# Patient Record
Sex: Male | Born: 1950
Health system: Southern US, Community
[De-identification: ages and names within clinical notes are randomized; demographics above are authoritative.]

## PROBLEM LIST (undated history)

## (undated) DIAGNOSIS — R011 Cardiac murmur, unspecified: Secondary | ICD-10-CM

## (undated) DIAGNOSIS — R0602 Shortness of breath: Secondary | ICD-10-CM

## (undated) DIAGNOSIS — K635 Polyp of colon: Secondary | ICD-10-CM

## (undated) DIAGNOSIS — I340 Nonrheumatic mitral (valve) insufficiency: Secondary | ICD-10-CM

## (undated) DIAGNOSIS — E079 Disorder of thyroid, unspecified: Secondary | ICD-10-CM

## (undated) DIAGNOSIS — D3613 Benign neoplasm of peripheral nerves and autonomic nervous system of lower limb, including hip: Secondary | ICD-10-CM

## (undated) DIAGNOSIS — R42 Dizziness and giddiness: Secondary | ICD-10-CM

## (undated) DIAGNOSIS — R7303 Prediabetes: Secondary | ICD-10-CM

## (undated) DIAGNOSIS — E785 Hyperlipidemia, unspecified: Secondary | ICD-10-CM

## (undated) DIAGNOSIS — T7840XA Allergy, unspecified, initial encounter: Secondary | ICD-10-CM

## (undated) DIAGNOSIS — I499 Cardiac arrhythmia, unspecified: Secondary | ICD-10-CM

## (undated) DIAGNOSIS — C44519 Basal cell carcinoma of skin of other part of trunk: Secondary | ICD-10-CM

## (undated) DIAGNOSIS — R55 Syncope and collapse: Secondary | ICD-10-CM

## (undated) DIAGNOSIS — I4510 Unspecified right bundle-branch block: Secondary | ICD-10-CM

## (undated) DIAGNOSIS — IMO0002 Reserved for concepts with insufficient information to code with codable children: Secondary | ICD-10-CM

## (undated) DIAGNOSIS — G43909 Migraine, unspecified, not intractable, without status migrainosus: Secondary | ICD-10-CM

## (undated) DIAGNOSIS — Z9889 Other specified postprocedural states: Secondary | ICD-10-CM

## (undated) DIAGNOSIS — R0789 Other chest pain: Secondary | ICD-10-CM

## (undated) DIAGNOSIS — I351 Nonrheumatic aortic (valve) insufficiency: Secondary | ICD-10-CM

## (undated) DIAGNOSIS — N400 Enlarged prostate without lower urinary tract symptoms: Secondary | ICD-10-CM

## (undated) DIAGNOSIS — R002 Palpitations: Secondary | ICD-10-CM

## (undated) DIAGNOSIS — I471 Supraventricular tachycardia: Secondary | ICD-10-CM

## (undated) DIAGNOSIS — I519 Heart disease, unspecified: Secondary | ICD-10-CM

## (undated) DIAGNOSIS — R9430 Abnormal result of cardiovascular function study, unspecified: Secondary | ICD-10-CM

## (undated) DIAGNOSIS — I639 Cerebral infarction, unspecified: Secondary | ICD-10-CM

## (undated) DIAGNOSIS — I2510 Atherosclerotic heart disease of native coronary artery without angina pectoris: Secondary | ICD-10-CM

## (undated) HISTORY — DX: Nonrheumatic mitral (valve) insufficiency: I34.0

## (undated) HISTORY — DX: Nonrheumatic aortic (valve) insufficiency: I35.1

## (undated) HISTORY — DX: Hyperlipidemia, unspecified: E78.5

## (undated) HISTORY — DX: Benign neoplasm of peripheral nerves and autonomic nervous system of lower limb, including hip: D36.13

## (undated) HISTORY — DX: Shortness of breath: R06.02

## (undated) HISTORY — DX: Unspecified right bundle-branch block: I45.10

## (undated) HISTORY — DX: Other specified postprocedural states: Z98.890

## (undated) HISTORY — DX: Dizziness and giddiness: R42

## (undated) HISTORY — DX: Abnormal result of cardiovascular function study, unspecified: R94.30

## (undated) HISTORY — DX: Cardiac murmur, unspecified: R01.1

## (undated) HISTORY — DX: Allergy, unspecified, initial encounter: T78.40XA

## (undated) HISTORY — DX: Basal cell carcinoma of skin of other part of trunk: C44.519

## (undated) HISTORY — PX: EYE SURGERY: SHX253

## (undated) HISTORY — DX: Reserved for concepts with insufficient information to code with codable children: IMO0002

## (undated) HISTORY — PX: JOINT REPLACEMENT: SHX530

## (undated) HISTORY — DX: Supraventricular tachycardia: I47.1

## (undated) HISTORY — DX: Migraine, unspecified, not intractable, without status migrainosus: G43.909

## (undated) HISTORY — PX: POLYPECTOMY: SHX149

## (undated) HISTORY — DX: Cerebral infarction, unspecified: I63.9

## (undated) HISTORY — DX: Polyp of colon: K63.5

## (undated) HISTORY — DX: Palpitations: R00.2

## (undated) HISTORY — PX: ORIF ELBOW FRACTURE: SUR928

## (undated) HISTORY — DX: Disorder of thyroid, unspecified: E07.9

## (undated) HISTORY — DX: Benign prostatic hyperplasia without lower urinary tract symptoms: N40.0

## (undated) HISTORY — DX: Other chest pain: R07.89

## (undated) HISTORY — PX: THYROIDECTOMY: SHX17

## (undated) HISTORY — DX: Heart disease, unspecified: I51.9

## (undated) HISTORY — DX: Syncope and collapse: R55

## (undated) HISTORY — PX: OTHER SURGICAL HISTORY: SHX169

## (undated) HISTORY — PX: THYROID SURGERY: SHX805

---
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
## 1996-06-19 ENCOUNTER — Encounter: Payer: Self-pay | Admitting: Family Medicine

## 1996-06-19 LAB — CONVERTED CEMR LAB: RBC count: 5.06 10*6/uL

## 1997-08-27 ENCOUNTER — Encounter: Payer: Self-pay | Admitting: Family Medicine

## 1999-09-04 HISTORY — PX: CORONARY ANGIOPLASTY: SHX604

## 1999-09-23 ENCOUNTER — Ambulatory Visit (HOSPITAL_COMMUNITY): Admission: RE | Admit: 1999-09-23 | Discharge: 1999-09-23 | Payer: Self-pay | Admitting: Cardiology

## 1999-10-12 ENCOUNTER — Ambulatory Visit (HOSPITAL_COMMUNITY): Admission: RE | Admit: 1999-10-12 | Discharge: 1999-10-12 | Payer: Self-pay | Admitting: Internal Medicine

## 2000-01-12 ENCOUNTER — Encounter: Payer: Self-pay | Admitting: Cardiothoracic Surgery

## 2000-01-16 ENCOUNTER — Inpatient Hospital Stay (HOSPITAL_COMMUNITY): Admission: RE | Admit: 2000-01-16 | Discharge: 2000-01-22 | Payer: Self-pay | Admitting: Cardiothoracic Surgery

## 2000-01-16 ENCOUNTER — Encounter: Payer: Self-pay | Admitting: Cardiothoracic Surgery

## 2000-01-16 ENCOUNTER — Encounter (INDEPENDENT_AMBULATORY_CARE_PROVIDER_SITE_OTHER): Payer: Self-pay | Admitting: *Deleted

## 2000-01-17 ENCOUNTER — Encounter: Payer: Self-pay | Admitting: Cardiothoracic Surgery

## 2000-01-18 ENCOUNTER — Encounter: Payer: Self-pay | Admitting: Cardiothoracic Surgery

## 2000-01-21 ENCOUNTER — Encounter: Payer: Self-pay | Admitting: Cardiothoracic Surgery

## 2000-01-22 HISTORY — PX: MITRAL VALVE REPAIR: SHX2039

## 2002-12-04 ENCOUNTER — Encounter: Payer: Self-pay | Admitting: Family Medicine

## 2002-12-04 LAB — CONVERTED CEMR LAB
Blood Glucose, Fasting: 95 mg/dL
RBC count: 5.46 10*6/uL
TSH: 1.78 microintl units/mL
WBC, blood: 6.6 10*3/uL

## 2002-12-19 ENCOUNTER — Ambulatory Visit (HOSPITAL_COMMUNITY): Admission: RE | Admit: 2002-12-19 | Discharge: 2002-12-19 | Payer: Self-pay | Admitting: *Deleted

## 2002-12-19 HISTORY — PX: CORONARY ANGIOPLASTY: SHX604

## 2003-02-19 ENCOUNTER — Encounter: Payer: Self-pay | Admitting: Family Medicine

## 2003-02-19 LAB — CONVERTED CEMR LAB
Blood Glucose, Fasting: 94 mg/dL
PSA: 2 ng/mL

## 2003-06-12 ENCOUNTER — Encounter: Payer: Self-pay | Admitting: Family Medicine

## 2003-06-12 LAB — CONVERTED CEMR LAB
RBC count: 5.31 10*6/uL
WBC, blood: 6.4 10*3/uL

## 2004-06-07 ENCOUNTER — Ambulatory Visit: Payer: Self-pay | Admitting: Cardiology

## 2004-06-14 ENCOUNTER — Ambulatory Visit: Payer: Self-pay | Admitting: Family Medicine

## 2004-06-14 LAB — CONVERTED CEMR LAB: PSA: 1.41 ng/mL

## 2004-07-11 ENCOUNTER — Ambulatory Visit: Payer: Self-pay | Admitting: Family Medicine

## 2004-07-20 ENCOUNTER — Ambulatory Visit: Payer: Self-pay | Admitting: Family Medicine

## 2004-10-01 ENCOUNTER — Emergency Department (HOSPITAL_COMMUNITY): Admission: EM | Admit: 2004-10-01 | Discharge: 2004-10-01 | Payer: Self-pay | Admitting: Emergency Medicine

## 2004-11-08 ENCOUNTER — Ambulatory Visit: Payer: Self-pay | Admitting: Family Medicine

## 2004-11-08 LAB — CONVERTED CEMR LAB
RBC count: 5.01 10*6/uL
WBC, blood: 5.6 10*3/uL

## 2004-11-10 ENCOUNTER — Ambulatory Visit: Payer: Self-pay | Admitting: Family Medicine

## 2004-11-14 ENCOUNTER — Ambulatory Visit: Payer: Self-pay | Admitting: Cardiology

## 2004-11-30 ENCOUNTER — Ambulatory Visit: Payer: Self-pay

## 2004-12-02 ENCOUNTER — Ambulatory Visit: Payer: Self-pay | Admitting: Family Medicine

## 2005-01-11 ENCOUNTER — Ambulatory Visit (HOSPITAL_COMMUNITY): Admission: RE | Admit: 2005-01-11 | Discharge: 2005-01-11 | Payer: Self-pay | Admitting: Surgery

## 2005-01-11 ENCOUNTER — Ambulatory Visit (HOSPITAL_BASED_OUTPATIENT_CLINIC_OR_DEPARTMENT_OTHER): Admission: RE | Admit: 2005-01-11 | Discharge: 2005-01-11 | Payer: Self-pay | Admitting: Surgery

## 2005-01-11 HISTORY — PX: HERNIA REPAIR: SHX51

## 2006-03-06 HISTORY — PX: COLONOSCOPY: SHX174

## 2006-04-26 ENCOUNTER — Ambulatory Visit: Payer: Self-pay | Admitting: Cardiology

## 2006-05-03 ENCOUNTER — Ambulatory Visit: Payer: Self-pay | Admitting: Family Medicine

## 2006-05-10 ENCOUNTER — Ambulatory Visit: Payer: Self-pay | Admitting: Family Medicine

## 2006-05-28 ENCOUNTER — Ambulatory Visit: Payer: Self-pay | Admitting: Family Medicine

## 2006-05-28 LAB — CONVERTED CEMR LAB
ALT: 35 units/L (ref 0–40)
AST: 25 units/L (ref 0–37)
Albumin: 4 g/dL (ref 3.5–5.2)
Alkaline Phosphatase: 49 units/L (ref 39–117)
BUN: 17 mg/dL (ref 6–23)
Basophils Absolute: 0 10*3/uL (ref 0.0–0.1)
Basophils Relative: 0.4 % (ref 0.0–1.0)
Bilirubin, Direct: 0.1 mg/dL (ref 0.0–0.3)
CO2: 29 meq/L (ref 19–32)
Calcium: 9.1 mg/dL (ref 8.4–10.5)
Chloride: 109 meq/L (ref 96–112)
Cholesterol: 134 mg/dL (ref 0–200)
Creatinine, Ser: 1.2 mg/dL (ref 0.4–1.5)
Eosinophils Absolute: 0.2 10*3/uL (ref 0.0–0.6)
Eosinophils Relative: 2.2 % (ref 0.0–5.0)
GFR calc Af Amer: 81 mL/min
GFR calc non Af Amer: 67 mL/min
Glucose, Bld: 91 mg/dL (ref 70–99)
HCT: 40.8 % (ref 39.0–52.0)
HDL: 34.7 mg/dL — ABNORMAL LOW (ref >39.0)
Hemoglobin: 13.9 g/dL (ref 13.0–17.0)
LDL Cholesterol: 77 mg/dL (ref 0–99)
Lymphocytes Relative: 25.3 % (ref 12.0–46.0)
MCHC: 34 g/dL (ref 30.0–36.0)
MCV: 80.1 fL (ref 78.0–100.0)
Monocytes Absolute: 0.7 10*3/uL (ref 0.2–0.7)
Monocytes Relative: 9.9 % (ref 3.0–11.0)
Neutro Abs: 4.3 10*3/uL (ref 1.4–7.7)
Neutrophils Relative %: 62.2 % (ref 43.0–77.0)
PSA: 1.91 ng/mL
PSA: 1.91 ng/mL (ref 0.10–4.00)
Platelets: 313 10*3/uL (ref 150–400)
Potassium: 4.8 meq/L (ref 3.5–5.1)
RBC count: 5.09 10*6/uL
RBC: 5.09 M/uL (ref 4.22–5.81)
RDW: 13.6 % (ref 11.5–14.6)
Sodium: 143 meq/L (ref 135–145)
TSH: 2.08 microintl units/mL
TSH: 2.08 microintl units/mL (ref 0.35–5.50)
Total Bilirubin: 0.7 mg/dL (ref 0.3–1.2)
Total CHOL/HDL Ratio: 3.9
Total Protein: 6.8 g/dL (ref 6.0–8.3)
Triglycerides: 112 mg/dL (ref 0–149)
VLDL: 22 mg/dL (ref 0–40)
WBC, blood: 6.9 10*3/uL
WBC: 6.9 10*3/uL (ref 4.5–10.5)

## 2006-05-30 ENCOUNTER — Ambulatory Visit: Payer: Self-pay | Admitting: Family Medicine

## 2006-06-13 ENCOUNTER — Ambulatory Visit: Payer: Self-pay | Admitting: Gastroenterology

## 2006-06-27 ENCOUNTER — Ambulatory Visit: Payer: Self-pay | Admitting: Gastroenterology

## 2006-07-02 LAB — HM COLONOSCOPY

## 2006-09-24 ENCOUNTER — Telehealth (INDEPENDENT_AMBULATORY_CARE_PROVIDER_SITE_OTHER): Payer: Self-pay | Admitting: *Deleted

## 2006-12-10 ENCOUNTER — Telehealth (INDEPENDENT_AMBULATORY_CARE_PROVIDER_SITE_OTHER): Payer: Self-pay | Admitting: *Deleted

## 2007-05-10 ENCOUNTER — Ambulatory Visit: Payer: Self-pay | Admitting: Family Medicine

## 2007-05-10 ENCOUNTER — Encounter: Payer: Self-pay | Admitting: Family Medicine

## 2007-05-10 DIAGNOSIS — N401 Enlarged prostate with lower urinary tract symptoms: Secondary | ICD-10-CM | POA: Insufficient documentation

## 2007-05-10 DIAGNOSIS — N400 Enlarged prostate without lower urinary tract symptoms: Secondary | ICD-10-CM

## 2007-06-13 ENCOUNTER — Ambulatory Visit: Payer: Self-pay | Admitting: Family Medicine

## 2007-07-23 ENCOUNTER — Ambulatory Visit: Payer: Self-pay | Admitting: Family Medicine

## 2007-08-20 ENCOUNTER — Telehealth: Payer: Self-pay | Admitting: Family Medicine

## 2008-03-06 HISTORY — PX: CARDIAC CATHETERIZATION: SHX172

## 2008-05-13 ENCOUNTER — Telehealth: Payer: Self-pay | Admitting: Family Medicine

## 2008-05-18 ENCOUNTER — Ambulatory Visit: Payer: Self-pay | Admitting: Family Medicine

## 2008-05-18 LAB — CONVERTED CEMR LAB
ALT: 23 units/L (ref 0–53)
Alkaline Phosphatase: 58 units/L (ref 39–117)
BUN: 17 mg/dL (ref 6–23)
Bilirubin, Direct: 0.1 mg/dL (ref 0.0–0.3)
CO2: 29 meq/L (ref 19–32)
Calcium: 8.9 mg/dL (ref 8.4–10.5)
Eosinophils Relative: 2.5 % (ref 0.0–5.0)
Glucose, Bld: 96 mg/dL (ref 70–99)
Hemoglobin: 13.4 g/dL (ref 13.0–17.0)
LDL Cholesterol: 77 mg/dL (ref 0–99)
Lymphocytes Relative: 30.4 % (ref 12.0–46.0)
Monocytes Relative: 9 % (ref 3.0–12.0)
Neutro Abs: 3.4 10*3/uL (ref 1.4–7.7)
Potassium: 4.2 meq/L (ref 3.5–5.1)
RDW: 12.9 % (ref 11.5–14.6)
Sodium: 143 meq/L (ref 135–145)
Total CHOL/HDL Ratio: 4.3
Total Protein: 6.5 g/dL (ref 6.0–8.3)
Triglycerides: 84 mg/dL (ref 0–149)
WBC: 5.7 10*3/uL (ref 4.5–10.5)

## 2008-05-20 ENCOUNTER — Ambulatory Visit: Payer: Self-pay | Admitting: Family Medicine

## 2008-07-28 ENCOUNTER — Telehealth: Payer: Self-pay | Admitting: Family Medicine

## 2008-09-02 ENCOUNTER — Telehealth: Payer: Self-pay | Admitting: Family Medicine

## 2008-09-10 ENCOUNTER — Telehealth: Payer: Self-pay | Admitting: Family Medicine

## 2008-12-13 ENCOUNTER — Encounter: Payer: Self-pay | Admitting: Cardiology

## 2008-12-13 DIAGNOSIS — E785 Hyperlipidemia, unspecified: Secondary | ICD-10-CM | POA: Insufficient documentation

## 2008-12-14 ENCOUNTER — Ambulatory Visit: Payer: Self-pay | Admitting: Cardiology

## 2008-12-14 ENCOUNTER — Ambulatory Visit: Payer: Self-pay | Admitting: Internal Medicine

## 2008-12-14 DIAGNOSIS — R42 Dizziness and giddiness: Secondary | ICD-10-CM | POA: Insufficient documentation

## 2008-12-14 DIAGNOSIS — R0600 Dyspnea, unspecified: Secondary | ICD-10-CM | POA: Insufficient documentation

## 2008-12-14 DIAGNOSIS — R0609 Other forms of dyspnea: Secondary | ICD-10-CM

## 2008-12-18 ENCOUNTER — Telehealth: Payer: Self-pay | Admitting: Cardiology

## 2008-12-24 ENCOUNTER — Encounter: Payer: Self-pay | Admitting: Cardiology

## 2008-12-24 ENCOUNTER — Ambulatory Visit (HOSPITAL_COMMUNITY): Admission: RE | Admit: 2008-12-24 | Discharge: 2008-12-24 | Payer: Self-pay | Admitting: Cardiology

## 2008-12-24 ENCOUNTER — Ambulatory Visit: Payer: Self-pay | Admitting: Cardiology

## 2008-12-24 ENCOUNTER — Ambulatory Visit: Payer: Self-pay

## 2009-01-11 ENCOUNTER — Ambulatory Visit: Payer: Self-pay | Admitting: Family Medicine

## 2009-01-15 ENCOUNTER — Ambulatory Visit: Payer: Self-pay | Admitting: Cardiology

## 2009-05-20 ENCOUNTER — Encounter (INDEPENDENT_AMBULATORY_CARE_PROVIDER_SITE_OTHER): Payer: Self-pay | Admitting: *Deleted

## 2009-05-25 ENCOUNTER — Telehealth: Payer: Self-pay | Admitting: Cardiology

## 2009-06-03 ENCOUNTER — Ambulatory Visit: Payer: Self-pay | Admitting: Family Medicine

## 2009-06-09 ENCOUNTER — Ambulatory Visit: Payer: Self-pay | Admitting: Family Medicine

## 2009-06-21 ENCOUNTER — Ambulatory Visit: Payer: Self-pay | Admitting: Cardiology

## 2009-06-23 ENCOUNTER — Telehealth (INDEPENDENT_AMBULATORY_CARE_PROVIDER_SITE_OTHER): Payer: Self-pay

## 2009-06-24 ENCOUNTER — Ambulatory Visit: Payer: Self-pay | Admitting: Internal Medicine

## 2009-06-24 ENCOUNTER — Encounter: Payer: Self-pay | Admitting: Cardiology

## 2009-06-24 ENCOUNTER — Ambulatory Visit: Payer: Self-pay

## 2009-06-24 ENCOUNTER — Encounter (HOSPITAL_COMMUNITY): Admission: RE | Admit: 2009-06-24 | Discharge: 2009-09-07 | Payer: Self-pay | Admitting: Cardiology

## 2009-07-01 ENCOUNTER — Telehealth: Payer: Self-pay | Admitting: Family Medicine

## 2009-07-04 DIAGNOSIS — R002 Palpitations: Secondary | ICD-10-CM

## 2009-07-04 HISTORY — DX: Palpitations: R00.2

## 2009-07-09 ENCOUNTER — Encounter: Payer: Self-pay | Admitting: Cardiology

## 2009-07-12 ENCOUNTER — Ambulatory Visit: Payer: Self-pay | Admitting: Cardiology

## 2009-07-12 ENCOUNTER — Telehealth: Payer: Self-pay | Admitting: Family Medicine

## 2009-07-12 LAB — CONVERTED CEMR LAB
Basophils Absolute: 0 10*3/uL (ref 0.0–0.1)
CO2: 29 meq/L (ref 19–32)
Calcium: 9.3 mg/dL (ref 8.4–10.5)
Creatinine, Ser: 1 mg/dL (ref 0.4–1.5)
Eosinophils Absolute: 0.1 10*3/uL (ref 0.0–0.7)
Glucose, Bld: 78 mg/dL (ref 70–99)
INR: 1 (ref 0.8–1.0)
Lymphocytes Relative: 25.8 % (ref 12.0–46.0)
MCHC: 33.9 g/dL (ref 30.0–36.0)
Neutro Abs: 3.7 10*3/uL (ref 1.4–7.7)
Neutrophils Relative %: 59.9 % (ref 43.0–77.0)
Platelets: 271 10*3/uL (ref 150.0–400.0)
RDW: 15.3 % — ABNORMAL HIGH (ref 11.5–14.6)

## 2009-07-15 ENCOUNTER — Ambulatory Visit: Payer: Self-pay | Admitting: Cardiovascular Disease

## 2009-07-15 ENCOUNTER — Inpatient Hospital Stay (HOSPITAL_BASED_OUTPATIENT_CLINIC_OR_DEPARTMENT_OTHER): Admission: RE | Admit: 2009-07-15 | Discharge: 2009-07-15 | Payer: Self-pay | Admitting: Cardiovascular Disease

## 2009-07-15 HISTORY — PX: OTHER SURGICAL HISTORY: SHX169

## 2009-07-30 ENCOUNTER — Encounter: Payer: Self-pay | Admitting: Cardiology

## 2009-08-03 ENCOUNTER — Ambulatory Visit: Payer: Self-pay | Admitting: Cardiology

## 2009-08-03 ENCOUNTER — Telehealth: Payer: Self-pay | Admitting: Family Medicine

## 2009-08-31 ENCOUNTER — Telehealth: Payer: Self-pay | Admitting: Family Medicine

## 2009-09-23 ENCOUNTER — Ambulatory Visit: Payer: Self-pay | Admitting: Cardiology

## 2009-09-27 ENCOUNTER — Telehealth: Payer: Self-pay | Admitting: Cardiology

## 2009-10-11 ENCOUNTER — Telehealth: Payer: Self-pay | Admitting: Family Medicine

## 2009-10-12 ENCOUNTER — Encounter (INDEPENDENT_AMBULATORY_CARE_PROVIDER_SITE_OTHER): Payer: Self-pay | Admitting: *Deleted

## 2009-11-24 ENCOUNTER — Ambulatory Visit: Payer: Self-pay | Admitting: Family Medicine

## 2009-11-24 DIAGNOSIS — M25559 Pain in unspecified hip: Secondary | ICD-10-CM | POA: Insufficient documentation

## 2010-04-05 NOTE — Miscellaneous (Signed)
  Clinical Lists Changes  Observations: Added new observation of PAST MED HX:  DYSLIPIDEMIA (ICD-272.4) * THYROID SURGERY * MITRAL VALVE REPAIR.  2001   ..good mitral valve function... echo.. October, 2010 EF 55%... echo... October, 2010  /  nuclear   April, 2011... EF 36%, ??? reliable ??? /  EF 45-50%... catheterization... rated 12, 2011 Positional dizziness... mild... no orthostatic changes by blood pressure check Shortness of breath * ANEURYSMAL DILATATION PROXIMAL LAD  catheterization... 2001  /  repeat catheter 2004  no significant change aneurysmal dilatation of the LAD /  catheterization.. Jul 15 2009... aneurysmal proximal LAD with no change... normal right heart filling pressures and LVEDP.... ejection fraction 45-50% MITRAL REGURGITATION (ICD-396.3) BENIGN PROSTATIC HYPERTROPHY, HX OF (ICD-V13.8) HYPERCHOLESTEROLEMIA (ICD-272.0) Chest discomfort   nuclear June 24, 2009.... suggestion prior small inferior MI with moderate peri-infarct ischemia versus variable diaphragmatic attenuation  (07/30/2009 13:32) Added new observation of PRIMARY MD: Joycelyn Man (07/30/2009 13:32)       Past History:  Past Medical History:  DYSLIPIDEMIA (ICD-272.4) * THYROID SURGERY * MITRAL VALVE REPAIR.  2001   ..good mitral valve function... echo.. October, 2010 EF 55%... echo... October, 2010  /  nuclear   April, 2011... EF 36%, ??? reliable ??? /  EF 45-50%... catheterization... rated 12, 2011 Positional dizziness... mild... no orthostatic changes by blood pressure check Shortness of breath * ANEURYSMAL DILATATION PROXIMAL LAD  catheterization... 2001  /  repeat catheter 2004  no significant change aneurysmal dilatation of the LAD /  catheterization.. Jul 15 2009... aneurysmal proximal LAD with no change... normal right heart filling pressures and LVEDP.... ejection fraction 45-50% MITRAL REGURGITATION (ICD-396.3) BENIGN PROSTATIC HYPERTROPHY, HX OF (ICD-V13.8) HYPERCHOLESTEROLEMIA  (ICD-272.0) Chest discomfort   nuclear June 24, 2009.... suggestion prior small inferior MI with moderate peri-infarct ischemia versus variable diaphragmatic attenuation

## 2010-04-05 NOTE — Letter (Signed)
Summary: Nadara Eaton letter  [place] at [facility]  [address] [place], [place] [address]   Phone: [phone]  Fax: [phone]       10/12/2009 MRN: [id number]  [facility] [address] [place], [place]  [address]  Dear Mr. Damien [facility]usi [facility] [place], and [facility] announce the retirement of Arta Silence, M.D., from full-time practice at the [facility] office effective September 02, 2009 and his plans of returning part-time.  It is important to Dr. Hetty Ely and to our practice that you understand that [facility] has seven physicians in our office for your health care needs.  We will continue to offer the same exceptional care that you have today.    Dr. Hetty Ely has spoken to many of you about his plans for retirement and returning part-time in the fall.   We will continue to work with you through the transition to schedule appointments for you in the office and meet the high standards that [place] is committed to.   Again, it is with great pleasure that we share the news that Dr. Hetty Ely will return to [facility] at [facility] in October of 2011 with a reduced schedule.    If you have any questions, or would like to request an appointment with one of our physicians, please call us at [phone] and press the option for Scheduling an appointment.  We take pleasure in providing you with excellent patient care and look forward to seeing you at your next office visit.  Our [facility] Physicians are:  Tillman Abide, M.D. Laurita Quint, M.D. Roxy Manns, M.D. Kerby Nora, M.D. Hannah Beat, M.D. Ruthe Mannan, M.D. We proudly welcomed Raechel Ache, M.D. and Eustaquio Boyden, M.D. to the practice in July/August 2011.  Sincerely,  [facility]

## 2010-04-05 NOTE — Assessment & Plan Note (Signed)
Summary: f23m      Allergies Added: NKDA  Visit Type:  Follow-up Primary Provider:  Joycelyn Man  CC:  chest pain.  History of Present Illness: The patient is seen for followup shortness of breath and some chest discomfort.  I saw him last in November, 2010.  He has history of mitral valve repair that was very successful and followup 2-D echo in October, 2010, revealed an ejection fraction of 55%.  His valve repair was working well.  Since that time he continues to have some mild shortness of breath and some mild chest discomfort with exertion.  He does have aneurysmal dilatation of the proximal LAD in the past.  Current Medications (verified): 1)  Vytorin 10-80 Mg Tabs (Ezetimibe-Simvastatin) .Marland Kitchen.. 1 At Bedtime Daily By Mouth 2)  Butalbital-Asa-Caffeine 50-325-40 Mg  Caps (Butalbital-Aspirin-Caffeine) .Marland Kitchen.. 1 By Mouth Every 6 Hours As Needed Headache 3)  Flomax 0.4 Mg  Cp24 (Tamsulosin Hcl) .Marland Kitchen.. 1 At Bedtime By Mouth Once Daily 4)  Proscar 5 Mg  Tabs (Finasteride) .... One Tab By Mouth Once Daily 5)  Diclofenac Sodium 75 Mg Tbec (Diclofenac Sodium) .Marland Kitchen.. 1 By Mouth Two Times A Day  Allergies (verified): No Known Drug Allergies  Past History:  Past Medical History:  DYSLIPIDEMIA (ICD-272.4) * THYROID SURGERY * MITRAL VALVE REPAIR...good mitral valve function... echo.. October, 2010 EF 55%... echo... October, 2010 Positional dizziness... mild... no orthostatic changes by blood pressure check Shortness of breath * ANEURYSMAL DILATATION PROXIMAL LAD MITRAL REGURGITATION (ICD-396.3) BENIGN PROSTATIC HYPERTROPHY, HX OF (ICD-V13.8) HYPERCHOLESTEROLEMIA (ICD-272.0) Chest discomfort  Review of Systems       Patient denies fever, chills, headache, sweats, change in vision, change in hearing, cough, nausea vomiting, urinary symptoms, musculoskeletal problems.  All other systems are reviewed and are negative.  Vital Signs:  Patient profile:   60 year old male Height:      71  inches Weight:      211 pounds BMI:     29.53 Pulse rate:   75 / minute BP sitting:   136 / 94  (left arm) Cuff size:   regular  Vitals Entered By: Hardin Negus, RMA (June 21, 2009 4:12 PM)  Physical Exam  General:  patient is stable today Head:  head is atraumatic. Eyes:  no xanthelasma. Neck:  no jugular venous distention. Chest Wall:  no chest wall tenderness. Lungs:  lungs are clear.  Respiratory effort is nonlabored. Heart:  cardiac exam reveals S1-S2.  There no clicks or significant murmurs. Abdomen:  abdomen is soft. Msk:  no musculoskeletal deformities. Extremities:  no peripheral edema. Skin:  no skin rashes. Psych:  patient is oriented to person time and place.  Affect is normal.   Impression & Recommendations:  Problem # 1:  CHEST DISCOMFORT (ICD-786.59)  The patient has had some mild chest discomfort.  He also has had some shortness of breath.  We know that his LV function is good and his mitral valve function is good from his recent echo.  It is now time to proceed with a stress study to be sure that there is no sign of significant ischemia.  This will be arranged.  Orders: Nuclear Stress Test (Nuc Stress Test)  Problem # 2:  PALPITATIONS (ICD-785.1) The patient has had some mild palpitations.  At the time of his stress test we will see if he has any stress-induced ectopy.  He does not need to wear a Holter monitor.  Problem # 3:  POSTURAL LIGHTHEADEDNESS (ICD-780.4) He has  not been having much of this problem.  No further workup.  Problem # 4:  * MITRAL VALVE REPAIR We know that his valve is working well.  No further workup now.  Patient Instructions: 1)  Your physician has requested that you have an exercise stress myoview.  For further information please visit [email or web address]  Please follow instruction sheet, as given. 2)  Follow up 3-4 weeks

## 2010-04-05 NOTE — Progress Notes (Signed)
Summary: set up for stress test   Phone Note Call from Patient Call back at Home Phone [phone] Call back at Work Phone [phone]   Caller: Patient Reason for Call: Talk to Nurse Summary of Call: pt is aware that he suppose to be set up for stress test in may. following his last office visit, Initial call taken by: Lorne Skeens,  May 25, 2009 9:29 AM  Follow-up for Phone Call        spoke w/pt, sch f/u appt w/Dr Myrtis Ser for 4/18  Follow-up by: Meredith Staggers, RN,  May 25, 2009 10:37 AM

## 2010-04-05 NOTE — Progress Notes (Signed)
Summary: Rx Flomax  Phone Note Refill Request Call back at [phone] Message from:  [facility] on October 11, 2009 5:26 PM  Refills Requested: Medication #1:  FLOMAX 0.4 MG  CP24 1 at bedtime by mouth once daily No last refill date sent   Method Requested: Electronic Initial call taken by: Sydell Axon LPN,  October 11, 2009 5:27 PM    Prescriptions: FLOMAX 0.4 MG  CP24 (TAMSULOSIN HCL) 1 at bedtime by mouth once daily  #90 x 3   Entered and Authorized by:   Crawford Givens MD   Signed by:   Crawford Givens MD on 10/11/2009   Method used:   Electronically to        [facility] (retail)       [address]       [place], [place]  [address]       Ph: [phone] or [phone]       Fax: [phone]   RxID:   [id number]

## 2010-04-05 NOTE — Progress Notes (Signed)
Summary: Rx Vytorin  Phone Note Refill Request Call back at [phone] Message from:  [facility] on Aug 03, 2009 12:41 PM  Refills Requested: Medication #1:  VYTORIN 10-80 MG TABS 1 at bedtime daily by mouth Last refill request on 06/30/09 message was sent to patient that he needed lab work and appt to be seen.  No appointments scheduled. Please advise.  Initial call taken by: Sydell Axon LPN,  Aug 03, 2009 12:42 PM    Prescriptions: VYTORIN 10-80 MG TABS (EZETIMIBE-SIMVASTATIN) 1 at bedtime daily by mouth  #30 Tablet x 0   Entered and Authorized by:   Shaune Leeks MD   Signed by:   Shaune Leeks MD on 08/03/2009   Method used:   [facility]        [facility] (retail)       [address]       [place], [place]  [address]       Ph: [phone] or [phone]       Fax: [phone]   RxID:   [id number]  Try same message one more time please. Shaune Leeks MD  Aug 03, 2009 1:05 PM   Spoke to pharmacist and requested that he advised the patient that he needs to call the office for a lab appt and appt to see the doctor. Pharmacist stated that he will put a note on the patient's rx.

## 2010-04-05 NOTE — Letter (Signed)
Summary: Cardiac Catheterization Instructions- [facility]  [facility], Main Office  [address]   [place], [place] [address]   Phone: [phone]  Fax: [phone]     07/12/2009 MRN: [id number]  [facility] [address] [place], [place]  [address]  Dear Mr. ABDALLAH,   You are scheduled for a Cardiac Catheterization on Thursday 07/15/09 with Dr. Excell Seltzer  Please arrive to the 1st floor of the Heart and Vascular Center at [facility] at 9:00 am / pm on the day of your procedure. Please do not arrive before 6:30 a.m. Call the Heart and Vascular Center at [phone] if you are unable to make your appointmnet. The Code to get into the parking garage under the building is 0010. Take the elevators to the 1st floor. You must have someone to drive you home. Someone must be with you for the first 24 hours after you arrive home. Please wear clothes that are easy to get on and off and wear slip-on shoes. Do not eat or drink after midnight except water with your medications that morning. Bring all your medications and current insurance cards with you.  ___ DO NOT take these medications before your procedure: ________________________________________________________________  ___ Make sure you take your aspirin.  _X__ You may take ALL of your medications with water that morning. ________________________________________________________________________________________________________________________________  ___ DO NOT take ANY medications before your procedure.  ___ Pre-med instructions:  ________________________________________________________________________________________________________________________________  The usual length of stay after your procedure is 2 to 3 hours. This can vary.  If you have any questions, please call the office at the number listed above.   Meredith Staggers, RN

## 2010-04-05 NOTE — Assessment & Plan Note (Signed)
Summary: [facility] F/U/SL  Medications Added DICLOFENAC SODIUM 75 MG TBEC (DICLOFENAC SODIUM) 1 by mouth two times a day as needed CARVEDILOL 6.25 MG TABS (CARVEDILOL) Take one tablet by mouth twice a day      Allergies Added: NKDA  Visit Type:  Follow-up Primary Provider:  Joycelyn Man  CC:  LV dysfunction.  History of Present Illness: The patient is seen for followup of palpitations and left ventricular dysfunction.  I saw him last Aug 03, 2009.  At that time he was feeling relatively well.  We had finished a very careful assessment of his coronaries in his LV function.  Catheterization had shown nonobstructive disease.  There was mild aneurysmal dilatation of the proximal LAD it was old.  For his palpitations I started him on a small dose of carvedilol.  He feels much better.  Current Medications (verified): 1)  Vytorin 10-80 Mg Tabs (Ezetimibe-Simvastatin) .Marland Kitchen.. 1 At Bedtime Daily By Mouth 2)  Butalbital-Asa-Caffeine 50-325-40 Mg  Caps (Butalbital-Aspirin-Caffeine) .Marland Kitchen.. 1 By Mouth Every 6 Hours As Needed Headache 3)  Flomax 0.4 Mg  Cp24 (Tamsulosin Hcl) .Marland Kitchen.. 1 At Bedtime By Mouth Once Daily 4)  Proscar 5 Mg  Tabs (Finasteride) .... One Tab By Mouth Once Daily 5)  Diclofenac Sodium 75 Mg Tbec (Diclofenac Sodium) .Marland Kitchen.. 1 By Mouth Two Times A Day As Needed 6)  Fish Oil   Oil (Fish Oil) .... Daily  (1200mg ) 7)  Red Yeast Inabinet   Powd (Red Yeast Uram Extract) .... Once Daily 8)  Carvedilol 3.125 Mg Tabs (Carvedilol) .... Take One Tablet By Mouth Twice A Day  Allergies (verified): No Known Drug Allergies  Past History:  Past Medical History:  DYSLIPIDEMIA (ICD-272.4) * THYROID SURGERY * MITRAL VALVE REPAIR.  2001   ..good mitral valve function... echo.. October, 2010 EF 55%... echo... October, 2010  /  nuclear   April, 2011... EF 36%, ??? reliable ??? /  EF 45-50%... catheterization... rated 12, 2011 Positional dizziness... mild... no orthostatic changes by blood pressure  check Shortness of breath * ANEURYSMAL DILATATION PROXIMAL LAD  catheterization... 2001  /  repeat catheter 2004  no significant change aneurysmal dilatation of the LAD /  catheterization.. Jul 15 2009... aneurysmal proximal LAD with no change... normal right heart filling pressures and LVEDP.... ejection fraction 45-50% MITRAL REGURGITATION (ICD-396.3) BENIGN PROSTATIC HYPERTROPHY, HX OF (ICD-V13.8) HYPERCHOLESTEROLEMIA (ICD-272.0) Chest discomfort   nuclear June 24, 2009.... suggestion prior small inferior MI with moderate peri-infarct ischemia versus variable diaphragmatic attenuation Palpitations    helped with carvedilol.... May, 2011  Review of Systems       Patient denies fever, chills, headache, sweats, rash, change in vision, change in hearing, chest pain, cough, nausea vomiting, urinary symptoms.  All other systems are reviewed and are negative  Vital Signs:  Patient profile:   60 year old male Height:      71 inches Weight:      210 pounds BMI:     29.39 Pulse rate:   74 / minute BP sitting:   108 / 64  (left arm) Cuff size:   regular  Vitals Entered By: Hardin Negus, RMA (September 23, 2009 9:13 AM)  Physical Exam  General:  patient is quite stable today. Eyes:  no xanthelasma. Neck:  no jugular venous distention. Lungs:  lungs are clear respiratory effort is nonlabored. Heart:  cardiac exam reveals S1-S2 present no clicks or significant murmurs Abdomen:  abdomen is soft. Extremities:  no peripheral edema. Psych:  patient is oriented  to person time and place.  Affect is normal.   Impression & Recommendations:  Problem # 1:  * EF 45-50% Carvedilol has been started.  Will make a small increased to 6.25 b.i.d.  His rate is relatively slow already.  I plan not to try to push him up to 25 b.i.d.  Over time I will consider whether it is appropriate to add an ACE inhibitor or not.  Problem # 2:  CHEST DISCOMFORT (ICD-786.59) He's had no recurrent chest pain.  No further  workup.  Problem # 3:  PALPITATIONS (ICD-785.1) His palpitations are definitely helped with a small dose of carvedilol.  This is to be continued.  Problem # 4:  SHORTNESS OF BREATH (ICD-786.05) Shortness of breath is improved.  No further workup.   Patient Instructions: 1)  Increase Carvedilol to 6.25mg  two times a day  2)  Your physician wants you to follow-up in:  6 months.  You will receive a reminder letter in the mail two months in advance. If you don't receive a letter, please call our office to schedule the follow-up appointment. Prescriptions: CARVEDILOL 6.25 MG TABS (CARVEDILOL) Take one tablet by mouth twice a day  #30 x 6   Entered by:   Meredith Staggers, RN   Authorized by:   Talitha Givens, MD, [facility]   Signed by:   Meredith Staggers, RN on 09/23/2009   Method used:   [facility]        [facility] (retail)       [address]       [place], [place]  [address]       Ph: [phone] or [phone]       Fax: [phone]   RxID:   [id number]

## 2010-04-05 NOTE — Miscellaneous (Signed)
  Clinical Lists Changes  Observations: Added new observation of PAST MED HX:  DYSLIPIDEMIA (ICD-272.4) * THYROID SURGERY * MITRAL VALVE REPAIR...good mitral valve function... echo.. October, 2010 EF 55%... echo... October, 2010  /  nuclear   April, 2011... EF 36%, ??? reliable ??? Positional dizziness... mild... no orthostatic changes by blood pressure check Shortness of breath * ANEURYSMAL DILATATION PROXIMAL LAD MITRAL REGURGITATION (ICD-396.3) BENIGN PROSTATIC HYPERTROPHY, HX OF (ICD-V13.8) HYPERCHOLESTEROLEMIA (ICD-272.0) Chest discomfort   nuclear June 24, 2009.... suggestion prior small inferior MI with moderate peri-infarct ischemia versus variable diaphragmatic attenuation  (07/09/2009 9:52) Added new observation of PRIMARY MD: Joycelyn Man (07/09/2009 9:52)       Past History:  Past Medical History:  DYSLIPIDEMIA (ICD-272.4) * THYROID SURGERY * MITRAL VALVE REPAIR...good mitral valve function... echo.. October, 2010 EF 55%... echo... October, 2010  /  nuclear   April, 2011... EF 36%, ??? reliable ??? Positional dizziness... mild... no orthostatic changes by blood pressure check Shortness of breath * ANEURYSMAL DILATATION PROXIMAL LAD MITRAL REGURGITATION (ICD-396.3) BENIGN PROSTATIC HYPERTROPHY, HX OF (ICD-V13.8) HYPERCHOLESTEROLEMIA (ICD-272.0) Chest discomfort   nuclear June 24, 2009.... suggestion prior small inferior MI with moderate peri-infarct ischemia versus variable diaphragmatic attenuation

## 2010-04-05 NOTE — Assessment & Plan Note (Signed)
Summary: 3wk f/u sl  Medications Added DICLOFENAC SODIUM 75 MG TBEC (DICLOFENAC SODIUM) 1 by mouth two times a day as needed CARVEDILOL 3.125 MG TABS (CARVEDILOL) Take one tablet by mouth twice a day      Allergies Added: NKDA  Visit Type:  Follow-up Primary Provider:  Joycelyn Man  CC:  LV dysfunction.  History of Present Illness: The patient is seen for followup.  He is status post mitral valve repair in the past.  He has some aneurysmal dilatation of his LAD.  He said some chest discomfort and fatigue.  Ejection fraction had been estimated in the 50% range.  With his recent nuclear scan there was question of a lower ejection fraction.  With all issues cardiac catheterization was arranged.  Study was done Jul 15, 2009.  There was aneurysmal dilatation it was mild of the proximal LAD.  It was unchanged in the past.  Right heart catheter showed normal right heart filling pressures and LVEDP.  His ejection fraction appeared to be in the 45-50% range.  Patient is feeling relatively well at this time.  I have reviewed completely the catheter report.  I had spoken previously with Dr. Excell Seltzer about this.  I explained the issue of mild left ventricular dysfunction the patient.  He says that with exercise he does feel some rare palpitations.  He had mentioned this in the past. At the time of his nuclear scan he had some PVCs.  Current Medications (verified): 1)  Vytorin 10-80 Mg Tabs (Ezetimibe-Simvastatin) .Marland Kitchen.. 1 At Bedtime Daily By Mouth 2)  Butalbital-Asa-Caffeine 50-325-40 Mg  Caps (Butalbital-Aspirin-Caffeine) .Marland Kitchen.. 1 By Mouth Every 6 Hours As Needed Headache 3)  Flomax 0.4 Mg  Cp24 (Tamsulosin Hcl) .Marland Kitchen.. 1 At Bedtime By Mouth Once Daily 4)  Proscar 5 Mg  Tabs (Finasteride) .... One Tab By Mouth Once Daily 5)  Diclofenac Sodium 75 Mg Tbec (Diclofenac Sodium) .Marland Kitchen.. 1 By Mouth Two Times A Day As Needed 6)  Fish Oil   Oil (Fish Oil) .... Daily  (1200mg ) 7)  Red Yeast Face   Powd (Red Yeast  Senger Extract) .... Once Daily  Allergies (verified): No Known Drug Allergies  Past History:  Past Medical History:  DYSLIPIDEMIA (ICD-272.4) * THYROID SURGERY * MITRAL VALVE REPAIR.  2001   ..good mitral valve function... echo.. October, 2010 EF 55%... echo... October, 2010  /  nuclear   April, 2011... EF 36%, ??? reliable ??? /  EF 45-50%... catheterization... rated 12, 2011 Positional dizziness... mild... no orthostatic changes by blood pressure check Shortness of breath * ANEURYSMAL DILATATION PROXIMAL LAD  catheterization... 2001  /  repeat catheter 2004  no significant change aneurysmal dilatation of the LAD /  catheterization.. Jul 15 2009... aneurysmal proximal LAD with no change... normal right heart filling pressures and LVEDP.... ejection fraction 45-50% MITRAL REGURGITATION (ICD-396.3) BENIGN PROSTATIC HYPERTROPHY, HX OF (ICD-V13.8) HYPERCHOLESTEROLEMIA (ICD-272.0) Chest discomfort   nuclear June 24, 2009.... suggestion prior small inferior MI with moderate peri-infarct ischemia versus variable diaphragmatic attenuation Palpitations   Review of Systems       Patient denies fever, chills, headache, sweats, rash, change in vision, change in hearing, chest pain, cough, nausea vomiting, urinary symptoms.  All other systems are reviewed and are negative.  Vital Signs:  Patient profile:   60 year old male Weight:      212 pounds Pulse rate:   64 / minute BP sitting:   120 / 66  (left arm)  Vitals Entered By: Meredith Staggers,  RN (Aug 03, 2009 8:33 AM)  Physical Exam  General:  patient is stable. Head:  head is atraumatic. Eyes:  no xanthelasma. Neck:  no jugular venous distention. Chest Wall:  no chest wall tenderness. Lungs:  lungs are clear.  Respiratory effort is nonlabored. Heart:  cardiac exam reveals S1 and S2.  No clicks or significant murmurs. Abdomen:  abdomen soft. Msk:  no musculoskeletal deformities. Extremities:  no peripheral edema. Skin:  no skin  rashes. Psych:  patient is oriented to person time and place.  Affect is normal.   Impression & Recommendations:  Problem # 1:  CHEST DISCOMFORT (ICD-786.59)  The patient has not had any further chest discomfort.  Cardiac catheterization has shown no obstructive disease.  He does have some mild aneurysmal dilatation of his proximal LAD that is old.  He does not need further ischemic workup.  His updated medication list for this problem includes:    Carvedilol 3.125 Mg Tabs (Carvedilol) .Marland Kitchen... Take one tablet by mouth twice a day  Problem # 2:  PALPITATIONS (ICD-785.1)  We know from exercise test the patient had some rare PVCs.  He notes some mild palpitations with exercise.  I will be starting a beta blocker for his LV dysfunction.  This may help with this issue.  His updated medication list for this problem includes:    Carvedilol 3.125 Mg Tabs (Carvedilol) .Marland Kitchen... Take one tablet by mouth twice a day  Problem # 3:  SHORTNESS OF BREATH (ICD-786.05)  Is not having any significant shortness of breath at this time.  No further workup.  His updated medication list for this problem includes:    Carvedilol 3.125 Mg Tabs (Carvedilol) .Marland Kitchen... Take one tablet by mouth twice a day  Problem # 4:  * MITRAL VALVE REPAIR Mitral valve repair continues to work well.  No further workup now.  Problem # 5:  * ANEURYSMAL DILATATION PROXIMAL LAD This issue is unchanged as noted from his recent catheter data.  No further workup.  Problem # 6:  * EF 45-50% As outlined in the work up recently there has been very a shunt in the assessment of his LV function.  Considering all that I will believe that his EF is in the range of 45-50%.  I have reviewed this with him and discuss the issues of medications for left ventricular dysfunction.  He really has only slight decrease in LV function.  I would like to have him on a low-dose of beta blocker if possible.  We will start him on carvedilol 3.125 b.i.d.  It is  possible that this may help him with some of his exercise PVCs.  Because he's had some fatigue recently we'll be sure not to make his fatigue worse with beta blockade.  I will see him for followup.  Patient Instructions: 1)  Carvedilol 3.125mg  two times a day  2)  Follow up in 8 weeks Prescriptions: CARVEDILOL 3.125 MG TABS (CARVEDILOL) Take one tablet by mouth twice a day  #60 x 6   Entered by:   Meredith Staggers, RN   Authorized by:   Talitha Givens, MD, [facility]   Signed by:   Meredith Staggers, RN on 08/03/2009   Method used:   [facility]        [facility] [facility]ail)       [address]       [place], [place]  [address]       Ph: [phone] or [phone]  Fax: [phone]   RxID:   [id number]

## 2010-04-05 NOTE — Progress Notes (Signed)
Summary: Nuc. Pre-Procedure  Phone Note Outgoing Call Call back at [facility] Phone [phone]   Call placed by: Irean Hong, RN,  June 23, 2009 3:49 PM Summary of Call: Left message with information on My[employer] Information Sheet (see scanned document for details).      Nuclear Med Background Indications for Stress Test: Evaluation for Ischemia   History: Echo, Heart Catheterization, Myocardial Perfusion Study, MVP  History Comments: 11/01 MVR with anuloplasty ring. 10/04 MPS>? some inferior ischemia, EF=45%> cath: no significant CAD, EF=40-45%, Mild pulmonary hypertension. 10/10 Echo: EF=55%, mild LVH.  Symptoms: Chest Pain with Exertion, Dizziness, DOE, Light-Headedness, Palpitations, SOB    Nuclear Pre-Procedure Cardiac Risk Factors: Family History - CAD, Lipids Height (in): 71

## 2010-04-05 NOTE — Progress Notes (Signed)
Summary: Rx Proscar  Phone Note Refill Request Call back at [phone] Message [facility]:  [facility] on August 31, 2009 7:54 AM  Refills Requested: Medication #1:  PROSCAR 5 MG  TABS one tab by mouth once daily  Method Requested: Electronic Initial call taken by: Sydell Axon LPN,  August 31, 2009 7:55 AM    Prescriptions: PROSCAR 5 MG  TABS (FINASTERIDE) one tab by mouth once daily  #30 Tablet x 12   Entered and Authorized by:   Shaune Leeks MD   Signed by:   Shaune Leeks MD on 08/31/2009   Method used:   [facility]        [facility] (retail)       [address]       [place], [place]  [address]       Ph: [phone] or [phone]       Fax: [phone]   RxID:   [id number]

## 2010-04-05 NOTE — Assessment & Plan Note (Signed)
Summary: 3wk f/u sl  Medications Added FISH OIL   OIL (FISH OIL) daily  (1200mg ) RED YEAST [place]   POWD (RED YEAST [place] EXTRACT) once daily      Allergies Added: NKDA  Visit Type:  Follow-up Primary Provider:  Joycelyn Man  CC:  CAD / shortness of breath.  History of Present Illness: Patient is seen for followup CAD and shortness of breath. He underwent mitral valve repair including a mitral ring in 2001.  He's done very well.  His recent echo revealed very good function of his valve in October, 2010.  He's had some chest discomfort and some shortness of breath.  The echo in October, 2010 revealed an ejection fraction of 55% and the mitral valve moved well.  He had a stress nuclear scan June 24, 2009. the study suggested the possibility of a small inferior MI with peri-infarct ischemia.  It is possible that this could also have been variable diaphragmatic attenuation.  The patient continues to have some vague chest discomfort and shortness of breath.  He is concerned about this.  The nuclear study also raise the question of ejection fraction 36%.  This does not fit with the echo of October, 2010.  Current Medications (verified): 1)  Vytorin 10-80 Mg Tabs (Ezetimibe-Simvastatin) .Marland Kitchen.. 1 At Bedtime Daily By Mouth 2)  Butalbital-Asa-Caffeine 50-325-40 Mg  Caps (Butalbital-Aspirin-Caffeine) .Marland Kitchen.. 1 By Mouth Every 6 Hours As Needed Headache 3)  Flomax 0.4 Mg  Cp24 (Tamsulosin Hcl) .Marland Kitchen.. 1 At Bedtime By Mouth Once Daily 4)  Proscar 5 Mg  Tabs (Finasteride) .... One Tab By Mouth Once Daily 5)  Diclofenac Sodium 75 Mg Tbec (Diclofenac Sodium) .Marland Kitchen.. 1 By Mouth Two Times A Day 6)  Fish Oil   Oil (Fish Oil) .... Daily  (1200mg ) 7)  Red Yeast [place]   Powd (Red Yeast Strain Extract) .... Once Daily  Allergies (verified): No Known Drug Allergies  Past History:  Past Medical History:  DYSLIPIDEMIA (ICD-272.4) * THYROID SURGERY * MITRAL VALVE REPAIR.  2001   ..good mitral valve function... echo..  October, 2010 EF 55%... echo... October, 2010  /  nuclear   April, 2011... EF 36%, ??? reliable ??? Positional dizziness... mild... no orthostatic changes by blood pressure check Shortness of breath * ANEURYSMAL DILATATION PROXIMAL LAD  catheterization... 2001  /  repeat catheter 2004  no significant change aneurysmal dilatation of the LAD MITRAL REGURGITATION (ICD-396.3) BENIGN PROSTATIC HYPERTROPHY, HX OF (ICD-V13.8) HYPERCHOLESTEROLEMIA (ICD-272.0) Chest discomfort   nuclear June 24, 2009.... suggestion prior small inferior MI with moderate peri-infarct ischemia versus variable diaphragmatic attenuation  Review of Systems       Patient denies fever, chills, headache, sweats, rash, change in vision, change in hearing, chest pain, cough, nausea vomiting, urinary symptoms.  All other systems are reviewed and are negative.  Vital Signs:  Patient profile:   60 year old male Height:      71 inches Weight:      211 pounds BMI:     29.53 Pulse rate:   65 / minute BP sitting:   138 / 66  (left arm) Cuff size:   regular  Vitals Entered By: Hardin Negus, RMA (Jul 12, 2009 8:35 AM)  Physical Exam  General:  patient is stable today. Head:  head is atraumatic. Eyes:  no xanthelasma. Neck:  no jugular venous distention. Chest Wall:  no chest wall tenderness. Lungs:  lungs are clear.  Respiratory effort is nonlabored. Heart:  cardiac exam reveals S1 and  S2.  There no clicks or significant murmurs. Abdomen:  abdomen is soft. Msk:  no musculoskeletal deformities. Extremities:  no peripheral edema. Skin:  no skin rashes. Psych:  patient is oriented to person time and place.  Affect is normal.   Impression & Recommendations:  Problem # 1:  PALPITATIONS (ICD-785.1) The patient is not having significant palpitations at this time.  Problem # 2:  POSTURAL LIGHTHEADEDNESS (ICD-780.4) This problem is under control.  No significant symptoms.  Problem # 3:  * MITRAL VALVE REPAIR We know  from the echo of October, 2010, and the patient's mitral valve is working well.  No further workup of the valve as needed.  Problem # 4:  * ANEURYSMAL DILATATION PROXIMAL LAD There is history of aneurysmal dilatation of the LAD.  As outlined this had been restudied in 2004 and had not changed.  We will obtain more information from his upcoming catheter.  Problem # 5:  SHORTNESS OF BREATH (ICD-786.05) With ongoing mild shortness of breath and some mild chest discomfort nuclear study was done.  There is question of ischemia in the inferior wall.  In addition we know he had old aneurysmal dilatation of the LAD.  There is also question of decreased ejection fraction from the nuclear scan.  Considering all this information, outpatient cardiac catheterization will be arranged. At the time of his echo in October, 2010, there was no significant tricuspid regurgitation.  Therefore right heart pressures could not be estimated.  Because shortness of breath as part of the issue the patient will also have a right heart catheter at the time of his last heart catheter and coronaries.  I had a careful discussion with the patient about this approach.  I also reviewed many of his old records.  Other Orders: TLB-BMP (Basic Metabolic Panel-BMET) (80048-METABOL) TLB-CBC Platelet - w/Differential (85025-CBCD) TLB-PT (Protime) (85610-PTP) Cardiac Catheterization (Cardiac Cath)  Patient Instructions: 1)  Labs today 2)  Your physician has requested that you have a cardiac catheterization.  Cardiac catheterization is used to diagnose and/or treat various heart conditions. Doctors may recommend this procedure for a number of different reasons. The most common reason is to evaluate chest pain. Chest pain can be a symptom of coronary artery disease (CAD), and cardiac catheterization can show whether plaque is narrowing or blocking your heart's arteries. This procedure is also used to evaluate the valves, as well as measure the  blood flow and oxygen levels in different parts of your heart.  For further information please visit [email or web address]  Please follow instruction sheet, as given. 3)  Follow up in 3 weeks

## 2010-04-05 NOTE — Progress Notes (Signed)
Summary: regarding flomax  Phone Note From Pharmacy   [facility] [id number] Summary of Call: Pharmacy states they are unable to get brand name flomax and are asking if they can dispense generic.  Please advise. Initial call taken by: Lowella Petties CMA,  July 01, 2009 3:28 PM  Follow-up for Phone Call        OK with me. Pls let pt know. Follow-up by: Shaune Leeks MD,  July 01, 2009 3:55 PM  Additional Follow-up for Phone Call Additional follow up Details #1::        Advised pharmacist, he will call the patient. Additional Follow-up by: Lowella Petties CMA,  July 01, 2009 5:01 PM

## 2010-04-05 NOTE — Assessment & Plan Note (Signed)
Summary: PER DR SCHALLER BUTTOCK PAIN/OK PER DR Guneet Delpino/[facility]   Vital Signs:  Patient profile:   60 year old male Height:      71 inches Weight:      212.2 pounds BMI:     29.70 Temp:     97.9 degrees F oral Pulse rate:   80 / minute Pulse rhythm:   regular BP sitting:   140 / 80  (left arm) Cuff size:   regular  Vitals Entered By: Benny Lennert CMA Duncan Dull) (June 09, 2009 3:44 PM)  History of Present Illness: Chief complaint pain in buttocls per dr schaller  60 year old with left hip pain ongoing for the last few weeks:  Some pain in the lateral buttocks and some pain in the anterior hip rarely. no radiculopathy no back pain no groin pain  Ice pack not helping.  Has done nothing that he can think of.  Has been working for the last twenty or so years, has been driving his own car.  Driving a pontiac vibe with a bucket seat  Taking ibuprofen 800 mg by mouth three times a day      Allergies (verified): No Known Drug Allergies  Past History:  Past medical, surgical, family and social histories (including risk factors) reviewed, and no changes noted (except as noted below).  Past Medical History: Reviewed history from 01/15/2009 and no changes required.  DYSLIPIDEMIA (ICD-272.4) * THYROID SURGERY * MITRAL VALVE REPAIR...good mitral valve function... echo.. October, 2010 EF 55%... echo... October, 2010 Positional dizziness... mild... no orthostatic changes by blood pressure check Shortness of breath * ANEURYSMAL DILATATION PROXIMAL LAD MITRAL REGURGITATION (ICD-396.3) BENIGN PROSTATIC HYPERTROPHY, HX OF (ICD-V13.8) HYPERCHOLESTEROLEMIA (ICD-272.0)  Past Surgical History: Reviewed history from 05/20/2008 and no changes required. L ELBOW ORIF 1965 APPENDECTOMY 1970 L THYROIDECTOMY DUE TO GOITER 1994 STRESS CARDIOLITE-- MILD INFEROLATERAL ISCHEMIA:(09/15/1999) ECHO-- MVP ; MILD AI :(09/1999) CATH. SEVERE MR O/W WITHIN NORMAL LIMITS:(09/1999) TEE MITRAL VALVUE  ANGIOPLASTY:(01/22/2000) CATH . + CAD , MILD LV DYSFUN. -- MED. TREATMENT:(12/19/2002) STTRESS CARDIOLITE -- INF ISCHEMIA--EVALUATION:(12/12/2002) ECHO EF 55% :(12/11/2002) ECHO NO CHANGE EF NORMAL  ; TR; A.R.; T.R.:(11/30/2004) LEFT HERNIA REPAIR DR. MARTIN:(01/11/2005) COLONOSCOPY POLYPS--(07/02/2006) REPEAT IN 10 YEARS  Family History: Reviewed history from 05/20/2008 and no changes required. Father:ALIVE  87  LEAKING MITRAL VALVE  ARTHRITIS BPH Mother: DECEASED AT 43 YOA - ALZHEIMERS; CHF BROTHER  A 54 Heaqrt Murmur CV: +AUNT DECEASED FROM MI HBP: NEGATIVE DM: NEGATIVE GOUT/ARTHRITIS: + FATHER ARTHRITIS PROSTATE CANCER: POSITIVE GF AT 55 YOA BREAT/OVARIAN/UTERINE CANCER:  COLON CANCER NEGATIVE // UNCLE LUNG CANCER:- SMOKER DEPRESSION: NEGATIVE ETOH/DRUG ABUSE: NEGATIVE OTHER: NEGATIVE STROKE  Social History: Reviewed history from 05/20/2008 and no changes required. Marital Status: Married  WIDOWED WIFE DECEASED COLON CANCER:  08-04-96//REMARRIED 1999-08-05 lives w/ remarried wife Children: 2 CHILDREN Occupation: Printmaker  Review of Systems       REVIEW OF SYSTEMS  GEN: No systemic complaints, no fevers, chills, sweats, or other acute illnesses MSK: Detailed in the HPI GI: tolerating PO intake without difficulty Neuro: No numbness, parasthesias, or tingling associated. Otherwise the pertinent positives of the ROS are noted above.    Physical Exam  General:  GEN: Well-developed,well-nourished,in no acute distress; alert,appropriate and cooperative throughout examination HEENT: Normocephalic and atraumatic without obvious abnormalities. No apparent alopecia or balding. Ears, externally no deformities PULM: Breathing comfortably in no respiratory distress EXT: No clubbing, cyanosis, or edema PSYCH: Normally interactive. Cooperative during the interview. Pleasant. Friendly and conversant. Not anxious  or depressed appearing. Normal, full affect.  Msk:  HIP EXAM: SIDE:  L ROM: Abduction, Flexion, Internal and External range of motion: Full Pain with terminal IROM and EROM: no GTB: NT SLR: NEG Knees: No effusion FABER: NT REVERSE FABER: POSITIVE ON THE LEFT Piriformis: TENDER, AND TENDER JUST CAUDAL AROUND GLUTE MINIMUS Str: flexion: 5/5 abduction: 5/5 adduction: 5/5 Strength testing non-tender    Impression & Recommendations:  Problem # 1:  OTHER DISORDER OF COCCYX/ LEFT BUTTOCK (ICD-724.79) Excellent ROM in both hips, no terminal ROM pain, negative "c sign", no groin pain - do not think intraarticular.  Focuses to the piriformis L, and glute minimus   I reviewed the relevent anatomy with the patient using [employer]'s Orthopaedic Atlas of Advice worker. The patient was given a handout from Dr. Ailene Ards book "The Sports Medicine Patient Advisor" describing the anatomy and rehabilitation of the following condition: Piriformis Syndrome  Also given a handout with more extensive stretching, hip flexor and abductor strengthening, ham stretching, hip turn outs  Rec deep massage, explained self-massage with ball   cc: Dr. Hetty Ely  Complete Medication List: 1)  Vytorin 10-80 Mg Tabs (Ezetimibe-simvastatin) .Marland Kitchen.. 1 at bedtime daily by mouth 2)  Butalbital-asa-caffeine 50-325-40 Mg Caps (Butalbital-aspirin-caffeine) .Marland Kitchen.. 1 by mouth every 6 hours as needed headache 3)  Flomax 0.4 Mg Cp24 (Tamsulosin hcl) .Marland Kitchen.. 1 at bedtime by mouth once daily 4)  Proscar 5 Mg Tabs (Finasteride) .... One tab by mouth once daily 5)  Diclofenac Sodium 75 Mg Tbec (Diclofenac sodium) .Marland Kitchen.. 1 by mouth two times a day  Patient Instructions: 1)  f/u if no improvement in a month Prescriptions: DICLOFENAC SODIUM 75 MG TBEC (DICLOFENAC SODIUM) 1 by mouth two times a day  #60 x 1   Entered and Authorized by:   Hannah Beat MD   Signed by:   Hannah Beat MD on 06/09/2009   Method used:   [facility]        [facility] (retail)       [address]       [place], [place]  [address]       Ph: [phone] or [phone]       Fax: [phone]   RxID:   [id number]   Current Allergies (reviewed today): No known allergies

## 2010-04-05 NOTE — Progress Notes (Signed)
Summary: med was doubled last week-feels dizzy   Phone Note Call from Patient   Reason for Call: Talk to Nurse Summary of Call: pt's carvedilol was doubled and was very dizzy on sunday-can he go back to half the dosage? pls call [phone] Initial call taken by: Glynda Jaeger,  September 27, 2009 8:18 AM  Follow-up for Phone Call        PT C/O DIZZINESS ON SUN ONLY TOOK ONE COREG IN THE PM OF 3.125 MG  INSTRUCTED  TO CONT  WITH 3.125 MG two times a day AND TO KEEP B/P AND HR LOG AND TO CALL OFFICE  IN 7-10 DAYS WITH READINGS. ALSO NOTED PT IS ON VYTORIN 10/80 MG  SHOULD HE CONT? Follow-up by: Scherrie Bateman, LPN,  September 27, 2009 9:47 AM  Additional Follow-up for Phone Call Additional follow up Details #1::        Agree with plan to take 3.125 two times a day and record data. Keep on Vytorin. I would prefer not on Red Yeast [place] when on Vytorin.  Myrtis Ser  pt is aware, he states he has started up titrating his coreg and was doing ok he will cont to do that and monitor Meredith Staggers, RN  October 04, 2009 3:24 PM

## 2010-04-05 NOTE — Assessment & Plan Note (Signed)
Summary: Cardiology Nuclear Study  Nuclear Med Background Indications for Stress Test: Evaluation for Ischemia   History: Echo, Heart Catheterization, Myocardial Perfusion Study, MVP  History Comments: 11/01 MVR with anuloplasty ring; 10/04 MPS>?some inferior ischemia, EF=45%> cath:no significant CAD, EF=40-45%, Mild pulmonary hypertension; 10/10 Echo:EF=55%, mild LVH; h/o NSVT, rheumatic fever 10/10 Echo: EF=55%, mild LVH.  Symptoms: Chest Pain, Chest Pain with Exertion, Dizziness, DOE, Light-Headedness, Palpitations  Symptoms Comments: Last episode of CP:2 weeks ago.   Nuclear Pre-Procedure Cardiac Risk Factors: Family History - CAD, Lipids Caffeine/Decaff Intake: None NPO After: 6:00 PM Lungs: Clear IV 0.9% NS with Angio Cath: 22g     IV Site: (R) Hand IV Started by: Irean Hong RN Chest Size (in) 46     Height (in): 71 Weight (lb): 207 BMI: 28.97  Nuclear Med Study 1 or 2 day study:  1 day     Stress Test Type:  Stress Reading MD:  Arvilla Meres, MD     Referring MD:  Willa Rough, MD Resting Radionuclide:  Technetium 67m Tetrofosmin     Resting Radionuclide Dose:  11.0 mCi  Stress Radionuclide:  Technetium 79m Tetrofosmin     Stress Radionuclide Dose:  33.0 mCi   Stress Protocol Exercise Time (min):  8:01 min     Max HR:  146 bpm     Predicted Max HR:  161 bpm  Max Systolic BP: 164 mm Hg     Percent Max HR:  90.68 %     METS: 10.2 Rate Pressure Product:  [address]    Stress Test Technologist:  Rea [employer] CMA-N     Nuclear Technologist:  Domenic Polite CNMT  Rest Procedure  Myocardial perfusion imaging was performed at rest 45 minutes following the intravenous administration of Myoview Technetium 103m Tetrofosmin.  Stress Procedure  Baseline EKG shows a new RBBB (this was discussed with Dr. Myrtis Ser).  The patient exercised for 8:01.  The patient stopped due to fatigue and denied any chest pain.  There were no significant ST-T wave changes, only occasional PVC's.   Myoview was injected at peak exercise and myocardial perfusion imaging was performed after a brief delay.  QPS Raw Data Images:  Normal; no motion artifact; normal heart/lung ratio. Stress Images:  Moderately reduced uptake in the inferior wall Rest Images:  Mildly reduced uptake in the inferior wall Subtraction (SDS):  Previous small inferior infarct with mild to moderate peri-infarct ischemia Transient Ischemic Dilatation:  1.03  (Normal <1.22)  Lung/Heart Ratio:  .39  (Normal <0.45)  Quantitative Gated Spect Images QGS EDV:  142 ml QGS ESV:  91 ml QGS EF:  36 % QGS cine images:  LV is dilated with global hypokinesis  Findings Abnormal nuclear study Evidence for inferior ischemia   Evidence for LV Dysfunction LV Dysfunction    Overall Impression  Exercise Capacity: Fair exercise capacity. BP Response: Normal blood pressure response. Clinical Symptoms: There is dyspnea. ECG Impression: No significant ST segment change suggestive of ischemia. Overall Impression: Abnormal stress nuclear study. Overall Impression Comments: The LV is dilated with moderate global hypokinesis. Perfusion imaging is suggestive of a previous small inferior infarct with mild to moderate peri-infarct ischemia however this may be related to variable diaphragmatic attenuation.  Appended Document: Cardiology Nuclear Study I'll review with patient when I see him in the office.  Appended Document: Cardiology Nuclear Study pt aware at ov 5/9, sch for cath 5/12

## 2010-04-05 NOTE — Cardiovascular Report (Signed)
Summary: Pre Cath Orders   Pre Cath Orders   Imported By: Roderic Ovens 08/04/2009 11:16:10  _____________________________________________________________________  External Attachment:    Type:   Image     Comment:   External Document

## 2010-04-05 NOTE — Progress Notes (Signed)
Summary: Rx Diclofenac  Phone Note Refill Request Call back at [phone]- Message from:  [facility] on Jul 12, 2009 8:44 AM  Refills Requested: Medication #1:  DICLOFENAC SODIUM 75 MG TBEC 1 by mouth two times a day Received e-scribe refill request from pharmacy.   Method Requested: Electronic Initial call taken by: Sydell Axon LPN,  Jul 13, 2991 8:45 AM  Follow-up for Phone Call        Please use sparingly as it can bother your stomach to the point of ulcer, can make BP go up and/or can bother the kidneys. Follow-up by: Shaune Leeks MD,  Jul 12, 2009 1:34 PM  Additional Follow-up for Phone Call Additional follow up Details #1::        Patient notified as instructed by telephone. Additional Follow-up by: Sydell Axon LPN,  Jul 13, 7167 2:42 PM    Prescriptions: DICLOFENAC SODIUM 75 MG TBEC (DICLOFENAC SODIUM) 1 by mouth two times a day  #60 x 1   Entered and Authorized by:   Shaune Leeks MD   Signed by:   Shaune Leeks MD on 07/12/2009   Method used:   Electronically to        [facility] (retail)       [address]       [place], [place]  [address]       Ph: [phone] or [phone]       Fax: [phone]   RxID:   [id number]

## 2010-04-05 NOTE — Assessment & Plan Note (Signed)
Summary: HIP PROBLEM/DLO   Vital Signs:  Patient profile:   60 year old male Height:      71 inches Weight:      208.0 pounds BMI:     29.11 Temp:     97.5 degrees F oral Pulse rate:   76 / minute Pulse rhythm:   regular BP sitting:   110 / 70  (left arm) Cuff size:   regular  Vitals Entered By: Benny Lennert CMA Duncan Dull) (November 24, 2009 8:22 AM)  Contraindications/Deferment of Procedures/Staging:    Test/Procedure: FLU VAX    Reason for deferment: patient declined   History of Present Illness: 59 year old, f/u L hip:  Now able to walk without a liimp Does not have so much problems during the day  When getting up and walking around Sometimes a constant pain, sometimes a throbbing pain.  One time had some groin   L lateral and posterior lateral hip pain  No trauma or fall. NSAIDS made it better a little bit, but he had some return of symptoms and never has resolved.  Allergies (verified): No Known Drug Allergies  Past History:  Past medical, surgical, family and social histories (including risk factors) reviewed, and no changes noted (except as noted below).  Past Medical History: Reviewed history from 09/23/2009 and no changes required.  DYSLIPIDEMIA (ICD-272.4) * THYROID SURGERY * MITRAL VALVE REPAIR.  1999-07-28   ..good mitral valve function... echo.. October, 2010 EF 55%... echo... October, 2010  /  nuclear   April, 2011... EF 36%, ??? reliable ??? /  EF 45-50%... catheterization... rated 12, 07-27-09 Positional dizziness... mild... no orthostatic changes by blood pressure check Shortness of breath * ANEURYSMAL DILATATION PROXIMAL LAD  catheterization... 1999-07-28  /  repeat catheter 07/28/2002  no significant change aneurysmal dilatation of the LAD /  catheterization.. Jul 15 2009... aneurysmal proximal LAD with no change... normal right heart filling pressures and LVEDP.... ejection fraction 45-50% MITRAL REGURGITATION (ICD-396.3) BENIGN PROSTATIC HYPERTROPHY, HX OF  (ICD-V13.8) HYPERCHOLESTEROLEMIA (ICD-272.0) Chest discomfort   nuclear June 24, 2009.... suggestion prior small inferior MI with moderate peri-infarct ischemia versus variable diaphragmatic attenuation Palpitations    helped with carvedilol.... Jul 27, 2009  Past Surgical History: Reviewed history from 05/20/2008 and no changes required. L ELBOW ORIF 1965 APPENDECTOMY 1970 L THYROIDECTOMY DUE TO GOITER 1994 STRESS CARDIOLITE-- MILD INFEROLATERAL ISCHEMIA:(09/15/1999) ECHO-- MVP ; MILD AI :(09/1999) CATH. SEVERE MR O/W WITHIN NORMAL LIMITS:(09/1999) TEE MITRAL VALVUE ANGIOPLASTY:(01/22/2000) CATH . + CAD , MILD LV DYSFUN. -- MED. TREATMENT:(12/19/2002) STTRESS CARDIOLITE -- INF ISCHEMIA--EVALUATION:(12/12/2002) ECHO EF 55% :(12/11/2002) ECHO NO CHANGE EF NORMAL  ; TR; A.R.; T.R.:(11/30/2004) LEFT HERNIA REPAIR DR. MARTIN:(01/11/2005) COLONOSCOPY POLYPS--(07/02/2006) REPEAT IN 10 YEARS  Family History: Reviewed history from 05/20/2008 and no changes required. Father:ALIVE  87  LEAKING MITRAL VALVE  ARTHRITIS BPH Mother: DECEASED AT 42 YOA - ALZHEIMERS; CHF BROTHER  A 54 Heaqrt Murmur CV: +AUNT DECEASED FROM MI HBP: NEGATIVE DM: NEGATIVE GOUT/ARTHRITIS: + FATHER ARTHRITIS PROSTATE CANCER: POSITIVE GF AT 69 YOA BREAT/OVARIAN/UTERINE CANCER:  COLON CANCER NEGATIVE // UNCLE LUNG CANCER:- SMOKER DEPRESSION: NEGATIVE ETOH/DRUG ABUSE: NEGATIVE OTHER: NEGATIVE STROKE  Social History: Reviewed history from 05/20/2008 and no changes required. Marital Status: Married  WIDOWED WIFE DECEASED COLON CANCER:  1996-07-27//REMARRIED 28-Jul-1999 lives w/ remarried wife Children: 2 CHILDREN Occupation: Printmaker  Review of Systems       REVIEW OF SYSTEMS  GEN: No systemic complaints, no fevers, chills, sweats, or other acute illnesses MSK: Detailed in the  HPI GI: tolerating PO intake without difficulty Neuro: as above Otherwise the pertinent positives of the ROS are noted above.     Physical Exam  General:  GEN: Well-developed,well-nourished,in no acute distress; alert,appropriate and cooperative throughout examination HEENT: Normocephalic and atraumatic without obvious abnormalities. No apparent alopecia or balding. Ears, externally no deformities PULM: Breathing comfortably in no respiratory distress EXT: No clubbing, cyanosis, or edema PSYCH: Normally interactive. Cooperative during the interview. Pleasant. Friendly and conversant. Not anxious or depressed appearing. Normal, full affect.  Msk:  HIP EXAM: SIDE: Left ROM: Abduction, Flexion, Internal and External range of motion: WNL Pain with terminal IROM and EROM: minimal with terminal IROM GTB: NT Caudal to the GTB, near glute medius insertion, focal area of TTP SLR: NEG Knees: No effusion FABER: NT REVERSE FABER: NT, neg Piriformis: NT at direct palpation Str: flexion: 5/5 abduction: 5/5 adduction: 5/5 Strength testing non-tender and excellent   Impression & Recommendations:  Problem # 1:  HIP PAIN (ICD-719.45) Suspect primary L5 impingement ? given sacral tilt and mild anterolisthesis at L5-S1, leading to bursitis from one of the many hip bursas focally in this area. Both are problems in this case. Str excellent.  Lumbar series, 5 view indication: pain Findings: minimal evidence of DDD, mild anterolisthesis at L5-S1  Xray, hip series, L Indication: pain Findings: No occult fracture or dislocation. Mild osteoarthritic changes.   Hip Bursitis Injection Verbal consent obtained. Risks, benefits, and alternatives reviewed. L posterior hip at glute medius insertion sterilely prepped with Betadine. Ethyl Chloride used for anesthesia. 9 cc of Lidocaine injected with 1 cc of 40 mg Kenalog into bursa and tendon insertion at area of maximal tenderness. Bursa massaged. No bleeding and no complications. Decreased pain after injection. Needle: 22 gauge 1 1/2 in  The following medications were removed from  the medication list:    Diclofenac Sodium 75 Mg Tbec (Diclofenac sodium) .Marland Kitchen... 1 by mouth two times a day as needed His updated medication list for this problem includes:    Butalbital-asa-caffeine 50-325-40 Mg Caps (Butalbital-aspirin-caffeine) .Marland Kitchen... 1 by mouth every 6 hours as needed headache  Orders: T-Lumbar Spine Complete, 5 Views [phone]) T-Hip Comp Left Min 2-views (73510TC) Joint Aspirate / Injection, Large (20610) Kenalog 10mg  (4units) (J3301)  Problem # 2:  UNSPECIFIED NEURALGIA NEURITIS AND RADICULITIS (ICD-729.2) trial neurontin  Complete Medication List: 1)  Vytorin 10-80 Mg Tabs (Ezetimibe-simvastatin) .Marland Kitchen.. 1 at bedtime daily by mouth 2)  Butalbital-asa-caffeine 50-325-40 Mg Caps (Butalbital-aspirin-caffeine) .Marland Kitchen.. 1 by mouth every 6 hours as needed headache 3)  Flomax 0.4 Mg Cp24 (Tamsulosin hcl) .Marland Kitchen.. 1 at bedtime by mouth once daily 4)  Proscar 5 Mg Tabs (Finasteride) .... One tab by mouth once daily 5)  Fish Oil Oil (Fish oil) .... Daily  (1200mg ) 6)  Red Yeast Routon Powd (Red yeast Routon extract) .... Once daily 7)  Carvedilol 6.25 Mg Tabs (Carvedilol) .... Take one tablet by mouth twice a day 8)  Nevanac 0.1 % Susp (Nepafenac) .... Apply drops in eye 4 times daily 9)  Vigamox 0.5 % Soln (Moxifloxacin hcl) .... Apply drops  to eye 4 times daily 10)  Durezol 0.05 % Emul (Difluprednate) .... Apply drops to eye 4 times daily 11)  Gabapentin 300 Mg Caps (Gabapentin) .Marland Kitchen.. 1 by mouth three times a day  Patient Instructions: 1)  Gabapentin titration: 2)  1 tablet at night for 3 days, then 1 tablet twice a day for 3 days, then start 1 tablet three times a day  3)  f/u 6 weeks Prescriptions: GABAPENTIN 300 MG  CAPS (GABAPENTIN) 1 by mouth three times a day  #90 x 3   Entered and Authorized by:   Hannah Beat MD   Signed by:   Hannah Beat MD on 11/24/2009   Method used:   [facility]        [facility] (retail)       [address]        [place], [place]  [address]       Ph: [phone] or [phone]       Fax: [phone]   RxID:   [id number]   Current Allergies (reviewed today): No known allergies

## 2010-04-05 NOTE — Assessment & Plan Note (Signed)
Summary: LEFT HIP PAIN/CLE   Vital Signs:  Patient profile:   60 year old male Weight:      210 pounds Temp:     98.1 degrees F oral Pulse rate:   80 / minute Pulse rhythm:   irregular BP sitting:   110 / 74  (left arm) Cuff size:   regular  Vitals Entered By: Sydell Axon LPN (June 03, 2009 3:45 PM) CC: Left hip pain   History of Present Illness: Pt here for worsening of discomfort in the left lumbar/buttock area initially which has now settled into the more lateral area but still seems more muscular than joint. He is bothered most at night when in bed trying to get comfortable for sleep. He has been taking IBP 200mg x4 three times a day fairly routinely but it is now becoming ineffective. He has been using a topical rub that has helped at night as well but that is also losing it's effectiveness.  Problems Prior to Update: 1)  Palpitations  (ICD-785.1) 2)  Chest Pain  (ICD-786.50) 3)  Other Disorder of Coccyx/ Left Buttock  (ICD-724.79) 4)  Postural Lightheadedness  (ICD-780.4) 5)  Shortness of Breath  (ICD-786.05) 6)  Dyslipidemia  (ICD-272.4) 7)  Thyroid Surgery  () 8)  Mitral Valve Repair  () 9)  Aneurysmal Dilatation Proximal Lad  () 10)  Mitral Regurgitation  (ICD-396.3) 11)  Benign Prostatic Hypertrophy, Hx of  (ICD-V13.8) 12)  Hypercholesterolemia  (ICD-272.0)  Medications Prior to Update: 1)  Vytorin 10-80 Mg Tabs (Ezetimibe-Simvastatin) .Marland Kitchen.. 1 At Bedtime Daily By Mouth 2)  Butalbital-Asa-Caffeine 50-325-40 Mg  Caps (Butalbital-Aspirin-Caffeine) .Marland Kitchen.. 1 By Mouth Every 6 Hours As Needed Headache 3)  Flomax 0.4 Mg  Cp24 (Tamsulosin Hcl) .Marland Kitchen.. 1 At Bedtime By Mouth Once Daily 4)  Proscar 5 Mg  Tabs (Finasteride) .... One Tab By Mouth Once Daily  Allergies: No Known Drug Allergies  Physical Exam  General:  Well-developed,well-nourished,in no acute distress; alert,appropriate and cooperative throughout examination Head:  Normocephalic and atraumatic without obvious  abnormalities. No apparent alopecia or balding. Sinuses nontender. Eyes:  Conjunctiva clear bilaterally.  Ears:  External ear exam shows no significant lesions or deformities.  Otoscopic examination reveals clear canals, tympanic membranes are intact bilaterally without bulging, retraction, inflammation or discharge. Hearing is grossly normal bilaterally. Nose:  External nasal examination shows no deformity or inflammation. Nasal mucosa are pink and moist without lesions or exudates. Mouth:  Oral mucosa and oropharynx without lesions or exudates.  Teeth in good repair. Neck:  No deformities, masses, or tenderness noted. Lungs:  Normal respiratory effort, chest expands symmetrically. Lungs are clear to auscultation, no crackles or wheezes. Heart:  Normal rate and regular rhythm. S1 and S2 normal without gallop, murmur, click, rub or other extra sounds. Msk:  L hip/buttock area NT to deep palpation but can make it hurt worst by standing on left foot and mobilizing right leg. Movement of left hip does not produce pain. No swelling, redness or warmth is noted.   Impression & Recommendations:  Problem # 1:  OTHER DISORDER OF COCCYX/ LEFT BUTTOCK (ICD-724.79) Assessment Deteriorated Think this tobe pelvic stabilizer strain of the left buttock/hip area. Cont IBP and augment at night with Tyl. Use heat in AM, use ice at night altho can also then apply heat. May need work excuse for a while due to lots of walking on the job but will have pt see Dr Patsy Lager to insure not an intraarticular problem that needs to be addressed. He  does not need overt pain medication at this point.  Orders: Orthopedic Referral (Ortho)  Complete Medication List: 1)  Vytorin 10-80 Mg Tabs (Ezetimibe-simvastatin) .Marland Kitchen.. 1 at bedtime daily by mouth 2)  Butalbital-asa-caffeine 50-325-40 Mg Caps (Butalbital-aspirin-caffeine) .Marland Kitchen.. 1 by mouth every 6 hours as needed headache 3)  Flomax 0.4 Mg Cp24 (Tamsulosin hcl) .Marland Kitchen.. 1 at bedtime by  mouth once daily 4)  Proscar 5 Mg Tabs (Finasteride) .... One tab by mouth once daily  Patient Instructions: 1)  Refer to Dr Patsy Lager.  Current Allergies (reviewed today): No known allergies

## 2010-04-05 NOTE — Letter (Signed)
Summary: Appointment - Reminder 2  [facility], Main Office  [address]   [place], [place] [address]   Phone: [phone]  Fax: [phone]     May 20, 2009 MRN: [id number]   [facility] [address] [place], [place]  [address]   Dear Mr. WHITERS,  Our records indicate that it is time to schedule a follow-up appointment with Dr. Myrtis Ser. It is very important that we reach you to schedule this appointment. We look forward to participating in your health care needs. Please contact us at the number listed above at your earliest convenience to schedule your appointment.  If you are unable to make an appointment at this time, give [place] a call so we can update our records.     Sincerely,   [facility] Scheduling Team

## 2010-05-24 LAB — POCT I-STAT 3, VENOUS BLOOD GAS (G3P V)
Acid-base deficit: 1 mmol/L (ref 0.0–2.0)
Bicarbonate: 25.1 mEq/L — ABNORMAL HIGH (ref 20.0–24.0)
Bicarbonate: 25.3 mEq/L — ABNORMAL HIGH (ref 20.0–24.0)
O2 Saturation: 59 %
O2 Saturation: 63 %
TCO2: 26 mmol/L (ref 0–100)
pH, Ven: 7.361 — ABNORMAL HIGH (ref 7.250–7.300)
pO2, Ven: 34 mmHg (ref 30.0–45.0)

## 2010-05-24 LAB — POCT I-STAT 3, ART BLOOD GAS (G3+)
Bicarbonate: 25 mEq/L — ABNORMAL HIGH (ref 20.0–24.0)
O2 Saturation: 93 %
TCO2: 26 mmol/L (ref 0–100)
pO2, Arterial: 68 mmHg — ABNORMAL LOW (ref 80.0–100.0)

## 2010-06-25 ENCOUNTER — Other Ambulatory Visit: Payer: Self-pay | Admitting: Family Medicine

## 2010-06-28 NOTE — Telephone Encounter (Signed)
Alec Maldonado, is this your patient? I saw a couple of times for back problems in the last year, but I don't think I am managing his primary care issues.  (There is usually a phone note or "establish from Dr. Hetty Ely" note if change)

## 2010-06-29 NOTE — Telephone Encounter (Signed)
rx called to pharmacy 

## 2010-06-29 NOTE — Telephone Encounter (Signed)
Alec Maldonado, can you refill. As written in refill request.

## 2010-07-22 NOTE — Procedures (Signed)
. [facility]  Patient:    Alec Maldonado, Alec Maldonado                         MRN: [id number] Proc. Date: 01/16/00 Adm. Date:  [id number] Attending:  Waldo Laine                           Procedure Report  PROCEDURE:  Intraoperative transesophageal echocardiography.  ANESTHESIOLOGIST:  Guadalupe Maple, M.D.  INDICATION:  Alec Maldonado is a 60 year old white male with a history of mitral regurgitation, who was scheduled to undergo mitral valve repair or replacement.  Dr. Gwenith Daily. Gerhardt requested that an intraoperative transesophageal echocardiography be performed to evaluate the extent of mitral regurgitation and if possible, to determine its mechanism and to assist in judging the adequacy of the repair or replacement of the valve.  DESCRIPTION OF PROCEDURE:  The patient was brought to operating room at [facility].  General anesthesia was induced without difficulty.  The transesophageal echocardiography probe was inserted into the esophagus without difficulty.  IMPRESSION Pre-bypass findings: 1. Mitral regurgitation, 3+.  There appeared to be myxomatous disease    involving both the anterior and the posterior leaflets.  Both leaflets    appeared to be prolapsing; however, the posterior leaflet appeared more    severely involved, with override of the middle portion of the posterior    leaflet over the anterior leaflet.  This resulted in a jet of mitral    regurgitation which was directed anteromedially.  There was a proximal    iso-velocity surface area noted at the point of origination of the mitral    regurgitation jet.  Pulse Doppler interrogation of the right upper and left    upper pulmonary veins revealed a significant blunting of systolic flow but    forward systolic flow.  There was moderate calcification of the mitral    annulus, especially posteriorly.  There were no vegetations noted on the    valve. 2. The aortic valve was  structurally normal appearing.  It was trileaflet in    structure with normal opening and there was trace aortic insufficiency    noted. 3. Normal left ventricular function.  The left ventricle was not dilated.    There was good contractility in all segments interrogated. 4. Moderate left atrial enlargement with no evidence of thrombus in the left    atrium or left atrial appendage. 5. Intact intra-atrial septum. 6. Normal-size right ventricle. 7. Normal-appearing tricuspid valve with trace tricuspid insufficiency. 8. The proximal ascending aorta appeared free of significant atheromatous    disease.  Post-bypass findings: 1. Successful mitral valve repair.  There was an annuloplasty ring present and    evidence that the posterior leaflet of the mitral valve had been resected.    The anterior leaflet was freely mobile but was no longer prolapsing and    coapting well with the posterior leaflet, which was restricted in motion.    There was trace mitral insufficiency noted.  The annuloplasty ring appeared    well-seated with no perivalvular insufficiency noted. 2. The aortic valve was unchanged from the pre-bypass study and it appeared    normal in structure with trace aortic insufficiency. 3. The left ventricular function appeared slightly decreased from the    pre-bypass study.  There was some hypokinesis of the interventricular    septum, which improved following bypass. DD:  01/16/00 TD:  01/16/00 Job: [id number]

## 2010-07-22 NOTE — Cardiovascular Report (Signed)
[place]  Patient:    Alec Maldonado, Alec Maldonado                         MRN: [id number] Proc. Date: 09/23/99 Adm. Date:  [id number] Attending:  Ronaldo Miyamoto CC:         Arturo Morton. Riley Kill, M.D. [facility]             Luis Abed, M.D. [facility]             Robert ________, M.D.             Cardiovascular Laboratory                        Cardiac Catheterization  INDICATIONS:  The patient is a pleasant 60 year old white male who had had recurrent intermittent chest pain.  The pain has been not clearly associated with exertion.  The current study was done to assess coronary anatomy.  It is important to understand that he had a history of rheumatic fever, has known mitral regurgitation by examination, and the suggestion of aortic regurgitation by 2d echocardiography.  A Cardiolite suggests inferior ischemia.  He was subsequently referred for further evaluation.  PROCEDURES: 1. Left and right heart cardiac catheterization. 2. Selective coronary angiography. 3. Selective left ventriculography. 4. Thermodilution cardiac outputs.  DESCRIPTION OF PROCEDURE:  The procedure was performed from the right femoral artery using 6-French catheters.  Following coronary arteriography and left ventriculography, we elected to go ahead and do right heart catheterization because of more than expected mitral regurgitation.  Xylocaine 1% was used in the femoral area, and an 8-French sheath was placed in the right femoral vein. A Swan-Ganz thermodilution cardiac catheter was then passed up into the pulmonary capillary wedge position, with serial pressures measured through the right heart.  Saturations were obtained in the aorta and right heart and the pulmonary artery.  Simultaneous wedge LV pressures were obtained.  The pigtail catheter was subsequently removed.  Thermodilution cardiac outputs were performed.  The right heart catheter was then removed.  The patient was taken to  holding area in satisfactory clinical condition.  HEMODYNAMIC DATA:  1. Initial central aortic pressure:  95/68.  2. Left ventricular pressure:   106/21.  3. No gradient on pullback across the aortic valve.  4. Right atrial pressure:  9.  5. Right ventricular pressure:  38/11.  6. Pulmonary artery pressure:  35/17.  7. Pulmonary capillary wedge pressure:  17.  8. Simultaneous wedge and LV pressure:  Revealed a minimal mitral valve     gradient of 5 mmHg, within a predicted mitral valve area of 3.94 csq.  9. Cardiac Output/Index (thermodilution):  4.2 L/min // 2.24 L/min/msq. 10. Cardiac Output/Index (FICK):  4.0 L/min // 1.9 L/min/msq.  VENTRICULOGRAPHY:  Done in the RAO projection, reveals vigorous global systolic function; without segmental wall motion abnormality.  There was mitral annular calcification and at least moderate to moderately severe mitral regurgitation.  The aortic root does not reveal a dilated aortic root.  The aortic leaflets appear to be tri-leaflet, and with good excursion without calcification.  No significant  aortic regurgitation is noted.  ANGIOGRAPHIC RESULTS:  Under plain fluoroscopy there was evidence of extensive mitral annular calcification. 1. LEFT MAIN CORONARY ARTERY:  Free of critical disease. 2. LEFT ANTERIOR DESCENDING ARTERY:  The proximal LAD demonstrates evidence of    aneurysmal dilatation, just prior to the origin of  the diagonal.  There    does not appear to be a high-grade lesion.  The LAD and diagonal also    appear to be free of critical disease.  The portion of the mid LAD appears    to be intramyocardial, and there is mild systolic compression of this    vessel.  The distal vessel wraps the apex.  The diagonal branch is a    large-caliber vessel which is free of critical disease. 3. CIRCUMFLEX ARTERY:  A fairly large vessel that provides a proximal marginal    or ramus intermedius.  It then provides several smaller marginals, then    a  large posterolateral branch. 4. RIGHT CORONARY ARTERY:  Also demonstrates some ectasia in the proximal to    mid vessel.  However, there was no high-grade stenoses.  The distal vessel    consists predominately of the PDA, with a very small posterolateral    addition.  The PDA itself bifurcates proximally.  CONCLUSIONS: 1. Moderately severe mitral regurgitation. 2. No significant aortic regurgitation. 3. No critical coronary artery stenoses. 4. 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 5. Heavy mitral annular calcification.  DISPOSITION:  I will have the patient see Dr. Myrtis Ser back in follow-up.  He may need a transesophageal echocardiogram.  None of the findings today clearly explain the patients chest pain. DD:  09/23/99 TD:  09/23/99 Job: [id number]

## 2010-07-22 NOTE — Discharge Summary (Signed)
[place]. [facility]  Patient:    CHICO, CAWOOD                         MRN: [id number] Adm. Date:  [id number] Disc. Date: 01/22/00 Attending:  Waldo Laine Dictator:   Maxwell Marion, RNFA CC:         Caleen Essex. Peter Congo, M.D.  Luis Abed, M.D. [facility]   Discharge Summary  DATE OF BIRTH:  11-13-2050  ADMISSION DIAGNOSIS:  Mitral valve replacement (moderate to severe).  PAST MEDICAL HISTORY: 1. Rheumatic fever in 1961. 2. Mild pulmonary hypertension. 3. Hypercholesterolemia. 4. History of episodic bronchitis. 5. Remote history of goiter. 6. History of pneumonia in August of 2001.  ALLERGIES:  The patient has no known drug allergies.  DISCHARGE DIAGNOSES: 1. Severe mitral valve regurgitation, status post mitral valve repair. 2. Postoperative anemia, resolved.  HOSPITAL COURSE AND PROCEDURES:  Mr. Varady was electively admitted on January 16, 2000, to [place]. [facility] to [place] B. Tyrone Sage, M.D., for treatment of his mitral valve regurgitation.  On January 16, 2000, he underwent a mitral valve repair with quadrangle resection and repair of posterior leaflet and placement of a St. Jude angioplasty ring, size 32. Intraoperative TEE performed by Guadalupe Maple, M.D., revealed prolapsed anterior and posterior leaflet with severe mitral valve regurgitation.  At the conclusion of the procedure, he was transferred in stable condition to the SICU.  Mr. Trinka course has been uneventful.  He has made very good progress. It is anticipated that he can be discharged home tomorrow, January 22, 2000.  MEDICATIONS ON DISCHARGE: 1. Darvocet-N 100 one to two p.o. q.4-6h. p.r.n. 2. Lopressor 25 mg q.12h. 3. Folic acid 1 mg q.d. 4. Coumadin to take as directed by his INR.  FOLLOW-UP:  He will have his PT and INR drawn at the [facility] on Tuesday, January 24, 2000, and his dose will be regulated through this  clinic.  He has an appointment to see Luis Abed, M.D., on February 06, 2000, at 12 noon.  He will get a chest x-ray taken at that appointment.  The CVTS office will be in contact with an appointment to see Ramon Dredge B. Tyrone Sage, M.D., in three weeks. DD:  01/21/00 TD:  01/22/00 Job: [id number]

## 2010-07-22 NOTE — Cardiovascular Report (Signed)
NAME:  Alec Maldonado, Alec Maldonado                            ACCOUNT NO.:  [id number]   MEDICAL RECORD NO.:  [id number]                   PATIENT TYPE:  OIB   LOCATION:  2899                                 FACILITY:  [facility]   PHYSICIAN:  Veneda Melter, M.D.                   DATE OF BIRTH:  Jan 01, 1951   DATE OF PROCEDURE:  12/19/2002  DATE OF DISCHARGE:                              CARDIAC CATHETERIZATION   PROCEDURES PERFORMED:  1. Left heart catheterization.  2. Left ventriculogram.  3. Selective coronary angiography.  4. Right heart catheterization.   DIAGNOSES:  1. Mild coronary ____________.  2. Mild left ventricular systolic dysfunction.   CARDIOLOGIST:  Veneda Melter, M.D.   HISTORY:  Alec Maldonado is a 60 year old gentleman who underwent mitral valve  repair in 2001 and presented with dyspnea on exertion and substernal chest  discomfort.  Stress imaging study suggests inferior ischemia.  He was  referred for further assessment.   TECHNIQUE:  Informed consent was obtained.  The patient was brought to the  catheterization lab.  Six French sheath was placed in the right femoral  artery using the modified Seldinger technique.  Six [place] JL-4 and JR-4  catheters were then used to engage the left and right coronary arteries.  Selective angiography was performed in various projections using manual  injection of contrast.  At this point we elected to proceed with right heart  catheterization and an 8 French sheath was placed in the right femoral vein.  A 7.5 Jamaica PA catheter was advanced into the pulmonary artery and  appropriate right-sided hemodynamics were obtained.  A 6 French pigtail  catheter was then advanced via the right femoral artery into the left  ventricle and appropriate left-sided hemodynamics were obtained.  Left  ventriculogram was then performed using power injection of contrast.   At the termination of the case catheters and sheaths were removed.  Manual  pressure was  applied until adequate hemostasis was achieved.   The patient tolerated the procedure well and was transferred to the floor in  stable condition.   FINDINGS:  Findings are as follows:   RIGHT HEART CATHETERIZATION:  1. RA equals 9/8, mean equals 6.  2. RV equals 33/4.  3. PA equals 34/12.  4. Pulmonary capillary wedge pressure equals 15/14, mean equals 11.  5. Cardiac output is 5.0 liters per minute with a cardiac index of 2.4     liters/minute/squared meter by thermodilution.  Cardiac output and index     are 3.6 and 1.7 respectively by Fick.    LEFT HEART CATHETERIZATION:  1. Left Main Trunk:  Short, large caliber vessel with mild irregularities.   1. LAD:  The LAD is a large caliber vessel that provides a large first     diagonal branch in the proximal seg,ENT.  The LAD is aneurysmally dilated     in the proximal  segment at the takeoff of the first diagonal branch.     There is then luminal disease in the distal vessel.   1. Left Circumflex Artery:  The left circumflex artery is a large caliber     vessel that provides a large first marginal branch in its proximal     segment, several small marginal branches in the midsection and then a     large distal marginal branch.  The left circumflex system has luminal     irregularities.   1. Right Coronary Artery:  The right coronary artery is dominant.  It is a     medium caliber vessel that provides a small posterior descending artery     in its terminal segment.  The right coronary artery has mild aneurysmal     dilatation in the proximal segment near the takeoff of two RV marginal     branches.  The distal vessel has luminal irregularities.   1. LV:  Mildly dilated end-systolic and end-diastolic dimensions.  Overall     left ventricular function is mildly impaired with an ejection fraction of     approximately 40-45%.  There appears to be mild hypokinesis of the     anterior wall.  Trace mitral regurgitation is noted.  LV  pressure 101/0.     Aortic is 102/65.  LVEDP equals 8.   ASSESSMENT/PLAN:  Alec Maldonado is a 60 year old gentleman with noncritical  coronary artery disease and mild left ventricular dysfunction.  The patient  has very mild pulmonary hypertension.   At this point continued medical therapy will be recommended.                                                 Veneda Melter, M.D.    NG/MEDQ  D:  12/19/2002  T:  12/20/2002  Job:  [id number]   cc:   Alec Maldonado, M.D.  [address] [place]  [place] [address]  Fax: [phone]   Alec Maldonado, M.D.

## 2010-07-22 NOTE — Assessment & Plan Note (Signed)
 HEALTHCARE                            CARDIOLOGY OFFICE NOTE   NAME:Bodner, TYVON EGGENBERGER                         MRN:          [id number]  DATE:04/26/2006                            DOB:          1950/04/22    Mr. Inabinet is doing well.  He had a mitral valve repair in 2001, with a  St. Jude mitral annuloplasty ring.  He has done well.  He also has mild  aneurysmal dilatation of the proximal LAD.  He had a re-look study in  2004, and there was no significant occlusive disease.  He is doing well.  He is not having any chest pain or shortness of breath.   PAST MEDICAL HISTORY:   ALLERGIES:  NO KNOWN DRUG ALLERGIES.   MEDICATIONS:  1. Aspirin 81.  2. Vytorin 10/80.   OTHER MEDICAL PROBLEMS:  See the complete list below.   REVIEW OF SYSTEMS:  He feels great and his review of systems is  negative.   PHYSICAL EXAMINATION:  VITAL SIGNS:  Weight is 207 pounds, blood  pressure 122/82, with a pulse of 78.  NEUROLOGIC:  The patient is oriented to person, time, and place, and his  affect is normal.  HEENT:  There is no xanthelasma. He has normal extraocular motion.  NECK:  There are no carotid bruits.  There is no jugular venous  distention.  LUNGS:  Clear.  Respiratory effort is not labored.  CARDIAC:  Reveals an S1 with an S2.  There is n mitral regurgitation  heard.  ABDOMEN:  Soft.  He had no masses or bruits.  EXTREMITIES:  He has good distal pulses.   EKG reveals a sinus rhythm.  The patient had an echo in September 2006,  that showed his mitral valve repair was working quite well.  There is a  question of trivial mitral stenosis.  LV function was normal.   PROBLEM LIST:  1. History of severe mitral regurgitation with mitral valve repair and      a mitral annuloplasty ring in 2001.  Followup echocardiogram as      outlined in 2006.  2. 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  3.  History of thyroid surgery.  4. Elevated LDL being treated aggressively by Dr. Hetty Ely.  5. Normal ejection fraction.   The patient is stable.  I will see him back in 18 months.     Luis Abed, MD, [facility]  Electronically Signed    JDK/MedQ  DD: 04/26/2006  DT: 04/27/2006  Job #: [id number]   cc:   Arta Silence, MD

## 2010-07-22 NOTE — Op Note (Signed)
NAME:  Maldonado, Alec                  ACCOUNT NO.:  [id number]   MEDICAL RECORD NO.:  [id number]          PATIENT TYPE:  AMB   LOCATION:  DSC                          FACILITY:  [facility]   PHYSICIAN:  Thornton Park. Daphine Deutscher, MD  DATE OF BIRTH:  07/08/1950   DATE OF PROCEDURE:  01/11/2005  DATE OF DISCHARGE:                                 OPERATIVE REPORT   PREOPERATIVE DIAGNOSIS:  Left inguinal hernia.   POSTOP DIAGNOSIS:  Left direct inguinal hernia.   SURGEON:  Thornton Park. Daphine Deutscher, MD   ANESTHESIA:  General by LMA.   DESCRIPTION OF PROCEDURE:  Mr.  Cuny was taken to room 5 at [facility] day  surgery. First as per his instructions I examined his perianal region for  hemorrhoids. He has had some slight external hemorrhoids but nothing that I  felt like warranted surgical intervention at this time. The patient was then  placed inside the supine position and the inguinal region plus the scrotum  was prepped widely with Betadine and draped sterilely. Oblique incision was  carried down in the left inguinal region down to the external oblique. I  opened that down to the external ring. I found a very prominent direct  hernia.  First however, I went up and separated the ilioinguinal nerve  branch with the medial flap of external oblique.  I incised the cremasteric  proximally and dissected through some fat but did not see had an indirect  hernia. I then went down and opened the direct hernia and did a complete  dissection in the retroperitoneum behind the transversalis fascia. I used  the Prolene mesh system and inserted it without difficulty. It was anchored  to the pubis, inguinal ligament, to transversalis fascia medially and then  was cut taken around the cord and sutured to itself with another zero  Prolene. This was all tucked and fit nicely behind the external oblique. I  then closed the external oblique with running 2-0 Vicryl. 4-0 Vicryl was  used in the subcutaneous and then the skin was closed  with running  subcuticular 5-0 Vicryl with Benzoin and Steri-Strips. The patient seemed to  tolerate the procedure well and was taken to recovery room in satisfactory  condition.      Thornton Park Daphine Deutscher, MD  Electronically Signed     MBM/MEDQ  D:  01/11/2005  T:  01/11/2005  Job:  [id number]   cc:   Laurita Quint, M.D.  Fax: [phone]

## 2010-07-22 NOTE — Op Note (Signed)
[place]. [facility]  Patient:    Alec Maldonado, Alec Maldonado                         MRN: [id number] Proc. Date: 01/17/00 Adm. Date:  [id number] Attending:  Waldo Laine CC:         Luis Abed, M.D. [facility]   Operative Report  PREOPERATIVE DIAGNOSIS:  Severe mitral regurgitation.  POSTOPERATIVE DIAGNOSIS:  Severe mitral regurgitation.  OPERATION:  Mitral valve repair with quadrangular resection of the middle scallop of the posterior leaflet.  Placement of Gore-Tex corde to the anterior leaflet and ring annuloplasty with a St. Jude Sequin annuloplasty ring, #32.  SURGEON:  Gwenith Daily. Tyrone Sage, M.D.  FIRST ASSISTANT:  Loura Pardon, P.A.  INDICATIONS:  The patient is a 60 year old male who presented in August of 2001 with shortness of breath and chest discomfort.  He was seen and evaluated and was found to have a murmur of significant mitral regurgitation.  This was severe mitral regurgitation with mild pulmonary hypertension, was confirmed on both transesophageal echocardiogram and transthoracic echocardiogram.  The patient ultimately underwent cardiac catheterization by Dr. Bonnee Quin, which showed some slight aneurysmal dilatation of the proximal LAD but without any stenosis of his coronary arteries.  Ventricular function was overall preserved with severe mitral regurgitation evident.  The echocardiogram revealed some dilatation of the left ventricle with a end-diastolic dimension of 5.9 cm. Because of the patients severe mitral regurgitation, beginning of ventricular dilatation and symptoms of progressive shortness of breath primarily with exertion, mitral valve repair or replacement was recommended to the patient who agreed and signed informed consent.  DESCRIPTION OF PROCEDURE:  With Swan-Ganz and arterial liners in place, the patient underwent general endotracheal anesthesia without incident.  The skin on chest and legs were prepped with Betadine and  draped in the usual sterile manner.  A transesophageal echocardiogram probe was placed by Dr. Noreene Larsson.  This gave good visualization of mitral valve which showed prolapse of both the anterior and posterior leaflet but primarily prolapse of a portion of a middle scallop of the posterior leaflet.  Cardiac catheterization films had showed significant calcification in the mitral valve annulus.  Mitral regurgitation was severe with a anteriorly directed jet over the anterior leaflet.  Sternotomy was performed.  Pericardium was opened.  The patient did have evidence of biventricular enlargement.  He was systemically heparinized. Ascending aorta was cannulated.  Superior and inferior vena cava cannulae were placed.  A retrograde cardioplegia catheter was placed.  Aortic root antegrade cardioplegia needle was placed in the ascending aorta.  The patient was placed on cardiopulmonary bypass 2.4 L/min. per sq m.  Body temperature was cooled to 28 degrees.  Aortic cross-clamp was applied and 500 cc of cold antegrade cardioplegia was administered.  The atrium was opened along the intra-atrial groove and with mitral valve retractor in place good visualization of the valve was obtained on inspection.  There was evidence of prolapsing of the middle scallop of the posterior leaflet and without frank rupture of cords. There was also some prolapse of the middle scallop of the anterior leaflet with elongated corde, however.  The primary defect was the degree of prolapse of the posterior leaflet middle scallop.  It was decided to proceed with mitral valve repair.  A portion of the middle scallop of the posterior leaflet was excised.  A #2 Tycron pledget suture was placed at the base of the valve along the annulus.  The free portion of the leaflet then was reattached with 5-0 Ethibond sutures.  On passive testing of the valve following this the mitral regurgitation was improved.  Two 5-0 Gore-Tex sutures were then  placed through the papillary muscle and reapproximated the end and readjusted the free edge of the middle scallop of the anterior leaflet.  A Sequin semi-rigid complete annuloplasty ring was selected and adjusted and sized to a #32. Using 2-0 Tycron sutures the ring was secured in place.  On passive testing, with the ring in place, prolapse and mitral regurgitation was eliminated.  The heart was allowed to passively fill.  His left atriotomy was closed with horizontal mattress 3-0 Prolene.  Prior to complete closure, further heart de-airing maneuvers were performed.  Atriotomy closure was completed.  Aortic cross-clamp was removed with total cross-clamp time of 131 minutes.   During this time intermittent retrograde cardioplegia had been administered.  Aortic cross-clamp was removed.  A 16 gauge needle was introduced into the left ventricular apex to further de-air the heart.  The patient spontaneously converted first to a nodal rhythm and then to a sinus rhythm.  Body temperature was rewarmed to 37 degrees.  He was ventilated and weaned from cardiopulmonary bypass without difficulty.  He remained stable, cannulated in the usual fashion.  Protamine and sulfate was administered.  Prior to separation from bypass and with the heart full, again examination with the TE revealed no evidence of mitral regurgitation nor was there any evidence of SAM.  The patients total bypass time was 171 minutes.  With the operative field hemostatic, pericardium was reapproximated.  Two atrial ventricular pacing wires had been applied.  The sternum was closed with #6 stainless steel wire.  Two mediastinal tubes were left in place.  The fascia closed with interrupted 0 Vicryl, running 3-0 Vicryl in the subcutaneous tissue, 4-0 subcuticular stitch and skin edges.  Dry dressings were applied. Sponge and needle counts were reported as correct at the completion of procedure.  The patient tolerated the procedure  without obvious complication and was transferred to the surgical intensive care unit for further postoperative care. DD:  01/17/00 TD:  01/17/00  Job: [id number]

## 2010-09-04 ENCOUNTER — Other Ambulatory Visit: Payer: Self-pay | Admitting: Cardiology

## 2010-09-05 ENCOUNTER — Other Ambulatory Visit: Payer: Self-pay | Admitting: Family Medicine

## 2010-10-07 ENCOUNTER — Other Ambulatory Visit: Payer: Self-pay | Admitting: Family Medicine

## 2010-12-06 ENCOUNTER — Other Ambulatory Visit: Payer: Self-pay | Admitting: Family Medicine

## 2011-01-03 ENCOUNTER — Encounter: Payer: Self-pay | Admitting: Family Medicine

## 2011-01-04 ENCOUNTER — Encounter: Payer: Self-pay | Admitting: Family Medicine

## 2011-01-04 ENCOUNTER — Ambulatory Visit (INDEPENDENT_AMBULATORY_CARE_PROVIDER_SITE_OTHER): Payer: BC Managed Care – PPO | Admitting: Family Medicine

## 2011-01-04 DIAGNOSIS — D485 Neoplasm of uncertain behavior of skin: Secondary | ICD-10-CM | POA: Insufficient documentation

## 2011-01-04 NOTE — Patient Instructions (Signed)
RTC tomm for excision of left arm lesion. 30 min appt in the afternoon.

## 2011-01-04 NOTE — Progress Notes (Signed)
  Subjective:    Patient ID: Alec Maldonado, male    DOB: 1950-08-22, 60 y.o.   MRN: [id number]  HPI Pt of Dr Cyndie Chime, former pt of mine, here for itchy bump on his left arm. A portion of the lesion has been there a while but it has started to change and grow in the last few weeks. It has also become itchy recently. He denies trauma locally.    Review of Systems  Skin: Positive for color change. Negative for pallor, rash and wound.  .     Objective:   Physical Exam  Skin: Skin is warm and dry. No rash noted. No erythema. No pallor.       Assymetric 1cm lesion with 5mm benign mole encircled by irregular dimpled tan lesion that is maculopapular but easily delineated.          Assessment & Plan:

## 2011-01-04 NOTE — Assessment & Plan Note (Signed)
Will schedule for excision tommm afternoon to send to Pathology.

## 2011-01-05 ENCOUNTER — Encounter: Payer: Self-pay | Admitting: Family Medicine

## 2011-01-05 ENCOUNTER — Ambulatory Visit (INDEPENDENT_AMBULATORY_CARE_PROVIDER_SITE_OTHER): Payer: BC Managed Care – PPO | Admitting: Family Medicine

## 2011-01-05 VITALS — BP 118/78 | HR 88 | Temp 97.5°F | Wt 214.5 lb

## 2011-01-05 DIAGNOSIS — D485 Neoplasm of uncertain behavior of skin: Secondary | ICD-10-CM

## 2011-01-05 NOTE — Patient Instructions (Signed)
Keep area clean and dry for 24 hours. RTC one week for suture removal.

## 2011-01-05 NOTE — Assessment & Plan Note (Signed)
Excised and sent to Path.

## 2011-01-05 NOTE — Progress Notes (Signed)
  Subjective:    Patient ID: Alec Maldonado, male    DOB: April 30, 1950, 60 y.o.   MRN: [id number]  HPI Pt here for lesion excision from left forearm. Has mole that has been changing over the last few months.    Review of SystemsNoncontributory except as above.       Objective:   Physical Exam WDWNWM NAD. Left forearm prepped and draped in the usual sterile manner. 2% lido with epi used for local anesthesia. Elliptical incision made around the suspect lesion. Specimen sent to Pathology for eval. Borders looked free. 3 4-0 Ethilon simple interrupted sutures were placed. Minimal bleeding was incurred. Sterile dressing with Neosporin was placed. Pt tolerated procedure well. Will see back in one week.        Assessment & Plan:

## 2011-01-09 ENCOUNTER — Other Ambulatory Visit: Payer: Self-pay | Admitting: Cardiology

## 2011-01-12 ENCOUNTER — Ambulatory Visit (INDEPENDENT_AMBULATORY_CARE_PROVIDER_SITE_OTHER): Payer: BC Managed Care – PPO | Admitting: Family Medicine

## 2011-01-12 ENCOUNTER — Encounter: Payer: Self-pay | Admitting: Family Medicine

## 2011-01-12 VITALS — BP 120/84 | HR 88 | Temp 97.9°F | Wt 216.5 lb

## 2011-01-12 DIAGNOSIS — D485 Neoplasm of uncertain behavior of skin: Secondary | ICD-10-CM

## 2011-01-12 NOTE — Progress Notes (Signed)
  Subjective:    Patient ID: Alec Maldonado, male    DOB: 1950-03-16, 60 y.o.   MRN: [id number]  HPI Pt here for one week follow up and suture removal. He has done well and has no complaints today. His incision is healing well.    Review of SystemsNoncontributory except as above.       Objective:   Physical Exam WDWN WM NAD Incision on left forearm scabbed over with three sutures in place altho the incision looks to have pulled apart slightly. No erythema or swelling noted. No tenderness or discharge.        Assessment & Plan:  Excision of AK.    Discussed Path report, hypertrophic AK.  Sutures removed and steri strip placed.  Keep clean and dry for another 24 hrs.

## 2011-01-12 NOTE — Assessment & Plan Note (Signed)
AK removed. Discussed.

## 2011-01-14 ENCOUNTER — Other Ambulatory Visit: Payer: Self-pay | Admitting: Family Medicine

## 2011-02-22 ENCOUNTER — Other Ambulatory Visit: Payer: Self-pay | Admitting: Family Medicine

## 2011-02-22 NOTE — Telephone Encounter (Signed)
Received refill request electronically from pharmacy. Is it okay to refill medication? 

## 2011-05-09 ENCOUNTER — Other Ambulatory Visit: Payer: Self-pay | Admitting: Family Medicine

## 2011-05-10 NOTE — Telephone Encounter (Signed)
Ok #30, 2 refills

## 2011-05-10 NOTE — Telephone Encounter (Signed)
rx sent to pharmacy

## 2011-05-10 NOTE — Telephone Encounter (Signed)
Okay to refill? 

## 2011-05-11 ENCOUNTER — Other Ambulatory Visit: Payer: Self-pay | Admitting: Cardiology

## 2011-05-11 MED ORDER — CARVEDILOL 6.25 MG PO TABS: 6.2500 mg | ORAL_TABLET | Freq: Two times a day (BID) | ORAL | Status: DC

## 2011-05-18 ENCOUNTER — Other Ambulatory Visit: Payer: Self-pay | Admitting: Family Medicine

## 2011-06-09 ENCOUNTER — Other Ambulatory Visit: Payer: Self-pay | Admitting: Cardiology

## 2011-06-15 ENCOUNTER — Telehealth: Payer: Self-pay | Admitting: Family Medicine

## 2011-06-15 NOTE — Telephone Encounter (Signed)
Patient called earlier in the week asking to schedule an appointment with the doctor that he had been assigned to after Dr. Lorenza Chick retirement. He last saw Dr. Hetty Ely for a follow up on 01/12/2011 but has gotten is prescription refill through Dr. Patsy Lager since then. Was it ok to have schedule an appointment with you? Appointment will be to discuss prescription and meet you

## 2011-06-15 NOTE — Telephone Encounter (Signed)
Ok to do. Thanks.  

## 2011-06-16 ENCOUNTER — Encounter: Payer: Self-pay | Admitting: Family Medicine

## 2011-06-16 ENCOUNTER — Ambulatory Visit (INDEPENDENT_AMBULATORY_CARE_PROVIDER_SITE_OTHER): Payer: BC Managed Care – PPO | Admitting: Family Medicine

## 2011-06-16 VITALS — BP 120/72 | HR 95 | Temp 98.3°F | Ht 72.0 in | Wt 214.1 lb

## 2011-06-16 DIAGNOSIS — G43909 Migraine, unspecified, not intractable, without status migrainosus: Secondary | ICD-10-CM | POA: Insufficient documentation

## 2011-06-16 DIAGNOSIS — R002 Palpitations: Secondary | ICD-10-CM

## 2011-06-16 DIAGNOSIS — E785 Hyperlipidemia, unspecified: Secondary | ICD-10-CM

## 2011-06-16 DIAGNOSIS — R091 Pleurisy: Secondary | ICD-10-CM | POA: Insufficient documentation

## 2011-06-16 DIAGNOSIS — Z87898 Personal history of other specified conditions: Secondary | ICD-10-CM

## 2011-06-16 MED ORDER — FINASTERIDE 5 MG PO TABS: 5.0000 mg | ORAL_TABLET | Freq: Every day | ORAL | Status: DC

## 2011-06-16 NOTE — Assessment & Plan Note (Signed)
Stable on coreg - continue.

## 2011-06-16 NOTE — Patient Instructions (Signed)
Great to meet you today, call us with questions Return at your convenience fasting for blood work to check prostate and cholesterol levels.

## 2011-06-16 NOTE — Assessment & Plan Note (Signed)
sxs stable. Refilled proscar. DRE normal today. PSA to be drawn when returns fasting.

## 2011-06-16 NOTE — Assessment & Plan Note (Signed)
Last check great control.  Recheck when returns fasting. Alternates red yeast Needles with vytorin.

## 2011-06-16 NOTE — Assessment & Plan Note (Signed)
fioricet abortive

## 2011-06-16 NOTE — Progress Notes (Signed)
  Subjective:    Patient ID: Alec Maldonado, male    DOB: 02-16-1951, 61 y.o.   MRN: [id number]  HPI CC: transfer of care from dr. Hetty Ely  No h/o HTN.  H/o palpitations - controlled on coreg.    BPH - takes flomax and proscar.  Has been on these for last 2-3 years.  Good stream when taking medicines.   No dysuria or urgency.  nocturia x1.  No abd pain or back pain.  Dyslipidemia - no myalgias.  Alternates red yeast Sachdeva with vytorin.  Last check great control 2010.  Occasional intermittent pleurisy - improves with NSAIDs, but not currently bothering him.  H/o migraines, takes fioricet.  Last CPE - 1 1/2 yrs ago.  Declines CPE.  No questions or concerns today.  No smokers at home.   Caffeine - rarely.  Past Medical History  Diagnosis Date  . Dyslipidemia   . Mitral regurgitation   . History of BPH   . Pure hypercholesterolemia   . Chest discomfort     nuclear 06/24/09, suggestion prior small inferior MI with moderate peri-infarvt ischemia versus variable diaphragmatic attenuation  . Palpitations 07/2009    helped with carvedilol   . Postural dizziness     mild, no orthostatic changes by blood pressure check  . Shortness of breath   . Colon polyps     colonoscopy 07/02/06, repeat in 10 years   Review of Systems Per HPI    Objective:   Physical Exam  Nursing note and vitals reviewed. Constitutional: He appears well-developed and well-nourished. No distress.  HENT:  Head: Normocephalic and atraumatic.  Mouth/Throat: Oropharynx is clear and moist. No oropharyngeal exudate.  Eyes: Conjunctivae and EOM are normal. Pupils are equal, round, and reactive to light.  Neck: Normal range of motion. Neck supple.  Cardiovascular: Normal rate, regular rhythm, normal heart sounds and intact distal pulses.   No murmur heard. Pulmonary/Chest: Effort normal and breath sounds normal. No respiratory distress. He has no wheezes. He has no rales.  Abdominal: Soft. Bowel sounds are normal.  He exhibits no distension and no mass. There is no tenderness. There is no rebound and no guarding.  Genitourinary: Rectum normal. Rectal exam shows no external hemorrhoid, no internal hemorrhoid, no fissure, no mass, no tenderness and anal tone normal. Prostate is enlarged (~25gm). Prostate is not tender.  Lymphadenopathy:    He has no cervical adenopathy.  Skin: Skin is warm and dry. No rash noted.  Psychiatric: He has a normal mood and affect.       Assessment & Plan:

## 2011-07-11 ENCOUNTER — Other Ambulatory Visit: Payer: Self-pay | Admitting: Cardiology

## 2011-07-11 ENCOUNTER — Other Ambulatory Visit: Payer: Self-pay | Admitting: Family Medicine

## 2011-07-12 NOTE — Telephone Encounter (Signed)
plz double check with pt he's taking gabapentin as I received refill request but not on his list.

## 2011-07-13 NOTE — Telephone Encounter (Signed)
Spoke with patient. He confirmed that he did take med but only PRN. He said Dr. Patsy Lager had prescribed it.

## 2011-07-28 ENCOUNTER — Other Ambulatory Visit: Payer: Self-pay | Admitting: Family Medicine

## 2011-08-12 ENCOUNTER — Other Ambulatory Visit: Payer: Self-pay | Admitting: Cardiology

## 2011-08-16 ENCOUNTER — Other Ambulatory Visit: Payer: Self-pay | Admitting: Cardiology

## 2011-08-17 NOTE — Telephone Encounter (Signed)
New Problem:    Patient's wife called in needing a refill of her husband's carvedilol (COREG) 6.25 MG tablet.

## 2011-09-09 ENCOUNTER — Other Ambulatory Visit: Payer: Self-pay | Admitting: Family Medicine

## 2011-09-10 NOTE — Telephone Encounter (Signed)
Sent in.  plz remind pt to come in for labwork.  Order in chart.

## 2011-09-11 NOTE — Telephone Encounter (Signed)
Message left advising patient that Rx was refilled and to call the office to schedule fasting labs.

## 2011-09-28 ENCOUNTER — Other Ambulatory Visit (INDEPENDENT_AMBULATORY_CARE_PROVIDER_SITE_OTHER): Payer: BC Managed Care – PPO

## 2011-09-28 DIAGNOSIS — E785 Hyperlipidemia, unspecified: Secondary | ICD-10-CM

## 2011-09-28 DIAGNOSIS — Z87898 Personal history of other specified conditions: Secondary | ICD-10-CM

## 2011-09-28 LAB — BASIC METABOLIC PANEL
CO2: 26 mEq/L (ref 19–32)
Chloride: 106 mEq/L (ref 96–112)
Potassium: 4.4 mEq/L (ref 3.5–5.1)
Sodium: 139 mEq/L (ref 135–145)

## 2011-09-28 LAB — LIPID PANEL
LDL Cholesterol: 89 mg/dL (ref 0–99)
Total CHOL/HDL Ratio: 5

## 2011-09-28 LAB — PSA: PSA: 1.03 ng/mL (ref 0.10–4.00)

## 2012-03-18 ENCOUNTER — Other Ambulatory Visit: Payer: Self-pay | Admitting: *Deleted

## 2012-03-18 MED ORDER — CARVEDILOL 6.25 MG PO TABS: 6.2500 mg | ORAL_TABLET | Freq: Two times a day (BID) | ORAL | Status: DC

## 2012-03-21 ENCOUNTER — Telehealth: Payer: Self-pay | Admitting: Cardiology

## 2012-03-21 ENCOUNTER — Telehealth: Payer: Self-pay

## 2012-03-21 NOTE — Telephone Encounter (Signed)
New Problem:    Patient's wife called in needing a refill of his carvedilol (COREG) 6.25 MG tablet.  Patient was last seen in 09/2009.  Patient has an appointment on 03/28/12.

## 2012-03-21 NOTE — Telephone Encounter (Signed)
Called pt about refill of carvedilol (COREG) 6.25 MG sent to [facility] 03/18/12. Pt was not aware of refill. [facility] called [facility] and Rx is ready for pickup. Called and left message on pt machine that prescription is ready for pickup at [facility] and to keep follow up office visit.

## 2012-03-27 ENCOUNTER — Encounter: Payer: Self-pay | Admitting: Cardiology

## 2012-03-27 DIAGNOSIS — I340 Nonrheumatic mitral (valve) insufficiency: Secondary | ICD-10-CM | POA: Insufficient documentation

## 2012-03-27 DIAGNOSIS — R9430 Abnormal result of cardiovascular function study, unspecified: Secondary | ICD-10-CM | POA: Insufficient documentation

## 2012-03-27 DIAGNOSIS — R42 Dizziness and giddiness: Secondary | ICD-10-CM | POA: Insufficient documentation

## 2012-03-27 DIAGNOSIS — Z9889 Other specified postprocedural states: Secondary | ICD-10-CM | POA: Insufficient documentation

## 2012-03-27 DIAGNOSIS — R0789 Other chest pain: Secondary | ICD-10-CM | POA: Insufficient documentation

## 2012-03-27 DIAGNOSIS — E785 Hyperlipidemia, unspecified: Secondary | ICD-10-CM | POA: Insufficient documentation

## 2012-03-28 ENCOUNTER — Encounter: Payer: Self-pay | Admitting: Cardiology

## 2012-03-28 ENCOUNTER — Ambulatory Visit (INDEPENDENT_AMBULATORY_CARE_PROVIDER_SITE_OTHER): Payer: BC Managed Care – PPO | Admitting: Cardiology

## 2012-03-28 VITALS — BP 118/62 | HR 87 | Resp 12 | Ht 72.0 in | Wt 220.8 lb

## 2012-03-28 DIAGNOSIS — R002 Palpitations: Secondary | ICD-10-CM

## 2012-03-28 DIAGNOSIS — R55 Syncope and collapse: Secondary | ICD-10-CM

## 2012-03-28 DIAGNOSIS — Z9889 Other specified postprocedural states: Secondary | ICD-10-CM

## 2012-03-28 DIAGNOSIS — R0602 Shortness of breath: Secondary | ICD-10-CM

## 2012-03-28 DIAGNOSIS — E785 Hyperlipidemia, unspecified: Secondary | ICD-10-CM

## 2012-03-28 DIAGNOSIS — R0789 Other chest pain: Secondary | ICD-10-CM

## 2012-03-28 DIAGNOSIS — I4510 Unspecified right bundle-branch block: Secondary | ICD-10-CM

## 2012-03-28 NOTE — Assessment & Plan Note (Signed)
The patient has always chosen to use red yeast  Flax.

## 2012-03-28 NOTE — Progress Notes (Signed)
HPI  Patient is seen today to followup his mitral valve repair. In addition he has a new problem. He's having exertional shortness of breath. Also he had a presyncopal spell. He was working and was sitting in his truck doing some ketchup computer work. He began to feel as if he would pass out. He had some beads of sweat on his forhead. He did not have marked dizziness. There was no nausea or vomiting. There is no chest pain. He felt better and he has not had a return symptoms since then.  In the past he had done some walking for exercise. He gained some weight over the holidays and then as he began to walk he noticed shortness of breath at a much shorter distance.  I saw the patient last in the office July, 2011. As part of today's evaluation I have reviewed the old records and I have completely updated the new electronic medical record.  No Known Allergies  Current Outpatient Prescriptions  Medication Sig Dispense Refill  . aspirin 81 MG tablet Take 81 mg by mouth daily.        . butalbital-acetaminophen-caffeine (FIORICET, ESGIC) 50-325-40 MG per tablet TAKE 1 TABLET BY MOUTH EVERY 6 HOURS AS NEEDED FOR HEADACHE  30 tablet  2  . carvedilol (COREG) 6.25 MG tablet Take 1 tablet (6.25 mg total) by mouth 2 (two) times daily with a meal.  60 tablet  0  . ezetimibe-simvastatin (VYTORIN) 10-80 MG per tablet       . finasteride (PROSCAR) 5 MG tablet Take 1 tablet (5 mg total) by mouth daily.  90 tablet  3  . gabapentin (NEURONTIN) 300 MG capsule TAKE 1 CAPSULE BY MOUTH THREE TIMES A DAY  90 capsule  11  . Red Yeast Schriner POWD Alternates with vytorin      . Tamsulosin HCl (FLOMAX) 0.4 MG CAPS TAKE 1 CAPSULE BY MOUTH AT BEDTIME  90 capsule  3  . VYTORIN 10-80 MG per tablet TAKE 1 TABLET BY MOUTH AT BEDTIME (NEEDS LABS PER DR)  30 tablet  11    History   Social History  . Marital Status: Married    Spouse Name: N/A    Number of Children: 2  . Years of Education: N/A   Occupational History  .  repairs copiers    Social History Main Topics  . Smoking status: Never Smoker   . Smokeless tobacco: Never Used  . Alcohol Use: No  . Drug Use: No  . Sexually Active: Not on file   Other Topics Concern  . Not on file   Social History Narrative   Widowed, wife deceased colon cancer Jul 18, 1996, remarried 19-Jul-1999, lives with remarried wife    Family History  Problem Relation Age of Onset  . Alzheimer's disease Mother   . Heart disease Mother     CHF  . Arthritis Father   . Heart disease Father     leaking mitral valve  . Benign prostatic hyperplasia Father   . Heart disease Brother     murmur  . Hypertension Neg Hx   . Diabetes Neg Hx   . Depression Neg Hx   . Alcohol abuse Neg Hx   . Drug abuse Neg Hx   . Stroke Neg Hx     Past Medical History  Diagnosis Date  . Dyslipidemia   . Mitral regurgitation     Mitral valve repair 07-19-99  . BPH (benign prostatic hypertrophy)   . Dyslipidemia   .  Chest discomfort     nuclear 06/24/09, suggestion prior small inferior MI with moderate peri-infarvt ischemia versus variable diaphragmatic attenuation  . Palpitations 07/2009     helped with carvedilol   . Postural dizziness     mild, no orthostatic changes by blood pressure check  . Shortness of breath   . Colon polyps     colonoscopy 07/02/06, repeat in 10 years  . Migraines   . Pleurisy   . Status post mitral valve repair     2001,  //  echo, October, 2010, good function of the repaired mitral valve  . Ejection fraction     EF 55%, echo, October, 2010  //   EF 45-50%,  catheterization, 2011  . Pre-syncope     January, 2014    Past Surgical History  Procedure Date  . Thyroid surgery   . Mitral valve repair 01/22/00    good mitral valve function- echo 12/2008- EF 55% / nuclear 06/2009- EF 36%,? reliable? / EF 45-50%, cath rated 02/2010  . Aneurysmal dilatation     proximal LAD cath 2001, reoeat catheter 2004, no significant change  . Aneurysmal dilatiation 07/15/09    no change,  normal right heart filling pressures and LVEDP- EF 45-50%  . Appendectomy 1970  . Thyroidectomy     left, due to goiter 1994  . Orif elbow fracture     left  . Coronary angioplasty 09/1999    severe MR o/w normal limits  . Coronary angioplasty 12/19/02    pos. CAD, mild LV dysfunction- med treatment  . Hernia repair 01/11/05    left, Dr. Daphine Deutscher  . Cardiac catheterization 2010    mild global LV dysfunction EF 45-50%, no significant CAD obstruction, aneurysmal dilatation LAD    Patient Active Problem List  Diagnosis  . DYSLIPIDEMIA  . UNSPECIFIED NEURALGIA NEURITIS AND RADICULITIS  . POSTURAL LIGHTHEADEDNESS  . SHORTNESS OF BREATH  . BPH (benign prostatic hypertrophy)  . Migraines  . Pleurisy  . Status post mitral valve repair  . Chest discomfort  . Dyslipidemia  . Mitral regurgitation  . Palpitations  . Postural dizziness  . Ejection fraction  . Pre-syncope    ROS   Patient denies fever, chills, headache, sweats, rash, change in vision, change in hearing, chest pain, cough, nausea vomiting, urinary symptoms. All other systems are reviewed and are negative.  PHYSICAL EXAM  Patient is oriented to person time and place. Affect is normal. There is no jugulovenous distention. Lungs are clear. Respiratory effort is nonlabored. Cardiac exam reveals S1 and S2. There no clicks or significant murmurs. The abdomen is soft. There is no peripheral edema. There no musculoskeletal deformities. There are no skin rashes.  Filed Vitals:   03/28/12 1607  BP: 118/62  Pulse: 87  Resp: 12  Height: 6' (1.829 m)  Weight: 220 lb 12 oz (100.132 kg)   EKG is done today and reviewed by me. The patient has a right bundle branch block with sinus rhythm. This is a new finding. He had an incomplete right bundle at one time in the past. On his most recent EKG his QRS was normal.  ASSESSMENT & PLAN

## 2012-03-28 NOTE — Assessment & Plan Note (Signed)
The patient had mitral valve repair in 2001. There was good function of the valve on echo in 2010. He will be reassessed with his next echo.

## 2012-03-28 NOTE — Assessment & Plan Note (Signed)
The patient is not having any significant chest discomfort. We know from his cath in May, 2011 that he has aneurysmal dilatation of his proximal LAD.

## 2012-03-28 NOTE — Assessment & Plan Note (Signed)
Patient is now having some exertional shortness of breath that he was not having before. He is not having PND or orthopnea. I will have to see if he is having rhythm problems or whether he's had change in LV function or change in the function of his mitral valve repair. Two-dimensional echo will be done. In 2011 his ejection fraction was a little lower than it had been at 45-50%. Catheterization was done at that time. He had aneurysmal dilatation of the proximal LAD with no change. No further workup was done at that time. Echo will be done and I will reassess his status further.

## 2012-03-28 NOTE — Assessment & Plan Note (Signed)
The patient's right bundle-branch block is new. I am still not convinced that he is having any higher grade block causing symptoms. However it event recorder will be placed. This is a very reliable patient and we need to be quite careful and complete.

## 2012-03-28 NOTE — Assessment & Plan Note (Signed)
The patient had a presyncopal episode recently. He was sitting still looking at a computer in his car doing some work on the job. He's had some postural dizziness in the past. He does not describe any major positional change with this episode. We will be ruling out a tachycardia or bradycardia arrhythmia with an event recorder. In addition the echo will help [place] with more information.

## 2012-03-28 NOTE — Patient Instructions (Addendum)
Your physician has requested that you have an echocardiogram. Echocardiography is a painless test that uses sound waves to create images of your heart. It provides your doctor with information about the size and shape of your heart and how well your heart's chambers and valves are working. This procedure takes approximately one hour. There are no restrictions for this procedure.  Your physician has recommended that you wear an event monitor. Event monitors are medical devices that record the heart's electrical activity. Doctors most often [place] these monitors to diagnose arrhythmias. Arrhythmias are problems with the speed or rhythm of the heartbeat. The monitor is a small, portable device. You can wear one while you do your normal daily activities. This is usually used to diagnose what is causing palpitations/syncope (passing out).  Your physician recommends that you schedule a follow-up appointment in: on 04/03/12 if your echo has been done by then.   Otherwise, first available after the echo.

## 2012-03-28 NOTE — Assessment & Plan Note (Signed)
Historically he's had some palpitations but no marked arrhythmias in the past. Over time carvedilol has helped. He has not had any symptomatic palpitations recently.

## 2012-04-01 ENCOUNTER — Ambulatory Visit (HOSPITAL_COMMUNITY): Payer: BC Managed Care – PPO | Attending: Family Medicine | Admitting: Radiology

## 2012-04-01 DIAGNOSIS — R0989 Other specified symptoms and signs involving the circulatory and respiratory systems: Secondary | ICD-10-CM | POA: Insufficient documentation

## 2012-04-01 DIAGNOSIS — R42 Dizziness and giddiness: Secondary | ICD-10-CM | POA: Insufficient documentation

## 2012-04-01 DIAGNOSIS — R002 Palpitations: Secondary | ICD-10-CM | POA: Insufficient documentation

## 2012-04-01 DIAGNOSIS — E785 Hyperlipidemia, unspecified: Secondary | ICD-10-CM | POA: Insufficient documentation

## 2012-04-01 DIAGNOSIS — R55 Syncope and collapse: Secondary | ICD-10-CM | POA: Insufficient documentation

## 2012-04-01 DIAGNOSIS — I4510 Unspecified right bundle-branch block: Secondary | ICD-10-CM | POA: Insufficient documentation

## 2012-04-01 DIAGNOSIS — I359 Nonrheumatic aortic valve disorder, unspecified: Secondary | ICD-10-CM | POA: Insufficient documentation

## 2012-04-01 DIAGNOSIS — R0609 Other forms of dyspnea: Secondary | ICD-10-CM | POA: Insufficient documentation

## 2012-04-01 NOTE — Progress Notes (Signed)
Echocardiogram performed.  

## 2012-04-02 ENCOUNTER — Encounter: Payer: Self-pay | Admitting: Cardiology

## 2012-04-03 ENCOUNTER — Encounter: Payer: Self-pay | Admitting: Cardiology

## 2012-04-03 ENCOUNTER — Ambulatory Visit (INDEPENDENT_AMBULATORY_CARE_PROVIDER_SITE_OTHER): Payer: BC Managed Care – PPO | Admitting: Cardiology

## 2012-04-03 VITALS — BP 118/82 | HR 78 | Ht 72.0 in | Wt 220.0 lb

## 2012-04-03 DIAGNOSIS — Z9889 Other specified postprocedural states: Secondary | ICD-10-CM

## 2012-04-03 DIAGNOSIS — R0602 Shortness of breath: Secondary | ICD-10-CM

## 2012-04-03 DIAGNOSIS — R55 Syncope and collapse: Secondary | ICD-10-CM

## 2012-04-03 DIAGNOSIS — I351 Nonrheumatic aortic (valve) insufficiency: Secondary | ICD-10-CM | POA: Insufficient documentation

## 2012-04-03 DIAGNOSIS — I519 Heart disease, unspecified: Secondary | ICD-10-CM

## 2012-04-03 NOTE — Assessment & Plan Note (Signed)
He is not having any significant shortness of breath. No change in therapy. 

## 2012-04-03 NOTE — Assessment & Plan Note (Signed)
There is some right ventricular dysfunction. This may well be old. There is no documented pulmonary hypertension. This will be followed

## 2012-04-03 NOTE — Assessment & Plan Note (Signed)
He had one presyncopal episode. He is fine now. I've decided to cancel the event recorder. I'll see him back in 4 months for followup. He'll be in touch if he has any recurring symptoms.

## 2012-04-03 NOTE — Assessment & Plan Note (Signed)
His mitral valve repair is intact by the echo done earlier this month.

## 2012-04-03 NOTE — Patient Instructions (Addendum)
Your physician wants you to follow-up in: 4 months.   You will receive a reminder letter in the mail two months in advance. If you don't receive a letter, please call our office to schedule the follow-up appointment.  We have canceled the appt for your event monitor.

## 2012-04-03 NOTE — Progress Notes (Signed)
HPI  Patient is seen today to followup mitral valve disease and a presyncopal episode. This all the patient on March 28, 2012. He has history of a mitral valve repair in the past. He also described to me one episode that may of been a presyncopal spell. I arranged for a followup 2-D echo to be sure that his LV function was good and that his mitral valve repair was still intact. We have scheduled an event recorder for a later date. He returns today knees had no recurring symptoms. His echo shows very good LV function. His mitral valve repair is intact. There is no mitral stenosis and no mitral regurgitation. There is some dilatation of his right hard with some decreased function of the right heart. There was no significant tricuspid regurgitation. Therefore the right heart pressure could not be absolutely documented. This argues against pulmonary hypertension however. I also reviewed the echo from 08/02/2008. There was some right ventricular dilatation and dysfunction at that time. I suspect that this is an old finding and may go back to the earlier days when he had significant mitral regurgitation.  No Known Allergies  Current Outpatient Prescriptions  Medication Sig Dispense Refill  . aspirin 81 MG tablet Take 81 mg by mouth daily.        . butalbital-acetaminophen-caffeine (FIORICET, ESGIC) 50-325-40 MG per tablet TAKE 1 TABLET BY MOUTH EVERY 6 HOURS AS NEEDED FOR HEADACHE  30 tablet  2  . carvedilol (COREG) 6.25 MG tablet Take 1 tablet (6.25 mg total) by mouth 2 (two) times daily with a meal.  60 tablet  0  . finasteride (PROSCAR) 5 MG tablet Take 1 tablet (5 mg total) by mouth daily.  90 tablet  3  . gabapentin (NEURONTIN) 300 MG capsule TAKE 1 CAPSULE BY MOUTH THREE TIMES A DAY  90 capsule  11  . Red Yeast Hacking POWD Alternates with vytorin      . Tamsulosin HCl (FLOMAX) 0.4 MG CAPS TAKE 1 CAPSULE BY MOUTH AT BEDTIME  90 capsule  3  . VYTORIN 10-80 MG per tablet TAKE 1 TABLET BY MOUTH AT BEDTIME  (NEEDS LABS PER DR)  30 tablet  11    History   Social History  . Marital Status: Married    Spouse Name: N/A    Number of Children: 2  . Years of Education: N/A   Occupational History  . repairs copiers    Social History Main Topics  . Smoking status: Never Smoker   . Smokeless tobacco: Never Used  . Alcohol Use: No  . Drug Use: No  . Sexually Active: Not on file   Other Topics Concern  . Not on file   Social History Narrative   Widowed, wife deceased colon cancer Aug 02, 1996, remarried 08/03/99, lives with remarried wife    Family History  Problem Relation Age of Onset  . Alzheimer's disease Mother   . Heart disease Mother     CHF  . Arthritis Father   . Heart disease Father     leaking mitral valve  . Benign prostatic hyperplasia Father   . Heart disease Brother     murmur  . Hypertension Neg Hx   . Diabetes Neg Hx   . Depression Neg Hx   . Alcohol abuse Neg Hx   . Drug abuse Neg Hx   . Stroke Neg Hx     Past Medical History  Diagnosis Date  . Dyslipidemia   . Mitral regurgitation  Mitral valve repair 2001  . BPH (benign prostatic hypertrophy)   . Dyslipidemia   . Chest discomfort     nuclear 06/24/09, suggestion prior small inferior MI with moderate peri-infarvt ischemia versus variable diaphragmatic attenuation  . Palpitations 07/2009     helped with carvedilol   . Postural dizziness     mild, no orthostatic changes by blood pressure check  . Shortness of breath   . Colon polyps     colonoscopy 07/02/06, repeat in 10 years  . Migraines   . Pleurisy   . Status post mitral valve repair     2001,  //  echo, October, 2010, good function of the repaired mitral valve  . Ejection fraction     EF 55%, echo, October, 2010  //   EF 45-50%,  catheterization, 2011  . Pre-syncope     January, 2014  . RBBB     First seen January, 2014  . Aortic insufficiency     Mild, echo, January, 2014  . Right ventricular dysfunction     Mild/moderate right ventricular  dysfunction by echo, January, 2014, no tricuspid regurgitation, therefore right heart pressure could not be estimated.    Past Surgical History  Procedure Date  . Thyroid surgery   . Mitral valve repair 01/22/00    good mitral valve function- echo 12/2008- EF 55% / nuclear 06/2009- EF 36%,? reliable? / EF 45-50%, cath rated 02/2010  . Aneurysmal dilatation     proximal LAD cath 2001, reoeat catheter 2004, no significant change  . Aneurysmal dilatiation 07/15/09    no change, normal right heart filling pressures and LVEDP- EF 45-50%  . Appendectomy 1970  . Thyroidectomy     left, due to goiter 1994  . Orif elbow fracture     left  . Coronary angioplasty 09/1999    severe MR o/w normal limits  . Coronary angioplasty 12/19/02    pos. CAD, mild LV dysfunction- med treatment  . Hernia repair 01/11/05    left, Dr. Daphine Deutscher  . Cardiac catheterization 2010    mild global LV dysfunction EF 45-50%, no significant CAD obstruction, aneurysmal dilatation LAD    Patient Active Problem List  Diagnosis  . DYSLIPIDEMIA  . UNSPECIFIED NEURALGIA NEURITIS AND RADICULITIS  . POSTURAL LIGHTHEADEDNESS  . SHORTNESS OF BREATH  . BPH (benign prostatic hypertrophy)  . Migraines  . Pleurisy  . Status post mitral valve repair  . Chest discomfort  . Dyslipidemia  . Mitral regurgitation  . Palpitations  . Postural dizziness  . Ejection fraction  . Pre-syncope  . RBBB  . Aortic insufficiency  . Right ventricular dysfunction    ROS   Patient denies fever, chills, headache, sweats, rash, change in vision, change in hearing, chest pain, cough, nausea vomiting, urinary symptoms. All other systems are reviewed and are negative.  PHYSICAL EXAM  Patient stable today. There is no jugulovenous distention. Lungs are clear. Respiratory effort is nonlabored. Cardiac exam reveals S1 and S2. There are no clicks or significant murmurs. Abdomen is soft. There is no peripheral edema.  Filed Vitals:   04/03/12 1513    BP: 118/82  Pulse: 78  Height: 6' (1.829 m)  Weight: 220 lb (99.791 kg)  SpO2: 95%     ASSESSMENT & PLAN

## 2012-04-15 ENCOUNTER — Other Ambulatory Visit: Payer: Self-pay | Admitting: Family Medicine

## 2012-04-23 ENCOUNTER — Other Ambulatory Visit: Payer: Self-pay

## 2012-04-23 MED ORDER — CARVEDILOL 6.25 MG PO TABS: 6.2500 mg | ORAL_TABLET | Freq: Two times a day (BID) | ORAL | Status: DC

## 2012-07-15 ENCOUNTER — Other Ambulatory Visit: Payer: Self-pay | Admitting: Family Medicine

## 2012-08-05 ENCOUNTER — Ambulatory Visit: Payer: BC Managed Care – PPO | Admitting: Cardiology

## 2012-08-15 ENCOUNTER — Other Ambulatory Visit: Payer: Self-pay | Admitting: Family Medicine

## 2012-08-20 ENCOUNTER — Encounter: Payer: Self-pay | Admitting: Family Medicine

## 2012-08-20 ENCOUNTER — Ambulatory Visit (INDEPENDENT_AMBULATORY_CARE_PROVIDER_SITE_OTHER): Payer: BC Managed Care – PPO | Admitting: Family Medicine

## 2012-08-20 VITALS — BP 116/74 | HR 88 | Temp 98.2°F | Wt 219.5 lb

## 2012-08-20 DIAGNOSIS — D229 Melanocytic nevi, unspecified: Secondary | ICD-10-CM

## 2012-08-20 DIAGNOSIS — E785 Hyperlipidemia, unspecified: Secondary | ICD-10-CM

## 2012-08-20 DIAGNOSIS — D235 Other benign neoplasm of skin of trunk: Secondary | ICD-10-CM

## 2012-08-20 DIAGNOSIS — N400 Enlarged prostate without lower urinary tract symptoms: Secondary | ICD-10-CM

## 2012-08-20 DIAGNOSIS — D239 Other benign neoplasm of skin, unspecified: Secondary | ICD-10-CM

## 2012-08-20 HISTORY — DX: Other benign neoplasm of skin of trunk: D23.5

## 2012-08-20 MED ORDER — CARVEDILOL 6.25 MG PO TABS: 6.2500 mg | ORAL_TABLET | Freq: Two times a day (BID) | ORAL | Status: DC

## 2012-08-20 MED ORDER — TAMSULOSIN HCL 0.4 MG PO CAPS: ORAL_CAPSULE | ORAL | Status: DC

## 2012-08-20 MED ORDER — FINASTERIDE 5 MG PO TABS: ORAL_TABLET | ORAL | Status: DC

## 2012-08-20 NOTE — Assessment & Plan Note (Signed)
Will return on Friday for excisional biopsy.

## 2012-08-20 NOTE — Assessment & Plan Note (Signed)
Alternates RYR with vytorin.  Tolerating meds well.

## 2012-08-20 NOTE — Progress Notes (Signed)
Subjective:    Patient ID: Alec Maldonado, male    DOB: 1950-08-14, 62 y.o.   MRN: [id number]  HPI CC: med refill  BPH - nocturia x1.  Good stream. No changes. No dysuria, or hematuria.  HLD - denies myalgias.  No joint pains.  Spots on skin - has had several removed years ago (cut off).  noticed one on left forearm 2 months ago.  One on back noted as well.  Some itchy.  No bleeding.  UTD colonoscopy 2008 - states told good for 10 yrs  Past Medical History  Diagnosis Date  . Dyslipidemia   . Mitral regurgitation     Mitral valve repair 2001  . BPH (benign prostatic hypertrophy)   . Dyslipidemia   . Chest discomfort     nuclear 06/24/09, suggestion prior small inferior MI with moderate peri-infarvt ischemia versus variable diaphragmatic attenuation  . Palpitations 07/2009     helped with carvedilol   . Postural dizziness     mild, no orthostatic changes by blood pressure check  . Shortness of breath   . Colon polyps     colonoscopy 07/02/06, repeat in 10 years  . Migraines   . Pleurisy   . Status post mitral valve repair     2001,  //  echo, October, 2010, good function of the repaired mitral valve  . Ejection fraction     EF 55%, echo, October, 2010  //   EF 45-50%,  catheterization, 2011  . Pre-syncope     January, 2014  . RBBB     First seen January, 2014  . Aortic insufficiency     Mild, echo, January, 2014  . Right ventricular dysfunction     Mild/moderate right ventricular dysfunction by echo, January, 2014, no tricuspid regurgitation, therefore right heart pressure could not be estimated.    Past Surgical History  Procedure Laterality Date  . Thyroid surgery    . Mitral valve repair  01/22/00    good mitral valve function- echo 12/2008- EF 55% / nuclear 06/2009- EF 36%,? reliable? / EF 45-50%, cath rated 02/2010  . Aneurysmal dilatation      proximal LAD cath 2001, reoeat catheter 2004, no significant change  . Aneurysmal dilatiation  07/15/09    no change, normal  right heart filling pressures and LVEDP- EF 45-50%  . Appendectomy  1970  . Thyroidectomy      left, due to goiter 1994  . Orif elbow fracture      left  . Coronary angioplasty  09/1999    severe MR o/w normal limits  . Coronary angioplasty  12/19/02    pos. CAD, mild LV dysfunction- med treatment  . Hernia repair  01/11/05    left, Dr. Daphine Deutscher  . Cardiac catheterization  2010    mild global LV dysfunction EF 45-50%, no significant CAD obstruction, aneurysmal dilatation LAD    Review of Systems Per HPI    Objective:   Physical Exam  Nursing note and vitals reviewed. Constitutional: He appears well-developed and well-nourished. No distress.  HENT:  Mouth/Throat: Oropharynx is clear and moist. No oropharyngeal exudate.  Eyes: Conjunctivae and EOM are normal. Pupils are equal, round, and reactive to light.  Neck: Normal range of motion. Neck supple.  Cardiovascular: Normal rate, regular rhythm, normal heart sounds and intact distal pulses.   No murmur heard. Pulmonary/Chest: Effort normal and breath sounds normal. No respiratory distress. He has no wheezes. He has no rales.  Genitourinary: Rectum normal and  prostate normal. Rectal exam shows no external hemorrhoid, no internal hemorrhoid, no fissure, no mass, no tenderness and anal tone normal. Prostate is not enlarged (20gm) and not tender.  Musculoskeletal: He exhibits no edema.  Skin: Skin is warm and dry. No rash noted.  Irregular mole on left lower back SK on left arm as well as smaller AK       Assessment & Plan:

## 2012-08-20 NOTE — Patient Instructions (Addendum)
Return Friday afternoon for excision biopsy at 3:30pm of irregular mole on left lower back. Meds refilled today. Return after July 25th for blood work.

## 2012-08-20 NOTE — Assessment & Plan Note (Addendum)
meds refilled today.  DRE stable.  Check PSA next month when due.

## 2012-08-23 ENCOUNTER — Ambulatory Visit (INDEPENDENT_AMBULATORY_CARE_PROVIDER_SITE_OTHER): Payer: BC Managed Care – PPO | Admitting: Family Medicine

## 2012-08-23 VITALS — BP 126/84 | HR 80 | Temp 98.1°F | Wt 217.8 lb

## 2012-08-23 DIAGNOSIS — D239 Other benign neoplasm of skin, unspecified: Secondary | ICD-10-CM

## 2012-08-23 DIAGNOSIS — D229 Melanocytic nevi, unspecified: Secondary | ICD-10-CM

## 2012-08-23 MED ORDER — EZETIMIBE-SIMVASTATIN 10-80 MG PO TABS: 1.0000 | ORAL_TABLET | Freq: Every day | ORAL | Status: DC

## 2012-08-23 MED ORDER — HYDROCODONE-ACETAMINOPHEN 5-325 MG PO TABS: 1.0000 | ORAL_TABLET | Freq: Four times a day (QID) | ORAL | Status: DC | PRN

## 2012-08-23 NOTE — Patient Instructions (Signed)
Keep area clean and dry.  May shower but no soaking. Return in 7 days for suture removal, sooner if spreading redness or draining pus or other concerns. Good to see you today, call us with questions.

## 2012-08-23 NOTE — Progress Notes (Signed)
  Subjective:    Patient ID: Alec Maldonado, male    DOB: 14-Oct-1950, 62 y.o.   MRN: [id number]  HPI CC: excision procedure  See prior note for details. Pt presents today for excision of abnormal mole on left lower back.   Review of Systems Per HPI    Objective:   Physical Exam L lower back with irregular pigmented nevus measuring 9mm in length and 3mm in width - with margins, length of lesion 1.2cm     Assessment & Plan:

## 2012-08-24 NOTE — Assessment & Plan Note (Signed)
IC obtained and in chart.  Area delinated in diamond shape with 3mm margin along skin tension lines.  Area prepped and draped in sterile fashion with alcohol and betadine.  Using lidocaine with epi, anesthesia achieved.  Using 11 scalpel, lesion excised completely.  Hemostasis achieved with silver nitrate.  Skin approximated with 3 simple interupted sutures.  Area cleaned and dressed.  RTC in 7-10 days for suture removal.  Lesion sent to pathology.

## 2012-08-29 ENCOUNTER — Other Ambulatory Visit: Payer: Self-pay | Admitting: Family Medicine

## 2012-08-29 NOTE — Telephone Encounter (Signed)
Ok, #30, 0 ref 

## 2012-08-29 NOTE — Telephone Encounter (Signed)
rx sent to pharmacy

## 2012-09-02 ENCOUNTER — Encounter: Payer: Self-pay | Admitting: *Deleted

## 2012-09-03 ENCOUNTER — Ambulatory Visit (INDEPENDENT_AMBULATORY_CARE_PROVIDER_SITE_OTHER): Payer: BC Managed Care – PPO | Admitting: Family Medicine

## 2012-09-03 ENCOUNTER — Encounter: Payer: Self-pay | Admitting: Family Medicine

## 2012-09-03 VITALS — BP 112/78 | HR 68 | Temp 97.5°F | Wt 218.2 lb

## 2012-09-03 DIAGNOSIS — D235 Other benign neoplasm of skin of trunk: Secondary | ICD-10-CM

## 2012-09-03 MED ORDER — CEPHALEXIN 500 MG PO CAPS: 500.0000 mg | ORAL_CAPSULE | Freq: Three times a day (TID) | ORAL | Status: DC

## 2012-09-03 NOTE — Patient Instructions (Addendum)
Good to see you today. Call us with questions. Take antibiotic by mouth for the next week Return in 7-10 days for recheck of wound. Dress daily with antibiotic ointment. May bathe in ocean but don't soak in water. Keep area as clean and dry as possible.

## 2012-09-03 NOTE — Assessment & Plan Note (Signed)
Unfortunately skin edges did not approximate - will allow to heal by second intention. Mild erythema around skin edges - treat with course of oral abx. rtc 7-10 days for recheck. Wound care discussed.

## 2012-09-03 NOTE — Progress Notes (Signed)
CC: suture removal.  See prior note for details. Last week excision performed on pigmented lesion on left lower back. Denies erythema, drainage, or pain.  No fevers.  Reviewed path report with patient: junctional and lentiginous dysplastic nevus with moderate atypia.  Margins free.  PE: NAD, WDWN CM Area of biopsy - stitches removed.  Area not approximated, remains open.  Granulation tissue present as well.

## 2012-09-13 ENCOUNTER — Ambulatory Visit (INDEPENDENT_AMBULATORY_CARE_PROVIDER_SITE_OTHER): Payer: BC Managed Care – PPO | Admitting: Family Medicine

## 2012-09-13 ENCOUNTER — Encounter: Payer: Self-pay | Admitting: Family Medicine

## 2012-09-13 VITALS — BP 108/70 | HR 61 | Temp 97.5°F | Wt 217.5 lb

## 2012-09-13 DIAGNOSIS — Z5189 Encounter for other specified aftercare: Secondary | ICD-10-CM

## 2012-09-13 DIAGNOSIS — T8149XA Infection following a procedure, other surgical site, initial encounter: Secondary | ICD-10-CM | POA: Insufficient documentation

## 2012-09-13 NOTE — Assessment & Plan Note (Signed)
Tolerated antibiotic - finished. Wound now healing well - scab present.  Healing by second intention Discussed continued wound care.  rec use vaseline ointment on wound.

## 2012-09-13 NOTE — Progress Notes (Signed)
  Subjective:    Patient ID: Alec Maldonado, male    DOB: 01-Aug-1950, 62 y.o.   MRN: [id number]  HPI CC: recheck wound  Thinks healing well. No surrounding redness.  Some itching.  Review of Systems Per HPI    Objective:   Physical Exam Site of wound now closing in - scab present.  No surrounding erythema.    Assessment & Plan:

## 2012-09-24 ENCOUNTER — Telehealth: Payer: Self-pay

## 2012-09-24 DIAGNOSIS — Z125 Encounter for screening for malignant neoplasm of prostate: Secondary | ICD-10-CM

## 2012-09-24 DIAGNOSIS — E785 Hyperlipidemia, unspecified: Secondary | ICD-10-CM

## 2012-09-24 NOTE — Telephone Encounter (Signed)
Pt said had been about one year since lab testing done and wants to schedule lab test Dr Reece Agar thinks pt needs. Pt request cb to schedule lab appt.

## 2012-09-25 NOTE — Telephone Encounter (Signed)
plz schedule lab visit.  I recommend coming in for physical afterwards.

## 2012-09-26 NOTE — Telephone Encounter (Signed)
Lab appt scheduled. Patient said you just did a physical(?)

## 2012-09-26 NOTE — Telephone Encounter (Signed)
That's fine. He can just do blood work doesn't need to come in for physical. We did a med refill visit 08/2012.

## 2012-09-30 ENCOUNTER — Encounter: Payer: Self-pay | Admitting: *Deleted

## 2012-10-01 ENCOUNTER — Other Ambulatory Visit: Payer: BC Managed Care – PPO

## 2012-10-02 ENCOUNTER — Other Ambulatory Visit (INDEPENDENT_AMBULATORY_CARE_PROVIDER_SITE_OTHER): Payer: BC Managed Care – PPO

## 2012-10-02 DIAGNOSIS — E785 Hyperlipidemia, unspecified: Secondary | ICD-10-CM

## 2012-10-02 DIAGNOSIS — Z125 Encounter for screening for malignant neoplasm of prostate: Secondary | ICD-10-CM

## 2012-10-02 LAB — LIPID PANEL: Cholesterol: 162 mg/dL (ref 0–200)

## 2012-10-02 LAB — COMPREHENSIVE METABOLIC PANEL
ALT: 23 U/L (ref 0–53)
AST: 21 U/L (ref 0–37)
Albumin: 4.1 g/dL (ref 3.5–5.2)
Calcium: 9.6 mg/dL (ref 8.4–10.5)
Chloride: 106 mEq/L (ref 96–112)
Potassium: 4.5 mEq/L (ref 3.5–5.1)
Total Protein: 6.8 g/dL (ref 6.0–8.3)

## 2012-10-06 NOTE — Telephone Encounter (Signed)
This phone note was never closed.  plz notify of lab results and notify he doesn't need to come in for physical as he had med refill visit 08/2012.

## 2012-10-07 ENCOUNTER — Encounter: Payer: Self-pay | Admitting: *Deleted

## 2012-10-07 ENCOUNTER — Other Ambulatory Visit: Payer: Self-pay | Admitting: Family Medicine

## 2012-10-07 NOTE — Telephone Encounter (Signed)
Will notify patient of results. Documentation on lab results.

## 2013-01-12 ENCOUNTER — Other Ambulatory Visit: Payer: Self-pay | Admitting: Family Medicine

## 2013-01-12 NOTE — Telephone Encounter (Signed)
Last office visit 09/13/2012.  Ok to refill?

## 2013-01-12 NOTE — Telephone Encounter (Signed)
plz phone in. 

## 2013-01-13 NOTE — Telephone Encounter (Signed)
Rx called in as directed.   

## 2013-03-11 ENCOUNTER — Ambulatory Visit (INDEPENDENT_AMBULATORY_CARE_PROVIDER_SITE_OTHER): Payer: BC Managed Care – PPO | Admitting: Internal Medicine

## 2013-03-11 ENCOUNTER — Encounter: Payer: Self-pay | Admitting: Internal Medicine

## 2013-03-11 ENCOUNTER — Emergency Department (HOSPITAL_COMMUNITY)
Admission: EM | Admit: 2013-03-11 | Discharge: 2013-03-11 | Disposition: A | Discharge status: Home / Self Care | Payer: BC Managed Care – PPO | Attending: Emergency Medicine | Admitting: Emergency Medicine

## 2013-03-11 ENCOUNTER — Encounter (HOSPITAL_COMMUNITY): Payer: Self-pay | Admitting: Emergency Medicine

## 2013-03-11 ENCOUNTER — Emergency Department (HOSPITAL_COMMUNITY): Payer: BC Managed Care – PPO

## 2013-03-11 VITALS — BP 100/70 | HR 208 | Temp 100.3°F | Wt 222.0 lb

## 2013-03-11 DIAGNOSIS — G43909 Migraine, unspecified, not intractable, without status migrainosus: Secondary | ICD-10-CM | POA: Insufficient documentation

## 2013-03-11 DIAGNOSIS — Z7982 Long term (current) use of aspirin: Secondary | ICD-10-CM | POA: Insufficient documentation

## 2013-03-11 DIAGNOSIS — I471 Supraventricular tachycardia, unspecified: Secondary | ICD-10-CM | POA: Insufficient documentation

## 2013-03-11 DIAGNOSIS — E785 Hyperlipidemia, unspecified: Secondary | ICD-10-CM | POA: Insufficient documentation

## 2013-03-11 DIAGNOSIS — I498 Other specified cardiac arrhythmias: Secondary | ICD-10-CM | POA: Insufficient documentation

## 2013-03-11 DIAGNOSIS — Z860100 Personal history of colon polyps, unspecified: Secondary | ICD-10-CM | POA: Insufficient documentation

## 2013-03-11 DIAGNOSIS — J111 Influenza due to unidentified influenza virus with other respiratory manifestations: Secondary | ICD-10-CM

## 2013-03-11 DIAGNOSIS — Z79899 Other long term (current) drug therapy: Secondary | ICD-10-CM | POA: Insufficient documentation

## 2013-03-11 DIAGNOSIS — N400 Enlarged prostate without lower urinary tract symptoms: Secondary | ICD-10-CM | POA: Insufficient documentation

## 2013-03-11 HISTORY — DX: Supraventricular tachycardia, unspecified: I47.10

## 2013-03-11 HISTORY — DX: Supraventricular tachycardia: I47.1

## 2013-03-11 LAB — CBC WITH DIFFERENTIAL/PLATELET
Basophils Absolute: 0 10*3/uL (ref 0.0–0.1)
Basophils Relative: 0 % (ref 0–1)
EOS ABS: 0.1 10*3/uL (ref 0.0–0.7)
EOS PCT: 1 % (ref 0–5)
HCT: 44.2 % (ref 39.0–52.0)
HEMOGLOBIN: 14.8 g/dL (ref 13.0–17.0)
LYMPHS ABS: 2.5 10*3/uL (ref 0.7–4.0)
LYMPHS PCT: 21 % (ref 12–46)
MCH: 27.8 pg (ref 26.0–34.0)
MCHC: 33.5 g/dL (ref 30.0–36.0)
MCV: 82.9 fL (ref 78.0–100.0)
MONOS PCT: 17 % — AB (ref 3–12)
Monocytes Absolute: 2 10*3/uL — ABNORMAL HIGH (ref 0.1–1.0)
Neutro Abs: 7.2 10*3/uL (ref 1.7–7.7)
Neutrophils Relative %: 61 % (ref 43–77)
Platelets: 291 10*3/uL (ref 150–400)
RBC: 5.33 MIL/uL (ref 4.22–5.81)
RDW: 14.1 % (ref 11.5–15.5)
WBC: 11.8 10*3/uL — AB (ref 4.0–10.5)

## 2013-03-11 LAB — BASIC METABOLIC PANEL
BUN: 12 mg/dL (ref 6–23)
CO2: 23 meq/L (ref 19–32)
Calcium: 9.1 mg/dL (ref 8.4–10.5)
Chloride: 102 mEq/L (ref 96–112)
Creatinine, Ser: 1.38 mg/dL — ABNORMAL HIGH (ref 0.50–1.35)
GFR calc Af Amer: 61 mL/min — ABNORMAL LOW (ref >90)
GFR, EST NON AFRICAN AMERICAN: 53 mL/min — AB (ref >90)
Glucose, Bld: 135 mg/dL — ABNORMAL HIGH (ref 70–99)
POTASSIUM: 4.5 meq/L (ref 3.7–5.3)
Sodium: 139 mEq/L (ref 137–147)

## 2013-03-11 LAB — URINALYSIS, ROUTINE W REFLEX MICROSCOPIC
BILIRUBIN URINE: NEGATIVE
Glucose, UA: NEGATIVE mg/dL
HGB URINE DIPSTICK: NEGATIVE
Ketones, ur: NEGATIVE mg/dL
Leukocytes, UA: NEGATIVE
Nitrite: NEGATIVE
Protein, ur: NEGATIVE mg/dL
Specific Gravity, Urine: 1.018 (ref 1.005–1.030)
UROBILINOGEN UA: 0.2 mg/dL (ref 0.0–1.0)
pH: 6 (ref 5.0–8.0)

## 2013-03-11 LAB — TROPONIN I: Troponin I: <0.3 ng/mL (ref <0.30)

## 2013-03-11 MED ORDER — ADENOSINE 6 MG/2ML IV SOLN
INTRAVENOUS | Status: AC
  Filled 2013-03-11: qty 4

## 2013-03-11 MED ORDER — ADENOSINE 6 MG/2ML IV SOLN
INTRAVENOUS | Status: AC
  Administered 2013-03-11: 6 mg
  Filled 2013-03-11: qty 4

## 2013-03-11 MED ORDER — OSELTAMIVIR PHOSPHATE 75 MG PO CAPS
75.0000 mg | ORAL_CAPSULE | Freq: Once | ORAL | Status: AC
  Administered 2013-03-11: 75 mg via ORAL
  Filled 2013-03-11: qty 1

## 2013-03-11 MED ORDER — ADENOSINE 6 MG/2ML IV SOLN
INTRAVENOUS | Status: AC
  Administered 2013-03-11: 12 mg
  Filled 2013-03-11: qty 4

## 2013-03-11 MED ORDER — OSELTAMIVIR PHOSPHATE 75 MG PO CAPS: 75.0000 mg | ORAL_CAPSULE | Freq: Two times a day (BID) | ORAL | Status: DC

## 2013-03-11 MED ORDER — SODIUM CHLORIDE 0.9 % IV BOLUS (SEPSIS)
1000.0000 mL | Freq: Once | INTRAVENOUS | Status: AC
  Administered 2013-03-11: 1000 mL via INTRAVENOUS

## 2013-03-11 MED ORDER — SODIUM CHLORIDE 0.9 % IV SOLN
INTRAVENOUS | Status: DC
  Administered 2013-03-11: 15:00:00 via INTRAVENOUS

## 2013-03-11 NOTE — ED Notes (Signed)
12 mg adenosine given.

## 2013-03-11 NOTE — ED Notes (Signed)
Pt placed in room. Placed on zoll pads and 12 lead. MD Eulis Foster at bedside. Pt AO x4. Follows commands.

## 2013-03-11 NOTE — ED Notes (Signed)
Pt here from PCP for eval of tachycardia and flu like sx; pt noted to have HR over 200 BPM and SBP 94; pt feels lightheaded and appears pale at present; EDP aware

## 2013-03-11 NOTE — Assessment & Plan Note (Signed)
Fairly typical presentation Not excited about trying tamiflu Discussed supportive care

## 2013-03-11 NOTE — Assessment & Plan Note (Addendum)
SVT with same RBBB on his baseline EKG Mild dizziness but no other symptoms Refuses to go to the ER Advised him he needs someone to drive him---no safe if he gets hypotensive  Discussed with Dr Sheliah Mends see if in office adenosine is a possibility  Call back---will not be able to see him there due to concerns about his flu Discussed with patient Wife is coming to pick him up---I advised that she take him right to [facility] for treatment---he will consider If not, told him about vagal maneuvers and taking an extra carvedilol. Then calling 911 if any chest pain or SOB

## 2013-03-11 NOTE — Progress Notes (Signed)
Subjective:    Patient ID: Alec Maldonado, male    DOB: 05/30/1950, 63 y.o.   MRN: [id number]  HPI Has had fever since yesterday Headache and chills Right ear pain Some dizziness today  Not much cough Some body aches No sore throat No SOB---other than the head congestion  Took some tylenol in night Didn't take flu shot  Did have heart rate of 200 once before Went to ER and had resolved No palpitations No chest pain  Current Outpatient Prescriptions on File Prior to Visit  Medication Sig Dispense Refill  . aspirin 81 MG tablet Take 81 mg by mouth daily.        . butalbital-acetaminophen-caffeine (FIORICET, ESGIC) 50-325-40 MG per tablet TAKE 1 TABLET BY MOUTH EVERY 6 HOURS AS NEEDED FOR HEADACHE  30 tablet  0  . carvedilol (COREG) 6.25 MG tablet Take 1 tablet (6.25 mg total) by mouth 2 (two) times daily with a meal.  180 tablet  3  . ezetimibe-simvastatin (VYTORIN) 10-80 MG per tablet Take 1 tablet by mouth at bedtime.  30 tablet  11  . finasteride (PROSCAR) 5 MG tablet TAKE 1 TABLET BY MOUTH DAILY.  90 tablet  3  . gabapentin (NEURONTIN) 300 MG capsule TAKE 1 CAPSULE BY MOUTH THREE TIMES A DAY  90 capsule  3  . tamsulosin (FLOMAX) 0.4 MG CAPS TAKE 1 CAPSULE BY MOUTH AT BEDTIME  90 capsule  3   No current facility-administered medications on file prior to visit.    No Known Allergies  Past Medical History  Diagnosis Date  . Dyslipidemia   . Mitral regurgitation     Mitral valve repair 2001  . BPH (benign prostatic hypertrophy)   . Dyslipidemia   . Chest discomfort     nuclear 06/24/09, suggestion prior small inferior MI with moderate peri-infarvt ischemia versus variable diaphragmatic attenuation  . Palpitations 07/2009     helped with carvedilol   . Postural dizziness     mild, no orthostatic changes by blood pressure check  . Shortness of breath   . Colon polyps     colonoscopy 07/02/06, repeat in 10 years  . Migraines   . Pleurisy   . Status post mitral valve  repair     2001,  //  echo, October, 2010, good function of the repaired mitral valve  . Ejection fraction     EF 55%, echo, October, 2010  //   EF 45-50%,  catheterization, 2011  . Pre-syncope     January, 2014  . RBBB     First seen January, 2014  . Aortic insufficiency     Mild, echo, January, 2014  . Right ventricular dysfunction     Mild/moderate right ventricular dysfunction by echo, January, 2014, no tricuspid regurgitation, therefore right heart pressure could not be estimated.    Past Surgical History  Procedure Laterality Date  . Thyroid surgery    . Mitral valve repair  01/22/00    good mitral valve function- echo 12/2008- EF 55% / nuclear 06/2009- EF 36%,? reliable? / EF 45-50%, cath rated 02/2010  . Aneurysmal dilatation      proximal LAD cath 2001, reoeat catheter 2004, no significant change  . Aneurysmal dilatiation  07/15/09    no change, normal right heart filling pressures and LVEDP- EF 45-50%  . Appendectomy  1970  . Thyroidectomy      left, due to goiter 1994  . Orif elbow fracture      left  .  Coronary angioplasty  09/1999    severe MR o/w normal limits  . Coronary angioplasty  12/19/02    pos. CAD, mild LV dysfunction- med treatment  . Hernia repair  01/11/05    left, Dr. Hassell Done  . Cardiac catheterization  2010    mild global LV dysfunction EF 45-50%, no significant CAD obstruction, aneurysmal dilatation LAD    Family History  Problem Relation Age of Onset  . Alzheimer's disease Mother   . Heart disease Mother     CHF  . Arthritis Father   . Heart disease Father     leaking mitral valve  . Benign prostatic hyperplasia Father   . Heart disease Brother     murmur  . Hypertension Neg Hx   . Diabetes Neg Hx   . Depression Neg Hx   . Alcohol abuse Neg Hx   . Drug abuse Neg Hx   . Stroke Neg Hx     History   Social History  . Marital Status: Married    Spouse Name: N/A    Number of Children: 2  . Years of Education: N/A   Occupational  History  . repairs copiers    Social History Main Topics  . Smoking status: Never Smoker   . Smokeless tobacco: Never Used  . Alcohol Use: No  . Drug Use: No  . Sexual Activity: Not on file   Other Topics Concern  . Not on file   Social History Narrative   Widowed, wife deceased colon cancer 1996/07/23, remarried 2001, lives with remarried wife   Review of Systems No vomiting or diarrhea Sleep has been affected by the fever Appetite is okay    Objective:   Physical Exam  Constitutional: He appears well-developed.  Looks mildly ill but not in distress  HENT:  Mouth/Throat: Oropharynx is clear and moist. No oropharyngeal exudate.  Moderate nasal inflammation TMs normal  Neck: Normal range of motion. Neck supple.  Cardiovascular: Regular rhythm and normal heart sounds.  Exam reveals no gallop.   No murmur heard. Rate ~190  Pulmonary/Chest: Effort normal and breath sounds normal. No respiratory distress. He has no wheezes. He has no rales.  Lymphadenopathy:    He has no cervical adenopathy.  Skin: No rash noted.          Assessment & Plan:

## 2013-03-11 NOTE — ED Provider Notes (Signed)
CSN: [id number]     Arrival date & time 03/11/13  35 History   First MD Initiated Contact with Patient 03/11/13 1501     Chief Complaint  Patient presents with  . Tachycardia   (Consider location/radiation/quality/duration/timing/severity/associated sxs/prior Treatment) HPI Comments: Alec Maldonado is a 63 y.o. male here for evaluation of rapid heartbeat. Patient saw his PCP, today for evaluation of fever. He was noted to be tachycardic, and was sent here for evaluation and treatment. His PCP thought that he had influenza because of fever and chills with some right ear pain, for 2 days.  He denies chest pain. He feels weak. He took Tylenol yesterday, for fever, but none today. He is not sure when the palpitations began. No history of arterial infarction, congestive heart failure or cardiac surgery. He does have history of mitral valve replacement. He has no known sick contacts. There are no other known modifying factors.   The history is provided by the patient.    Past Medical History  Diagnosis Date  . Dyslipidemia   . Mitral regurgitation     Mitral valve repair 2001  . BPH (benign prostatic hypertrophy)   . Dyslipidemia   . Chest discomfort     nuclear 06/24/09, suggestion prior small inferior MI with moderate peri-infarvt ischemia versus variable diaphragmatic attenuation  . Palpitations 07/2009     helped with carvedilol   . Postural dizziness     mild, no orthostatic changes by blood pressure check  . Shortness of breath   . Colon polyps     colonoscopy 07/02/06, repeat in 10 years  . Migraines   . Pleurisy   . Status post mitral valve repair     2001,  //  echo, October, 2010, good function of the repaired mitral valve  . Ejection fraction     EF 55%, echo, October, 2010  //   EF 45-50%,  catheterization, 2011  . Pre-syncope     January, 2014  . RBBB     First seen January, 2014  . Aortic insufficiency     Mild, echo, January, 2014  . Right ventricular dysfunction      Mild/moderate right ventricular dysfunction by echo, January, 2014, no tricuspid regurgitation, therefore right heart pressure could not be estimated.   Past Surgical History  Procedure Laterality Date  . Thyroid surgery    . Mitral valve repair  01/22/00    good mitral valve function- echo 12/2008- EF 55% / nuclear 06/2009- EF 36%,? reliable? / EF 45-50%, cath rated 02/2010  . Aneurysmal dilatation      proximal LAD cath 2001, reoeat catheter 2004, no significant change  . Aneurysmal dilatiation  07/15/09    no change, normal right heart filling pressures and LVEDP- EF 45-50%  . Appendectomy  1970  . Thyroidectomy      left, due to goiter 1994  . Orif elbow fracture      left  . Coronary angioplasty  09/1999    severe MR o/w normal limits  . Coronary angioplasty  12/19/02    pos. CAD, mild LV dysfunction- med treatment  . Hernia repair  01/11/05    left, Dr. Hassell Done  . Cardiac catheterization  2010    mild global LV dysfunction EF 45-50%, no significant CAD obstruction, aneurysmal dilatation LAD   Family History  Problem Relation Age of Onset  . Alzheimer's disease Mother   . Heart disease Mother     CHF  . Arthritis Father   .  Heart disease Father     leaking mitral valve  . Benign prostatic hyperplasia Father   . Heart disease Brother     murmur  . Hypertension Neg Hx   . Diabetes Neg Hx   . Depression Neg Hx   . Alcohol abuse Neg Hx   . Drug abuse Neg Hx   . Stroke Neg Hx    History  Substance Use Topics  . Smoking status: Never Smoker   . Smokeless tobacco: Never Used  . Alcohol Use: No    Review of Systems  All other systems reviewed and are negative.    Allergies  Review of patient's allergies indicates no known allergies.  Home Medications   Current Outpatient Rx  Name  Route  Sig  Dispense  Refill  . aspirin 81 MG tablet   Oral   Take 81 mg by mouth daily.           . butalbital-acetaminophen-caffeine (FIORICET, ESGIC) 50-325-40 MG per tablet       TAKE 1 TABLET BY MOUTH EVERY 6 HOURS AS NEEDED FOR HEADACHE   30 tablet   0   . carvedilol (COREG) 6.25 MG tablet   Oral   Take 1 tablet (6.25 mg total) by mouth 2 (two) times daily with a meal.   180 tablet   3   . ezetimibe-simvastatin (VYTORIN) 10-80 MG per tablet   Oral   Take 1 tablet by mouth at bedtime.   30 tablet   11   . finasteride (PROSCAR) 5 MG tablet      TAKE 1 TABLET BY MOUTH DAILY.   90 tablet   3   . gabapentin (NEURONTIN) 300 MG capsule      TAKE 1 CAPSULE BY MOUTH THREE TIMES A DAY         . tamsulosin (FLOMAX) 0.4 MG CAPS      TAKE 1 CAPSULE BY MOUTH AT BEDTIME   90 capsule   3   . oseltamivir (TAMIFLU) 75 MG capsule   Oral   Take 1 capsule (75 mg total) by mouth every 12 (twelve) hours.   10 capsule   0    BP 110/76  Pulse 91  Temp(Src) 100.2 F (37.9 C) (Oral)  Resp 24  SpO2 96% Physical Exam  Nursing note and vitals reviewed. Constitutional: He is oriented to person, place, and time. He appears well-developed and well-nourished.  HENT:  Head: Normocephalic and atraumatic.  Right Ear: External ear normal.  Left Ear: External ear normal.  Eyes: Conjunctivae and EOM are normal. Pupils are equal, round, and reactive to light.  Neck: Normal range of motion and phonation normal. Neck supple.  Cardiovascular: Intact distal pulses.   Tachycardia, regular  Pulmonary/Chest: Effort normal and breath sounds normal. No respiratory distress. He exhibits no bony tenderness.  Abdominal: Soft. Normal appearance. There is no tenderness.  Musculoskeletal: Normal range of motion. He exhibits no edema and no tenderness.  Neurological: He is alert and oriented to person, place, and time. No cranial nerve deficit or sensory deficit. He exhibits normal muscle tone. Coordination normal.  Skin: Skin is warm, dry and intact.  Psychiatric: He has a normal mood and affect. His behavior is normal. Judgment and thought content normal.    ED Course   Procedures (including critical care time) Medications  adenosine (ADENOCARD) 6 MG/2ML injection (not administered)  0.9 %  sodium chloride infusion ( Intravenous Stopped 03/11/13 1615)  adenosine (ADENOCARD) 6 MG/2ML injection (12 mg  Given 03/11/13 1459)  adenosine (ADENOCARD) 6 MG/2ML injection (6 mg  Given 03/11/13 1456)  sodium chloride 0.9 % bolus 1,000 mL (0 mLs Intravenous Stopped 03/11/13 1615)  oseltamivir (TAMIFLU) capsule 75 mg (75 mg Oral Given 03/11/13 1727)    Patient Vitals for the past 24 hrs:  BP Temp Temp src Pulse Resp SpO2  03/11/13 1718 110/76 mmHg 100.2 F (37.9 C) Oral 91 24 96 %  03/11/13 1645 104/66 mmHg - - 94 23 96 %  03/11/13 1630 110/70 mmHg - - 100 17 97 %  03/11/13 1615 112/67 mmHg - - 100 26 95 %  03/11/13 1600 114/69 mmHg - - 99 29 97 %  03/11/13 1545 121/69 mmHg - - 100 28 98 %  03/11/13 1530 124/67 mmHg - - 96 27 100 %  03/11/13 1515 124/68 mmHg - - 98 28 96 %  03/11/13 1500 114/80 mmHg - - 102 18 100 %  03/11/13 1500 114/80 mmHg - - 107 25 99 %  03/11/13 1459 102/71 mmHg - - 189 - 98 %  03/11/13 1455 93/67 mmHg - - 189 21 97 %  03/11/13 1454 94/59 mmHg 98 F (36.7 C) Oral 215 20 97 %   Procedure- chemical cardioversion. He was treated with 6 mg, then 10 mg of adenosine. He converted to normal sinus rhythm and had improvement of his blood pressure. He felt better immediately. No complications. 15:05  4:56 PM Reevaluation with update and discussion. After initial assessment and treatment, an updated evaluation reveals no recurrence of tachycardia Blood pressure normal. He remains comfortable. He, states now, that he last had an episode of SVT, 4 years ago. Dionisio Aragones L    CRITICAL CARE Performed by: Daleen Bo L Total critical care time: 40 minutes  Critical care time was exclusive of separately billable procedures and treating other patients. Critical care was necessary to treat or prevent imminent or life-threatening deterioration. Critical  care was time spent personally by me on the following activities: development of treatment plan with patient and/or surrogate as well as nursing, discussions with consultants, evaluation of patient's response to treatment, examination of patient, obtaining history from patient or surrogate, ordering and performing treatments and interventions, ordering and review of laboratory studies, ordering and review of radiographic studies, pulse oximetry and re-evaluation of patient's condition.   Labs Review Labs Reviewed  CBC WITH DIFFERENTIAL - Abnormal; Notable for the following:    WBC 11.8 (*)    Monocytes Relative 17 (*)    Monocytes Absolute 2.0 (*)    All other components within normal limits  BASIC METABOLIC PANEL - Abnormal; Notable for the following:    Glucose, Bld 135 (*)    Creatinine, Ser 1.38 (*)    GFR calc non Af Amer 53 (*)    GFR calc Af Amer 61 (*)    All other components within normal limits  URINALYSIS, ROUTINE W REFLEX MICROSCOPIC - Abnormal; Notable for the following:    APPearance CLOUDY (*)    All other components within normal limits  URINE CULTURE  TROPONIN I   Imaging Review Dg Chest Port 1 View  03/11/2013   CLINICAL DATA:  Fever, tachycardia, history palpitations, mitral valve repair  EXAM: PORTABLE CHEST - 1 VIEW  COMPARISON:  Portable exam 1515 hr compared to 01/18/2009  FINDINGS: External pacing leads.  Enlargement of cardiac silhouette post median sternotomy.  Mediastinal contours and pulmonary vascularity normal.  Minimal bibasilar atelectasis.  Lungs otherwise clear.  No pleural effusion or pneumothorax.  No acute osseous findings.  IMPRESSION: Minimal enlargement of cardiac silhouette post median sternotomy.  Bibasilar atelectasis.   Electronically Signed   By: Lavonia Dana M.D.   On: 03/11/2013 15:26    EKG Interpretation    Date/Time:  Tuesday March 11 2013 14:39:19 EST Ventricular Rate:  215 PR Interval:    QRS Duration: 124 QT Interval:  260 QTC  Calculation: 491 R Axis:   109 Text Interpretation:  Wide QRS tachycardia with Fusion complexes Right bundle branch block Abnormal ECG Confirmed by Moneisha Vosler  MD, Jonalyn Sedlak ([id number]) on 03/11/2013 5:35:27 PM            MDM   1. SVT (supraventricular tachycardia)   2. Influenza      SVT, likely secondary to influenza and fever. He had improvement with adenosine, no recurrence. He is currently on a beta blocker, at home. There is no indication for further evaluation, and treatment, today. Doubt ACS, PE, or pneumonia.  The patient appears reasonably screened and/or stabilized for discharge and I doubt any other medical condition or other [facility] requiring further screening, evaluation, or treatment in the ED at this time prior to discharge.   Nursing Notes Reviewed/ Care Coordinated, and agree without changes. Applicable Imaging Reviewed.  Interpretation of Laboratory Data incorporated into ED treatment   Plan: Home Medications- Tamiflu; Home Treatments and Observation- rest, fluids; return here if the recommended treatment, does not improve the symptoms; Recommended follow up- PCP, for check up in 1 week.    Richarda Blade, MD 03/11/13 1736

## 2013-03-11 NOTE — Discharge Instructions (Signed)
Take Tylenol, for fever Drink plenty of fluids.     Influenza, Adult Influenza ("the flu") is a viral infection of the respiratory tract. It occurs more often in winter months because people spend more time in close contact with one another. Influenza can make you feel very sick. Influenza easily spreads from person to person (contagious). CAUSES  Influenza is caused by a virus that infects the respiratory tract. You can catch the virus by breathing in droplets from an infected person's cough or sneeze. You can also catch the virus by touching something that was recently contaminated with the virus and then touching your mouth, nose, or eyes. SYMPTOMS  Symptoms typically last 4 to 10 days and may include:  Fever.  Chills.  Headache, body aches, and muscle aches.  Sore throat.  Chest discomfort and cough.  Poor appetite.  Weakness or feeling tired.  Dizziness.  Nausea or vomiting. DIAGNOSIS  Diagnosis of influenza is often made based on your history and a physical exam. A nose or throat swab test can be done to confirm the diagnosis. RISKS AND COMPLICATIONS You may be at risk for a more severe case of influenza if you smoke cigarettes, have diabetes, have chronic heart disease (such as heart failure) or lung disease (such as asthma), or if you have a weakened immune system. Elderly people and pregnant women are also at risk for more serious infections. The most common complication of influenza is a lung infection (pneumonia). Sometimes, this complication can require emergency medical care and may be life-threatening. PREVENTION  An annual influenza vaccination (flu shot) is the best way to avoid getting influenza. An annual flu shot is now routinely recommended for all adults in the [place] TREATMENT  In mild cases, influenza goes away on its own. Treatment is directed at relieving symptoms. For more severe cases, your caregiver may prescribe antiviral medicines to shorten the  sickness. Antibiotic medicines are not effective, because the infection is caused by a virus, not by bacteria. HOME CARE INSTRUCTIONS  Only take over-the-counter or prescription medicines for pain, discomfort, or fever as directed by your caregiver.  Use a cool mist humidifier to make breathing easier.  Get plenty of rest until your temperature returns to normal. This usually takes 3 to 4 days.  Drink enough fluids to keep your urine clear or pale yellow.  Cover your mouth and nose when coughing or sneezing, and wash your hands well to avoid spreading the virus.  Stay home from work or school until your fever has been gone for at least 1 full day. SEEK MEDICAL CARE IF:   You have chest pain or a deep cough that worsens or produces more mucus.  You have nausea, vomiting, or diarrhea. SEEK IMMEDIATE MEDICAL CARE IF:   You have difficulty breathing, shortness of breath, or your skin or nails turn bluish.  You have severe neck pain or stiffness.  You have a severe headache, facial pain, or earache.  You have a worsening or recurring fever.  You have nausea or vomiting that cannot be controlled. MAKE SURE YOU:  Understand these instructions.  Will watch your condition.  Will get help right away if you are not doing well or get worse. Document Released: 02/18/2000 Document Revised: 08/22/2011 Document Reviewed: 05/22/2011 [facility] Patient Information 2014 [place], [place].  Supraventricular Tachycardia Supraventricular tachycardia (SVT) is an abnormal heart rhythm (arrhythmia) that causes the heart to beat very fast (tachycardia). This kind of fast heartbeat originates in the upper chambers of the  heart (atria). SVT can cause the heart to beat greater than 100 beats per minute. SVT can have a rapid burst of heartbeats. This can start and stop suddenly without warning and is called nonsustained. SVT can also be sustained, in which the heart beats at a continuous fast rate.  CAUSES    There can be different causes of SVT. Some of these include:  Heart valve problems such as mitral valve prolapse.  An enlarged heart (hypertrophic cardiomyopathy).  Congenital heart problems.  Heart inflammation (pericarditis).  Hyperthyroidism.  Low potassium or magnesium levels.  Caffeine.  Drug use such as cocaine, methamphetamines, or stimulants.  Some over-the-counter medicines such as:  Decongestants.  Diet medicines.  Herbal medicines. SYMPTOMS  Symptoms of SVT can vary. Symptoms depend on whether the SVT is sustained or nonsustained. You may experience:  No symptoms (asymptomatic).  An awareness of your heart beating rapidly (palpitations).  Shortness of breath.  Chest pain or pressure. If your blood pressure drops because of the SVT, you may experience:  Fainting or near fainting.  Weakness.  Dizziness. DIAGNOSIS  Different tests can be performed to diagnose SVT, such as:  An electrocardiogram (EKG). This is a painless test that records the electrical activity of your heart.  Holter monitor. This is a 24 hour recording of your heart rhythm. You will be given a diary. Write down all symptoms that you have and what you were doing at the time you experienced symptoms.  Arrhythmia monitor. This is a small device that your wear for several weeks. It records the heart rhythm when you have symptoms.  Echocardiogram. This is an imaging test to help detect abnormal heart structure such as congenital abnormalities, heart valve problems, or heart enlargement.  Stress test. This test can help determine if the SVT is related to exercise.  Electrophysiology study (EPS). This is a procedure that evaluates your heart's electrical system and can help your caregiver find the cause of your SVT. TREATMENT  Treatment of SVT depends on the symptoms, how often it recurs, and whether there are any underlying heart problems.   If symptoms are rare and no other cardiac  disease is present, no treatment may be needed.  Blood work may be done to check potassium, magnesium, and thyroid hormone levels to see if they are abnormal. If these levels are abnormal, treatment to correct the problems will occur. Medicines Your caregiver may use oral medicines to treat SVT. These medicines are given for long-term control of SVT. Medicines may be used alone or in combination with other treatments. These medicines work to slow nerve impulses in the heart muscle. These medicines can also be used to treat high blood pressure. Some of these medicines may include:  Calcium channel blockers.  Beta blockers.  Digoxin. Nonsurgical procedures Nonsurgical techniques may be used if oral medicines do not work. Some examples include:  Cardioversion. This technique uses either drugs or an electrical shock to restore a normal heart rhythm.  Cardioversion drugs may be given through an intravenous (IV) line to help "reset" the heart rhythm.  In electrical cardioversion, the caregiver shocks your heart to stop its beat for a split second. This helps to reset the heart to a normal rhythm.  Ablation. This procedure is done under mild sedation. High frequency radio wave energy is used to destroy the area of heart tissue responsible for the SVT. HOME CARE INSTRUCTIONS   Do not smoke.  Only take medicines prescribed by your caregiver. Check with your caregiver  before using over-the-counter medicines.  Check with your caregiver about how much alcohol and caffeine (coffee, tea, colas, or chocolate) you may have.  It is very important to keep all follow-up referrals and appointments in order to properly manage this problem. SEEK IMMEDIATE MEDICAL CARE IF:  You have dizziness.  You faint or nearly faint.  You have shortness of breath.  You have chest pain or pressure.  You have sudden nausea or vomiting.  You have profuse sweating.  You are concerned about how long your symptoms  last.  You are concerned about the frequency of your SVT episodes. If you have the above symptoms, call your local emergency services (911 in [place] immediately. Do not drive yourself to the hospital. MAKE SURE YOU:   Understand these instructions.  Will watch your condition.  Will get help right away if you are not doing well or get worse. Document Released: 02/20/2005 Document Revised: 05/15/2011 Document Reviewed: 06/04/2008 [facility] Patient Information 2014 [place], [place].

## 2013-03-11 NOTE — ED Notes (Signed)
Pt given 6 adenosine. HR remains at 196

## 2013-03-12 LAB — URINE CULTURE
CULTURE: NO GROWTH
Colony Count: NO GROWTH

## 2013-05-08 ENCOUNTER — Ambulatory Visit (INDEPENDENT_AMBULATORY_CARE_PROVIDER_SITE_OTHER): Payer: BC Managed Care – PPO | Admitting: Internal Medicine

## 2013-05-08 ENCOUNTER — Encounter: Payer: Self-pay | Admitting: Internal Medicine

## 2013-05-08 VITALS — BP 126/84 | HR 71 | Temp 98.7°F | Wt 219.0 lb

## 2013-05-08 DIAGNOSIS — B349 Viral infection, unspecified: Secondary | ICD-10-CM

## 2013-05-08 DIAGNOSIS — G43909 Migraine, unspecified, not intractable, without status migrainosus: Secondary | ICD-10-CM

## 2013-05-08 DIAGNOSIS — B9789 Other viral agents as the cause of diseases classified elsewhere: Secondary | ICD-10-CM

## 2013-05-08 MED ORDER — METHYLPREDNISOLONE ACETATE 40 MG/ML IJ SUSP
40.0000 mg | Freq: Once | INTRAMUSCULAR | Status: AC
  Administered 2013-05-08: 40 mg via INTRAMUSCULAR

## 2013-05-08 MED ORDER — KETOROLAC TROMETHAMINE 30 MG/ML IJ SOLN
30.0000 mg | Freq: Once | INTRAMUSCULAR | Status: AC
  Administered 2013-05-08: 30 mg via INTRAMUSCULAR

## 2013-05-08 MED ORDER — KETOROLAC TROMETHAMINE 30 MG/ML IJ SOLN: 30.0000 mg | Freq: Once | INTRAMUSCULAR | Status: DC

## 2013-05-08 NOTE — Progress Notes (Signed)
Pre visit review using our clinic review tool, if applicable. No additional management support is needed unless otherwise documented below in the visit note. 

## 2013-05-08 NOTE — Patient Instructions (Addendum)
Migraine Headache A migraine headache is an intense, throbbing pain on one or both sides of your head. A migraine can last for 30 minutes to several hours. CAUSES  The exact cause of a migraine headache is not always known. However, a migraine may be caused when nerves in the brain become irritated and release chemicals that cause inflammation. This causes pain. Certain things may also trigger migraines, such as:  Alcohol.  Smoking.  Stress.  Menstruation.  Aged cheeses.  Foods or drinks that contain nitrates, glutamate, aspartame, or tyramine.  Lack of sleep.  Chocolate.  Caffeine.  Hunger.  Physical exertion.  Fatigue.  Medicines used to treat chest pain (nitroglycerine), birth control pills, estrogen, and some blood pressure medicines. SIGNS AND SYMPTOMS  Pain on one or both sides of your head.  Pulsating or throbbing pain.  Severe pain that prevents daily activities.  Pain that is aggravated by any physical activity.  Nausea, vomiting, or both.  Dizziness.  Pain with exposure to bright lights, loud noises, or activity.  General sensitivity to bright lights, loud noises, or smells. Before you get a migraine, you may get warning signs that a migraine is coming (aura). An aura may include:  Seeing flashing lights.  Seeing bright spots, halos, or zig-zag lines.  Having tunnel vision or blurred vision.  Having feelings of numbness or tingling.  Having trouble talking.  Having muscle weakness. DIAGNOSIS  A migraine headache is often diagnosed based on:  Symptoms.  Physical exam.  A CT scan or MRI of your head. These imaging tests cannot diagnose migraines, but they can help rule out other causes of headaches. TREATMENT Medicines may be given for pain and nausea. Medicines can also be given to help prevent recurrent migraines.  HOME CARE INSTRUCTIONS  Only take over-the-counter or prescription medicines for pain or discomfort as directed by your  health care provider. The use of long-term narcotics is not recommended.  Lie down in a dark, quiet room when you have a migraine.  Keep a journal to find out what may trigger your migraine headaches. For example, write down:  What you eat and drink.  How much sleep you get.  Any change to your diet or medicines.  Limit alcohol consumption.  Quit smoking if you smoke.  Get 7 9 hours of sleep, or as recommended by your health care provider.  Limit stress.  Keep lights dim if bright lights bother you and make your migraines worse. SEEK IMMEDIATE MEDICAL CARE IF:   Your migraine becomes severe.  You have a fever.  You have a stiff neck.  You have vision loss.  You have muscular weakness or loss of muscle control.  You start losing your balance or have trouble walking.  You feel faint or pass out.  You have severe symptoms that are different from your first symptoms. MAKE SURE YOU:   Understand these instructions.  Will watch your condition.  Will get help right away if you are not doing well or get worse. Document Released: 02/20/2005 Document Revised: 12/11/2012 Document Reviewed: 10/28/2012 [employer] Patient Information 2014 [employer].  

## 2013-05-08 NOTE — Addendum Note (Signed)
Addended by: Lurlean Nanny on: 05/08/2013 03:05 PM   Modules accepted: Orders

## 2013-05-08 NOTE — Progress Notes (Signed)
HPI  Pt presents to the clinic today with c/o headache, fever and body aches. He reports this started yesterday. His fever has been as high as 100.5. The headache feels like the worse part. He does have a history of migraines. He has taken fioricet without much relief. He has not had sick contacts that he is aware of. He has had the flu shot.  Of note, he had similar symptoms in January and was diagnosed with the flu. He was treated with tamiflu but reports he was never tested for the flu.  Review of Systems      Past Medical History  Diagnosis Date  . Dyslipidemia   . Mitral regurgitation     Mitral valve repair 2001  . BPH (benign prostatic hypertrophy)   . Dyslipidemia   . Chest discomfort     nuclear 06/24/09, suggestion prior small inferior MI with moderate peri-infarvt ischemia versus variable diaphragmatic attenuation  . Palpitations 2009-08-01     helped with carvedilol   . Postural dizziness     mild, no orthostatic changes by blood pressure check  . Shortness of breath   . Colon polyps     colonoscopy 07/02/06, repeat in 10 years  . Migraines   . Pleurisy   . Status post mitral valve repair     2001,  //  echo, October, 2010, good function of the repaired mitral valve  . Ejection fraction     EF 55%, echo, October, 2010  //   EF 45-50%,  catheterization, 2011  . Pre-syncope     January, 2014  . RBBB     First seen January, 2014  . Aortic insufficiency     Mild, echo, January, 2014  . Right ventricular dysfunction     Mild/moderate right ventricular dysfunction by echo, January, 2014, no tricuspid regurgitation, therefore right heart pressure could not be estimated.    Family History  Problem Relation Age of Onset  . Alzheimer's disease Mother   . Heart disease Mother     CHF  . Arthritis Father   . Heart disease Father     leaking mitral valve  . Benign prostatic hyperplasia Father   . Heart disease Brother     murmur  . Hypertension Neg Hx   . Diabetes Neg  Hx   . Depression Neg Hx   . Alcohol abuse Neg Hx   . Drug abuse Neg Hx   . Stroke Neg Hx     History   Social History  . Marital Status: Married    Spouse Name: N/A    Number of Children: 2  . Years of Education: N/A   Occupational History  . repairs copiers    Social History Main Topics  . Smoking status: Never Smoker   . Smokeless tobacco: Never Used  . Alcohol Use: No  . Drug Use: No  . Sexual Activity: Not on file   Other Topics Concern  . Not on file   Social History Narrative   Widowed, wife deceased colon cancer 08-01-96, remarried 2001, lives with remarried wife    No Known Allergies   Constitutional: Positive headache, fatigue and fever. Denies abrupt weight changes.  HEENT:  Positive sore throat. Denies eye redness, eye pain, pressure behind the eyes, facial pain, nasal congestion, ear pain, ringing in the ears, wax buildup, runny nose or bloody nose. Respiratory: Denies cough,difficulty breathing or shortness of breath.  Cardiovascular: Denies chest pain, chest tightness, palpitations or swelling in the hands  or feet.   No other specific complaints in a complete review of systems (except as listed in HPI above).  Objective:   BP 126/84  Pulse 71  Temp(Src) 98.7 F (37.1 C) (Oral)  Wt 219 lb (99.338 kg)  SpO2 98% Wt Readings from Last 3 Encounters:  05/08/13 219 lb (99.338 kg)  03/11/13 222 lb (100.699 kg)  09/13/12 217 lb 8 oz (98.657 kg)     General: Appears his stated age, well developed, well nourished in NAD. HEENT: Head: normal shape and size; Eyes: sclera white, no icterus, conjunctiva pink, PERRLA and EOMs intact; Ears: Tm's gray and intact, normal light reflex; Nose: mucosa pink and moist, septum midline; Throat/Mouth: + PND. Teeth present, mucosa erythematous and moist, no exudate noted, no lesions or ulcerations noted.  Neck: Neck supple, trachea midline. No massses, lumps or thyromegaly present.  Cardiovascular: Normal rate and rhythm.  S1,S2 noted.  No murmur, rubs or gallops noted. No JVD or BLE edema. No carotid bruits noted. Pulmonary/Chest: Normal effort and positive vesicular breath sounds. No respiratory distress. No wheezes, rales or ronchi noted.      Assessment & Plan:   Viral illness:  Rapid flu: negative Get some rest and drink plenty of water Do salt water gargles for the sore throat Try some Ibuprofen 600 mg OTC to see if this improves the headache/body aches.   Migraine:   80 mg Depo IM today 30 mg Toradol IM today Drink plenty of fluids to avoid dehydration  RTC as needed or if symptoms persist.

## 2013-05-12 ENCOUNTER — Encounter: Payer: Self-pay | Admitting: *Deleted

## 2013-05-12 ENCOUNTER — Ambulatory Visit: Payer: BC Managed Care – PPO | Admitting: Internal Medicine

## 2013-05-12 ENCOUNTER — Ambulatory Visit (INDEPENDENT_AMBULATORY_CARE_PROVIDER_SITE_OTHER): Payer: BC Managed Care – PPO | Admitting: Family Medicine

## 2013-05-12 ENCOUNTER — Encounter: Payer: Self-pay | Admitting: Family Medicine

## 2013-05-12 VITALS — BP 128/78 | HR 83 | Temp 98.0°F | Wt 224.8 lb

## 2013-05-12 DIAGNOSIS — J209 Acute bronchitis, unspecified: Secondary | ICD-10-CM

## 2013-05-12 DIAGNOSIS — J22 Unspecified acute lower respiratory infection: Secondary | ICD-10-CM | POA: Insufficient documentation

## 2013-05-12 MED ORDER — AZITHROMYCIN 250 MG PO TABS
ORAL_TABLET | ORAL | Status: DC
Start: 2013-05-12 — End: 2013-08-28

## 2013-05-12 MED ORDER — HYDROCOD POLST-CHLORPHEN POLST 10-8 MG/5ML PO LQCR: 5.0000 mL | Freq: Every evening | ORAL | Status: DC | PRN

## 2013-05-12 NOTE — Progress Notes (Signed)
Pre visit review using our clinic review tool, if applicable. No additional management support is needed unless otherwise documented below in the visit note. 

## 2013-05-12 NOTE — Assessment & Plan Note (Signed)
Deteriorated. Given cardiac comorbidities, will treat aggressively with zpack. Red flags to return or seek urgent care discussed. tussionex works well so this was prescribed. Work note for next 2 days provided.

## 2013-05-12 NOTE — Progress Notes (Signed)
BP 128/78  Pulse 83  Temp(Src) 98 F (36.7 C) (Oral)  Wt 224 lb 12.8 oz (101.969 kg)  SpO2 96%   CC: "pneumonia"  Subjective:    Patient ID: Alec Maldonado, male    DOB: 1950-07-15, 63 y.o.   MRN: [id number]  HPI: TAJAH SCHREINER is a 63 y.o. male presenting on 05/12/2013 with Pneumonia   Recently seen by Rollene Fare last week, dx with migraine and viral illness and treated with depo medrol and toradol shots.  Ongoing cough for last 4-5 days, productive cough of dark green mucous, and low grade temperature.  trouble laying down 2/2 cough.  abd pain with coughing.  Eyes burning, headache with cough.  Congested in head and chest.    No dyspnea or wheeze.  No nausea, ear or tooth pain.  No PNDrainage.  No body aches. Treating with tylenol, last was 2am this morning.  No sick contacts. No smokers at home. No h/o asthma or COPD  Relevant past medical, surgical, family and social history reviewed and updated as indicated.  Allergies and medications reviewed and updated. Current Outpatient Prescriptions on File Prior to Visit  Medication Sig  . aspirin 81 MG tablet Take 81 mg by mouth daily.    . butalbital-acetaminophen-caffeine (FIORICET, ESGIC) 50-325-40 MG per tablet TAKE 1 TABLET BY MOUTH EVERY 6 HOURS AS NEEDED FOR HEADACHE  . carvedilol (COREG) 6.25 MG tablet Take 1 tablet (6.25 mg total) by mouth 2 (two) times daily with a meal.  . ezetimibe-simvastatin (VYTORIN) 10-80 MG per tablet Take 1 tablet by mouth at bedtime.  . finasteride (PROSCAR) 5 MG tablet TAKE 1 TABLET BY MOUTH DAILY.  Marland Kitchen gabapentin (NEURONTIN) 300 MG capsule TAKE 1 CAPSULE BY MOUTH THREE TIMES A DAY  . tamsulosin (FLOMAX) 0.4 MG CAPS TAKE 1 CAPSULE BY MOUTH AT BEDTIME   No current facility-administered medications on file prior to visit.    Review of Systems Per HPI unless specifically indicated above    Objective:    BP 128/78  Pulse 83  Temp(Src) 98 F (36.7 C) (Oral)  Wt 224 lb 12.8 oz (101.969 kg)  SpO2  96%  Physical Exam  Nursing note and vitals reviewed. Constitutional: He appears well-developed and well-nourished. No distress.  Tired apperaing  HENT:  Head: Normocephalic and atraumatic.  Right Ear: Hearing, tympanic membrane, external ear and ear canal normal.  Left Ear: Hearing, tympanic membrane, external ear and ear canal normal.  Nose: Nose normal. No mucosal edema or rhinorrhea. Right sinus exhibits no maxillary sinus tenderness and no frontal sinus tenderness. Left sinus exhibits no maxillary sinus tenderness and no frontal sinus tenderness.  Mouth/Throat: Uvula is midline, oropharynx is clear and moist and mucous membranes are normal. No oropharyngeal exudate, posterior oropharyngeal edema, posterior oropharyngeal erythema or tonsillar abscesses.  Eyes: Conjunctivae and EOM are normal. Pupils are equal, round, and reactive to light. No scleral icterus.  Neck: Normal range of motion. Neck supple.  Cardiovascular: Normal rate, regular rhythm, normal heart sounds and intact distal pulses.   No murmur heard. Pulmonary/Chest: Effort normal. No respiratory distress. He has no wheezes. He has rhonchi (scattered). He has no rales.  Coarse bilaterally  Lymphadenopathy:    He has no cervical adenopathy.  Skin: Skin is warm and dry. No rash noted.       Assessment & Plan:   Problem List Items Addressed This Visit   Acute bronchitis - Primary     Deteriorated. Given cardiac comorbidities, will treat aggressively  with zpack. Red flags to return or seek urgent care discussed. tussionex works well so this was prescribed. Work note for next 2 days provided.        Follow up plan: Return if symptoms worsen or fail to improve.

## 2013-05-12 NOTE — Patient Instructions (Signed)
I think you have acute bronchitis - treat with zpack.  May use tussionex for cough at night time. Push fluids and plenty of rest. Let me know if fever >101, worsening productive cough or not improving as expected. I hope you start feeling better.

## 2013-07-17 ENCOUNTER — Other Ambulatory Visit: Payer: Self-pay | Admitting: Family Medicine

## 2013-07-17 NOTE — Telephone Encounter (Signed)
Rx called in as directed.   

## 2013-07-17 NOTE — Telephone Encounter (Signed)
plz phone in. 

## 2013-08-02 ENCOUNTER — Other Ambulatory Visit: Payer: Self-pay | Admitting: Family Medicine

## 2013-08-06 ENCOUNTER — Other Ambulatory Visit: Payer: Self-pay | Admitting: Family Medicine

## 2013-08-22 ENCOUNTER — Other Ambulatory Visit: Payer: Self-pay | Admitting: Family Medicine

## 2013-08-22 ENCOUNTER — Other Ambulatory Visit (INDEPENDENT_AMBULATORY_CARE_PROVIDER_SITE_OTHER): Payer: BC Managed Care – PPO

## 2013-08-22 DIAGNOSIS — N289 Disorder of kidney and ureter, unspecified: Secondary | ICD-10-CM

## 2013-08-22 DIAGNOSIS — N400 Enlarged prostate without lower urinary tract symptoms: Secondary | ICD-10-CM

## 2013-08-22 DIAGNOSIS — E785 Hyperlipidemia, unspecified: Secondary | ICD-10-CM

## 2013-08-22 LAB — CBC WITH DIFFERENTIAL/PLATELET
Basophils Absolute: 0 10*3/uL (ref 0.0–0.1)
Basophils Relative: 0.4 % (ref 0.0–3.0)
EOS ABS: 0.1 10*3/uL (ref 0.0–0.7)
Eosinophils Relative: 1.2 % (ref 0.0–5.0)
HCT: 42.3 % (ref 39.0–52.0)
Hemoglobin: 14.2 g/dL (ref 13.0–17.0)
LYMPHS PCT: 27.9 % (ref 12.0–46.0)
Lymphs Abs: 2 10*3/uL (ref 0.7–4.0)
MCHC: 33.5 g/dL (ref 30.0–36.0)
MCV: 82.2 fl (ref 78.0–100.0)
MONO ABS: 0.7 10*3/uL (ref 0.1–1.0)
Monocytes Relative: 9.4 % (ref 3.0–12.0)
Neutro Abs: 4.4 10*3/uL (ref 1.4–7.7)
Neutrophils Relative %: 61.1 % (ref 43.0–77.0)
PLATELETS: 263 10*3/uL (ref 150.0–400.0)
RBC: 5.15 Mil/uL (ref 4.22–5.81)
RDW: 14.5 % (ref 11.5–15.5)
WBC: 7.2 10*3/uL (ref 4.0–10.5)

## 2013-08-22 LAB — RENAL FUNCTION PANEL
ALBUMIN: 4.6 g/dL (ref 3.5–5.2)
BUN: 19 mg/dL (ref 6–23)
CHLORIDE: 105 meq/L (ref 96–112)
CO2: 27 mEq/L (ref 19–32)
Calcium: 9.5 mg/dL (ref 8.4–10.5)
Creatinine, Ser: 1.2 mg/dL (ref 0.4–1.5)
GFR: 66.77 mL/min (ref >60.00)
GLUCOSE: 101 mg/dL — AB (ref 70–99)
PHOSPHORUS: 3.8 mg/dL (ref 2.3–4.6)
Potassium: 4.3 mEq/L (ref 3.5–5.1)
SODIUM: 140 meq/L (ref 135–145)

## 2013-08-22 LAB — LIPID PANEL
CHOL/HDL RATIO: 4
Cholesterol: 135 mg/dL (ref 0–200)
HDL: 34 mg/dL — ABNORMAL LOW (ref >39.00)
LDL CALC: 68 mg/dL (ref 0–99)
NONHDL: 101
Triglycerides: 167 mg/dL — ABNORMAL HIGH (ref 0.0–149.0)
VLDL: 33.4 mg/dL (ref 0.0–40.0)

## 2013-08-22 LAB — PSA: PSA: 0.89 ng/mL (ref 0.10–4.00)

## 2013-08-28 ENCOUNTER — Encounter: Payer: Self-pay | Admitting: Family Medicine

## 2013-08-28 ENCOUNTER — Ambulatory Visit (INDEPENDENT_AMBULATORY_CARE_PROVIDER_SITE_OTHER): Payer: BC Managed Care – PPO | Admitting: Family Medicine

## 2013-08-28 VITALS — BP 118/70 | HR 64 | Temp 97.8°F | Ht 72.0 in | Wt 217.0 lb

## 2013-08-28 DIAGNOSIS — N400 Enlarged prostate without lower urinary tract symptoms: Secondary | ICD-10-CM

## 2013-08-28 DIAGNOSIS — E785 Hyperlipidemia, unspecified: Secondary | ICD-10-CM

## 2013-08-28 DIAGNOSIS — Z0000 Encounter for general adult medical examination without abnormal findings: Secondary | ICD-10-CM | POA: Insufficient documentation

## 2013-08-28 DIAGNOSIS — Z23 Encounter for immunization: Secondary | ICD-10-CM

## 2013-08-28 MED ORDER — EZETIMIBE-SIMVASTATIN 10-80 MG PO TABS: 1.0000 | ORAL_TABLET | Freq: Every day | ORAL | Status: DC

## 2013-08-28 MED ORDER — CARVEDILOL 6.25 MG PO TABS: ORAL_TABLET | ORAL | Status: DC

## 2013-08-28 NOTE — Progress Notes (Signed)
BP 118/70  Pulse 64  Temp(Src) 97.8 F (36.6 C) (Oral)  Ht 6' (1.829 m)  Wt 217 lb (98.431 kg)  BMI 29.42 kg/m2   CC: CPE  Subjective:    Patient ID: Alec Maldonado, male    DOB: 1950/09/16, 63 y.o.   MRN: [id number]  HPI: Alec Maldonado is a 63 y.o. male presenting on 08/28/2013 for Annual Exam   Wt Readings from Last 3 Encounters:  08/28/13 217 lb (98.431 kg)  05/12/13 224 lb 12.8 oz (101.969 kg)  05/08/13 219 lb (99.338 kg)  Body mass index is 29.42 kg/(m^2).  Preventative: Colonoscopy 2008 in [place], states normal for 10 yrs Prostate screen - recheck today. Flu - did not receive Td 2004. Tdap today. zostavax - declines  Widowed, wife deceased colon cancer Jul 15, 1996, remarried 2001, lives with remarried wife Occupation: works for coffee Radiographer, therapeutic [employer] Activity: no regular exercise Diet: good water, fruits/vegetables daily  Relevant past medical, surgical, family and social history reviewed and updated as indicated.  Allergies and medications reviewed and updated. Current Outpatient Prescriptions on File Prior to Visit  Medication Sig  . aspirin 81 MG tablet Take 81 mg by mouth daily.    . butalbital-acetaminophen-caffeine (FIORICET, ESGIC) 50-325-40 MG per tablet TAKE 1 TABLET BY MOUTH EVERY 6 HOURS AS NEEDED  . finasteride (PROSCAR) 5 MG tablet TAKE 1 TABLET BY MOUTH DAILY.  Marland Kitchen gabapentin (NEURONTIN) 300 MG capsule TAKE 1 CAPSULE BY MOUTH THREE TIMES A DAY  . tamsulosin (FLOMAX) 0.4 MG CAPS capsule TAKE 1 CAPSULE BY MOUTH AT BEDTIME   No current facility-administered medications on file prior to visit.    Review of Systems  Constitutional: Negative for fever, chills, activity change, appetite change, fatigue and unexpected weight change.  HENT: Negative for hearing loss.   Eyes: Negative for visual disturbance.  Respiratory: Negative for cough, chest tightness, shortness of breath and wheezing.   Cardiovascular: Negative for chest pain, palpitations and leg  swelling.  Gastrointestinal: Negative for nausea, vomiting, abdominal pain, diarrhea, constipation, blood in stool and abdominal distention.  Genitourinary: Negative for hematuria and difficulty urinating.  Musculoskeletal: Negative for arthralgias, myalgias and neck pain.  Skin: Negative for rash.  Neurological: Negative for dizziness, seizures, syncope and headaches.  Hematological: Negative for adenopathy. Does not bruise/bleed easily.  Psychiatric/Behavioral: Negative for dysphoric mood. The patient is not nervous/anxious.    Per HPI unless specifically indicated above    Objective:    BP 118/70  Pulse 64  Temp(Src) 97.8 F (36.6 C) (Oral)  Ht 6' (1.829 m)  Wt 217 lb (98.431 kg)  BMI 29.42 kg/m2  Physical Exam  Nursing note and vitals reviewed. Constitutional: He is oriented to person, place, and time. He appears well-developed and well-nourished. No distress.  HENT:  Head: Normocephalic and atraumatic.  Right Ear: Hearing, tympanic membrane, external ear and ear canal normal.  Left Ear: Hearing, tympanic membrane, external ear and ear canal normal.  Nose: Nose normal.  Mouth/Throat: Uvula is midline, oropharynx is clear and moist and mucous membranes are normal. No oropharyngeal exudate, posterior oropharyngeal edema or posterior oropharyngeal erythema.  Eyes: Conjunctivae and EOM are normal. Pupils are equal, round, and reactive to light. No scleral icterus.  Neck: Normal range of motion. Neck supple. Carotid bruit is not present. No thyromegaly present.  Cardiovascular: Normal rate, regular rhythm, normal heart sounds and intact distal pulses.   No murmur heard. Pulses:      Radial pulses are 2+ on the right  side, and 2+ on the left side.  Pulmonary/Chest: Effort normal and breath sounds normal. No respiratory distress. He has no wheezes. He has no rales.  Abdominal: Soft. Bowel sounds are normal. He exhibits no distension and no mass. There is no tenderness. There is no  rebound and no guarding.  Genitourinary: Rectum normal and prostate normal. Rectal exam shows no external hemorrhoid, no internal hemorrhoid, no fissure, no mass, no tenderness and anal tone normal. Prostate is not enlarged (15gm) and not tender.  Musculoskeletal: Normal range of motion. He exhibits no edema.  Lymphadenopathy:    He has no cervical adenopathy.  Neurological: He is alert and oriented to person, place, and time.  CN grossly intact, station and gait intact  Skin: Skin is warm and dry. No rash noted.  Psychiatric: He has a normal mood and affect. His behavior is normal. Judgment and thought content normal.   Results for orders placed in visit on 08/22/13  LIPID PANEL      Result Value Ref Range   Cholesterol 135  0 - 200 mg/dL   Triglycerides 167.0 (*) 0.0 - 149.0 mg/dL   HDL 34.00 (*) >39.00 mg/dL   VLDL 33.4  0.0 - 40.0 mg/dL   LDL Cholesterol 68  0 - 99 mg/dL   Total CHOL/HDL Ratio 4     NonHDL 101.00    RENAL FUNCTION PANEL      Result Value Ref Range   Sodium 140  135 - 145 mEq/L   Potassium 4.3  3.5 - 5.1 mEq/L   Chloride 105  96 - 112 mEq/L   CO2 27  19 - 32 mEq/L   Calcium 9.5  8.4 - 10.5 mg/dL   Albumin 4.6  3.5 - 5.2 g/dL   BUN 19  6 - 23 mg/dL   Creatinine, Ser 1.2  0.4 - 1.5 mg/dL   Glucose, Bld 101 (*) 70 - 99 mg/dL   Phosphorus 3.8  2.3 - 4.6 mg/dL   GFR 66.77  >60.00 mL/min  PSA      Result Value Ref Range   PSA 0.89  0.10 - 4.00 ng/mL  CBC WITH DIFFERENTIAL      Result Value Ref Range   WBC 7.2  4.0 - 10.5 K/uL   RBC 5.15  4.22 - 5.81 Mil/uL   Hemoglobin 14.2  13.0 - 17.0 g/dL   HCT 42.3  39.0 - 52.0 %   MCV 82.2  78.0 - 100.0 fl   MCHC 33.5  30.0 - 36.0 g/dL   RDW 14.5  11.5 - 15.5 %   Platelets 263.0  150.0 - 400.0 K/uL   Neutrophils Relative % 61.1  43.0 - 77.0 %   Lymphocytes Relative 27.9  12.0 - 46.0 %   Monocytes Relative 9.4  3.0 - 12.0 %   Eosinophils Relative 1.2  0.0 - 5.0 %   Basophils Relative 0.4  0.0 - 3.0 %   Neutro Abs  4.4  1.4 - 7.7 K/uL   Lymphs Abs 2.0  0.7 - 4.0 K/uL   Monocytes Absolute 0.7  0.1 - 1.0 K/uL   Eosinophils Absolute 0.1  0.0 - 0.7 K/uL   Basophils Absolute 0.0  0.0 - 0.1 K/uL      Assessment & Plan:   Problem List Items Addressed This Visit   Health maintenance examination - Primary     Preventative protocols reviewed and updated unless pt declined. Discussed healthy diet and lifestyle.    DYSLIPIDEMIA  Reviewed FLP with patient. Mildly uncontrolled. Continue vytorin.    Relevant Medications      carvedilol (COREG) tablet      ezetimibe-simvastatin (VYTORIN) 10-80 MG per tablet   BPH (benign prostatic hypertrophy)     Stable PSA/DRE.        Follow up plan: Return in about 1 year (around 08/29/2014), or as needed, for annual exam, prior fasting for blood work.

## 2013-08-28 NOTE — Assessment & Plan Note (Signed)
Reviewed FLP with patient. Mildly uncontrolled. Continue vytorin.

## 2013-08-28 NOTE — Patient Instructions (Addendum)
Tdap today. Return at your convenience for removal of spot on back. Good to see you today, call us with questions. Return as needed or in 1 year for next physical.

## 2013-08-28 NOTE — Progress Notes (Signed)
Pre visit review using our clinic review tool, if applicable. No additional management support is needed unless otherwise documented below in the visit note. 

## 2013-08-28 NOTE — Assessment & Plan Note (Signed)
Preventative protocols reviewed and updated unless pt declined. Discussed healthy diet and lifestyle.  

## 2013-08-28 NOTE — Assessment & Plan Note (Signed)
Stable PSA/DRE 

## 2013-08-28 NOTE — Addendum Note (Signed)
Addended by: Royann Shivers A on: 08/28/2013 05:42 PM   Modules accepted: Orders

## 2013-08-29 ENCOUNTER — Encounter: Payer: BC Managed Care – PPO | Admitting: Family Medicine

## 2013-09-07 ENCOUNTER — Other Ambulatory Visit: Payer: Self-pay | Admitting: Family Medicine

## 2013-09-21 ENCOUNTER — Other Ambulatory Visit: Payer: Self-pay | Admitting: Family Medicine

## 2014-01-14 ENCOUNTER — Encounter: Payer: Self-pay | Admitting: Family Medicine

## 2014-01-14 ENCOUNTER — Ambulatory Visit (INDEPENDENT_AMBULATORY_CARE_PROVIDER_SITE_OTHER): Payer: BC Managed Care – PPO | Admitting: Family Medicine

## 2014-01-14 VITALS — BP 112/70 | HR 72 | Temp 97.2°F | Wt 215.5 lb

## 2014-01-14 DIAGNOSIS — L820 Inflamed seborrheic keratosis: Secondary | ICD-10-CM

## 2014-01-14 DIAGNOSIS — R21 Rash and other nonspecific skin eruption: Secondary | ICD-10-CM

## 2014-01-14 MED ORDER — FINASTERIDE 5 MG PO TABS: ORAL_TABLET | ORAL | Status: DC

## 2014-01-14 MED ORDER — CARVEDILOL 6.25 MG PO TABS: ORAL_TABLET | ORAL | Status: DC

## 2014-01-14 MED ORDER — GABAPENTIN 300 MG PO CAPS: ORAL_CAPSULE | ORAL | Status: DC

## 2014-01-14 MED ORDER — BUTALBITAL-APAP-CAFFEINE 50-325-40 MG PO TABS: ORAL_TABLET | ORAL | Status: DC

## 2014-01-14 MED ORDER — EZETIMIBE-SIMVASTATIN 10-80 MG PO TABS: ORAL_TABLET | ORAL | Status: DC

## 2014-01-14 MED ORDER — TAMSULOSIN HCL 0.4 MG PO CAPS: ORAL_CAPSULE | ORAL | Status: DC

## 2014-01-14 NOTE — Progress Notes (Signed)
   BP 112/70 mmHg  Pulse 72  Temp(Src) 97.2 F (36.2 C) (Oral)  Wt 215 lb 8 oz (97.75 kg)   CC: check skin  Subjective:    Patient ID: Alec Maldonado, male    DOB: Apr 07, 1950, 63 y.o.   MRN: [id number]  HPI: Alec Maldonado is a 63 y.o. male presenting on 01/14/2014 for Check place on arm and Medication Refill   Spot L forearm showed up 1 wk ago, denies inciting injury. Small scab like area with surrounding erythema. On aspirin daily. Denies tick bites recently.  No fevers/chills, joint pain, other rash, abd pain, headache.  Relevant past medical, surgical, family and social history reviewed and updated as indicated.  Allergies and medications reviewed and updated. Current Outpatient Prescriptions on File Prior to Visit  Medication Sig  . aspirin 81 MG tablet Take 81 mg by mouth daily.     No current facility-administered medications on file prior to visit.    Review of Systems Per HPI unless specifically indicated above    Objective:    BP 112/70 mmHg  Pulse 72  Temp(Src) 97.2 F (36.2 C) (Oral)  Wt 215 lb 8 oz (97.75 kg)  Physical Exam  Constitutional: He appears well-developed and well-nourished. No distress.  Skin: Skin is warm and dry. Rash noted.  What seems like punctate scab L forearm with faint surrounding ecchymosis L upper back with benign scaly growth with stuck on appearance  Vitals reviewed.  Shave biopsy IC obtained and in chart. Indication: inflamed SK Location: L upper back Size: 50mm lesion Prep: EtOH pad Anesthesia: 1%lidocaine with epi, good effect Shave made with dermablade Minimal oozing, controlled with silver nitrate Wound dressed with topical abx ointment and bandaid. Tolerated well Routine postprocedure instructions d/w pt- keep area clean and bandaged, follow up if concerns/spreading erythema/pain.    Assessment & Plan:   Problem List Items Addressed This Visit    Skin rash    Some concern for foreign body - removed with thin-nosed  forceps and evaluated under microscope - consistent with fibrous tissue.    Seborrheic keratosis, inflamed - Primary    Irritated SK, removed. Pt tolerated well.        Follow up plan: No Follow-up on file.

## 2014-01-14 NOTE — Patient Instructions (Signed)
Good to see you today, call us with questions. Treat wound with antibiotic ointment daily. Return if any spreading redness or pus or wound fails to heal

## 2014-01-14 NOTE — Assessment & Plan Note (Signed)
Irritated SK, removed. Pt tolerated well.

## 2014-01-14 NOTE — Progress Notes (Signed)
Pre visit review using our clinic review tool, if applicable. No additional management support is needed unless otherwise documented below in the visit note. 

## 2014-01-14 NOTE — Assessment & Plan Note (Signed)
Some concern for foreign body - removed with thin-nosed forceps and evaluated under microscope - consistent with fibrous tissue.

## 2014-02-28 ENCOUNTER — Other Ambulatory Visit: Payer: Self-pay | Admitting: Family Medicine

## 2014-03-01 NOTE — Telephone Encounter (Signed)
plz phone in. 

## 2014-03-02 NOTE — Telephone Encounter (Signed)
Rx called in as directed.   

## 2014-03-11 ENCOUNTER — Other Ambulatory Visit: Payer: Self-pay

## 2014-03-11 NOTE — Telephone Encounter (Signed)
Pt has the other rx that was printed on 01/14/14 but pt cannot find the vytorin and flomax rx and pt has changed ins co and pt request new printed rx done and call pt when ready for pick up.

## 2014-03-13 MED ORDER — EZETIMIBE-SIMVASTATIN 10-80 MG PO TABS: ORAL_TABLET | ORAL | Status: DC

## 2014-03-13 MED ORDER — TAMSULOSIN HCL 0.4 MG PO CAPS: ORAL_CAPSULE | ORAL | Status: DC

## 2014-03-13 NOTE — Telephone Encounter (Signed)
Message left notifying patient and Rx placed up front for pick up. 

## 2014-03-13 NOTE — Telephone Encounter (Signed)
Printed and in kims'box  

## 2014-03-19 ENCOUNTER — Telehealth: Payer: Self-pay | Admitting: *Deleted

## 2014-03-19 NOTE — Telephone Encounter (Signed)
Lm on pts vm requesting a call back if wanting to schedule a flu shot 

## 2014-10-14 ENCOUNTER — Other Ambulatory Visit: Payer: Self-pay | Admitting: Family Medicine

## 2014-10-15 MED ORDER — BUTALBITAL-APAP-CAFFEINE 50-325-40 MG PO TABS: 1.0000 | ORAL_TABLET | Freq: Four times a day (QID) | ORAL | Status: DC | PRN

## 2014-10-15 NOTE — Telephone Encounter (Signed)
Needs written script faxed. Printed and in your IN box.

## 2014-10-15 NOTE — Telephone Encounter (Signed)
Printed and in Kim's box 

## 2014-10-15 NOTE — Telephone Encounter (Signed)
plz phone in. 

## 2014-10-16 NOTE — Telephone Encounter (Signed)
Rx faxed to [facility].

## 2015-02-04 ENCOUNTER — Encounter (HOSPITAL_COMMUNITY)
Admission: EM | Disposition: A | Discharge status: Home / Self Care | Payer: Self-pay | Source: Home / Self Care | Attending: Emergency Medicine

## 2015-02-04 ENCOUNTER — Encounter (HOSPITAL_COMMUNITY): Payer: Self-pay | Admitting: Emergency Medicine

## 2015-02-04 ENCOUNTER — Emergency Department (HOSPITAL_COMMUNITY): Payer: Medicare Other

## 2015-02-04 ENCOUNTER — Ambulatory Visit (HOSPITAL_COMMUNITY)
Admission: EM | Admit: 2015-02-04 | Discharge: 2015-02-04 | Disposition: A | Discharge status: Home / Self Care | Payer: Medicare Other | Attending: Emergency Medicine | Admitting: Emergency Medicine

## 2015-02-04 ENCOUNTER — Emergency Department (HOSPITAL_COMMUNITY): Payer: Medicare Other | Admitting: Anesthesiology

## 2015-02-04 DIAGNOSIS — W298XXA Contact with other powered powered hand tools and household machinery, initial encounter: Secondary | ICD-10-CM | POA: Insufficient documentation

## 2015-02-04 DIAGNOSIS — S62630B Displaced fracture of distal phalanx of right index finger, initial encounter for open fracture: Secondary | ICD-10-CM | POA: Insufficient documentation

## 2015-02-04 DIAGNOSIS — S64490A Injury of digital nerve of right index finger, initial encounter: Secondary | ICD-10-CM | POA: Diagnosis not present

## 2015-02-04 DIAGNOSIS — S65510A Laceration of blood vessel of right index finger, initial encounter: Secondary | ICD-10-CM | POA: Diagnosis not present

## 2015-02-04 DIAGNOSIS — E785 Hyperlipidemia, unspecified: Secondary | ICD-10-CM | POA: Insufficient documentation

## 2015-02-04 DIAGNOSIS — S62639B Displaced fracture of distal phalanx of unspecified finger, initial encounter for open fracture: Secondary | ICD-10-CM

## 2015-02-04 DIAGNOSIS — Z23 Encounter for immunization: Secondary | ICD-10-CM | POA: Diagnosis not present

## 2015-02-04 DIAGNOSIS — S61210A Laceration without foreign body of right index finger without damage to nail, initial encounter: Secondary | ICD-10-CM | POA: Diagnosis present

## 2015-02-04 HISTORY — PX: I&D EXTREMITY: SHX5045

## 2015-02-04 SURGERY — IRRIGATION AND DEBRIDEMENT EXTREMITY
Anesthesia: General | Site: Finger | Laterality: Right

## 2015-02-04 MED ORDER — MIDAZOLAM HCL 2 MG/2ML IJ SOLN
INTRAMUSCULAR | Status: AC
Start: 2015-02-04 — End: 2015-02-04
  Filled 2015-02-04: qty 2

## 2015-02-04 MED ORDER — BUPIVACAINE HCL (PF) 0.25 % IJ SOLN
INTRAMUSCULAR | Status: DC | PRN
  Administered 2015-02-04: 7 mL

## 2015-02-04 MED ORDER — FENTANYL CITRATE (PF) 250 MCG/5ML IJ SOLN
INTRAMUSCULAR | Status: AC
  Filled 2015-02-04: qty 5

## 2015-02-04 MED ORDER — SODIUM CHLORIDE 0.9 % IR SOLN
Status: DC | PRN
  Administered 2015-02-04 (×2): 1000 mL

## 2015-02-04 MED ORDER — FENTANYL CITRATE (PF) 100 MCG/2ML IJ SOLN
INTRAMUSCULAR | Status: DC | PRN
  Administered 2015-02-04: 100 ug via INTRAVENOUS

## 2015-02-04 MED ORDER — MORPHINE SULFATE (PF) 4 MG/ML IV SOLN
4.0000 mg | Freq: Once | INTRAVENOUS | Status: AC
  Administered 2015-02-04: 4 mg via INTRAVENOUS
  Filled 2015-02-04: qty 1

## 2015-02-04 MED ORDER — ROCURONIUM BROMIDE 50 MG/5ML IV SOLN
INTRAVENOUS | Status: AC
  Filled 2015-02-04: qty 1

## 2015-02-04 MED ORDER — EPHEDRINE SULFATE 50 MG/ML IJ SOLN
INTRAMUSCULAR | Status: DC | PRN
  Administered 2015-02-04: 10 mg via INTRAVENOUS

## 2015-02-04 MED ORDER — ONDANSETRON HCL 4 MG/2ML IJ SOLN
INTRAMUSCULAR | Status: AC
  Filled 2015-02-04: qty 2

## 2015-02-04 MED ORDER — HYDROMORPHONE HCL 1 MG/ML IJ SOLN: 0.2500 mg | INTRAMUSCULAR | Status: DC | PRN

## 2015-02-04 MED ORDER — SUCCINYLCHOLINE CHLORIDE 20 MG/ML IJ SOLN
INTRAMUSCULAR | Status: AC
  Filled 2015-02-04: qty 1

## 2015-02-04 MED ORDER — ASPIRIN EC 325 MG PO TBEC: 325.0000 mg | DELAYED_RELEASE_TABLET | Freq: Every day | ORAL | Status: DC

## 2015-02-04 MED ORDER — LACTATED RINGERS IV SOLN
INTRAVENOUS | Status: DC
  Administered 2015-02-04: 19:00:00 via INTRAVENOUS

## 2015-02-04 MED ORDER — LIDOCAINE HCL (CARDIAC) 20 MG/ML IV SOLN
INTRAVENOUS | Status: AC
  Filled 2015-02-04: qty 5

## 2015-02-04 MED ORDER — CEFAZOLIN (ANCEF) 1 G IV SOLR: 1.0000 g | INTRAVENOUS | Status: DC

## 2015-02-04 MED ORDER — CEFAZOLIN SODIUM 1-5 GM-% IV SOLN
INTRAVENOUS | Status: DC | PRN
  Administered 2015-02-04: 1 g via INTRAVENOUS

## 2015-02-04 MED ORDER — PROPOFOL 10 MG/ML IV BOLUS
INTRAVENOUS | Status: AC
  Filled 2015-02-04: qty 20

## 2015-02-04 MED ORDER — LACTATED RINGERS IV SOLN
INTRAVENOUS | Status: DC | PRN
  Administered 2015-02-04 (×2): via INTRAVENOUS

## 2015-02-04 MED ORDER — CEFAZOLIN SODIUM 1-5 GM-% IV SOLN
1.0000 g | Freq: Once | INTRAVENOUS | Status: AC
  Administered 2015-02-04: 1 g via INTRAVENOUS
  Filled 2015-02-04: qty 50

## 2015-02-04 MED ORDER — OXYCODONE HCL 5 MG PO TABS: 5.0000 mg | ORAL_TABLET | Freq: Once | ORAL | Status: DC | PRN

## 2015-02-04 MED ORDER — PROPOFOL 10 MG/ML IV BOLUS
INTRAVENOUS | Status: DC | PRN
  Administered 2015-02-04: 200 mg via INTRAVENOUS

## 2015-02-04 MED ORDER — ONDANSETRON HCL 4 MG/2ML IJ SOLN
INTRAMUSCULAR | Status: DC | PRN
Start: 2015-02-04 — End: 2015-02-04
  Administered 2015-02-04: 4 mg via INTRAVENOUS

## 2015-02-04 MED ORDER — HYDROCODONE-ACETAMINOPHEN 5-325 MG PO TABS: ORAL_TABLET | ORAL | Status: DC

## 2015-02-04 MED ORDER — OXYCODONE HCL 5 MG/5ML PO SOLN: 5.0000 mg | Freq: Once | ORAL | Status: DC | PRN

## 2015-02-04 MED ORDER — SUCCINYLCHOLINE CHLORIDE 20 MG/ML IJ SOLN
INTRAMUSCULAR | Status: DC | PRN
  Administered 2015-02-04: 120 mg via INTRAVENOUS

## 2015-02-04 MED ORDER — TETANUS-DIPHTH-ACELL PERTUSSIS 5-2.5-18.5 LF-MCG/0.5 IM SUSP
0.5000 mL | Freq: Once | INTRAMUSCULAR | Status: AC
  Administered 2015-02-04: 0.5 mL via INTRAMUSCULAR
  Filled 2015-02-04: qty 0.5

## 2015-02-04 MED ORDER — MEPERIDINE HCL 25 MG/ML IJ SOLN: 6.2500 mg | INTRAMUSCULAR | Status: DC | PRN

## 2015-02-04 MED ORDER — BUPIVACAINE HCL (PF) 0.25 % IJ SOLN
INTRAMUSCULAR | Status: AC
  Filled 2015-02-04: qty 30

## 2015-02-04 MED ORDER — SULFAMETHOXAZOLE-TRIMETHOPRIM 800-160 MG PO TABS: 1.0000 | ORAL_TABLET | Freq: Two times a day (BID) | ORAL | Status: DC

## 2015-02-04 MED ORDER — LIDOCAINE HCL (CARDIAC) 20 MG/ML IV SOLN
INTRAVENOUS | Status: DC | PRN
  Administered 2015-02-04: 60 mg via INTRAVENOUS

## 2015-02-04 SURGICAL SUPPLY — 65 items
BANDAGE COBAN STERILE 2 (GAUZE/BANDAGES/DRESSINGS) IMPLANT
BANDAGE ELASTIC 3 VELCRO ST LF (GAUZE/BANDAGES/DRESSINGS) ×3 IMPLANT
BANDAGE ELASTIC 4 VELCRO ST LF (GAUZE/BANDAGES/DRESSINGS) ×3 IMPLANT
BNDG COHESIVE 1X5 TAN STRL LF (GAUZE/BANDAGES/DRESSINGS) IMPLANT
BNDG CONFORM 2 STRL LF (GAUZE/BANDAGES/DRESSINGS) IMPLANT
BNDG ESMARK 4X9 LF (GAUZE/BANDAGES/DRESSINGS) IMPLANT
BNDG GAUZE ELAST 4 BULKY (GAUZE/BANDAGES/DRESSINGS) ×3 IMPLANT
CORDS BIPOLAR (ELECTRODE) ×3 IMPLANT
COVER SURGICAL LIGHT HANDLE (MISCELLANEOUS) ×3 IMPLANT
DECANTER SPIKE VIAL GLASS SM (MISCELLANEOUS) ×3 IMPLANT
DRAIN PENROSE 1/4X12 LTX STRL (WOUND CARE) IMPLANT
DRAPE MICROSCOPE LEICA (MISCELLANEOUS) ×3 IMPLANT
DRAPE OEC MINIVIEW 54X84 (DRAPES) IMPLANT
DRSG ADAPTIC 3X8 NADH LF (GAUZE/BANDAGES/DRESSINGS) IMPLANT
DRSG EMULSION OIL 3X3 NADH (GAUZE/BANDAGES/DRESSINGS) IMPLANT
DRSG PAD ABDOMINAL 8X10 ST (GAUZE/BANDAGES/DRESSINGS) IMPLANT
GAUZE SPONGE 4X4 12PLY STRL (GAUZE/BANDAGES/DRESSINGS) ×3 IMPLANT
GAUZE XEROFORM 1X8 LF (GAUZE/BANDAGES/DRESSINGS) ×3 IMPLANT
GLOVE BIO SURGEON STRL SZ7.5 (GLOVE) ×3 IMPLANT
GLOVE BIOGEL PI IND STRL 8 (GLOVE) ×1 IMPLANT
GLOVE BIOGEL PI INDICATOR 8 (GLOVE) ×2
GOWN STRL REUS TWL 2XL XL LVL4 (GOWN DISPOSABLE) ×3 IMPLANT
GOWN STRL REUS W/ TWL LRG LVL3 (GOWN DISPOSABLE) ×1 IMPLANT
GOWN STRL REUS W/TWL 2XL LVL3 (GOWN DISPOSABLE) ×3 IMPLANT
GOWN STRL REUS W/TWL LRG LVL3 (GOWN DISPOSABLE) ×2
KIT BASIN OR (CUSTOM PROCEDURE TRAY) ×3 IMPLANT
KIT ROOM TURNOVER OR (KITS) ×3 IMPLANT
LOOP VESSEL MAXI BLUE (MISCELLANEOUS) IMPLANT
LOOP VESSEL MINI RED (MISCELLANEOUS) IMPLANT
MANIFOLD NEPTUNE II (INSTRUMENTS) ×3 IMPLANT
NEEDLE HYPO 25X1 1.5 SAFETY (NEEDLE) ×3 IMPLANT
NS IRRIG 1000ML POUR BTL (IV SOLUTION) ×6 IMPLANT
PACK ORTHO EXTREMITY (CUSTOM PROCEDURE TRAY) ×3 IMPLANT
PAD ARMBOARD 7.5X6 YLW CONV (MISCELLANEOUS) ×6 IMPLANT
PAD CAST 3X4 CTTN HI CHSV (CAST SUPPLIES) ×1 IMPLANT
PAD CAST 4YDX4 CTTN HI CHSV (CAST SUPPLIES) ×1 IMPLANT
PADDING CAST COTTON 3X4 STRL (CAST SUPPLIES) ×2
PADDING CAST COTTON 4X4 STRL (CAST SUPPLIES) ×2
SCRUB BETADINE 4OZ XXX (MISCELLANEOUS) ×3 IMPLANT
SET CYSTO W/LG BORE CLAMP LF (SET/KITS/TRAYS/PACK) ×3 IMPLANT
SOLUTION BETADINE 4OZ (MISCELLANEOUS) ×3 IMPLANT
SPEAR EYE SURG WECK-CEL (MISCELLANEOUS) ×3 IMPLANT
SPLINT PLASTER CAST XFAST 3X15 (CAST SUPPLIES) ×1 IMPLANT
SPLINT PLASTER XTRA FASTSET 3X (CAST SUPPLIES) ×2
SPONGE LAP 18X18 X RAY DECT (DISPOSABLE) IMPLANT
SPONGE LAP 4X18 X RAY DECT (DISPOSABLE) IMPLANT
SUCTION FRAZIER TIP 10 FR DISP (SUCTIONS) ×3 IMPLANT
SUT CHROMIC 6 0 PS 4 (SUTURE) ×3 IMPLANT
SUT ETHILON 4 0 P 3 18 (SUTURE) IMPLANT
SUT ETHILON 4 0 PS 2 18 (SUTURE) ×3 IMPLANT
SUT ETHILON 9 0 BV100 4 (SUTURE) ×3 IMPLANT
SUT MERSILENE 4 0 P 3 (SUTURE) IMPLANT
SUT MON AB 5-0 P3 18 (SUTURE) ×3 IMPLANT
SUT PROLENE 3 0 PS 2 (SUTURE) IMPLANT
SUT SILK 2 0 SH (SUTURE) ×3 IMPLANT
SYR CONTROL 10ML LL (SYRINGE) ×3 IMPLANT
TOWEL OR 17X24 6PK STRL BLUE (TOWEL DISPOSABLE) ×6 IMPLANT
TOWEL OR 17X26 10 PK STRL BLUE (TOWEL DISPOSABLE) ×3 IMPLANT
TUBE ANAEROBIC SPECIMEN COL (MISCELLANEOUS) ×3 IMPLANT
TUBE CONNECTING 12'X1/4 (SUCTIONS) ×1
TUBE CONNECTING 12X1/4 (SUCTIONS) ×2 IMPLANT
TUBE FEEDING 5FR 15 INCH (TUBING) IMPLANT
UNDERPAD 30X30 INCONTINENT (UNDERPADS AND DIAPERS) ×3 IMPLANT
WATER STERILE IRR 1000ML POUR (IV SOLUTION) ×3 IMPLANT
YANKAUER SUCT BULB TIP NO VENT (SUCTIONS) IMPLANT

## 2015-02-04 NOTE — H&P (Signed)
Alec Maldonado is an 64 y.o. male.   Chief Complaint: right index saw injury HPI: 64 yo rhd male present with wife states he injured right index finger on table saw blade today.  Seen at [facility] ED where XR revealed distal phalanx fracture.  Wound involves nail.  He reports no previous injury to finger and no other injury at this time.  Past Medical History  Diagnosis Date  . Dyslipidemia   . Mitral regurgitation     Mitral valve repair 2001  . BPH (benign prostatic hypertrophy)   . Chest discomfort     nuclear 06/24/09, suggestion prior small inferior MI with moderate peri-infarvt ischemia versus variable diaphragmatic attenuation  . Palpitations 07/2009     helped with carvedilol   . Postural dizziness     mild, no orthostatic changes by blood pressure check  . Shortness of breath   . Colon polyps     colonoscopy 07/02/06, repeat in 10 years  . Migraines   . Pleurisy   . Status post mitral valve repair     2001,  //  echo, October, 2010, good function of the repaired mitral valve  . Ejection fraction     EF 55%, echo, October, 2010  //   EF 45-50%,  catheterization, 2011  . Pre-syncope     January, 2014  . RBBB     First seen January, 2014  . Aortic insufficiency     Mild, echo, January, 2014  . Right ventricular dysfunction     Mild/moderate right ventricular dysfunction by echo, January, 2014, no tricuspid regurgitation, therefore right heart pressure could not be estimated.    Past Surgical History  Procedure Laterality Date  . Thyroid surgery    . Mitral valve repair  01/22/00    good mitral valve function- echo 12/2008- EF 55% / nuclear 06/2009- EF 36%,? reliable? / EF 45-50%, cath rated 02/2010  . Aneurysmal dilatation      proximal LAD cath 2001, reoeat catheter 2004, no significant change  . Aneurysmal dilatiation  07/15/09    no change, normal right heart filling pressures and LVEDP- EF 45-50%  . Appendectomy  1970  . Thyroidectomy      left, due to goiter 1994  . Orif  elbow fracture      left  . Coronary angioplasty  09/1999    severe MR o/w normal limits  . Coronary angioplasty  12/19/02    pos. CAD, mild LV dysfunction- med treatment  . Hernia repair  01/11/05    left, Dr. Hassell Done  . Cardiac catheterization  2010    mild global LV dysfunction EF 45-50%, no significant CAD obstruction, aneurysmal dilatation LAD  . Colonoscopy  2008    Family History  Problem Relation Age of Onset  . Alzheimer's disease Mother   . Heart disease Mother     CHF  . Arthritis Father   . Heart disease Father     leaking mitral valve  . Benign prostatic hyperplasia Father   . Heart disease Brother     murmur  . Hypertension Neg Hx   . Diabetes Neg Hx   . Depression Neg Hx   . Alcohol abuse Neg Hx   . Drug abuse Neg Hx   . Stroke Neg Hx   . Cancer Neg Hx    Social History:  reports that he has never smoked. He has never used smokeless tobacco. He reports that he does not drink alcohol or use illicit drugs.  Allergies: No Known Allergies   (Not in a hospital admission)  No results found for this or any previous visit (from the past 48 hour(s)).  Dg Finger Index Right  02/04/2015  CLINICAL DATA:  Table saw finger injury today EXAM: RIGHT INDEX FINGER 2+V COMPARISON:  None. FINDINGS: Three views of the right second finger submitted. There is avulsed fracture at the tip of distal phalanx with multiple comminuted bony fragments consistent with provided history. There is soft tissue injury and irregularity at the tip of the finger adjacent to the fracture. Open fracture is highly suspected. IMPRESSION: There is avulsed fracture at the tip of distal phalanx with multiple comminuted bony fragments consistent with provided history. There is soft tissue injury and irregularity at the tip of the finger adjacent to the fracture. Open fracture is highly suspected. Electronically Signed   By: Lahoma Crocker M.D.   On: 02/04/2015 16:04     A comprehensive review of systems was  negative.  Blood pressure 127/79, pulse 67, temperature 97.7 F (36.5 C), temperature source Oral, resp. rate 16, height 6' (1.829 m), weight 102.059 kg (225 lb), SpO2 97 %.  General appearance: alert, cooperative and appears stated age Head: Normocephalic, without obvious abnormality, atraumatic Neck: supple, symmetrical, trachea midline Resp: clear to auscultation bilaterally Cardio: regular rate and rhythm GI: non tender Extremities: intact sensation and capillary refill all digits.  +epl/fpl/io.  right index with wound to ulnar side and tip of finger involving nail.  additional wound on ulnar side of middle phalanx.  intact sensation on both radial and ulnar sides of finger. Pulses: 2+ and symmetric Skin: Skin color, texture, turgor normal. No rashes or lesions Neurologic: Grossly normal Incision/Wound: As above  Assessment/Plan Right index finger table saw injury.  Recommend OR for irrigation and debridement of open fracture and repair of skin and nail bed.  Risks, benefits, and alternatives of surgery were discussed and the patient agrees with the plan of care.   Kassie Keng R 02/04/2015, 7:06 PM

## 2015-02-04 NOTE — ED Notes (Signed)
Pt transported to OR at this time with RN

## 2015-02-04 NOTE — Transfer of Care (Signed)
Immediate Anesthesia Transfer of Care Note  Patient: Alec Maldonado  Procedure(s) Performed: Procedure(s): IRRIGATION AND DEBRIDEMENT RIGHT INDEX FINGER OPEN FRACTURE AND REPAIR DIGITAL NERVE AND ARTERY (Right)  Patient Location: PACU  Anesthesia Type:General  Level of Consciousness: awake, oriented and patient cooperative  Airway & Oxygen Therapy: Patient Spontanous Breathing and Patient connected to nasal cannula oxygen  Post-op Assessment: Report given to RN and Post -op Vital signs reviewed and stable  Post vital signs: Reviewed and stable  Last Vitals:  Filed Vitals:   02/04/15 1931 02/04/15 1944  BP: 130/78 138/85  Pulse: 64 70  Temp:  36.6 C  Resp: 14 15    Complications: No apparent anesthesia complications

## 2015-02-04 NOTE — Op Note (Signed)
[id number] 

## 2015-02-04 NOTE — Discharge Instructions (Signed)

## 2015-02-04 NOTE — Brief Op Note (Signed)
02/04/2015  10:22 PM  PATIENT:  Alec Maldonado  64 y.o. male  PRE-OPERATIVE DIAGNOSIS:  open right finger fracture  POST-OPERATIVE DIAGNOSIS:  open right finger fracture  PROCEDURE:  Procedure(s): IRRIGATION AND DEBRIDEMENT RIGHT INDEX FINGER OPEN FRACTURE AND REPAIR DIGITAL NERVE AND ARTERY (Right)  SURGEON:  Surgeon(s) and Role:    * Leanora Cover, MD - Primary  PHYSICIAN ASSISTANT:   ASSISTANTS: none   ANESTHESIA:   general  EBL:  Total I/O In: 1150 [I.V.:1150] Out: 2 [Blood:2]  BLOOD ADMINISTERED:none  DRAINS: none   LOCAL MEDICATIONS USED:  MARCAINE     SPECIMEN:  No Specimen  DISPOSITION OF SPECIMEN:  N/A  COUNTS:  YES  TOURNIQUET:   Total Tourniquet Time Documented: Upper Arm (Right) - 111 minutes Total: Upper Arm (Right) - 111 minutes   DICTATION: .Other Dictation: Dictation Number [id number]  PLAN OF CARE: Discharge to home after PACU  PATIENT DISPOSITION:  PACU - hemodynamically stable.

## 2015-02-04 NOTE — ED Provider Notes (Signed)
CSN: [id number]     Arrival date & time 02/04/15  1455 History   First MD Initiated Contact with Patient 02/04/15 1510     Chief Complaint  Patient presents with  . Extremity Laceration     (Consider location/radiation/quality/duration/timing/severity/associated sxs/prior Treatment) HPI The patient presents to the ER with a laceration to the R pointer finger secondary to a table-saw injury around 2pm.  Patient reports "significant" blood loss but unable to quantify.  Rates pain as 4-5/10.  Denies lightheadedness, nausea, or syncope surrounding the injury. Takes 81mg  ASA QD; does not take any blood thinners.  Unsure if he is UTD on tetanus vaccine.  Past Medical History  Diagnosis Date  . Dyslipidemia   . Mitral regurgitation     Mitral valve repair 2001  . BPH (benign prostatic hypertrophy)   . Chest discomfort     nuclear 06/24/09, suggestion prior small inferior MI with moderate peri-infarvt ischemia versus variable diaphragmatic attenuation  . Palpitations 07/2009     helped with carvedilol   . Postural dizziness     mild, no orthostatic changes by blood pressure check  . Shortness of breath   . Colon polyps     colonoscopy 07/02/06, repeat in 10 years  . Migraines   . Pleurisy   . Status post mitral valve repair     2001,  //  echo, October, 2010, good function of the repaired mitral valve  . Ejection fraction     EF 55%, echo, October, 2010  //   EF 45-50%,  catheterization, 2011  . Pre-syncope     January, 2014  . RBBB     First seen January, 2014  . Aortic insufficiency     Mild, echo, January, 2014  . Right ventricular dysfunction     Mild/moderate right ventricular dysfunction by echo, January, 2014, no tricuspid regurgitation, therefore right heart pressure could not be estimated.   Past Surgical History  Procedure Laterality Date  . Thyroid surgery    . Mitral valve repair  01/22/00    good mitral valve function- echo 12/2008- EF 55% / nuclear 06/2009- EF 36%,?  reliable? / EF 45-50%, cath rated 02/2010  . Aneurysmal dilatation      proximal LAD cath 2001, reoeat catheter 2004, no significant change  . Aneurysmal dilatiation  07/15/09    no change, normal right heart filling pressures and LVEDP- EF 45-50%  . Appendectomy  1970  . Thyroidectomy      left, due to goiter 1994  . Orif elbow fracture      left  . Coronary angioplasty  09/1999    severe MR o/w normal limits  . Coronary angioplasty  12/19/02    pos. CAD, mild LV dysfunction- med treatment  . Hernia repair  01/11/05    left, Dr. Hassell Done  . Cardiac catheterization  2010    mild global LV dysfunction EF 45-50%, no significant CAD obstruction, aneurysmal dilatation LAD  . Colonoscopy  2008   Family History  Problem Relation Age of Onset  . Alzheimer's disease Mother   . Heart disease Mother     CHF  . Arthritis Father   . Heart disease Father     leaking mitral valve  . Benign prostatic hyperplasia Father   . Heart disease Brother     murmur  . Hypertension Neg Hx   . Diabetes Neg Hx   . Depression Neg Hx   . Alcohol abuse Neg Hx   . Drug abuse Neg  Hx   . Stroke Neg Hx   . Cancer Neg Hx    Social History  Substance Use Topics  . Smoking status: Never Smoker   . Smokeless tobacco: Never Used  . Alcohol Use: No    Review of Systems All other systems negative except as documented in the HPI. All pertinent positives and negatives as reviewed in the HPI.    Allergies  Review of patient's allergies indicates no known allergies.  Home Medications   Prior to Admission medications   Medication Sig Start Date End Date Taking? Authorizing Provider  aspirin 81 MG tablet Take 81 mg by mouth daily.      Historical Provider, MD  butalbital-acetaminophen-caffeine (FIORICET, ESGIC) 50-325-40 MG per tablet Take 1 tablet by mouth every 6 (six) hours as needed. 10/15/14   Ria Bush, MD  carvedilol (COREG) 6.25 MG tablet Take 1 tablet twice a day with a meal 01/14/14   Ria Bush, MD  ezetimibe-simvastatin (VYTORIN) 10-80 MG per tablet TAKE 1 TABLET BY MOUTH AT BEDTIME. 03/13/14   Ria Bush, MD  finasteride (PROSCAR) 5 MG tablet TAKE 1 TABLET BY MOUTH DAILY. 01/14/14   Ria Bush, MD  gabapentin (NEURONTIN) 300 MG capsule TAKE 1 CAPSULE BY MOUTH THREE TIMES A DAY 01/14/14   Ria Bush, MD  Omega-3 Fatty Acids (FISH OIL) 1200 MG CAPS Take 1,200 mg by mouth daily.    Historical Provider, MD  tamsulosin (FLOMAX) 0.4 MG CAPS capsule TAKE 1 CAPSULE BY MOUTH AT BEDTIME 03/13/14   Ria Bush, MD   BP 134/84 mmHg  Pulse 71  Temp(Src) 97.7 F (36.5 C) (Oral)  Resp 16  Ht 6' (1.829 m)  Wt 102.059 kg  BMI 30.51 kg/m2  SpO2 97%   Physical Exam  Constitutional: He is oriented to person, place, and time. He appears well-developed and well-nourished. No distress.  HENT:  Head: Normocephalic and atraumatic.  Eyes: Pupils are equal, round, and reactive to light.  Neck: Normal range of motion. Neck supple.  Cardiovascular: Normal rate, regular rhythm and normal heart sounds.  Exam reveals no gallop and no friction rub.   No murmur heard. Pulmonary/Chest: Effort normal and breath sounds normal. No respiratory distress. He has no wheezes.  Abdominal: There is no tenderness.  Neurological: He is alert and oriented to person, place, and time. No sensory deficit.  Skin: Skin is warm and dry. Laceration noted. No rash noted. No erythema. No pallor.  Laceration of the R index finger with soft tissue injury and near-avulsion of the pad of distal tip of his finger  Psychiatric: He has a normal mood and affect. His behavior is normal.  Nursing note and vitals reviewed.   ED Course  Procedures (including critical care time) Labs Review Labs Reviewed - No data to display  Imaging Review Dg Finger Index Right  02/04/2015  CLINICAL DATA:  Table saw finger injury today EXAM: RIGHT INDEX FINGER 2+V COMPARISON:  None. FINDINGS: Three views of the right  second finger submitted. There is avulsed fracture at the tip of distal phalanx with multiple comminuted bony fragments consistent with provided history. There is soft tissue injury and irregularity at the tip of the finger adjacent to the fracture. Open fracture is highly suspected. IMPRESSION: There is avulsed fracture at the tip of distal phalanx with multiple comminuted bony fragments consistent with provided history. There is soft tissue injury and irregularity at the tip of the finger adjacent to the fracture. Open fracture is highly suspected. Electronically  Signed   By: Lahoma Crocker M.D.   On: 02/04/2015 16:04   I have personally reviewed and evaluated these images and lab results as part of my medical decision-making.   Assessment & Plan Laceration of the R index finger with soft tissue injury and near-avulsion of the pad of distal tip of his finger -Hand Surgery consult due to nature of injury and evidence of avulsion fracture on imaging  Dr.Kuzma will be in to evaluate the patient for further care of this injury  Dalia Heading, PA-C 02/10/15 [id number]  Nat Christen, MD 02/11/15 1325

## 2015-02-04 NOTE — ED Notes (Signed)
Pt arrives from home via EMS c/o lac to R index finger from table saw. Lac extends 3-4 cm down finger from tip in spiral, bleeding noted.  Dressing changed, pt applying pressure with finger elevated.

## 2015-02-04 NOTE — ED Notes (Signed)
Re-paged Dr. Fredna Dow to [phone]

## 2015-02-04 NOTE — Anesthesia Preprocedure Evaluation (Signed)
Anesthesia Evaluation  Patient identified by MRN, date of birth, ID band Patient awake    Reviewed: Allergy & Precautions, NPO status , Patient's Chart, lab work & pertinent test results  Airway Mallampati: I  TM Distance: >3 FB Neck ROM: Full    Dental  (+) Teeth Intact, Dental Advisory Given   Pulmonary    breath sounds clear to auscultation       Cardiovascular + Valvular Problems/Murmurs MR  Rhythm:Regular Rate:Normal     Neuro/Psych    GI/Hepatic   Endo/Other    Renal/GU      Musculoskeletal   Abdominal   Peds  Hematology   Anesthesia Other Findings   Reproductive/Obstetrics                             Anesthesia Physical Anesthesia Plan  ASA: III and emergent  Anesthesia Plan: General   Post-op Pain Management:    Induction: Intravenous  Airway Management Planned: Oral ETT  Additional Equipment:   Intra-op Plan:   Post-operative Plan: Extubation in OR  Informed Consent: I have reviewed the patients History and Physical, chart, labs and discussed the procedure including the risks, benefits and alternatives for the proposed anesthesia with the patient or authorized representative who has indicated his/her understanding and acceptance.   Dental advisory given  Plan Discussed with: CRNA, Anesthesiologist and Surgeon  Anesthesia Plan Comments:         Anesthesia Quick Evaluation

## 2015-02-04 NOTE — Anesthesia Procedure Notes (Signed)
Procedure Name: Intubation Date/Time: 02/04/2015 8:11 PM Performed by: Eligha Bridegroom Pre-anesthesia Checklist: Emergency Drugs available, Patient identified, Timeout performed, Suction available and Patient being monitored Patient Re-evaluated:Patient Re-evaluated prior to inductionOxygen Delivery Method: Circle system utilized Preoxygenation: Pre-oxygenation with 100% oxygen Intubation Type: IV induction, Rapid sequence and Cricoid Pressure applied Ventilation: Mask ventilation without difficulty and Oral airway inserted - appropriate to patient size Laryngoscope Size: Mac and 4 Grade View: Grade I Number of attempts: 1 Airway Equipment and Method: Stylet and LTA kit utilized Placement Confirmation: ETT inserted through vocal cords under direct vision,  breath sounds checked- equal and bilateral and positive ETCO2 Secured at: 23 cm Tube secured with: Tape Dental Injury: Teeth and Oropharynx as per pre-operative assessment

## 2015-02-05 ENCOUNTER — Encounter (HOSPITAL_COMMUNITY): Payer: Self-pay | Admitting: Orthopedic Surgery

## 2015-02-05 NOTE — Op Note (Signed)
Alec Maldonado, Alec Maldonado                  ACCOUNT NO.:  [id number]  MEDICAL RECORD NO.:  [id number]  LOCATION:  [facility]                         FACILITY:  [facility]  PHYSICIAN:  Leanora Cover, MD        DATE OF BIRTH:  1950-06-07  DATE OF PROCEDURE:  02/04/2015 DATE OF DISCHARGE:  02/04/2015                              OPERATIVE REPORT   PREOPERATIVE DIAGNOSIS:  Right index finger open distal phalanx fracture with skin and nail bed lacerations.  POSTOPERATIVE DIAGNOSIS:  Right index finger open distal phalanx fracture with skin and nail bed lacerations and ulnar digital nerve and artery laceration.  PROCEDURE:   1. Right index finger irrigation and debridement of open distal phalanx fracture 2. Open treatment right index finger open distal phalanx fracture 3. Repair of skin lacerations 4. Repair of right index finger radial digital nerve under microscope 5. Repair of right index finger radial digital artery under microscope.  SURGEON:  Leanora Cover, MD  ASSISTANT:  None.  ANESTHESIA:  General.  IV FLUIDS:  Per Anesthesia flow sheet.  ESTIMATED BLOOD LOSS:  Minimal.  COMPLICATIONS:  None.  SPECIMENS:  None.  TOURNIQUET TIME:  111 minutes.  DISPOSITION:  Stable to PACU.  INDICATIONS:  Alec Maldonado is a 64 year old male who this evening injured his right index finger on a table saw.  He was sent to [facility] Emergency Department where radiographs were taken revealing distal phalanx fracture.  I was consulted for management of the injury.  On examination, he had intact sensation, capillary refill on the fingertip. There was laceration at the tip of the finger including the nail bed. There was also a small laceration at the ulnar side of the middle phalanx distally.  I recommended irrigation and debridement of the fracture and repair of the wounds in the operating room.  Risks, benefits, and alternatives of surgery were discussed including risk of blood loss; infection; damage to  nerves, vessels, tendons, ligaments, bone; failure of surgery; need for additional surgery; complications with wound healing, continued pain.  He voiced understanding of these risks and elected to proceed.  OPERATIVE COURSE:  After being identified preoperatively by myself, the patient agreed upon procedure and site procedure.  Site was marked.  The risks, benefits, and alternatives of surgery were reviewed and he wished to proceed.  Surgical consent had been signed.  He was given IV Ancef as preoperative antibiotic prophylaxis.  His tetanus had been updated in the emergency department.  He was transferred to the operating room and placed on the operating table in supine position with the right upper extremity on arm board.  General anesthesia was induced by anesthesiologist.  The right upper extremity was prepped and draped in normal sterile orthopedic fashion.  Surgical pause was performed between surgeons, anesthesia, and operating room staff, and all were in agreement as to the patient, procedure, and site of procedure. Tourniquet at the proximal aspect of the extremity was inflated 250 mmHg after exsanguination of limb with an Esmarch bandage.  The wounds were explored.  There was no gross contamination.  The wound at the tip of the finger was a fishmouth type wound.  There was  bony fragmentation. Some of these fragments were removed.  The insertion of the FDP tendon was intact.  The ulnar side of the nail bed had been removed by the saw. The nail was removed with a Soil scientist.  The wound at the ulnar side of the distal aspect of the middle phalanx was explored.  There was laceration of the ulnar digital nerve and artery.  The wound was extended both proximally and distally to any visualization.  There was no violation of the tendon sheath.  The radial digital nerve and artery were outside the zone of injury.  The wounds were all copiously irrigated with sterile saline.  The  wounds distally were closed with a 5- 0 Monocryl suture in an interrupted fashion.  The tissue at the edge of the nail bed was reapproximated with a 6-0 chromic suture.  Attention was turned to the digital nerve and artery.  The microscope was brought in.  The proximal aspect of the artery was somewhat ragged.  There was a branch point with the branch going slightly more dorsally.  No distal stump was able to be retrieved that would be repairable.  The arterial ends were freshened and the artery reanastomosed to it's dorsal branch. The digital nerve had been lacerated at the trifurcation.  A 9-0 nylon suture was used again to reapproximate the nerve ends.  This was able to be done with a small gap.  The wound was then closed with 4-0 nylon in a horizontal mattress fashion.  A digital block was performed with 7 mL of 0.25% plain Marcaine to aid in postoperative analgesia.  A piece of Xeroform was placed in nail fold and all wounds dressed with sterile Xeroform, 4x4s, and wrapped with Kerlix bandage.  Dorsal blocking splint was placed and wrapped with Kerlix and Ace bandage.  Tourniquet was deflated 111 minutes.  Fingertips were pink with brisk capillary refill after deflation of tourniquet.  The tourniquet was deflated prior to placing the splint.  The capillary refill was slower in the index finger, but present.  There was bleeding from the nail bed and the distal wound.  The operative drapes were broken down.  The patient was awoken from anesthesia safely.  He was transferred back to stretcher and taken to PACU in stable condition.  I will see him back in the office in 1 week for postoperative followup.  I will give him Norco 5/325, 1-2 p.o. q.6 hours p.r.n. pain, dispensed #30 and aspirin 325 mg p.o. daily x30 days.     Leanora Cover, MD     KK/MEDQ  D:  02/04/2015  T:  02/05/2015  Job:  [id number]

## 2015-02-06 NOTE — Anesthesia Postprocedure Evaluation (Signed)
Anesthesia Post Note  Patient: Alec Maldonado  Procedure(s) Performed: Procedure(s) (LRB): IRRIGATION AND DEBRIDEMENT RIGHT INDEX FINGER OPEN FRACTURE AND REPAIR DIGITAL NERVE AND ARTERY (Right)  Patient location during evaluation: PACU Anesthesia Type: General Level of consciousness: awake and alert and oriented Pain management: pain level controlled Vital Signs Assessment: post-procedure vital signs reviewed and stable Respiratory status: spontaneous breathing, nonlabored ventilation and respiratory function stable Cardiovascular status: stable Anesthetic complications: no    Last Vitals:  Filed Vitals:   02/04/15 2230 02/04/15 2245  BP: 123/71 121/81  Pulse: 66 72  Temp: 36.5 C   Resp: 15 22    Last Pain:  Filed Vitals:   02/04/15 2255  PainSc: 2                  Aurel Nguyen A

## 2015-02-15 ENCOUNTER — Other Ambulatory Visit: Payer: Self-pay | Admitting: Family Medicine

## 2015-02-15 DIAGNOSIS — Z1159 Encounter for screening for other viral diseases: Secondary | ICD-10-CM

## 2015-02-15 DIAGNOSIS — E785 Hyperlipidemia, unspecified: Secondary | ICD-10-CM

## 2015-02-15 DIAGNOSIS — N400 Enlarged prostate without lower urinary tract symptoms: Secondary | ICD-10-CM

## 2015-02-16 ENCOUNTER — Other Ambulatory Visit (INDEPENDENT_AMBULATORY_CARE_PROVIDER_SITE_OTHER): Payer: Medicare Other

## 2015-02-16 ENCOUNTER — Encounter: Payer: Self-pay | Admitting: Radiology

## 2015-02-16 ENCOUNTER — Other Ambulatory Visit: Payer: Self-pay | Admitting: Family Medicine

## 2015-02-16 DIAGNOSIS — N400 Enlarged prostate without lower urinary tract symptoms: Secondary | ICD-10-CM | POA: Diagnosis not present

## 2015-02-16 DIAGNOSIS — E785 Hyperlipidemia, unspecified: Secondary | ICD-10-CM | POA: Diagnosis not present

## 2015-02-16 DIAGNOSIS — Z1159 Encounter for screening for other viral diseases: Secondary | ICD-10-CM

## 2015-02-16 LAB — COMPREHENSIVE METABOLIC PANEL
ALK PHOS: 59 U/L (ref 39–117)
ALT: 25 U/L (ref 0–53)
AST: 19 U/L (ref 0–37)
Albumin: 4.1 g/dL (ref 3.5–5.2)
BUN: 13 mg/dL (ref 6–23)
CALCIUM: 8.9 mg/dL (ref 8.4–10.5)
CO2: 25 mEq/L (ref 19–32)
CREATININE: 1.05 mg/dL (ref 0.40–1.50)
Chloride: 107 mEq/L (ref 96–112)
GFR: 75.29 mL/min (ref >60.00)
Glucose, Bld: 102 mg/dL — ABNORMAL HIGH (ref 70–99)
POTASSIUM: 4.2 meq/L (ref 3.5–5.1)
Sodium: 141 mEq/L (ref 135–145)
Total Bilirubin: 0.4 mg/dL (ref 0.2–1.2)
Total Protein: 6.3 g/dL (ref 6.0–8.3)

## 2015-02-16 LAB — LIPID PANEL
CHOL/HDL RATIO: 4
Cholesterol: 108 mg/dL (ref 0–200)
HDL: 26.4 mg/dL — AB (ref >39.00)
LDL CALC: 48 mg/dL (ref 0–99)
NONHDL: 81.52
TRIGLYCERIDES: 166 mg/dL — AB (ref 0.0–149.0)
VLDL: 33.2 mg/dL (ref 0.0–40.0)

## 2015-02-16 LAB — PSA: PSA: 0.75 ng/mL (ref 0.10–4.00)

## 2015-02-17 LAB — HEPATITIS C ANTIBODY: HCV Ab: NEGATIVE

## 2015-02-23 ENCOUNTER — Ambulatory Visit (INDEPENDENT_AMBULATORY_CARE_PROVIDER_SITE_OTHER): Payer: Medicare Other | Admitting: Family Medicine

## 2015-02-23 ENCOUNTER — Encounter: Payer: Self-pay | Admitting: Family Medicine

## 2015-02-23 VITALS — BP 120/80 | HR 68 | Temp 97.8°F | Ht 71.5 in | Wt 222.8 lb

## 2015-02-23 DIAGNOSIS — Z23 Encounter for immunization: Secondary | ICD-10-CM | POA: Diagnosis not present

## 2015-02-23 DIAGNOSIS — I519 Heart disease, unspecified: Secondary | ICD-10-CM

## 2015-02-23 DIAGNOSIS — Z0000 Encounter for general adult medical examination without abnormal findings: Secondary | ICD-10-CM | POA: Diagnosis not present

## 2015-02-23 DIAGNOSIS — Z7189 Other specified counseling: Secondary | ICD-10-CM | POA: Insufficient documentation

## 2015-02-23 DIAGNOSIS — N400 Enlarged prostate without lower urinary tract symptoms: Secondary | ICD-10-CM

## 2015-02-23 DIAGNOSIS — G43909 Migraine, unspecified, not intractable, without status migrainosus: Secondary | ICD-10-CM

## 2015-02-23 DIAGNOSIS — E785 Hyperlipidemia, unspecified: Secondary | ICD-10-CM

## 2015-02-23 DIAGNOSIS — N402 Nodular prostate without lower urinary tract symptoms: Secondary | ICD-10-CM | POA: Insufficient documentation

## 2015-02-23 DIAGNOSIS — R9430 Abnormal result of cardiovascular function study, unspecified: Secondary | ICD-10-CM

## 2015-02-23 MED ORDER — CARVEDILOL 6.25 MG PO TABS: ORAL_TABLET | ORAL | Status: DC

## 2015-02-23 MED ORDER — TAMSULOSIN HCL 0.4 MG PO CAPS: ORAL_CAPSULE | ORAL | Status: DC

## 2015-02-23 MED ORDER — BUTALBITAL-APAP-CAFFEINE 50-325-40 MG PO TABS: 1.0000 | ORAL_TABLET | Freq: Four times a day (QID) | ORAL | Status: DC | PRN

## 2015-02-23 MED ORDER — FINASTERIDE 5 MG PO TABS: ORAL_TABLET | ORAL | Status: DC

## 2015-02-23 MED ORDER — EZETIMIBE-SIMVASTATIN 10-80 MG PO TABS: ORAL_TABLET | ORAL | Status: DC

## 2015-02-23 NOTE — Assessment & Plan Note (Addendum)
Chronic, stable. Reviewed with patient, discussed healthy diet and lifestyle changes to affect improvement in #s. Continue vytorin.

## 2015-02-23 NOTE — Assessment & Plan Note (Signed)
Palpated today - offered urology referral. Pt declines. Will rpt eval in 6 months. Finasteride lowers PSA 50% at baseline.

## 2015-02-23 NOTE — Assessment & Plan Note (Signed)
Continue finasteride/flomax. Stable PSA. Possible prostate nodule - see above.

## 2015-02-23 NOTE — Assessment & Plan Note (Signed)
Improved after retirement. Rare fioricet use. Refilled today.

## 2015-02-23 NOTE — Progress Notes (Addendum)
BP 120/80 mmHg  Pulse 68  Temp(Src) 97.8 F (36.6 C) (Oral)  Ht 5' 11.5" (1.816 m)  Wt 222 lb 12.8 oz (101.061 kg)  BMI 30.64 kg/m2  SpO2 97%   CC: welcome to medicare visit  Subjective:    Patient ID: RAYFIELD LAFLEUR, male    DOB: August 31, 1950, 64 y.o.   MRN: [id number]  HPI: SAMEUL SCHRICK is a 64 y.o. male presenting on 02/23/2015 for Medicare Wellness   Hearing screen - passed Vision screen - passed Fall risk screen - passed Depression screen - passed  Preventative: Colonoscopy 2008 in [place], states normal for 10 yrs Prostate screen - yearly Flu - did not receive Td 2004. Tdap 2015 again 02/2015 Pneumonia shot - prevnar today Shingles shot - declines Advanced directive discussion - worked on this with Chief Executive Officer. Thinks has at home. Wife Hoyle Sauer would be HCPOA. Will review at home and bring me copy. Advanced directive packet provided today Seat belt use discussed Sunscreen use and skin screen discussed. No changing moles on skin.   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 Interim medical history since our last visit reviewed. Allergies and medications reviewed and updated. Current Outpatient Prescriptions on File Prior to Visit  Medication Sig  . aspirin EC 325 MG tablet Take 1 tablet (325 mg total) by mouth daily.  Marland Kitchen HYDROcodone-acetaminophen (NORCO) 5-325 MG tablet 1-2 tabs po q6 hours prn pain (Patient not taking: Reported on 02/23/2015)  . Omega-3 Fatty Acids (FISH OIL) 1200 MG CAPS Take 1,200 mg by mouth daily. Reported on 02/23/2015  . sulfamethoxazole-trimethoprim (BACTRIM DS) 800-160 MG tablet Take 1 tablet by mouth 2 (two) times daily. (Patient not taking: Reported on 02/23/2015)    No current facility-administered medications on file prior to visit.    Review of Systems Per HPI unless specifically indicated in ROS section     Objective:    BP 120/80 mmHg  Pulse 68  Temp(Src) 97.8 F (36.6 C) (Oral)  Ht 5' 11.5" (1.816 m)  Wt 222 lb 12.8 oz (101.061 kg)  BMI 30.64 kg/m2  SpO2 97%  Wt Readings from Last 3 Encounters:  02/23/15 222 lb 12.8 oz (101.061 kg)  02/04/15 225 lb (102.059 kg)  01/14/14 215 lb 8 oz (97.75 kg)    Physical Exam  Constitutional: He is oriented to person, place, and time. He appears well-developed and well-nourished. No distress.  HENT:  Head: Normocephalic and atraumatic.  Right Ear: Hearing, tympanic membrane, external ear and ear canal normal.  Left Ear: Hearing, tympanic membrane, external ear and ear canal normal.  Nose: Nose normal.  Mouth/Throat: Uvula is midline, oropharynx is clear and moist and mucous membranes are normal. No oropharyngeal exudate, posterior oropharyngeal edema or posterior oropharyngeal erythema.  Eyes: Conjunctivae and EOM are normal. Pupils are equal, round, and reactive to light. No scleral icterus.  Neck: Normal range of motion. Neck supple. No thyromegaly present.  Cardiovascular: Normal rate, regular rhythm, normal heart sounds and intact distal pulses.   No murmur heard. Pulses:      Radial pulses are 2+ on the right side, and 2+ on the left side.  Pulmonary/Chest: Effort normal and breath sounds normal. No respiratory distress. He has no wheezes. He has no rales.  Abdominal: Soft. Bowel sounds are  normal. He exhibits no distension and no mass. There is no tenderness. There is no rebound and no guarding.  Genitourinary: Rectum normal. Rectal exam shows no external hemorrhoid, no internal hemorrhoid, no fissure, no mass, no tenderness and anal tone normal. Prostate is not enlarged and not tender.  Slightly enlarged R prostatic lobe with ?nodule  Musculoskeletal: Normal range of motion. He exhibits  no edema.  Lymphadenopathy:    He has no cervical adenopathy.  Neurological: He is alert and oriented to person, place, and time.  CN grossly intact, station and gait intact Recall 3/3 Calculation 3/5 serial 7s  Skin: Skin is warm and dry. No rash noted.  Psychiatric: He has a normal mood and affect. His behavior is normal. Judgment and thought content normal.  Nursing note and vitals reviewed.  Results for orders placed or performed in visit on 02/16/15  Lipid panel  Result Value Ref Range   Cholesterol 108 0 - 200 mg/dL   Triglycerides 166.0 (H) 0.0 - 149.0 mg/dL   HDL 26.40 (L) >39.00 mg/dL   VLDL 33.2 0.0 - 40.0 mg/dL   LDL Cholesterol 48 0 - 99 mg/dL   Total CHOL/HDL Ratio 4    NonHDL 81.52   Comprehensive metabolic panel  Result Value Ref Range   Sodium 141 135 - 145 mEq/L   Potassium 4.2 3.5 - 5.1 mEq/L   Chloride 107 96 - 112 mEq/L   CO2 25 19 - 32 mEq/L   Glucose, Bld 102 (H) 70 - 99 mg/dL   BUN 13 6 - 23 mg/dL   Creatinine, Ser 1.05 0.40 - 1.50 mg/dL   Total Bilirubin 0.4 0.2 - 1.2 mg/dL   Alkaline Phosphatase 59 39 - 117 U/L   AST 19 0 - 37 U/L   ALT 25 0 - 53 U/L   Total Protein 6.3 6.0 - 8.3 g/dL   Albumin 4.1 3.5 - 5.2 g/dL   Calcium 8.9 8.4 - 10.5 mg/dL   GFR 75.29 >60.00 mL/min  PSA  Result Value Ref Range   PSA 0.75 0.10 - 4.00 ng/mL      Assessment & Plan:   Problem List Items Addressed This Visit    Welcome to Medicare preventive visit - Primary    I have personally reviewed the Medicare Annual Wellness questionnaire and have noted 1. The patient's medical and social history 2. Their use of alcohol, tobacco or illicit drugs 3. Their current medications and supplements 4. The patient's functional ability including ADL's, fall risks, home safety risks and hearing or visual impairment. Cognitive function has been assessed and addressed as indicated.  5. Diet and physical activity 6. Evidence for depression or mood disorders The patients weight,  height, BMI have been recorded in the chart. I have made referrals, counseling and provided education to the patient based on review of the above and I have provided the pt with a written personalized care plan for preventive services. Provider list updated.. See scanned questionairre as needed for further documentation. Reviewed preventative protocols and updated unless pt declined.   EKG - RBBB first noted 03/2012, NSR rate 70s, normal axis, intervals, unchanged from prior EKG       Relevant Orders   EKG 12-Lead (Completed)   Right ventricular dysfunction   Relevant Medications   carvedilol (COREG) 6.25 MG tablet   ezetimibe-simvastatin (VYTORIN) 10-80 MG tablet   Prostate nodule    Palpated today - offered urology referral. Pt declines. Will rpt eval in 6 months. Finasteride lowers  PSA 50% at baseline.       Migraines    Improved after retirement. Rare fioricet use. Refilled today.      Relevant Medications   carvedilol (COREG) 6.25 MG tablet   ezetimibe-simvastatin (VYTORIN) 10-80 MG tablet   butalbital-acetaminophen-caffeine (FIORICET, ESGIC) 50-325-40 MG tablet   RESOLVED: Ejection fraction   Dyslipidemia    Chronic, stable. Reviewed with patient, discussed healthy diet and lifestyle changes to affect improvement in #s. Continue vytorin.      Relevant Medications   ezetimibe-simvastatin (VYTORIN) 10-80 MG tablet   BPH (benign prostatic hypertrophy)    Continue finasteride/flomax. Stable PSA. Possible prostate nodule - see above.      Relevant Medications   finasteride (PROSCAR) 5 MG tablet   tamsulosin (FLOMAX) 0.4 MG CAPS capsule   Advanced care planning/counseling discussion    Advanced directive discussion - worked on this with Chief Executive Officer. Thinks has at home. Wife Hoyle Sauer would be HCPOA. Will review at home and bring me copy. Advanced directive packet provided today       Other Visit Diagnoses    Need for vaccination with 13-polyvalent pneumococcal conjugate vaccine         Relevant Orders    Pneumococcal conjugate vaccine 13-valent IM (Completed)        Follow up plan: Return in about 1 year (around 02/23/2016), or as needed, for medicare wellness visit.

## 2015-02-23 NOTE — Patient Instructions (Addendum)
Return at your convenience for nurse visit for welcome to medicare EKG (machine down today). prevnar today. Bring me copy at your convienence of your living will and health care power of attorney form to update your chart. Packet provided today. We will recheck prostate 6 months - reassess possible nodule.  meds refilled today.  Health Maintenance, Male A healthy lifestyle and preventative care can promote health and wellness.  Maintain regular health, dental, and eye exams.  Eat a healthy diet. Foods like vegetables, fruits, whole grains, low-fat dairy products, and lean protein foods contain the nutrients you need and are low in calories. Decrease your intake of foods high in solid fats, added sugars, and salt. Get information about a proper diet from your health care provider, if necessary.  Regular physical exercise is one of the most important things you can do for your health. Most adults should get at least 150 minutes of moderate-intensity exercise (any activity that increases your heart rate and causes you to sweat) each week. In addition, most adults need muscle-strengthening exercises on 2 or more days a week.   Maintain a healthy weight. The body mass index (BMI) is a screening tool to identify possible weight problems. It provides an estimate of body fat based on height and weight. Your health care provider can find your BMI and can help you achieve or maintain a healthy weight. For males 20 years and older:  A BMI below 18.5 is considered underweight.  A BMI of 18.5 to 24.9 is normal.  A BMI of 25 to 29.9 is considered overweight.  A BMI of 30 and above is considered obese.  Maintain normal blood lipids and cholesterol by exercising and minimizing your intake of saturated fat. Eat a balanced diet with plenty of fruits and vegetables. Blood tests for lipids and cholesterol should begin at age 70 and be repeated every 5 years. If your lipid or cholesterol levels are high, you  are over age 26, or you are at high risk for heart disease, you may need your cholesterol levels checked more frequently.Ongoing high lipid and cholesterol levels should be treated with medicines if diet and exercise are not working.  If you smoke, find out from your health care provider how to quit. If you do not use tobacco, do not start.  Lung cancer screening is recommended for adults aged 74-80 years who are at high risk for developing lung cancer because of a history of smoking. A yearly low-dose CT scan of the lungs is recommended for people who have at least a 30-pack-year history of smoking and are current smokers or have quit within the past 15 years. A pack year of smoking is smoking an average of 1 pack of cigarettes a day for 1 year (for example, a 30-pack-year history of smoking could mean smoking 1 pack a day for 30 years or 2 packs a day for 15 years). Yearly screening should continue until the smoker has stopped smoking for at least 15 years. Yearly screening should be stopped for people who develop a health problem that would prevent them from having lung cancer treatment.  If you choose to drink alcohol, do not have more than 2 drinks per day. One drink is considered to be 12 oz (360 mL) of beer, 5 oz (150 mL) of wine, or 1.5 oz (45 mL) of liquor.  Avoid the use of street drugs. Do not share needles with anyone. Ask for help if you need support or instructions about stopping  the use of drugs.  High blood pressure causes heart disease and increases the risk of stroke. High blood pressure is more likely to develop in:  People who have blood pressure in the end of the normal range (100-139/85-89 mm Hg).  People who are overweight or obese.  People who are African American.  If you are 73-54 years of age, have your blood pressure checked every 3-5 years. If you are 38 years of age or older, have your blood pressure checked every year. You should have your blood pressure measured  twice--once when you are at a hospital or clinic, and once when you are not at a hospital or clinic. Record the average of the two measurements. To check your blood pressure when you are not at a hospital or clinic, you can use:  An automated blood pressure machine at a pharmacy.  A home blood pressure monitor.  If you are 44-38 years old, ask your health care provider if you should take aspirin to prevent heart disease.  Diabetes screening involves taking a blood sample to check your fasting blood sugar level. This should be done once every 3 years after age 13 if you are at a normal weight and without risk factors for diabetes. Testing should be considered at a younger age or be carried out more frequently if you are overweight and have at least 1 risk factor for diabetes.  Colorectal cancer can be detected and often prevented. Most routine colorectal cancer screening begins at the age of 18 and continues through age 60. However, your health care provider may recommend screening at an earlier age if you have risk factors for colon cancer. On a yearly basis, your health care provider may provide home test kits to check for hidden blood in the stool. A small camera at the end of a tube may be used to directly examine the colon (sigmoidoscopy or colonoscopy) to detect the earliest forms of colorectal cancer. Talk to your health care provider about this at age 31 when routine screening begins. A direct exam of the colon should be repeated every 5-10 years through age 47, unless early forms of precancerous polyps or small growths are found.  People who are at an increased risk for hepatitis B should be screened for this virus. You are considered at high risk for hepatitis B if:  You were born in a country where hepatitis B occurs often. Talk with your health care provider about which countries are considered high risk.  Your parents were born in a high-risk country and you have not received a shot to  protect against hepatitis B (hepatitis B vaccine).  You have HIV or AIDS.  You use needles to inject street drugs.  You live with, or have sex with, someone who has hepatitis B.  You are a man who has sex with other men (MSM).  You get hemodialysis treatment.  You take certain medicines for conditions like cancer, organ transplantation, and autoimmune conditions.  Hepatitis C blood testing is recommended for all people born from [birth year] through 1965 and any individual with known risk factors for hepatitis C.  Healthy men should no longer receive prostate-specific antigen (PSA) blood tests as part of routine cancer screening. Talk to your health care provider about prostate cancer screening.  Testicular cancer screening is not recommended for adolescents or adult males who have no symptoms. Screening includes self-exam, a health care provider exam, and other screening tests. Consult with your health care provider about any  symptoms you have or any concerns you have about testicular cancer.  Practice safe sex. Use condoms and avoid high-risk sexual practices to reduce the spread of sexually transmitted infections (STIs).  You should be screened for STIs, including gonorrhea and chlamydia if:  You are sexually active and are younger than 24 years.  You are older than 24 years, and your health care provider tells you that you are at risk for this type of infection.  Your sexual activity has changed since you were last screened, and you are at an increased risk for chlamydia or gonorrhea. Ask your health care provider if you are at risk.  If you are at risk of being infected with HIV, it is recommended that you take a prescription medicine daily to prevent HIV infection. This is called pre-exposure prophylaxis (PrEP). You are considered at risk if:  You are a man who has sex with other men (MSM).  You are a heterosexual man who is sexually active with multiple partners.  You take drugs by  injection.  You are sexually active with a partner who has HIV.  Talk with your health care provider about whether you are at high risk of being infected with HIV. If you choose to begin PrEP, you should first be tested for HIV. You should then be tested every 3 months for as long as you are taking PrEP.  Use sunscreen. Apply sunscreen liberally and repeatedly throughout the day. You should seek shade when your shadow is shorter than you. Protect yourself by wearing long sleeves, pants, a wide-brimmed hat, and sunglasses year round whenever you are outdoors.  Tell your health care provider of new moles or changes in moles, especially if there is a change in shape or color. Also, tell your health care provider if a mole is larger than the size of a pencil eraser.  A one-time screening for abdominal aortic aneurysm (AAA) and surgical repair of large AAAs by ultrasound is recommended for men aged 59-75 years who are current or former smokers.  Stay current with your vaccines (immunizations).   This information is not intended to replace advice given to you by your health care provider. Make sure you discuss any questions you have with your health care provider.   Document Released: 08/19/2007 Document Revised: 03/13/2014 Document Reviewed: 07/18/2010 Elsevier Interactive Patient Education [employer].

## 2015-02-23 NOTE — Addendum Note (Signed)
Addended by: Cloyd Stagers B on: 02/23/2015 12:49 PM   Modules accepted: Orders

## 2015-02-23 NOTE — Assessment & Plan Note (Addendum)
I have personally reviewed the Medicare Annual Wellness questionnaire and have noted 1. The patient's medical and social history 2. Their use of alcohol, tobacco or illicit drugs 3. Their current medications and supplements 4. The patient's functional ability including ADL's, fall risks, home safety risks and hearing or visual impairment. Cognitive function has been assessed and addressed as indicated.  5. Diet and physical activity 6. Evidence for depression or mood disorders The patients weight, height, BMI have been recorded in the chart. I have made referrals, counseling and provided education to the patient based on review of the above and I have provided the pt with a written personalized care plan for preventive services. Provider list updated.. See scanned questionairre as needed for further documentation. Reviewed preventative protocols and updated unless pt declined.   EKG - RBBB first noted 03/2012, NSR rate 70s, normal axis, intervals, unchanged from prior EKG

## 2015-02-23 NOTE — Assessment & Plan Note (Signed)
Advanced directive discussion - worked on this with lawyer. Thinks has at home. Wife Carolyn would be HCPOA. Will review at home and bring me copy. Advanced directive packet provided today 

## 2015-03-01 ENCOUNTER — Encounter: Payer: Self-pay | Admitting: Family Medicine

## 2015-03-03 ENCOUNTER — Encounter: Payer: Self-pay | Admitting: *Deleted

## 2015-03-07 DIAGNOSIS — C44519 Basal cell carcinoma of skin of other part of trunk: Secondary | ICD-10-CM

## 2015-03-07 HISTORY — DX: Basal cell carcinoma of skin of other part of trunk: C44.519

## 2015-03-09 ENCOUNTER — Encounter: Payer: Self-pay | Admitting: Family Medicine

## 2015-03-10 ENCOUNTER — Telehealth: Payer: Self-pay | Admitting: Family Medicine

## 2015-03-10 NOTE — Telephone Encounter (Signed)
Spoke with patient and answered question about RBBB.

## 2015-03-10 NOTE — Telephone Encounter (Signed)
Pt called he received a letter in mail and has ? About it

## 2015-08-17 ENCOUNTER — Ambulatory Visit (INDEPENDENT_AMBULATORY_CARE_PROVIDER_SITE_OTHER): Payer: Medicare Other | Admitting: Family Medicine

## 2015-08-17 ENCOUNTER — Encounter: Payer: Self-pay | Admitting: Family Medicine

## 2015-08-17 VITALS — BP 122/82 | HR 72 | Temp 97.4°F | Wt 226.2 lb

## 2015-08-17 DIAGNOSIS — N400 Enlarged prostate without lower urinary tract symptoms: Secondary | ICD-10-CM | POA: Diagnosis not present

## 2015-08-17 DIAGNOSIS — N402 Nodular prostate without lower urinary tract symptoms: Secondary | ICD-10-CM

## 2015-08-17 LAB — PSA: PSA: 0.74 ng/mL (ref 0.10–4.00)

## 2015-08-17 NOTE — Progress Notes (Signed)
BP 122/82 mmHg  Pulse 72  Temp(Src) 97.4 F (36.3 C) (Oral)  Wt 226 lb 4 oz (102.626 kg)   CC: 7mo f/u visit  Subjective:    Patient ID: Alec Maldonado, male    DOB: 03/17/1950, 65 y.o.   MRN: [id number]  HPI: Alec Maldonado is a 65 y.o. male presenting on 08/17/2015 for Follow-up   Prostate nodule - noted 02/2015, offered urology referral, pt declined. Due for rpt check today. Complaint with proscar and flomax for BPH.   HLD - worried vytorin may be causing some myalgia predominantly R posterior thigh. lipitor caused myalgias as well. Desires to continue vytorin for now.   HTN - Compliant with current antihypertensive regimen of carvedilol 6.25mg  bid.  Does no check blood pressures at home. No low blood pressure readings or symptoms of dizziness/syncope. Denies HA, vision changes, CP/tightness, SOB, leg swelling.    Slightly enlarged R prostatic lobe with ?nodule   Relevant past medical, surgical, family and social history reviewed and updated as indicated. Interim medical history since our last visit reviewed. Allergies and medications reviewed and updated. Current Outpatient Prescriptions on File Prior to Visit  Medication Sig  . butalbital-acetaminophen-caffeine (FIORICET, ESGIC) 50-325-40 MG tablet Take 1 tablet by mouth every 6 (six) hours as needed.  . carvedilol (COREG) 6.25 MG tablet Take 1 tablet twice a day with a meal  . ezetimibe-simvastatin (VYTORIN) 10-80 MG tablet TAKE 1 TABLET BY MOUTH AT BEDTIME.  . finasteride (PROSCAR) 5 MG tablet TAKE 1 TABLET BY MOUTH DAILY.  Marland Kitchen Omega-3 Fatty Acids (FISH OIL) 1200 MG CAPS Take 1,200 mg by mouth daily. Reported on 02/23/2015  . tamsulosin (FLOMAX) 0.4 MG CAPS capsule TAKE 1 CAPSULE BY MOUTH AT BEDTIME   No current facility-administered medications on file prior to visit.    Review of Systems Per HPI unless specifically indicated in ROS section     Objective:    BP 122/82 mmHg  Pulse 72  Temp(Src) 97.4 F (36.3 C) (Oral)   Wt 226 lb 4 oz (102.626 kg)  Wt Readings from Last 3 Encounters:  08/17/15 226 lb 4 oz (102.626 kg)  02/23/15 222 lb 12.8 oz (101.061 kg)  02/04/15 225 lb (102.059 kg)    Physical Exam  Constitutional: He appears well-developed and well-nourished. No distress.  HENT:  Mouth/Throat: Oropharynx is clear and moist. No oropharyngeal exudate.  Cardiovascular: Normal rate, regular rhythm, normal heart sounds and intact distal pulses.   No murmur heard. Pulmonary/Chest: Effort normal and breath sounds normal. No respiratory distress. He has no wheezes. He has no rales.  Genitourinary: Rectum normal and prostate normal. Rectal exam shows no external hemorrhoid, no internal hemorrhoid, no fissure, no mass, no tenderness and anal tone normal. Prostate is not enlarged and not tender.  Slight firmness without enlargement R prostate lobe  Nursing note and vitals reviewed.  Results for orders placed or performed in visit on 02/16/15  Lipid panel  Result Value Ref Range   Cholesterol 108 0 - 200 mg/dL   Triglycerides 166.0 (H) 0.0 - 149.0 mg/dL   HDL 26.40 (L) >39.00 mg/dL   VLDL 33.2 0.0 - 40.0 mg/dL   LDL Cholesterol 48 0 - 99 mg/dL   Total CHOL/HDL Ratio 4    NonHDL 81.52   Comprehensive metabolic panel  Result Value Ref Range   Sodium 141 135 - 145 mEq/L   Potassium 4.2 3.5 - 5.1 mEq/L   Chloride 107 96 - 112 mEq/L   CO2  25 19 - 32 mEq/L   Glucose, Bld 102 (H) 70 - 99 mg/dL   BUN 13 6 - 23 mg/dL   Creatinine, Ser 1.05 0.40 - 1.50 mg/dL   Total Bilirubin 0.4 0.2 - 1.2 mg/dL   Alkaline Phosphatase 59 39 - 117 U/L   AST 19 0 - 37 U/L   ALT 25 0 - 53 U/L   Total Protein 6.3 6.0 - 8.3 g/dL   Albumin 4.1 3.5 - 5.2 g/dL   Calcium 8.9 8.4 - 10.5 mg/dL   GFR 75.29 >60.00 mL/min  PSA  Result Value Ref Range   PSA 0.75 0.10 - 4.00 ng/mL      Assessment & Plan:   Problem List Items Addressed This Visit    BPH (benign prostatic hypertrophy)    Continue finasteride/flomax. PSA stable.  See below.       Relevant Orders   PSA   Prostate nodule - Primary    Today I did not palpate true nodule, not significant enlargement of prostate gland. Slight firmness to right lobe noted. Discussed options of uro referral vs recheck 6 months - pt agrees with rpt PSA/DRE at medicare wellness visit. He is on finasteride which will decrease PSA baseline by ~50%.       Relevant Orders   PSA       Follow up plan: Return in about 6 months (around 02/16/2016), or as needed, for medicare wellness visit.  Ria Bush, MD

## 2015-08-17 NOTE — Patient Instructions (Signed)
Prostate overall stable - we will check PSA today and if no significant change from last check, we will just recheck levels and exam in 6 months.  Continue blood pressure and cholesterol medicines as up to now.

## 2015-08-17 NOTE — Progress Notes (Signed)
Pre visit review using our clinic review tool, if applicable. No additional management support is needed unless otherwise documented below in the visit note. 

## 2015-08-17 NOTE — Assessment & Plan Note (Signed)
Today I did not palpate true nodule, not significant enlargement of prostate gland. Slight firmness to right lobe noted. Discussed options of uro referral vs recheck 6 months - pt agrees with rpt PSA/DRE at medicare wellness visit. He is on finasteride which will decrease PSA baseline by ~50%.

## 2015-08-17 NOTE — Assessment & Plan Note (Signed)
Continue finasteride/flomax. PSA stable. See below.

## 2015-08-18 ENCOUNTER — Encounter: Payer: Self-pay | Admitting: *Deleted

## 2015-08-24 ENCOUNTER — Ambulatory Visit: Payer: Medicare Other | Admitting: Family Medicine

## 2016-02-23 ENCOUNTER — Other Ambulatory Visit: Payer: Self-pay | Admitting: Family Medicine

## 2016-02-23 DIAGNOSIS — E785 Hyperlipidemia, unspecified: Secondary | ICD-10-CM

## 2016-02-23 DIAGNOSIS — N402 Nodular prostate without lower urinary tract symptoms: Secondary | ICD-10-CM

## 2016-02-24 ENCOUNTER — Ambulatory Visit (INDEPENDENT_AMBULATORY_CARE_PROVIDER_SITE_OTHER): Payer: Medicare Other

## 2016-02-24 ENCOUNTER — Ambulatory Visit: Payer: Medicare Other

## 2016-02-24 VITALS — BP 118/80 | HR 73 | Temp 97.7°F | Ht 70.25 in | Wt 228.5 lb

## 2016-02-24 DIAGNOSIS — Z0000 Encounter for general adult medical examination without abnormal findings: Secondary | ICD-10-CM | POA: Diagnosis not present

## 2016-02-24 DIAGNOSIS — E785 Hyperlipidemia, unspecified: Secondary | ICD-10-CM

## 2016-02-24 DIAGNOSIS — N402 Nodular prostate without lower urinary tract symptoms: Secondary | ICD-10-CM | POA: Diagnosis not present

## 2016-02-24 DIAGNOSIS — Z23 Encounter for immunization: Secondary | ICD-10-CM | POA: Diagnosis not present

## 2016-02-24 LAB — BASIC METABOLIC PANEL
BUN: 17 mg/dL (ref 6–23)
CALCIUM: 9.3 mg/dL (ref 8.4–10.5)
CO2: 28 mEq/L (ref 19–32)
CREATININE: 1.09 mg/dL (ref 0.40–1.50)
Chloride: 107 mEq/L (ref 96–112)
GFR: 71.89 mL/min (ref >60.00)
GLUCOSE: 91 mg/dL (ref 70–99)
Potassium: 4.4 mEq/L (ref 3.5–5.1)
Sodium: 141 mEq/L (ref 135–145)

## 2016-02-24 LAB — LIPID PANEL
CHOL/HDL RATIO: 4
CHOLESTEROL: 125 mg/dL (ref 0–200)
HDL: 31.4 mg/dL — AB (ref >39.00)
LDL CALC: 66 mg/dL (ref 0–99)
NonHDL: 94.03
TRIGLYCERIDES: 141 mg/dL (ref 0.0–149.0)
VLDL: 28.2 mg/dL (ref 0.0–40.0)

## 2016-02-24 LAB — HEPATIC FUNCTION PANEL
ALBUMIN: 4.4 g/dL (ref 3.5–5.2)
ALT: 26 U/L (ref 0–53)
AST: 19 U/L (ref 0–37)
Alkaline Phosphatase: 61 U/L (ref 39–117)
Bilirubin, Direct: 0.1 mg/dL (ref 0.0–0.3)
TOTAL PROTEIN: 6.8 g/dL (ref 6.0–8.3)
Total Bilirubin: 0.6 mg/dL (ref 0.2–1.2)

## 2016-02-24 LAB — PSA: PSA: 0.66 ng/mL (ref 0.10–4.00)

## 2016-02-24 NOTE — Progress Notes (Signed)
PCP notes:   Health maintenance:  Shingles - postponed/insurance Flu vaccine - administered PPSV23 - administered  Abnormal screenings:   None  Patient concerns:   None  Nurse concerns:  None  Next PCP appt:   02/29/2016 @ 0930

## 2016-02-24 NOTE — Progress Notes (Signed)
Pre visit review using our clinic review tool, if applicable. No additional management support is needed unless otherwise documented below in the visit note. 

## 2016-02-24 NOTE — Patient Instructions (Signed)
Alec Maldonado , Thank you for taking time to come for your Medicare Wellness Visit. I appreciate your ongoing commitment to your health goals. Please review the following plan we discussed and let me know if I can assist you in the future.   These are the goals we discussed: Goals    . Increase physical activity          Starting 02/24/2016, I will continue to exercise at least 60 min 3 days per week.        This is a list of the screening recommended for you and due dates:  Health Maintenance  Topic Date Due  . Shingles Vaccine  02/23/2017*  . Colon Cancer Screening  07/01/2016  . DTaP/Tdap/Td vaccine (2 - Td) 02/03/2025  . Tetanus Vaccine  02/03/2025  . Flu Shot  Completed  .  Hepatitis C: One time screening is recommended by Center for Disease Control  (CDC) for  adults born from [birth year] through 1966.   Completed  . Pneumonia vaccines  Completed  *Topic was postponed. The date shown is not the original due date.   Preventive Care for Adults  A healthy lifestyle and preventive care can promote health and wellness. Preventive health guidelines for adults include the following key practices.  . A routine yearly physical is a good way to check with your health care provider about your health and preventive screening. It is a chance to share any concerns and updates on your health and to receive a thorough exam.  . Visit your dentist for a routine exam and preventive care every 6 months. Brush your teeth twice a day and floss once a day. Good oral hygiene prevents tooth decay and gum disease.  . The frequency of eye exams is based on your age, health, family medical history, use  of contact lenses, and other factors. Follow your health care provider's ecommendations for frequency of eye exams.  . Eat a healthy diet. Foods like vegetables, fruits, whole grains, low-fat dairy products, and lean protein foods contain the nutrients you need without too many calories. Decrease your intake of  foods high in solid fats, added sugars, and salt. Eat the right amount of calories for you. Get information about a proper diet from your health care provider, if necessary.  . Regular physical exercise is one of the most important things you can do for your health. Most adults should get at least 150 minutes of moderate-intensity exercise (any activity that increases your heart rate and causes you to sweat) each week. In addition, most adults need muscle-strengthening exercises on 2 or more days a week.  Silver Sneakers may be a benefit available to you. To determine eligibility, you may visit the website: www.silversneakers.com or contact program at (313)260-0461 Mon-Fri between 8AM-8PM.   . Maintain a healthy weight. The body mass index (BMI) is a screening tool to identify possible weight problems. It provides an estimate of body fat based on height and weight. Your health care provider can find your BMI and can help you achieve or maintain a healthy weight.   For adults 20 years and older: ? A BMI below 18.5 is considered underweight. ? A BMI of 18.5 to 24.9 is normal. ? A BMI of 25 to 29.9 is considered overweight. ? A BMI of 30 and above is considered obese.   . Maintain normal blood lipids and cholesterol levels by exercising and minimizing your intake of saturated fat. Eat a balanced diet with plenty of fruit  and vegetables. Blood tests for lipids and cholesterol should begin at age 27 and be repeated every 5 years. If your lipid or cholesterol levels are high, you are over 50, or you are at high risk for heart disease, you may need your cholesterol levels checked more frequently. Ongoing high lipid and cholesterol levels should be treated with medicines if diet and exercise are not working.  . If you smoke, find out from your health care provider how to quit. If you do not use tobacco, please do not start.  . If you choose to drink alcohol, please do not consume more than 2 drinks per  day. One drink is considered to be 12 ounces (355 mL) of beer, 5 ounces (148 mL) of wine, or 1.5 ounces (44 mL) of liquor.  . If you are 40-69 years old, ask your health care provider if you should take aspirin to prevent strokes.  . Use sunscreen. Apply sunscreen liberally and repeatedly throughout the day. You should seek shade when your shadow is shorter than you. Protect yourself by wearing long sleeves, pants, a wide-brimmed hat, and sunglasses year round, whenever you are outdoors.  . Once a month, do a whole body skin exam, using a mirror to look at the skin on your back. Tell your health care provider of new moles, moles that have irregular borders, moles that are larger than a pencil eraser, or moles that have changed in shape or color.

## 2016-02-24 NOTE — Progress Notes (Signed)
Subjective:   Alec Maldonado is a 65 y.o. male who presents for Medicare Annual/Subsequent preventive examination.  Review of Systems:  N/A Cardiac Risk Factors include: advanced age (>1men, >23 women);male gender;obesity (BMI >30kg/m2);dyslipidemia     Objective:    Vitals: BP 118/80 (BP Location: Left Arm, Patient Position: Sitting, Cuff Size: Normal)   Pulse 73   Temp 97.7 F (36.5 C) (Oral)   Ht 5' 10.25" (1.784 m) Comment: no shoes  Wt 228 lb 8 oz (103.6 kg)   SpO2 97%   BMI 32.55 kg/m   Body mass index is 32.55 kg/m.  Tobacco History  Smoking Status  . Never Smoker  Smokeless Tobacco  . Never Used     Counseling given: No   Past Medical History:  Diagnosis Date  . Aortic insufficiency    Mild, echo, January, 2014  . BPH (benign prostatic hypertrophy)   . Chest discomfort    nuclear 06/24/09, suggestion prior small inferior MI with moderate peri-infarvt ischemia versus variable diaphragmatic attenuation  . Colon polyps    colonoscopy 07/02/06, repeat in 10 years  . Dyslipidemia   . Ejection fraction    EF 55%, echo, October, 2010  //   EF 45-50%,  catheterization, 2011  . Migraines   . Mitral regurgitation    Mitral valve repair 2001  . Palpitations 07/2009    helped with carvedilol   . Pleurisy   . Postural dizziness    mild, no orthostatic changes by blood pressure check  . Pre-syncope    January, 2014  . RBBB    First seen January, 2014  . Right ventricular dysfunction    Mild/moderate right ventricular dysfunction by echo, January, 2014, no tricuspid regurgitation, therefore right heart pressure could not be estimated.  . Shortness of breath   . Status post mitral valve repair    2001,  //  echo, October, 2010, good function of the repaired mitral valve  . SVT (supraventricular tachycardia) ([place]) 03/11/2013   Isolated episodes - last 03/2013 after flu treated with adenosine cardioversion in ER.    Past Surgical History:  Procedure Laterality Date    . aneurysmal dilatation     proximal LAD cath 2001, reoeat catheter 2004, no significant change  . aneurysmal dilatiation  07/15/09   no change, normal right heart filling pressures and LVEDP- EF 45-50%  . APPENDECTOMY  1970  . CARDIAC CATHETERIZATION  2010   mild global LV dysfunction EF 45-50%, no significant CAD obstruction, aneurysmal dilatation LAD  . COLONOSCOPY  2008  . CORONARY ANGIOPLASTY  09/1999   severe MR o/w normal limits  . CORONARY ANGIOPLASTY  12/19/02   pos. CAD, mild LV dysfunction- med treatment  . HERNIA REPAIR  01/11/05   left, Dr. Hassell Done  . I&D EXTREMITY Right 02/04/2015   Procedure: IRRIGATION AND DEBRIDEMENT RIGHT INDEX FINGER OPEN FRACTURE AND REPAIR DIGITAL NERVE AND ARTERY;  Surgeon: Leanora Cover, MD;  Location: [facility];  Service: Orthopedics;  Laterality: Right;  . MITRAL VALVE REPAIR  01/22/00   good mitral valve function- echo 12/2008- EF 55% / nuclear 06/2009- EF 36%,? reliable? / EF 45-50%, cath rated 02/2010  . ORIF ELBOW FRACTURE     left  . THYROID SURGERY    . THYROIDECTOMY     left, due to goiter 1994   Family History  Problem Relation Age of Onset  . Alzheimer's disease Mother   . Heart disease Mother     CHF  . Arthritis  Father   . Heart disease Father     leaking mitral valve  . Benign prostatic hyperplasia Father   . Heart disease Brother     murmur  . Hypertension Neg Hx   . Diabetes Neg Hx   . Depression Neg Hx   . Alcohol abuse Neg Hx   . Drug abuse Neg Hx   . Stroke Neg Hx   . Cancer Neg Hx    History  Sexual Activity  . Sexual activity: Yes    Outpatient Encounter Prescriptions as of 02/24/2016  Medication Sig  . aspirin 81 MG tablet Take 81 mg by mouth daily.  . butalbital-acetaminophen-caffeine (FIORICET, ESGIC) 50-325-40 MG tablet Take 1 tablet by mouth every 6 (six) hours as needed.  . carvedilol (COREG) 6.25 MG tablet Take 1 tablet twice a day with a meal  . ezetimibe-simvastatin (VYTORIN) 10-80 MG tablet TAKE 1 TABLET  BY MOUTH AT BEDTIME.  . finasteride (PROSCAR) 5 MG tablet TAKE 1 TABLET BY MOUTH DAILY.  Marland Kitchen Omega-3 Fatty Acids (FISH OIL) 1200 MG CAPS Take 1,200 mg by mouth daily. Reported on 02/23/2015  . tamsulosin (FLOMAX) 0.4 MG CAPS capsule TAKE 1 CAPSULE BY MOUTH AT BEDTIME   No facility-administered encounter medications on file as of 02/24/2016.     Activities of Daily Living In your present state of health, do you have any difficulty performing the following activities: 02/24/2016  Hearing? N  Vision? N  Difficulty concentrating or making decisions? N  Walking or climbing stairs? Y  Dressing or bathing? N  Doing errands, shopping? N  Preparing Food and eating ? N  Using the Toilet? N  Managing your Medications? N  Managing your Finances? N  Housekeeping or managing your Housekeeping? N  Some recent data might be hidden    Patient Care Team: Ria Bush, MD as PCP - General (Family Medicine)   Assessment:     Hearing Screening   125Hz  250Hz  500Hz  1000Hz  2000Hz  3000Hz  4000Hz  6000Hz  8000Hz   Right ear:   40 40 40  40    Left ear:   40 40 40  40      Visual Acuity Screening   Right eye Left eye Both eyes  Without correction: 20/25 20/25 20/25   With correction:       Exercise Activities and Dietary recommendations Current Exercise Habits: Home exercise routine, Type of exercise: walking;Other - see comments (wood chopping), Time (Minutes): 60, Frequency (Times/Week): 3, Weekly Exercise (Minutes/Week): 180, Intensity: Moderate, Exercise limited by: None identified  Goals    . Increase physical activity          Starting 02/24/2016, I will continue to exercise at least 60 min 3 days per week.       Fall Risk Fall Risk  02/24/2016 02/23/2015  Falls in the past year? No No   Depression Screen PHQ 2/9 Scores 02/24/2016 02/23/2015  PHQ - 2 Score 0 0    Cognitive Function MMSE - Mini Mental State Exam 02/24/2016  Orientation to time 5  Orientation to Place 5    Registration 3  Attention/ Calculation 0  Recall 3  Language- name 2 objects 0  Language- repeat 1  Language- follow 3 step command 3  Language- read & follow direction 0  Write a sentence 0  Copy design 0  Total score 20     PLEASE NOTE: A Mini-Cog screen was completed. Maximum score is 20. A value of 0 denotes this part of Folstein MMSE was not  completed or the patient failed this part of the Mini-Cog screening.   Mini-Cog Screening Orientation to Time - Max 5 pts Orientation to Place - Max 5 pts Registration - Max 3 pts Recall - Max 3 pts Language Repeat - Max 1 pts Language Follow 3 Step Command - Max 3 pts     Immunization History  Administered Date(s) Administered  . Influenza,inj,Quad PF,36+ Mos 02/24/2016  . Pneumococcal Conjugate-13 02/23/2015  . Pneumococcal Polysaccharide-23 02/24/2016  . Td 02/25/2003  . Tdap 08/28/2013, 02/04/2015   Screening Tests Health Maintenance  Topic Date Due  . ZOSTAVAX  02/23/2017 (Originally 12/03/2009)  . COLONOSCOPY  07/01/2016  . DTaP/Tdap/Td (2 - Td) 02/03/2025  . TETANUS/TDAP  02/03/2025  . INFLUENZA VACCINE  Completed  . Hepatitis C Screening  Completed  . PNA vac Low Risk Adult  Completed      Plan:     I have personally reviewed and addressed the Medicare Annual Wellness questionnaire and have noted the following in the patient's chart:  A. Medical and social history B. Use of alcohol, tobacco or illicit drugs  C. Current medications and supplements D. Functional ability and status E.  Nutritional status F.  Physical activity G. Advance directives H. List of other physicians I.  Hospitalizations, surgeries, and ER visits in previous 12 months J.  [place] to include hearing, vision, cognitive, depression L. Referrals and appointments - none  In addition, I have reviewed and discussed with patient certain preventive protocols, quality metrics, and best practice recommendations. A written  personalized care plan for preventive services as well as general preventive health recommendations were provided to patient.  See attached scanned questionnaire for additional information.   Signed,   Lindell Noe, MHA, BS, LPN Health Coach

## 2016-02-26 NOTE — Progress Notes (Signed)
I reviewed health advisor's note, was available for consultation, and agree with documentation and plan.  

## 2016-02-29 ENCOUNTER — Ambulatory Visit (INDEPENDENT_AMBULATORY_CARE_PROVIDER_SITE_OTHER)
Admission: RE | Admit: 2016-02-29 | Discharge: 2016-02-29 | Disposition: A | Discharge status: Home / Self Care | Payer: Medicare Other | Source: Ambulatory Visit | Attending: Family Medicine | Admitting: Family Medicine

## 2016-02-29 ENCOUNTER — Encounter: Payer: Self-pay | Admitting: Family Medicine

## 2016-02-29 ENCOUNTER — Ambulatory Visit (INDEPENDENT_AMBULATORY_CARE_PROVIDER_SITE_OTHER): Payer: Medicare Other | Admitting: Family Medicine

## 2016-02-29 VITALS — BP 110/70 | HR 72 | Temp 97.5°F | Wt 233.8 lb

## 2016-02-29 DIAGNOSIS — R0789 Other chest pain: Secondary | ICD-10-CM | POA: Diagnosis not present

## 2016-02-29 DIAGNOSIS — N400 Enlarged prostate without lower urinary tract symptoms: Secondary | ICD-10-CM

## 2016-02-29 DIAGNOSIS — I471 Supraventricular tachycardia: Secondary | ICD-10-CM

## 2016-02-29 DIAGNOSIS — E785 Hyperlipidemia, unspecified: Secondary | ICD-10-CM | POA: Diagnosis not present

## 2016-02-29 DIAGNOSIS — Z7189 Other specified counseling: Secondary | ICD-10-CM

## 2016-02-29 DIAGNOSIS — G43909 Migraine, unspecified, not intractable, without status migrainosus: Secondary | ICD-10-CM

## 2016-02-29 DIAGNOSIS — Z0000 Encounter for general adult medical examination without abnormal findings: Secondary | ICD-10-CM

## 2016-02-29 DIAGNOSIS — D492 Neoplasm of unspecified behavior of bone, soft tissue, and skin: Secondary | ICD-10-CM | POA: Diagnosis not present

## 2016-02-29 DIAGNOSIS — N402 Nodular prostate without lower urinary tract symptoms: Secondary | ICD-10-CM

## 2016-02-29 MED ORDER — CARVEDILOL 6.25 MG PO TABS: ORAL_TABLET | ORAL | 3 refills | Status: DC

## 2016-02-29 MED ORDER — FINASTERIDE 5 MG PO TABS: ORAL_TABLET | ORAL | 3 refills | Status: DC

## 2016-02-29 MED ORDER — BENZONATATE 100 MG PO CAPS: 100.0000 mg | ORAL_CAPSULE | Freq: Three times a day (TID) | ORAL | 0 refills | Status: DC | PRN

## 2016-02-29 MED ORDER — EZETIMIBE-SIMVASTATIN 10-80 MG PO TABS: ORAL_TABLET | ORAL | 3 refills | Status: DC

## 2016-02-29 MED ORDER — TAMSULOSIN HCL 0.4 MG PO CAPS: ORAL_CAPSULE | ORAL | 3 refills | Status: DC

## 2016-02-29 NOTE — Assessment & Plan Note (Signed)
Controlled with rare fioricet use.

## 2016-02-29 NOTE — Assessment & Plan Note (Signed)
Longstanding h/o this. Pt endorses h/o pleurisy, now with nagging cough. Will update CXR, trial tessalon perls. Consider NSAID course if no better with perls.

## 2016-02-29 NOTE — Assessment & Plan Note (Signed)
New, recently started bleeding. Will refer to derm for further eval.

## 2016-02-29 NOTE — Assessment & Plan Note (Deleted)

## 2016-02-29 NOTE — Assessment & Plan Note (Signed)
Again exam benign today. I do not palpate nodule. Also, PSA reassuring. Will remove from problem list.

## 2016-02-29 NOTE — Assessment & Plan Note (Signed)
Chronic, stable. Continue current regimen of flomax/finasteride. PSA and DRE reassuring today.

## 2016-02-29 NOTE — Assessment & Plan Note (Signed)
H/o this, stable on carvedilol 6.25mg  BID.  Continue to monitor.

## 2016-02-29 NOTE — Assessment & Plan Note (Signed)
Preventative protocols reviewed and updated unless pt declined. Discussed healthy diet and lifestyle.  

## 2016-02-29 NOTE — Assessment & Plan Note (Signed)
Chronic, stable. Continue vytorin.

## 2016-02-29 NOTE — Patient Instructions (Addendum)
Try tessalon perls for cough.  Chest xray today Sign release for colonoscopy from [facility] and/or [facility]. Bring me copy of your advanced directive We will refer you to dermatologist to check spot on back.  Return as needed or in 1 year for next physical.  Health Maintenance, Male A healthy lifestyle and preventative care can promote health and wellness.  Maintain regular health, dental, and eye exams.  Eat a healthy diet. Foods like vegetables, fruits, whole grains, low-fat dairy products, and lean protein foods contain the nutrients you need and are low in calories. Decrease your intake of foods high in solid fats, added sugars, and salt. Get information about a proper diet from your health care provider, if necessary.  Regular physical exercise is one of the most important things you can do for your health. Most adults should get at least 150 minutes of moderate-intensity exercise (any activity that increases your heart rate and causes you to sweat) each week. In addition, most adults need muscle-strengthening exercises on 2 or more days a week.   Maintain a healthy weight. The body mass index (BMI) is a screening tool to identify possible weight problems. It provides an estimate of body fat based on height and weight. Your health care provider can find your BMI and can help you achieve or maintain a healthy weight. For males 20 years and older:  A BMI below 18.5 is considered underweight.  A BMI of 18.5 to 24.9 is normal.  A BMI of 25 to 29.9 is considered overweight.  A BMI of 30 and above is considered obese.  Maintain normal blood lipids and cholesterol by exercising and minimizing your intake of saturated fat. Eat a balanced diet with plenty of fruits and vegetables. Blood tests for lipids and cholesterol should begin at age 31 and be repeated every 5 years. If your lipid or cholesterol levels are high, you are over age 68, or you are at high risk for heart disease, you may need your  cholesterol levels checked more frequently.Ongoing high lipid and cholesterol levels should be treated with medicines if diet and exercise are not working.  If you smoke, find out from your health care provider how to quit. If you do not use tobacco, do not start.  Lung cancer screening is recommended for adults aged 10-80 years who are at high risk for developing lung cancer because of a history of smoking. A yearly low-dose CT scan of the lungs is recommended for people who have at least a 30-pack-year history of smoking and are current smokers or have quit within the past 15 years. A pack year of smoking is smoking an average of 1 pack of cigarettes a day for 1 year (for example, a 30-pack-year history of smoking could mean smoking 1 pack a day for 30 years or 2 packs a day for 15 years). Yearly screening should continue until the smoker has stopped smoking for at least 15 years. Yearly screening should be stopped for people who develop a health problem that would prevent them from having lung cancer treatment.  If you choose to drink alcohol, do not have more than 2 drinks per day. One drink is considered to be 12 oz (360 mL) of beer, 5 oz (150 mL) of wine, or 1.5 oz (45 mL) of liquor.  Avoid the use of street drugs. Do not share needles with anyone. Ask for help if you need support or instructions about stopping the use of drugs.  High blood pressure causes  heart disease and increases the risk of stroke. High blood pressure is more likely to develop in:  People who have blood pressure in the end of the normal range (100-139/85-89 mm Hg).  People who are overweight or obese.  People who are African American.  If you are 64-44 years of age, have your blood pressure checked every 3-5 years. If you are 25 years of age or older, have your blood pressure checked every year. You should have your blood pressure measured twice-once when you are at a hospital or clinic, and once when you are not at a  hospital or clinic. Record the average of the two measurements. To check your blood pressure when you are not at a hospital or clinic, you can use:  An automated blood pressure machine at a pharmacy.  A home blood pressure monitor.  If you are 52-33 years old, ask your health care provider if you should take aspirin to prevent heart disease.  Diabetes screening involves taking a blood sample to check your fasting blood sugar level. This should be done once every 3 years after age 19 if you are at a normal weight and without risk factors for diabetes. Testing should be considered at a younger age or be carried out more frequently if you are overweight and have at least 1 risk factor for diabetes.  Colorectal cancer can be detected and often prevented. Most routine colorectal cancer screening begins at the age of 27 and continues through age 52. However, your health care provider may recommend screening at an earlier age if you have risk factors for colon cancer. On a yearly basis, your health care provider may provide home test kits to check for hidden blood in the stool. A small camera at the end of a tube may be used to directly examine the colon (sigmoidoscopy or colonoscopy) to detect the earliest forms of colorectal cancer. Talk to your health care provider about this at age 37 when routine screening begins. A direct exam of the colon should be repeated every 5-10 years through age 26, unless early forms of precancerous polyps or small growths are found.  People who are at an increased risk for hepatitis B should be screened for this virus. You are considered at high risk for hepatitis B if:  You were born in a country where hepatitis B occurs often. Talk with your health care provider about which countries are considered high risk.  Your parents were born in a high-risk country and you have not received a shot to protect against hepatitis B (hepatitis B vaccine).  You have HIV or AIDS.  You  use needles to inject street drugs.  You live with, or have sex with, someone who has hepatitis B.  You are a man who has sex with other men (MSM).  You get hemodialysis treatment.  You take certain medicines for conditions like cancer, organ transplantation, and autoimmune conditions.  Hepatitis C blood testing is recommended for all people born from [birth year] through 1966 and any individual with known risk factors for hepatitis C.  Healthy men should no longer receive prostate-specific antigen (PSA) blood tests as part of routine cancer screening. Talk to your health care provider about prostate cancer screening.  Testicular cancer screening is not recommended for adolescents or adult males who have no symptoms. Screening includes self-exam, a health care provider exam, and other screening tests. Consult with your health care provider about any symptoms you have or any concerns you have about  testicular cancer.  Practice safe sex. Use condoms and avoid high-risk sexual practices to reduce the spread of sexually transmitted infections (STIs).  You should be screened for STIs, including gonorrhea and chlamydia if:  You are sexually active and are younger than 24 years.  You are older than 24 years, and your health care provider tells you that you are at risk for this type of infection.  Your sexual activity has changed since you were last screened, and you are at an increased risk for chlamydia or gonorrhea. Ask your health care provider if you are at risk.  If you are at risk of being infected with HIV, it is recommended that you take a prescription medicine daily to prevent HIV infection. This is called pre-exposure prophylaxis (PrEP). You are considered at risk if:  You are a man who has sex with other men (MSM).  You are a heterosexual man who is sexually active with multiple partners.  You take drugs by injection.  You are sexually active with a partner who has HIV.  Talk with  your health care provider about whether you are at high risk of being infected with HIV. If you choose to begin PrEP, you should first be tested for HIV. You should then be tested every 3 months for as long as you are taking PrEP.  Use sunscreen. Apply sunscreen liberally and repeatedly throughout the day. You should seek shade when your shadow is shorter than you. Protect yourself by wearing long sleeves, pants, a wide-brimmed hat, and sunglasses year round whenever you are outdoors.  Tell your health care provider of new moles or changes in moles, especially if there is a change in shape or color. Also, tell your health care provider if a mole is larger than the size of a pencil eraser.  A one-time screening for abdominal aortic aneurysm (AAA) and surgical repair of large AAAs by ultrasound is recommended for men aged 87-75 years who are current or former smokers.  Stay current with your vaccines (immunizations). This information is not intended to replace advice given to you by your health care provider. Make sure you discuss any questions you have with your health care provider. Document Released: 08/19/2007 Document Revised: 03/13/2014 Document Reviewed: 11/24/2014 Elsevier Interactive Patient Education  2017 [employer].

## 2016-02-29 NOTE — Assessment & Plan Note (Signed)
Advanced directive discussion - worked on this with Chief Executive Officer. Thinks has at home. Wife Hoyle Sauer would be HCPOA. Will review at home and bring me copy. Advanced directive packet provided today

## 2016-02-29 NOTE — Progress Notes (Addendum)
BP 110/70   Pulse 72   Temp 97.5 F (36.4 C) (Oral)   Wt 233 lb 12 oz (106 kg)   SpO2 97%   BMI 33.30 kg/m    CC: CPE Subjective:    Patient ID: Alec Maldonado, male    DOB: 02/22/1951, 65 y.o.   MRN: [id number]  HPI: Alec Maldonado is a 65 y.o. male presenting on 02/29/2016 for Annual Exam and Cough (x2 weeks)   Saw Katha Cabal last week for medicare wellness visit, note reviewed.   Ongoing longstanding intermittent pleurisy "stabbing pain" no pressure/tightness. Noticing some dyspnea as well as dry cough. No fevers. Not activity limiting, continues cutting wood and walking regularly. No h/o smoking, COPD, asthma.   Known BPH and prior slight firmness to R lobe ?nodule - improved on recheck 08/2015. Due for recheck today. On flomax/finasteride.   Preventative: Colonoscopy 2008 in [place], states normal for 10 yrs thinks due ~4/21018. Will request records today and likely refer.  Prostate screen - yearly  Flu - yearly Tdap 2015 again 02/2015 Pneumovax 2017, prevnar 2016 Shingles shot - declines Advanced directive discussion - worked on this with Chief Executive Officer. Thinks has at home. Wife Hoyle Sauer would be HCPOA. Will review at home and bring me copy. Advanced directive packet provided today Seat belt use discussed Sunscreen use discussed. Mole on lower back present for years, started bleeding over the last 3-4 months. Requests referral to Dr Wilhemina Bonito in [place] if needed. 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 Interim medical history since our last visit reviewed. Allergies and medications reviewed and updated. Current Outpatient Prescriptions on File Prior to  Visit  Medication Sig  . aspirin 81 MG tablet Take 81 mg by mouth daily.  . butalbital-acetaminophen-caffeine (FIORICET, ESGIC) 50-325-40 MG tablet Take 1 tablet by mouth every 6 (six) hours as needed.  . Omega-3 Fatty Acids (FISH OIL) 1200 MG CAPS Take 1,200 mg by mouth daily. Reported on 02/23/2015   No current facility-administered medications on file prior to visit.     Review of Systems  Constitutional: Negative for activity change, appetite change, chills, fatigue, fever and unexpected weight change.  HENT: Negative for hearing loss.   Eyes: Negative for visual disturbance.  Respiratory: Positive for cough, chest tightness and shortness of breath. Negative for wheezing.        Endorses h/o pleurisy  Cardiovascular: Negative for chest pain, palpitations and leg swelling.  Gastrointestinal: Negative for abdominal distention, abdominal pain, blood in stool, constipation, diarrhea, nausea and vomiting.  Genitourinary: Negative for difficulty urinating and hematuria.  Musculoskeletal: Negative for arthralgias, myalgias and neck pain.  Skin: Negative for rash.  Neurological: Negative for dizziness, seizures, syncope and headaches.  Hematological: Negative for adenopathy. Does not bruise/bleed easily.  Psychiatric/Behavioral: Negative for dysphoric mood. The patient is not nervous/anxious.    Per HPI unless specifically indicated in ROS section     Objective:    BP 110/70   Pulse 72   Temp 97.5 F (36.4 C) (Oral)   Wt 233 lb 12 oz (106 kg)   SpO2 97%   BMI 33.30 kg/m   Wt Readings from Last 3 Encounters:  02/29/16 233 lb 12 oz (106 kg)  02/24/16 228 lb 8 oz (103.6 kg)  08/17/15 226 lb 4 oz (102.6 kg)    Physical Exam  Constitutional: He is oriented to person, place, and time. He appears well-developed and well-nourished. No distress.  HENT:  Head: Normocephalic and atraumatic.  Right Ear: Hearing, tympanic membrane, external ear and ear canal normal.  Left Ear: Hearing,  tympanic membrane, external ear and ear canal normal.  Nose: Nose normal.  Mouth/Throat: Uvula is midline, oropharynx is clear and moist and mucous membranes are normal. No oropharyngeal exudate, posterior oropharyngeal edema or posterior oropharyngeal erythema.  Eyes: Conjunctivae and EOM are normal. Pupils are equal, round, and reactive to light. No scleral icterus.  Neck: Normal range of motion. Neck supple. Carotid bruit is not present. No thyromegaly present.  Cardiovascular: Normal rate, regular rhythm, normal heart sounds and intact distal pulses.   No murmur heard. Pulses:      Radial pulses are 2+ on the right side, and 2+ on the left side.  Pulmonary/Chest: Effort normal and breath sounds normal. No respiratory distress. He has no wheezes. He has no rales.  Nagging cough present worse with deep breath Lungs overall clear  Abdominal: Soft. Bowel sounds are normal. He exhibits no distension and no mass. There is no tenderness. There is no rebound and no guarding.  Genitourinary: Rectum normal and prostate normal. Rectal exam shows no external hemorrhoid, no internal hemorrhoid, no fissure, no mass, no tenderness and anal tone normal. Prostate is not enlarged (20gm) and not tender.  Musculoskeletal: Normal range of motion. He exhibits no edema.  Lymphadenopathy:    He has no cervical adenopathy.  Neurological: He is alert and oriented to person, place, and time.  CN grossly intact, station and gait intact  Skin: Skin is warm and dry. Lesion noted. No rash noted.  ~1.5cm fleshy pink growth right lower back with scab present  Psychiatric: He has a normal mood and affect. His behavior is normal. Judgment and thought content normal.  Nursing note and vitals reviewed.  Results for orders placed or performed in visit on 02/24/16  Lipid panel  Result Value Ref Range   Cholesterol 125 0 - 200 mg/dL   Triglycerides 141.0 0.0 - 149.0 mg/dL   HDL 31.40 (L) >39.00 mg/dL   VLDL 28.2 0.0 -  40.0 mg/dL   LDL Cholesterol 66 0 - 99 mg/dL   Total CHOL/HDL Ratio 4    NonHDL [id number]   Basic metabolic panel  Result Value Ref Range   Sodium 141 135 - 145 mEq/L   Potassium 4.4 3.5 - 5.1 mEq/L   Chloride 107 96 - 112 mEq/L   CO2 28 19 - 32 mEq/L   Glucose, Bld 91 70 - 99 mg/dL   BUN 17 6 - 23 mg/dL   Creatinine, Ser 1.09 0.40 - 1.50 mg/dL   Calcium 9.3 8.4 - 10.5 mg/dL   GFR 71.89 >60.00 mL/min  PSA  Result Value Ref Range   PSA 0.66 0.10 - 4.00 ng/mL  Hepatic function panel  Result Value Ref Range   Total Bilirubin 0.6 0.2 - 1.2 mg/dL   Bilirubin, Direct 0.1 0.0 - 0.3 mg/dL   Alkaline Phosphatase 61 39 - 117 U/L   AST 19 0 - 37 U/L   ALT 26 0 - 53 U/L   Total Protein 6.8 6.0 - 8.3 g/dL   Albumin 4.4 3.5 - 5.2 g/dL      Assessment & Plan:   Problem List Items Addressed This Visit  Advanced care planning/counseling discussion    Advanced directive discussion - worked on this with lawyer. Thinks has at home. Wife Hoyle Sauer would be HCPOA. Will review at home and bring me copy. Advanced directive packet provided today      Benign prostatic hyperplasia    Chronic, stable. Continue current regimen of flomax/finasteride. PSA and DRE reassuring today.       Relevant Medications   finasteride (PROSCAR) 5 MG tablet   tamsulosin (FLOMAX) 0.4 MG CAPS capsule   Chest discomfort    Longstanding h/o this. Pt endorses h/o pleurisy, now with nagging cough. Will update CXR, trial tessalon perls. Consider NSAID course if no better with perls.       Relevant Orders   DG Chest 2 View   Dyslipidemia    Chronic, stable. Continue vytorin.       Relevant Medications   ezetimibe-simvastatin (VYTORIN) 10-80 MG tablet   Health maintenance examination - Primary    Preventative protocols reviewed and updated unless pt declined. Discussed healthy diet and lifestyle.       Migraines    Controlled with rare fioricet use.       Relevant Medications   carvedilol (COREG) 6.25 MG  tablet   ezetimibe-simvastatin (VYTORIN) 10-80 MG tablet   RESOLVED: Prostate nodule    Again exam benign today. I do not palpate nodule. Also, PSA reassuring. Will remove from problem list.       Skin growth    New, recently started bleeding. Will refer to derm for further eval.       Relevant Orders   Ambulatory referral to Dermatology   SVT (supraventricular tachycardia) ([place])    H/o this, stable on carvedilol 6.25mg  BID.  Continue to monitor.       Relevant Medications   carvedilol (COREG) 6.25 MG tablet   ezetimibe-simvastatin (VYTORIN) 10-80 MG tablet       Follow up plan: Return in about 1 year (around 02/28/2017) for annual exam, prior fasting for blood work.  Ria Bush, MD

## 2016-02-29 NOTE — Progress Notes (Signed)
Pre visit review using our clinic review tool, if applicable. No additional management support is needed unless otherwise documented below in the visit note. 

## 2016-03-03 ENCOUNTER — Encounter: Payer: Medicare Other | Admitting: Family Medicine

## 2016-03-07 ENCOUNTER — Telehealth: Payer: Self-pay | Admitting: Family Medicine

## 2016-03-07 ENCOUNTER — Encounter: Payer: Self-pay | Admitting: Family Medicine

## 2016-03-07 MED ORDER — GUAIFENESIN-CODEINE 100-10 MG/5ML PO SYRP: 5.0000 mL | ORAL_SOLUTION | Freq: Two times a day (BID) | ORAL | 0 refills | Status: DC | PRN

## 2016-03-07 NOTE — Telephone Encounter (Signed)
plz phone in cheratussin cough syrup. Also recommend trial alleve BID with food for 1 week.  Update if not improving with this.

## 2016-03-07 NOTE — Telephone Encounter (Signed)
Pt called because he is still coughing.  He said the pills Dr. Darnell Level prescribed didn't help at all.  He has been coughing and is looking for relief.  He would like something called into the pharmacy.  He can be reached at [phone].

## 2016-03-08 ENCOUNTER — Telehealth: Payer: Self-pay | Admitting: Family Medicine

## 2016-03-08 NOTE — Telephone Encounter (Signed)
Rx called in as directed and patient's wife notified.

## 2016-03-08 NOTE — Telephone Encounter (Signed)
Already handled.

## 2016-03-08 NOTE — Telephone Encounter (Signed)
Patient Name: Severin Kyllonen  DOB: 1950/04/19    Initial Comment Caller states was seen 26th, not better; severe cough 3 wks; yellow phlegm; wants meds for cough and congestion;    Nurse Assessment  Nurse: Raphael Gibney, RN, Vanita Ingles Date/Time (Eastern Time): 03/08/2016 9:22:38 AM  Confirm and document reason for call. If symptomatic, describe symptoms. ---Caller states he has been coughing for 3 weeks. Has pain in his sides and back. coughing up yellow sputum. Fever at times. He was seen on Dec 26 th. No medication was ordered.  Does the patient have any new or worsening symptoms? ---Yes  Will a triage be completed? ---Yes  Related visit to physician within the last 2 weeks? ---Yes  Does the PT have any chronic conditions? (i.e. diabetes, asthma, etc.) ---No  Is this a behavioral health or substance abuse call? ---No     Guidelines    Guideline Title Affirmed Question Affirmed Notes  Cough - Acute Productive SEVERE coughing spells (e.g., whooping sound after coughing, vomiting after coughing)    Final Disposition User   See Physician within 24 Hours Stringer, RN, Vanita Ingles    Comments  Pt does not want to come in for appt. He wants some Tussinex called in for his cough. Please call pt back regarding Tussinex. Says you can speak to his wife.   Referrals  GO TO FACILITY REFUSED   Disagree/Comply: Disagree  Disagree/Comply Reason: Disagree with instructions

## 2016-03-08 NOTE — Telephone Encounter (Signed)
See phone note from 03/07/16.

## 2016-03-14 ENCOUNTER — Telehealth: Payer: Self-pay

## 2016-03-14 ENCOUNTER — Encounter: Payer: Self-pay | Admitting: Family Medicine

## 2016-03-14 ENCOUNTER — Ambulatory Visit (INDEPENDENT_AMBULATORY_CARE_PROVIDER_SITE_OTHER)
Admission: RE | Admit: 2016-03-14 | Discharge: 2016-03-14 | Disposition: A | Discharge status: Home / Self Care | Payer: Medicare Other | Source: Ambulatory Visit | Attending: Family Medicine | Admitting: Family Medicine

## 2016-03-14 ENCOUNTER — Ambulatory Visit (INDEPENDENT_AMBULATORY_CARE_PROVIDER_SITE_OTHER): Payer: Medicare Other | Admitting: Family Medicine

## 2016-03-14 VITALS — BP 106/76 | HR 76 | Temp 97.5°F | Wt 231.5 lb

## 2016-03-14 DIAGNOSIS — R05 Cough: Secondary | ICD-10-CM | POA: Insufficient documentation

## 2016-03-14 DIAGNOSIS — R059 Cough, unspecified: Secondary | ICD-10-CM | POA: Insufficient documentation

## 2016-03-14 DIAGNOSIS — R051 Acute cough: Secondary | ICD-10-CM | POA: Insufficient documentation

## 2016-03-14 MED ORDER — DOXYCYCLINE HYCLATE 100 MG PO TABS: 100.0000 mg | ORAL_TABLET | Freq: Two times a day (BID) | ORAL | 0 refills | Status: DC

## 2016-03-14 MED ORDER — HYDROCOD POLST-CPM POLST ER 10-8 MG/5ML PO SUER: 5.0000 mL | Freq: Two times a day (BID) | ORAL | 0 refills | Status: DC | PRN

## 2016-03-14 NOTE — Progress Notes (Signed)
Pre visit review using our clinic review tool, if applicable. No additional management support is needed unless otherwise documented below in the visit note. 

## 2016-03-14 NOTE — Patient Instructions (Signed)
Start doxycycline.  Use the cough medicine as needed.  Sedation caution.  Rest and fluids in the meantime.  Go to the lab on the way out.  We'll contact you with your xray report. If you have pneumonia, we'll add on augmentin.  Take care.  Glad to see you.  Update [place] if worse.

## 2016-03-14 NOTE — Assessment & Plan Note (Signed)
Would start doxy and hydrocodone cough syrup.  Would have added augmentin if PNA on cxr but no PNA so continue as is.  Had d/w pt about contingencies at the [facility].  He understood.  Okay for outpatient f/u.  Nontoxic.

## 2016-03-14 NOTE — Progress Notes (Signed)
H/o pleurisy, mild sx recently, "I just ignore it."   Cough worse in the last few weeks.  Sleep disrupted.  Some discolored sputum, started ~3 weeks ago.  No fevers.  No vomiting, no diarrhea, no rash.  Occ ear pain, occ nasal congestion.  Mild scratchy throat from coughing.  Some occ wheeze.  No h/o COPD.  No h/o smoking.    He can tolerate hydrocodone.  D/w pt.   Meds, vitals, and allergies reviewed.   ROS: Per HPI unless specifically indicated in ROS section   GEN: nad, alert and oriented HEENT: mucous membranes moist, tm w/o erythema, nasal exam w/o erythema, clear discharge noted,  OP with cobblestoning NECK: supple w/o LA CV: rrr.   PULM: ctab except for faint crackles episodically on the RUL but they clear with a cough, recheck ctab, no inc wob, no wheeze.   EXT: no edema

## 2016-03-14 NOTE — Telephone Encounter (Signed)
Pt was seen earlier today and wants to know why cough med was not called in to pharmacy; advised pt cannot call in Tussionex and asked pt if he had his paperwork from visit earlier today. Pt found rx and will take to pharmacy. Nothing further needed.

## 2016-03-19 ENCOUNTER — Other Ambulatory Visit: Payer: Self-pay | Admitting: Family Medicine

## 2016-03-22 ENCOUNTER — Other Ambulatory Visit: Payer: Self-pay | Admitting: Family Medicine

## 2016-03-24 ENCOUNTER — Other Ambulatory Visit: Payer: Self-pay | Admitting: Family Medicine

## 2016-04-10 ENCOUNTER — Encounter: Payer: Self-pay | Admitting: Gastroenterology

## 2016-04-17 ENCOUNTER — Encounter: Payer: Self-pay | Admitting: Gastroenterology

## 2016-06-04 HISTORY — PX: COLONOSCOPY: SHX174

## 2016-06-14 ENCOUNTER — Ambulatory Visit (AMBULATORY_SURGERY_CENTER): Payer: Self-pay

## 2016-06-14 ENCOUNTER — Encounter: Payer: Self-pay | Admitting: Gastroenterology

## 2016-06-14 VITALS — Ht 72.0 in | Wt 240.0 lb

## 2016-06-14 DIAGNOSIS — Z1211 Encounter for screening for malignant neoplasm of colon: Secondary | ICD-10-CM

## 2016-06-14 MED ORDER — NA SULFATE-K SULFATE-MG SULF 17.5-3.13-1.6 GM/177ML PO SOLN: 1.0000 | Freq: Once | ORAL | 0 refills | Status: AC

## 2016-06-14 NOTE — Progress Notes (Signed)
Denies allergies to eggs or soy products. Denies complication of anesthesia or sedation. Denies use of weight loss medication. Denies use of O2.   Emmi instructions given for colonoscopy.  

## 2016-06-21 ENCOUNTER — Telehealth: Payer: Self-pay

## 2016-06-22 NOTE — Telephone Encounter (Signed)
Instructions for Miralax prep called to wife, printed and mailed. Darel Ricketts/PV

## 2016-06-28 ENCOUNTER — Encounter: Payer: Self-pay | Admitting: Gastroenterology

## 2016-06-28 ENCOUNTER — Ambulatory Visit (AMBULATORY_SURGERY_CENTER): Payer: Medicare Other | Admitting: Gastroenterology

## 2016-06-28 VITALS — BP 132/85 | HR 66 | Temp 96.8°F | Resp 9 | Ht 72.0 in | Wt 240.0 lb

## 2016-06-28 DIAGNOSIS — K5730 Diverticulosis of large intestine without perforation or abscess without bleeding: Secondary | ICD-10-CM

## 2016-06-28 DIAGNOSIS — Z1211 Encounter for screening for malignant neoplasm of colon: Secondary | ICD-10-CM | POA: Diagnosis not present

## 2016-06-28 DIAGNOSIS — Z1212 Encounter for screening for malignant neoplasm of rectum: Secondary | ICD-10-CM | POA: Diagnosis not present

## 2016-06-28 MED ORDER — SODIUM CHLORIDE 0.9 % IV SOLN: 500.0000 mL | INTRAVENOUS | Status: DC

## 2016-06-28 NOTE — Progress Notes (Signed)
Pt's states no medical or surgical changes since previsit or office visit. 

## 2016-06-28 NOTE — Op Note (Signed)
[facility] Patient Name: Alec Maldonado Procedure Date: 06/28/2016 10:19 AM MRN: [id number] Endoscopist: Milus Banister , MD Age: 66 Referring MD:  Date of Birth: 10-Jan-1951 Gender: Male Account #: [id number] Procedure:                Colonoscopy Indications:              Screening for colorectal malignant neoplasm;                            colonoscopy 2008 found no polyps Medicines:                Monitored Anesthesia Care Procedure:                Pre-Anesthesia Assessment:                           - Prior to the procedure, a History and Physical                            was performed, and patient medications and                            allergies were reviewed. The patient's tolerance of                            previous anesthesia was also reviewed. The risks                            and benefits of the procedure and the sedation                            options and risks were discussed with the patient.                            All questions were answered, and informed consent                            was obtained. Prior Anticoagulants: The patient has                            taken no previous anticoagulant or antiplatelet                            agents. ASA Grade Assessment: II - A patient with                            mild systemic disease. After reviewing the risks                            and benefits, the patient was deemed in                            satisfactory condition to undergo the procedure.  After obtaining informed consent, the colonoscope                            was passed under direct vision. Throughout the                            procedure, the patient's blood pressure, pulse, and                            oxygen saturations were monitored continuously. The                            Colonoscope was introduced through the anus and                            advanced to the the cecum, identified  by                            appendiceal orifice and ileocecal valve. The                            colonoscopy was performed without difficulty. The                            patient tolerated the procedure well. The quality                            of the bowel preparation was excellent. The                            ileocecal valve, appendiceal orifice, and rectum                            were photographed. Scope In: 10:31:11 AM Scope Out: 10:39:52 AM Scope Withdrawal Time: 0 hours 6 minutes 39 seconds  Total Procedure Duration: 0 hours 8 minutes 41 seconds  Findings:                 A few small and large-mouthed diverticula were                            found in the left colon.                           The exam was otherwise without abnormality on                            direct and retroflexion views. Complications:            No immediate complications. Estimated blood loss:                            None. Estimated Blood Loss:     Estimated blood loss: none. Impression:               - Diverticulosis in the left colon.                           -  The examination was otherwise normal on direct                            and retroflexion views.                           - No specimens collected. Recommendation:           - Patient has a contact number available for                            emergencies. The signs and symptoms of potential                            delayed complications were discussed with the                            patient. Return to normal activities tomorrow.                            Written discharge instructions were provided to the                            patient.                           - Resume previous diet.                           - Continue present medications.                           - Repeat colonoscopy in 10 years for screening                            purposes. Milus Banister, MD 06/28/2016 12:27:49 PM This report  has been signed electronically.

## 2016-06-28 NOTE — Progress Notes (Signed)
To recovery, report to Oliver, RN, VSS 

## 2016-06-28 NOTE — Patient Instructions (Signed)

## 2016-06-29 ENCOUNTER — Telehealth: Payer: Self-pay | Admitting: *Deleted

## 2016-06-29 ENCOUNTER — Encounter: Payer: Self-pay | Admitting: Family Medicine

## 2016-06-29 NOTE — Telephone Encounter (Signed)
  Follow up Call-  Call back number 06/28/2016  Post procedure Call Back phone  # [phone]  Permission to leave phone message Yes  Some recent data might be hidden     Patient questions:  Do you have a fever, pain , or abdominal swelling? No. Pain Score  0 *  Have you tolerated food without any problems? Yes.    Have you been able to return to your normal activities? Yes.    Do you have any questions about your discharge instructions: Diet   No. Medications  No. Follow up visit  No.  Do you have questions or concerns about your Care? No.  Actions: * If pain score is 4 or above: No action needed, pain <4.

## 2016-07-06 ENCOUNTER — Encounter: Payer: Self-pay | Admitting: Family Medicine

## 2017-02-25 ENCOUNTER — Other Ambulatory Visit: Payer: Self-pay | Admitting: Family Medicine

## 2017-03-11 ENCOUNTER — Other Ambulatory Visit: Payer: Self-pay | Admitting: Family Medicine

## 2017-03-11 DIAGNOSIS — E785 Hyperlipidemia, unspecified: Secondary | ICD-10-CM

## 2017-03-11 DIAGNOSIS — N400 Enlarged prostate without lower urinary tract symptoms: Secondary | ICD-10-CM

## 2017-03-11 DIAGNOSIS — R0609 Other forms of dyspnea: Principal | ICD-10-CM

## 2017-03-12 ENCOUNTER — Other Ambulatory Visit (INDEPENDENT_AMBULATORY_CARE_PROVIDER_SITE_OTHER): Payer: Medicare Other

## 2017-03-12 DIAGNOSIS — R0609 Other forms of dyspnea: Secondary | ICD-10-CM | POA: Diagnosis not present

## 2017-03-12 DIAGNOSIS — N400 Enlarged prostate without lower urinary tract symptoms: Secondary | ICD-10-CM | POA: Diagnosis not present

## 2017-03-12 DIAGNOSIS — E785 Hyperlipidemia, unspecified: Secondary | ICD-10-CM | POA: Diagnosis not present

## 2017-03-12 LAB — CBC WITH DIFFERENTIAL/PLATELET
Basophils Absolute: 0 10*3/uL (ref 0.0–0.1)
Basophils Relative: 0.6 % (ref 0.0–3.0)
EOS ABS: 0.2 10*3/uL (ref 0.0–0.7)
Eosinophils Relative: 2.4 % (ref 0.0–5.0)
HCT: 42.3 % (ref 39.0–52.0)
HEMOGLOBIN: 13.8 g/dL (ref 13.0–17.0)
LYMPHS ABS: 2.1 10*3/uL (ref 0.7–4.0)
Lymphocytes Relative: 26.9 % (ref 12.0–46.0)
MCHC: 32.7 g/dL (ref 30.0–36.0)
MCV: 82.6 fl (ref 78.0–100.0)
MONO ABS: 0.8 10*3/uL (ref 0.1–1.0)
Monocytes Relative: 9.8 % (ref 3.0–12.0)
NEUTROS PCT: 60.3 % (ref 43.0–77.0)
Neutro Abs: 4.7 10*3/uL (ref 1.4–7.7)
PLATELETS: 299 10*3/uL (ref 150.0–400.0)
RBC: 5.13 Mil/uL (ref 4.22–5.81)
RDW: 14.5 % (ref 11.5–15.5)
WBC: 7.8 10*3/uL (ref 4.0–10.5)

## 2017-03-12 LAB — LIPID PANEL
CHOL/HDL RATIO: 5
CHOLESTEROL: 146 mg/dL (ref 0–200)
HDL: 28 mg/dL — ABNORMAL LOW (ref >39.00)
LDL CALC: 84 mg/dL (ref 0–99)
NONHDL: 118
Triglycerides: 169 mg/dL — ABNORMAL HIGH (ref 0.0–149.0)
VLDL: 33.8 mg/dL (ref 0.0–40.0)

## 2017-03-12 LAB — COMPREHENSIVE METABOLIC PANEL
ALBUMIN: 4.3 g/dL (ref 3.5–5.2)
ALT: 18 U/L (ref 0–53)
AST: 15 U/L (ref 0–37)
Alkaline Phosphatase: 64 U/L (ref 39–117)
BUN: 17 mg/dL (ref 6–23)
CHLORIDE: 104 meq/L (ref 96–112)
CO2: 28 meq/L (ref 19–32)
CREATININE: 1.17 mg/dL (ref 0.40–1.50)
Calcium: 9.3 mg/dL (ref 8.4–10.5)
GFR: 66.04 mL/min (ref >60.00)
GLUCOSE: 106 mg/dL — AB (ref 70–99)
POTASSIUM: 4.5 meq/L (ref 3.5–5.1)
SODIUM: 140 meq/L (ref 135–145)
Total Bilirubin: 0.6 mg/dL (ref 0.2–1.2)
Total Protein: 6.7 g/dL (ref 6.0–8.3)

## 2017-03-12 LAB — PSA: PSA: 0.71 ng/mL (ref 0.10–4.00)

## 2017-03-13 ENCOUNTER — Ambulatory Visit (INDEPENDENT_AMBULATORY_CARE_PROVIDER_SITE_OTHER): Payer: Medicare Other | Admitting: Family Medicine

## 2017-03-13 ENCOUNTER — Encounter: Payer: Self-pay | Admitting: Family Medicine

## 2017-03-13 VITALS — BP 122/84 | HR 70 | Temp 98.0°F | Ht 70.5 in | Wt 224.8 lb

## 2017-03-13 DIAGNOSIS — Z9889 Other specified postprocedural states: Secondary | ICD-10-CM

## 2017-03-13 DIAGNOSIS — I471 Supraventricular tachycardia: Secondary | ICD-10-CM

## 2017-03-13 DIAGNOSIS — Z0000 Encounter for general adult medical examination without abnormal findings: Secondary | ICD-10-CM | POA: Diagnosis not present

## 2017-03-13 DIAGNOSIS — G43909 Migraine, unspecified, not intractable, without status migrainosus: Secondary | ICD-10-CM

## 2017-03-13 DIAGNOSIS — Z7189 Other specified counseling: Secondary | ICD-10-CM

## 2017-03-13 DIAGNOSIS — N400 Enlarged prostate without lower urinary tract symptoms: Secondary | ICD-10-CM

## 2017-03-13 DIAGNOSIS — E785 Hyperlipidemia, unspecified: Secondary | ICD-10-CM

## 2017-03-13 MED ORDER — TAMSULOSIN HCL 0.4 MG PO CAPS: ORAL_CAPSULE | ORAL | 3 refills | Status: DC

## 2017-03-13 MED ORDER — CARVEDILOL 6.25 MG PO TABS: ORAL_TABLET | ORAL | 0 refills | Status: DC

## 2017-03-13 MED ORDER — BUTALBITAL-APAP-CAFFEINE 50-325-40 MG PO TABS: 1.0000 | ORAL_TABLET | Freq: Four times a day (QID) | ORAL | 1 refills | Status: DC | PRN

## 2017-03-13 MED ORDER — EZETIMIBE-SIMVASTATIN 10-80 MG PO TABS: ORAL_TABLET | ORAL | 3 refills | Status: DC

## 2017-03-13 MED ORDER — FINASTERIDE 5 MG PO TABS: ORAL_TABLET | ORAL | 3 refills | Status: DC

## 2017-03-13 NOTE — Assessment & Plan Note (Signed)
Chronic, stable. Forgot DRE today - will check next year. PSA stable.

## 2017-03-13 NOTE — Assessment & Plan Note (Addendum)
Advanced directive discussion - worked on this with Chief Executive Officer. Thinks has at home. Wife Alec Maldonado would be HCPOA. Will review at home and bring me copy.

## 2017-03-13 NOTE — Assessment & Plan Note (Signed)
Chronic, stable on vytorin. Continue. The 10-year ASCVD risk score Mikey Bussing DC Brooke Bonito., et al., 2013) is: 15.1%   Values used to calculate the score:     Age: 67 years     Sex: Male     Is Non-Hispanic African American: No     Diabetic: No     Tobacco smoker: No     Systolic Blood Pressure: 127 mmHg     Is BP treated: No     HDL Cholesterol: 28 mg/dL     Total Cholesterol: 146 mg/dL

## 2017-03-13 NOTE — Patient Instructions (Addendum)
If interested, check with pharmacy about new 2 shot shingles series (shingrix).  Bring me copy of your living will. Return in 1 year for next medicare wellness visit with Katha Cabal and physical with me.   Health Maintenance, Male A healthy lifestyle and preventive care is important for your health and wellness. Ask your health care provider about what schedule of regular examinations is right for you. What should I know about weight and diet? Eat a Healthy Diet  Eat plenty of vegetables, fruits, whole grains, low-fat dairy products, and lean protein.  Do not eat a lot of foods high in solid fats, added sugars, or salt.  Maintain a Healthy Weight Regular exercise can help you achieve or maintain a healthy weight. You should:  Do at least 150 minutes of exercise each week. The exercise should increase your heart rate and make you sweat (moderate-intensity exercise).  Do strength-training exercises at least twice a week.  Watch Your Levels of Cholesterol and Blood Lipids  Have your blood tested for lipids and cholesterol every 5 years starting at 68 years of age. If you are at high risk for heart disease, you should start having your blood tested when you are 68 years old. You may need to have your cholesterol levels checked more often if: ? Your lipid or cholesterol levels are high. ? You are older than 68 years of age. ? You are at high risk for heart disease.  What should I know about cancer screening? Many types of cancers can be detected early and may often be prevented. Lung Cancer  You should be screened every year for lung cancer if: ? You are a current smoker who has smoked for at least 30 years. ? You are a former smoker who has quit within the past 15 years.  Talk to your health care provider about your screening options, when you should start screening, and how often you should be screened.  Colorectal Cancer  Routine colorectal cancer screening usually begins at 67 years  of age and should be repeated every 5-10 years until you are 68 years old. You may need to be screened more often if early forms of precancerous polyps or small growths are found. Your health care provider may recommend screening at an earlier age if you have risk factors for colon cancer.  Your health care provider may recommend using home test kits to check for hidden blood in the stool.  A small camera at the end of a tube can be used to examine your colon (sigmoidoscopy or colonoscopy). This checks for the earliest forms of colorectal cancer.  Prostate and Testicular Cancer  Depending on your age and overall health, your health care provider may do certain tests to screen for prostate and testicular cancer.  Talk to your health care provider about any symptoms or concerns you have about testicular or prostate cancer.  Skin Cancer  Check your skin from head to toe regularly.  Tell your health care provider about any new moles or changes in moles, especially if: ? There is a change in a mole's size, shape, or color. ? You have a mole that is larger than a pencil eraser.  Always use sunscreen. Apply sunscreen liberally and repeat throughout the day.  Protect yourself by wearing long sleeves, pants, a wide-brimmed hat, and sunglasses when outside.  What should I know about heart disease, diabetes, and high blood pressure?  If you are 15-34 years of age, have your blood pressure checked every  3-5 years. If you are 82 years of age or older, have your blood pressure checked every year. You should have your blood pressure measured twice-once when you are at a hospital or clinic, and once when you are not at a hospital or clinic. Record the average of the two measurements. To check your blood pressure when you are not at a hospital or clinic, you can use: ? An automated blood pressure machine at a pharmacy. ? A home blood pressure monitor.  Talk to your health care provider about your target  blood pressure.  If you are between 64-17 years old, ask your health care provider if you should take aspirin to prevent heart disease.  Have regular diabetes screenings by checking your fasting blood sugar level. ? If you are at a normal weight and have a low risk for diabetes, have this test once every three years after the age of 40. ? If you are overweight and have a high risk for diabetes, consider being tested at a younger age or more often.  A one-time screening for abdominal aortic aneurysm (AAA) by ultrasound is recommended for men aged 41-75 years who are current or former smokers. What should I know about preventing infection? Hepatitis B If you have a higher risk for hepatitis B, you should be screened for this virus. Talk with your health care provider to find out if you are at risk for hepatitis B infection. Hepatitis C Blood testing is recommended for:  Everyone born from [birth year] through 1966.  Anyone with known risk factors for hepatitis C.  Sexually Transmitted Diseases (STDs)  You should be screened each year for STDs including gonorrhea and chlamydia if: ? You are sexually active and are younger than 68 years of age. ? You are older than 68 years of age and your health care provider tells you that you are at risk for this type of infection. ? Your sexual activity has changed since you were last screened and you are at an increased risk for chlamydia or gonorrhea. Ask your health care provider if you are at risk.  Talk with your health care provider about whether you are at high risk of being infected with HIV. Your health care provider may recommend a prescription medicine to help prevent HIV infection.  What else can I do?  Schedule regular health, dental, and eye exams.  Stay current with your vaccines (immunizations).  Do not use any tobacco products, such as cigarettes, chewing tobacco, and e-cigarettes. If you need help quitting, ask your health care  provider.  Limit alcohol intake to no more than 2 drinks per day. One drink equals 12 ounces of beer, 5 ounces of wine, or 1 ounces of hard liquor.  Do not use street drugs.  Do not share needles.  Ask your health care provider for help if you need support or information about quitting drugs.  Tell your health care provider if you often feel depressed.  Tell your health care provider if you have ever been abused or do not feel safe at home. This information is not intended to replace advice given to you by your health care provider. Make sure you discuss any questions you have with your health care provider. Document Released: 08/19/2007 Document Revised: 10/20/2015 Document Reviewed: 11/24/2014 Elsevier Interactive Patient Education  [employer].

## 2017-03-13 NOTE — Assessment & Plan Note (Signed)
H/o this. Continue carvedilol.

## 2017-03-13 NOTE — Progress Notes (Signed)
BP 122/84 (BP Location: Left Arm, Patient Position: Sitting, Cuff Size: Normal)   Pulse 70   Temp 98 F (36.7 C) (Oral)   Ht 5' 10.5" (1.791 m)   Wt 224 lb 12 oz (101.9 kg)   SpO2 96%   BMI 31.79 kg/m    CC: medicare wellness visit/CPE Subjective:    Patient ID: Alec Maldonado, male    DOB: 08-16-1950, 67 y.o.   MRN: [id number]  HPI: Alec Maldonado is a 67 y.o. male presenting on 03/13/2017 for Medicare Wellness   Did not see Katha Cabal this year.   Hearing Screening   125Hz  250Hz  500Hz  1000Hz  2000Hz  3000Hz  4000Hz  6000Hz  8000Hz   Right ear:   20 40 25  25    Left ear:   20 40 20  40    Vision Screening Comments: Last eye exam, 12/2016 No falls No depression  Preventative: COLONOSCOPY 06/2016 diverticulosis, rpt 10 yrs Ardis Hughs) Prostate screen yearly  Flu yearly Tdap 2015 again 02/2015 Pneumovax 2017, prevnar 2016 shingrix - declines Advanced directive discussion - worked on this with Chief Executive Officer. Thinks has at home. Wife Hoyle Sauer would be HCPOA. Will review at home and bring me copy.  Seat belt use discussed Sunscreen use discussed. S/p BCC removed from back 2018 - Dr Ronnald Ramp. Sees yearly.  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 Interim medical history since our last visit reviewed. Allergies and medications reviewed and updated. Outpatient Medications Prior to Visit  Medication Sig Dispense Refill  . aspirin 81 MG tablet Take 81 mg by mouth daily.    . Ibuprofen 200 MG CAPS Take 2 capsules by mouth as needed.    . Omega-3 Fatty Acids (FISH OIL) 1200 MG CAPS Take 1,200 mg by mouth daily. Reported on 02/23/2015    . butalbital-acetaminophen-caffeine (FIORICET,  ESGIC) 50-325-40 MG tablet Take 1 tablet by mouth every 6 (six) hours as needed. 30 tablet 1  . carvedilol (COREG) 6.25 MG tablet TAKE 1 TABLET BY MOUTH TWICE DAILY WITH A MEAL 180 tablet 0  . ezetimibe-simvastatin (VYTORIN) 10-80 MG tablet TAKE 1 TABLET BY MOUTH AT BEDTIME. 90 tablet 3  . finasteride (PROSCAR) 5 MG tablet TAKE 1 TABLET BY MOUTH DAILY. 90 tablet 3  . tamsulosin (FLOMAX) 0.4 MG CAPS capsule TAKE 1 CAPSULE BY MOUTH AT BEDTIME 90 capsule 3  . 0.9 %  sodium chloride infusion      No facility-administered medications prior to visit.      Per HPI unless specifically indicated in ROS section below Review of Systems  Constitutional: Negative for activity change, appetite change, chills, fatigue, fever and unexpected weight change.  HENT: Negative for hearing loss.   Eyes: Negative for visual disturbance.  Respiratory: Negative for cough, chest tightness, shortness of breath and wheezing.   Cardiovascular: Negative for chest pain, palpitations and leg swelling.  Gastrointestinal: Negative for abdominal distention, abdominal pain, blood in stool, constipation, diarrhea, nausea and vomiting.  Genitourinary: Negative for difficulty urinating and hematuria.  Musculoskeletal: Negative for arthralgias, myalgias and neck pain.  Skin: Negative for rash.  Neurological: Negative for dizziness, seizures, syncope and headaches.  Hematological: Negative for adenopathy. Does not bruise/bleed easily.  Psychiatric/Behavioral: Negative for dysphoric mood. The patient is  not nervous/anxious.        Objective:    BP 122/84 (BP Location: Left Arm, Patient Position: Sitting, Cuff Size: Normal)   Pulse 70   Temp 98 F (36.7 C) (Oral)   Ht 5' 10.5" (1.791 m)   Wt 224 lb 12 oz (101.9 kg)   SpO2 96%   BMI 31.79 kg/m   Wt Readings from Last 3 Encounters:  03/13/17 224 lb 12 oz (101.9 kg)  06/28/16 240 lb (108.9 kg)  06/14/16 240 lb (108.9 kg)    Physical Exam  Constitutional: He is oriented  to person, place, and time. He appears well-developed and well-nourished. No distress.  HENT:  Head: Normocephalic and atraumatic.  Right Ear: Hearing, tympanic membrane, external ear and ear canal normal.  Left Ear: Hearing, tympanic membrane, external ear and ear canal normal.  Nose: Nose normal.  Mouth/Throat: Uvula is midline, oropharynx is clear and moist and mucous membranes are normal. No oropharyngeal exudate, posterior oropharyngeal edema or posterior oropharyngeal erythema.  Eyes: Conjunctivae and EOM are normal. Pupils are equal, round, and reactive to light. No scleral icterus.  Neck: Normal range of motion. Neck supple. Carotid bruit is not present. No thyromegaly present.  Cardiovascular: Normal rate, regular rhythm, normal heart sounds and intact distal pulses.  No murmur heard. Pulses:      Radial pulses are 2+ on the right side, and 2+ on the left side.  Pulmonary/Chest: Effort normal and breath sounds normal. No respiratory distress. He has no wheezes. He has no rales.  Abdominal: Soft. Bowel sounds are normal. He exhibits no distension and no mass. There is no tenderness. There is no rebound and no guarding.  Musculoskeletal: Normal range of motion. He exhibits no edema.  Lymphadenopathy:    He has no cervical adenopathy.  Neurological: He is alert and oriented to person, place, and time.  CN grossly intact, station and gait intact Recall 3/3 (remembers words from 2017) Calculation 5/5 [id number]  Skin: Skin is warm and dry. No rash noted.  Psychiatric: He has a normal mood and affect. His behavior is normal. Judgment and thought content normal.  Nursing note and vitals reviewed.  Results for orders placed or performed in visit on 03/12/17  PSA  Result Value Ref Range   PSA 0.71 0.10 - 4.00 ng/mL  CBC with Differential/Platelet  Result Value Ref Range   WBC 7.8 4.0 - 10.5 K/uL   RBC 5.13 4.22 - 5.81 Mil/uL   Hemoglobin 13.8 13.0 - 17.0 g/dL   HCT 42.3 39.0 - 52.0  %   MCV 82.6 78.0 - 100.0 fl   MCHC 32.7 30.0 - 36.0 g/dL   RDW 14.5 11.5 - 15.5 %   Platelets 299.0 150.0 - 400.0 K/uL   Neutrophils Relative % 60.3 43.0 - 77.0 %   Lymphocytes Relative 26.9 12.0 - 46.0 %   Monocytes Relative 9.8 3.0 - 12.0 %   Eosinophils Relative 2.4 0.0 - 5.0 %   Basophils Relative 0.6 0.0 - 3.0 %   Neutro Abs 4.7 1.4 - 7.7 K/uL   Lymphs Abs 2.1 0.7 - 4.0 K/uL   Monocytes Absolute 0.8 0.1 - 1.0 K/uL   Eosinophils Absolute 0.2 0.0 - 0.7 K/uL   Basophils Absolute 0.0 0.0 - 0.1 K/uL  Comprehensive metabolic panel  Result Value Ref Range   Sodium 140 135 - 145 mEq/L   Potassium 4.5 3.5 - 5.1 mEq/L   Chloride 104 96 - 112 mEq/L   CO2 28 19 -  32 mEq/L   Glucose, Bld 106 (H) 70 - 99 mg/dL   BUN 17 6 - 23 mg/dL   Creatinine, Ser 1.17 0.40 - 1.50 mg/dL   Total Bilirubin 0.6 0.2 - 1.2 mg/dL   Alkaline Phosphatase 64 39 - 117 U/L   AST 15 0 - 37 U/L   ALT 18 0 - 53 U/L   Total Protein 6.7 6.0 - 8.3 g/dL   Albumin 4.3 3.5 - 5.2 g/dL   Calcium 9.3 8.4 - 10.5 mg/dL   GFR 66.04 >60.00 mL/min  Lipid panel  Result Value Ref Range   Cholesterol 146 0 - 200 mg/dL   Triglycerides 169.0 (H) 0.0 - 149.0 mg/dL   HDL 28.00 (L) >39.00 mg/dL   VLDL 33.8 0.0 - 40.0 mg/dL   LDL Cholesterol 84 0 - 99 mg/dL   Total CHOL/HDL Ratio 5    NonHDL 118.00       Assessment & Plan:   Problem List Items Addressed This Visit    Advanced care planning/counseling discussion    Advanced directive discussion - worked on this with lawyer. Thinks has at home. Wife Hoyle Sauer would be HCPOA. Will review at home and bring me copy.      Benign prostatic hyperplasia    Chronic, stable. Forgot DRE today - will check next year. PSA stable.       Relevant Medications   finasteride (PROSCAR) 5 MG tablet   tamsulosin (FLOMAX) 0.4 MG CAPS capsule   Dyslipidemia    Chronic, stable on vytorin. Continue. The 10-year ASCVD risk score Mikey Bussing DC Brooke Bonito., et al., 2013) is: 15.1%   Values used to calculate  the score:     Age: 19 years     Sex: Male     Is Non-Hispanic African American: No     Diabetic: No     Tobacco smoker: No     Systolic Blood Pressure: 952 mmHg     Is BP treated: No     HDL Cholesterol: 28 mg/dL     Total Cholesterol: 146 mg/dL       Relevant Medications   ezetimibe-simvastatin (VYTORIN) 10-80 MG tablet   Health maintenance examination    Preventative protocols reviewed and updated unless pt declined. Discussed healthy diet and lifestyle.       Medicare annual wellness visit, subsequent - Primary    I have personally reviewed the Medicare Annual Wellness questionnaire and have noted 1. The patient's medical and social history 2. Their use of alcohol, tobacco or illicit drugs 3. Their current medications and supplements 4. The patient's functional ability including ADL's, fall risks, home safety risks and hearing or visual impairment. Cognitive function has been assessed and addressed as indicated.  5. Diet and physical activity 6. Evidence for depression or mood disorders The patients weight, height, BMI have been recorded in the chart. I have made referrals, counseling and provided education to the patient based on review of the above and I have provided the pt with a written personalized care plan for preventive services. Provider list updated.. See scanned questionairre as needed for further documentation. Reviewed preventative protocols and updated unless pt declined.       Migraines    Chronic, stable, controlled with rare fioricet      Relevant Medications   carvedilol (COREG) 6.25 MG tablet   ezetimibe-simvastatin (VYTORIN) 10-80 MG tablet   butalbital-acetaminophen-caffeine (FIORICET, ESGIC) 50-325-40 MG tablet   Status post mitral valve repair   SVT (supraventricular tachycardia) ([place])  H/o this. Continue carvedilol.       Relevant Medications   carvedilol (COREG) 6.25 MG tablet   ezetimibe-simvastatin (VYTORIN) 10-80 MG tablet        Follow up plan: Return in about 1 year (around 03/13/2018) for annual exam, prior fasting for blood work, medicare wellness visit.  Ria Bush, MD

## 2017-03-13 NOTE — Assessment & Plan Note (Signed)
Preventative protocols reviewed and updated unless pt declined. Discussed healthy diet and lifestyle.  

## 2017-03-13 NOTE — Assessment & Plan Note (Signed)
Chronic, stable, controlled with rare fioricet

## 2017-03-13 NOTE — Assessment & Plan Note (Signed)

## 2017-04-04 ENCOUNTER — Telehealth: Payer: Self-pay | Admitting: Family Medicine

## 2017-04-04 ENCOUNTER — Encounter: Payer: Self-pay | Admitting: Family Medicine

## 2017-04-04 NOTE — Telephone Encounter (Signed)
PT USES [facility] ON [address] IN [place]: pt request new script. Pt states he pays  $ 230.00 FOR 90 DAYS. Pt says if doc will write for both scripts separately Iit will only cost $20.00 FOR 90 DAYS. EZETIMIBE / SIMVASTATIN 10-80MG .  Call pt for clarification [phone].

## 2017-04-05 MED ORDER — EZETIMIBE 10 MG PO TABS: 10.0000 mg | ORAL_TABLET | Freq: Every day | ORAL | 3 refills | Status: DC

## 2017-04-05 MED ORDER — SIMVASTATIN 80 MG PO TABS: 80.0000 mg | ORAL_TABLET | Freq: Every day | ORAL | 3 refills | Status: DC

## 2017-04-05 NOTE — Telephone Encounter (Signed)
Spoke with pt's wife, Hoyle Sauer (on dpr), notifying her pt's rxs have been separately sent to the pharmacy.  Says she will inform the pt and expresses her thanks due to the high cost.

## 2017-04-05 NOTE — Telephone Encounter (Signed)
I have sent in both scripts for patient. plz notify pt.

## 2017-04-06 DIAGNOSIS — D3613 Benign neoplasm of peripheral nerves and autonomic nervous system of lower limb, including hip: Secondary | ICD-10-CM

## 2017-04-06 HISTORY — DX: Benign neoplasm of peripheral nerves and autonomic nervous system of lower limb, including hip: D36.13

## 2017-04-11 ENCOUNTER — Encounter: Payer: Self-pay | Admitting: Family Medicine

## 2017-06-09 ENCOUNTER — Other Ambulatory Visit: Payer: Self-pay | Admitting: Family Medicine

## 2017-06-10 IMAGING — DX DG CHEST 2V
2 series · 2 of 2 positions shown · non-contrast
Comparison: 02/29/2016

CLINICAL DATA: 65-year-old male with a history of cough

EXAM:
CHEST  2 VIEW

[chest pa]
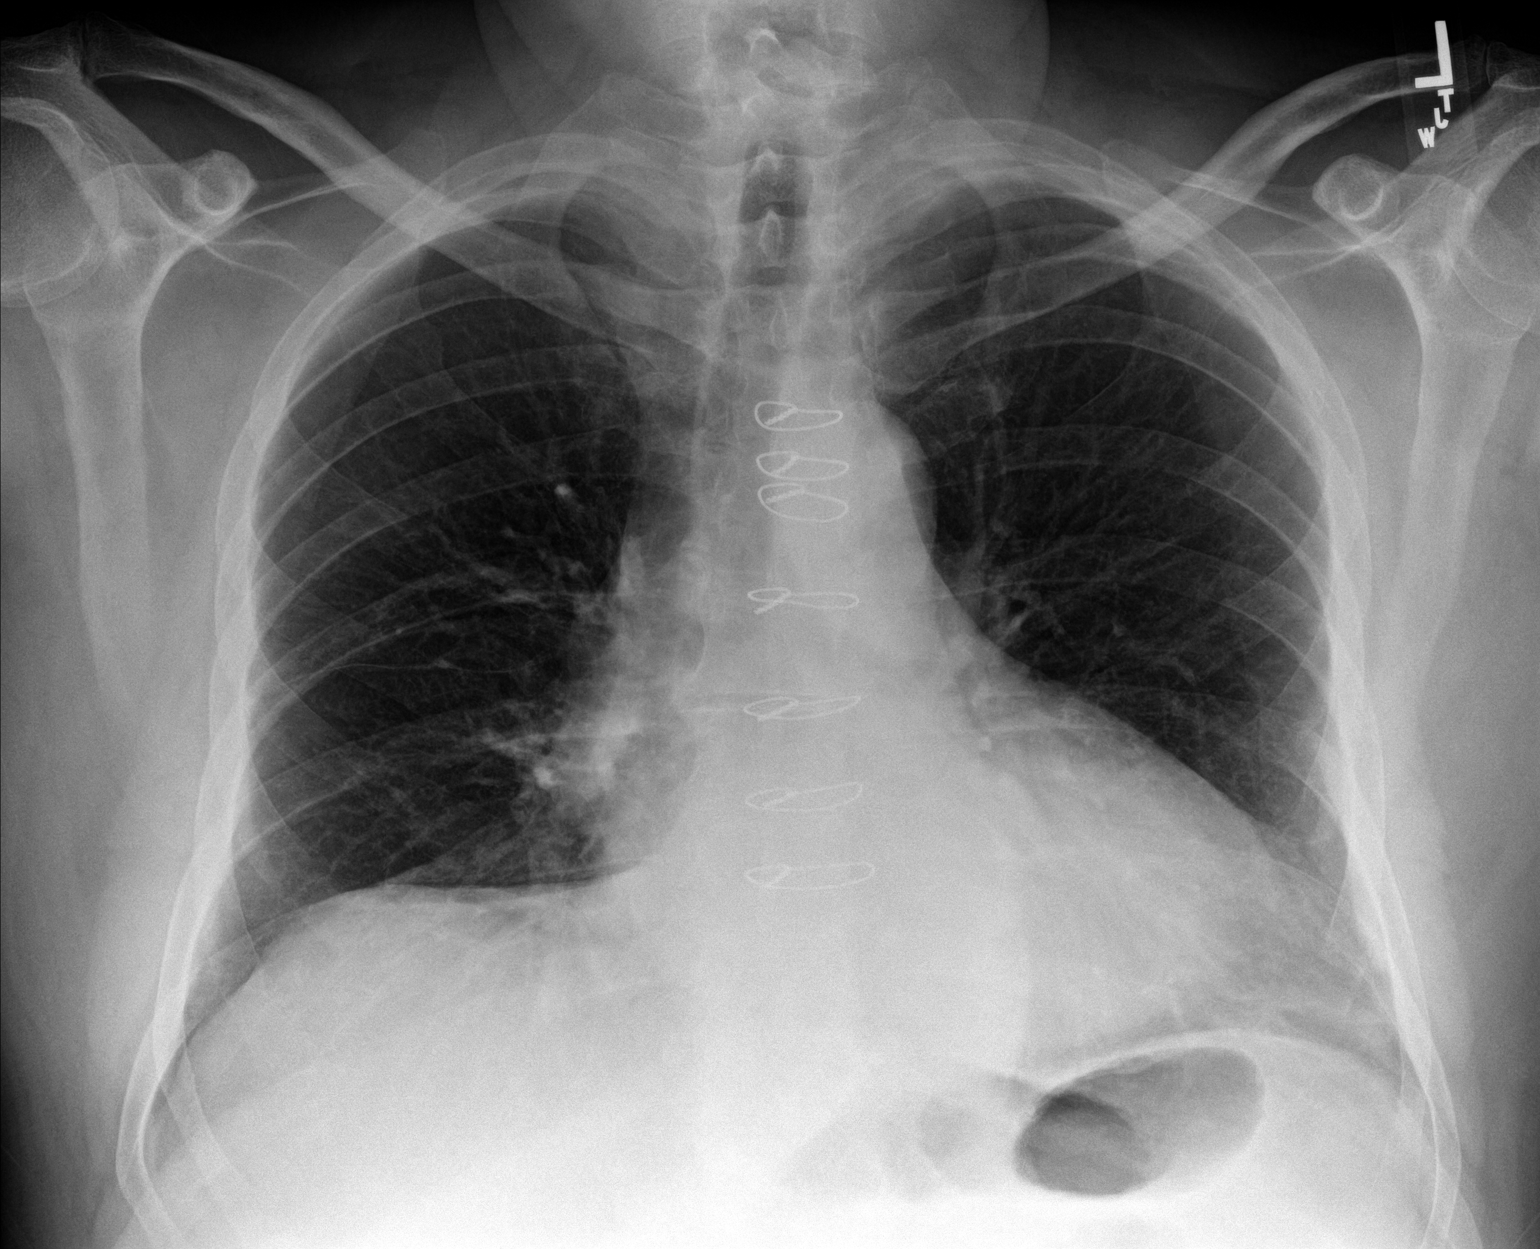

[chest lat]
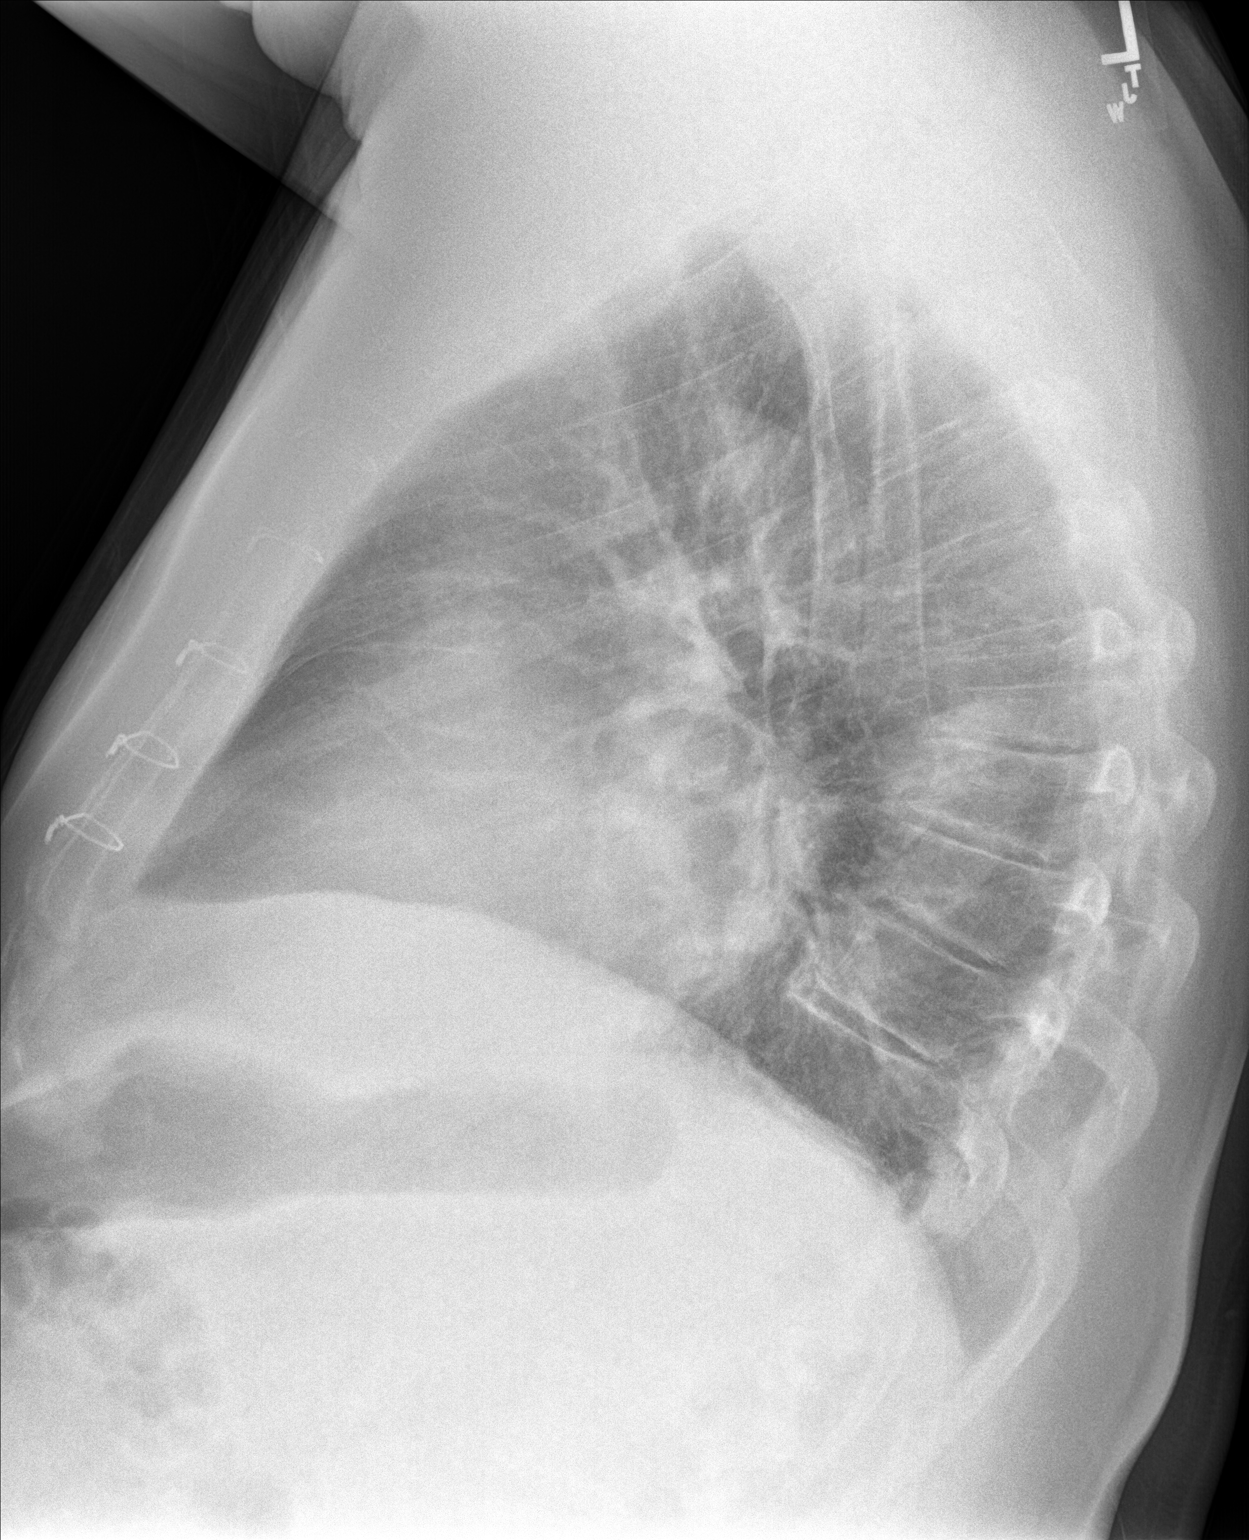

[2 of 2 positions shown; findings below may reference images not displayed]

FINDINGS: Cardiomediastinal silhouette unchanged.

Surgical changes of median sternotomy.

No pneumothorax, pleural effusion, or confluent airspace disease.
IMPRESSION: No radiographic evidence of acute cardiopulmonary disease.

Surgical changes of median sternotomy and CABG.

## 2017-06-20 ENCOUNTER — Emergency Department (HOSPITAL_COMMUNITY)
Admission: EM | Admit: 2017-06-20 | Discharge: 2017-06-20 | Disposition: A | Discharge status: Home / Self Care | Payer: Medicare Other | Attending: Emergency Medicine | Admitting: Emergency Medicine

## 2017-06-20 ENCOUNTER — Other Ambulatory Visit: Payer: Self-pay

## 2017-06-20 ENCOUNTER — Emergency Department (HOSPITAL_COMMUNITY): Payer: Medicare Other

## 2017-06-20 DIAGNOSIS — N179 Acute kidney failure, unspecified: Secondary | ICD-10-CM | POA: Diagnosis not present

## 2017-06-20 DIAGNOSIS — R079 Chest pain, unspecified: Secondary | ICD-10-CM

## 2017-06-20 DIAGNOSIS — E860 Dehydration: Secondary | ICD-10-CM | POA: Diagnosis not present

## 2017-06-20 DIAGNOSIS — Z7982 Long term (current) use of aspirin: Secondary | ICD-10-CM | POA: Diagnosis not present

## 2017-06-20 DIAGNOSIS — Z79899 Other long term (current) drug therapy: Secondary | ICD-10-CM | POA: Insufficient documentation

## 2017-06-20 DIAGNOSIS — I471 Supraventricular tachycardia: Secondary | ICD-10-CM | POA: Diagnosis not present

## 2017-06-20 DIAGNOSIS — Z955 Presence of coronary angioplasty implant and graft: Secondary | ICD-10-CM | POA: Diagnosis not present

## 2017-06-20 LAB — CBC
HEMATOCRIT: 42.6 % (ref 39.0–52.0)
HEMOGLOBIN: 13.7 g/dL (ref 13.0–17.0)
MCH: 26.9 pg (ref 26.0–34.0)
MCHC: 32.2 g/dL (ref 30.0–36.0)
MCV: 83.5 fL (ref 78.0–100.0)
PLATELETS: 287 10*3/uL (ref 150–400)
RBC: 5.1 MIL/uL (ref 4.22–5.81)
RDW: 14.2 % (ref 11.5–15.5)
WBC: 7.7 10*3/uL (ref 4.0–10.5)

## 2017-06-20 LAB — MAGNESIUM: Magnesium: 2.1 mg/dL (ref 1.7–2.4)

## 2017-06-20 LAB — COMPREHENSIVE METABOLIC PANEL
ALT: 23 U/L (ref 17–63)
AST: 22 U/L (ref 15–41)
Albumin: 4.1 g/dL (ref 3.5–5.0)
Alkaline Phosphatase: 66 U/L (ref 38–126)
Anion gap: 11 (ref 5–15)
BILIRUBIN TOTAL: 0.8 mg/dL (ref 0.3–1.2)
BUN: 18 mg/dL (ref 6–20)
CHLORIDE: 107 mmol/L (ref 101–111)
CO2: 22 mmol/L (ref 22–32)
CREATININE: 1.42 mg/dL — AB (ref 0.61–1.24)
Calcium: 9.2 mg/dL (ref 8.9–10.3)
GFR calc non Af Amer: 50 mL/min — ABNORMAL LOW (ref >60)
GFR, EST AFRICAN AMERICAN: 58 mL/min — AB (ref >60)
Glucose, Bld: 121 mg/dL — ABNORMAL HIGH (ref 65–99)
POTASSIUM: 4 mmol/L (ref 3.5–5.1)
Sodium: 140 mmol/L (ref 135–145)
TOTAL PROTEIN: 7.1 g/dL (ref 6.5–8.1)

## 2017-06-20 LAB — I-STAT TROPONIN, ED: TROPONIN I, POC: 0 ng/mL (ref 0.00–0.08)

## 2017-06-20 MED ORDER — SODIUM CHLORIDE 0.9 % IV BOLUS
1000.0000 mL | Freq: Once | INTRAVENOUS | Status: AC
  Administered 2017-06-20: 1000 mL via INTRAVENOUS

## 2017-06-20 NOTE — ED Triage Notes (Signed)
Pt endorses tachycardia x 1 hour, has hx of svt x 3. HR 197 in triage. No shob. Endorses chest discomfort. [id number]

## 2017-06-20 NOTE — ED Provider Notes (Signed)
[facility] EMERGENCY DEPARTMENT Provider Note  CSN: [id number] Arrival date & time: 06/20/17 1155  Chief Complaint(s) SVT and Chest Pain  HPI Alec Maldonado is a 67 y.o. male with a history of SVT who presents to the emergency department with SVT and associated chest pressure that is nonradiating.  Symptoms began 2 hours prior to arrival.  No alleviating or aggravating factors.  Has not tried performing any vagal maneuvers.  Denies any recent fevers or infections.  Denies any vomiting or diarrhea.  No abdominal pain.  No associated shortness of breath.  States that he has had similar presentations in the past requiring adenosine.  Denies any prior cardioversion needed.  Denies any other physical complaints.  The history is provided by the patient.    Past Medical History Past Medical History:  Diagnosis Date  . Allergy   . Aortic insufficiency    Mild, echo, January, 2014  . BCC (basal cell carcinoma), back 2017   s/p excisional biospy by derm  . BPH (benign prostatic hypertrophy)   . Chest discomfort    nuclear 06/24/09, suggestion prior small inferior MI with moderate peri-infarvt ischemia versus variable diaphragmatic attenuation  . Colon polyps    colonoscopy 07/02/06, repeat in 10 years  . Dyslipidemia   . Ejection fraction    EF 55%, echo, October, 2010  //   EF 45-50%,  catheterization, 2011  . Heart murmur   . Hyperlipidemia   . Migraines   . Mitral regurgitation    Mitral valve repair 2001  . Neurofibroma of foot 04/2017   s/p removal by derm  . Palpitations 07/2009    helped with carvedilol   . Pleurisy   . Postural dizziness    mild, no orthostatic changes by blood pressure check  . Pre-syncope    January, 2014  . RBBB    First seen January, 2014  . Right ventricular dysfunction    Mild/moderate right ventricular dysfunction by echo, January, 2014, no tricuspid regurgitation, therefore right heart pressure could not be estimated.  . Shortness  of breath   . Status post mitral valve repair    2001,  //  echo, October, 2010, good function of the repaired mitral valve  . SVT (supraventricular tachycardia) ([place]) 03/11/2013   Isolated episodes - last 03/2013 after flu treated with adenosine cardioversion in ER.   Marland Kitchen Thyroid disease    Patient Active Problem List   Diagnosis Date Noted  . Medicare annual wellness visit, subsequent 02/23/2015  . Advanced care planning/counseling discussion 02/23/2015  . Health maintenance examination 08/28/2013  . SVT (supraventricular tachycardia) ([place]) 03/11/2013  . Dysplastic nevus of trunk 08/20/2012  . Aortic insufficiency   . Right ventricular dysfunction   . RBBB   . Status post mitral valve repair   . Chest discomfort   . Mitral regurgitation   . Migraines   . Exertional dyspnea 12/14/2008  . Dyslipidemia 12/13/2008  . Benign prostatic hyperplasia 05/10/2007   Home Medication(s) Prior to Admission medications   Medication Sig Start Date End Date Taking? Authorizing Provider  aspirin 81 MG tablet Take 81 mg by mouth daily.   Yes [provider]  butalbital-acetaminophen-caffeine (FIORICET, ESGIC) 50-325-40 MG tablet Take 1 tablet by mouth every 6 (six) hours as needed. 03/13/17  Yes Ria Bush, MD  carvedilol (COREG) 6.25 MG tablet TAKE 1 TABLET BY MOUTH TWICE DAILY WITH A MEAL 06/11/17  Yes Ria Bush, MD  cetirizine (ZYRTEC ALLERGY) 10 MG tablet Take 10  mg by mouth daily as needed for allergies.   Yes [provider]  ezetimibe (ZETIA) 10 MG tablet Take 1 tablet (10 mg total) by mouth daily. 04/05/17  Yes Ria Bush, MD  finasteride (PROSCAR) 5 MG tablet TAKE 1 TABLET BY MOUTH DAILY. 03/13/17  Yes Ria Bush, MD  Omega-3 Fatty Acids (FISH OIL) 1200 MG CAPS Take 1,200 mg by mouth daily. Reported on 02/23/2015   Yes [provider]  simvastatin (ZOCOR) 80 MG tablet Take 1 tablet (80 mg total) by mouth daily. 04/05/17  Yes Ria Bush, MD    tamsulosin (FLOMAX) 0.4 MG CAPS capsule TAKE 1 CAPSULE BY MOUTH AT BEDTIME 03/13/17  Yes Ria Bush, MD  ezetimibe-simvastatin (VYTORIN) 10-80 MG tablet TAKE 1 TABLET BY MOUTH AT BEDTIME. 03/13/17   Ria Bush, MD                                                                                                                                    Past Surgical History Past Surgical History:  Procedure Laterality Date  . aneurysmal dilatation     proximal LAD cath 2001, reoeat catheter 2004, no significant change  . aneurysmal dilatiation  07/15/09   no change, normal right heart filling pressures and LVEDP- EF 45-50%  . APPENDECTOMY  1970  . CARDIAC CATHETERIZATION  2010   mild global LV dysfunction EF 45-50%, no significant CAD obstruction, aneurysmal dilatation LAD  . COLONOSCOPY  2008  . COLONOSCOPY  06/2016   diverticulosis, rpt 10 yrs Ardis Hughs)  . CORONARY ANGIOPLASTY  09/1999   severe MR o/w normal limits  . CORONARY ANGIOPLASTY  12/19/02   pos. CAD, mild LV dysfunction- med treatment  . HERNIA REPAIR  01/11/05   left, Dr. Hassell Done  . I&D EXTREMITY Right 02/04/2015   Procedure: IRRIGATION AND DEBRIDEMENT RIGHT INDEX FINGER OPEN FRACTURE AND REPAIR DIGITAL NERVE AND ARTERY;  Surgeon: Leanora Cover, MD;  Location: [facility];  Service: Orthopedics;  Laterality: Right;  . MITRAL VALVE REPAIR  01/22/00   good mitral valve function- echo 12/2008- EF 55% / nuclear 06/2009- EF 36%,? reliable? / EF 45-50%, cath rated 02/2010  . ORIF ELBOW FRACTURE     left  . POLYPECTOMY    . THYROID SURGERY    . THYROIDECTOMY     left, due to goiter 1994   Family History Family History  Problem Relation Age of Onset  . Alzheimer's disease Mother   . Heart disease Mother        CHF  . Arthritis Father   . Heart disease Father        leaking mitral valve  . Benign prostatic hyperplasia Father   . Heart disease Brother        murmur  . Hypertension Neg Hx   . Diabetes Neg Hx   . Depression Neg Hx    . Alcohol abuse Neg Hx   . Drug abuse  Neg Hx   . Stroke Neg Hx   . Cancer Neg Hx   . Colon cancer Neg Hx   . Esophageal cancer Neg Hx   . Rectal cancer Neg Hx   . Stomach cancer Neg Hx     Social History Social History   Tobacco Use  . Smoking status: Never Smoker  . Smokeless tobacco: Never Used  Substance Use Topics  . Alcohol use: No  . Drug use: No   Allergies Cheratussin ac [guaifenesin-codeine]  Review of Systems Review of Systems All other systems are reviewed and are negative for acute change except as noted in the HPI  Physical Exam Vital Signs  I have reviewed the triage vital signs BP 90/64 (BP Location: Right Arm)   Pulse (!) 193   Temp 97.8 F (36.6 C) (Oral)   Resp 18   SpO2 99%   Physical Exam  Constitutional: He is oriented to person, place, and time. He appears well-developed and well-nourished. He appears ill. No distress.  HENT:  Head: Normocephalic and atraumatic.  Nose: Nose normal.  Eyes: Pupils are equal, round, and reactive to light. Conjunctivae and EOM are normal. Right eye exhibits no discharge. Left eye exhibits no discharge. No scleral icterus.  Neck: Normal range of motion. Neck supple.  Cardiovascular: Regular rhythm. Tachycardia present. Exam reveals no gallop and no friction rub.  No murmur heard. Pulmonary/Chest: Effort normal and breath sounds normal. No stridor. No respiratory distress. He has no rales.  Abdominal: Soft. He exhibits no distension. There is no tenderness.  Musculoskeletal: He exhibits no edema or tenderness.  Neurological: He is alert and oriented to person, place, and time.  Skin: Skin is warm. No rash noted. He is diaphoretic. No erythema. There is pallor.  Psychiatric: He has a normal mood and affect.  Vitals reviewed.   ED Results and Treatments Labs (all labs ordered are listed, but only abnormal results are displayed) Labs Reviewed  COMPREHENSIVE METABOLIC PANEL - Abnormal; Notable for the following  components:      Result Value   Glucose, Bld 121 (*)    Creatinine, Ser 1.42 (*)    GFR calc non Af Amer 50 (*)    GFR calc Af Amer 58 (*)    All other components within normal limits  CBC  MAGNESIUM  I-STAT TROPONIN, ED                                                                                                                         EKG  EKG Interpretation  Date/Time:  Wednesday June 20 2017 11:58:58 EDT Ventricular Rate:  196 PR Interval:    QRS Duration: 128 QT Interval:  292 QTC Calculation: 527 R Axis:   61 Text Interpretation:  SVT Right bundle branch block Abnormal ECG Confirmed by Addison Lank [phone]) on 06/20/2017 1:14:17 PM      Radiology Dg Chest Port 1 View  Result Date: 06/20/2017 CLINICAL DATA:  Tachycardia for 1 hour.  EXAM: PORTABLE CHEST 1 VIEW COMPARISON:  PA and lateral chest 03/14/2016 and 02/29/2016. FINDINGS: Defibrillator pad is in place. Heart size is upper normal. Lungs are clear. No pneumothorax or pleural effusion. The patient is status post median sternotomy. No focal bony abnormality. IMPRESSION: No acute disease. Electronically Signed   By: Inge Rise M.D.   On: 06/20/2017 12:58   Pertinent labs & imaging results that were available during my care of the patient were reviewed by me and considered in my medical decision making (see chart for details).  Medications Ordered in ED Medications  sodium chloride 0.9 % bolus 1,000 mL (0 mLs Intravenous Stopped 06/20/17 1431)                                                                                                                                    Procedures Procedures CRITICAL CARE Performed by: Grayce Sessions Adrian Dinovo Total critical care time: 30 minutes Critical care time was exclusive of separately billable procedures and treating other patients. Critical care was necessary to treat or prevent imminent or life-threatening deterioration. Critical care was time spent personally by me  on the following activities: development of treatment plan with patient and/or surrogate as well as nursing, discussions with consultants, evaluation of patient's response to treatment, examination of patient, obtaining history from patient or surrogate, ordering and performing treatments and interventions, ordering and review of laboratory studies, ordering and review of radiographic studies, pulse oximetry and re-evaluation of patient's condition.   (including critical care time)  Medical Decision Making / ED Course I have reviewed the nursing notes for this encounter and the patient's prior records (if available in EHR or on provided paperwork).    EKG consistent with SVT.  Patient is hypotensive and ill-appearing.  Considered cardioversion.  While prepping for cardioversion patient was placed in Trendelenburg and vagal maneuver attempted resulting and conversion to normal sinus rhythm.  Immediately following conversion, patient is chest pain had resolved.  Repeat blood pressure improved with systolics in the 323F.  Patient was provided with IV fluids.  Screening labs were obtained.  Believe his chest pain was secondary to demand from SVT.  Repeat EKG without acute ischemic changes.  Labs did reveal evidence of AK I, likely secondary to dehydration from lack of p.o hydration.  Patient was monitored for several hours without recurrence of SVT.  Remained stable and felt he was appropriate for discharge with strict return precautions.  The patient appears reasonably screened and/or stabilized for discharge and I doubt any other medical condition or other [facility] requiring further screening, evaluation, or treatment in the ED at this time prior to discharge.     Final Clinical Impression(s) / ED Diagnoses Final diagnoses:  Chest pain  SVT (supraventricular tachycardia) (HCC)  AKI (acute kidney injury) ([place])  Dehydration    Disposition: Discharge  Condition: Good  I have discussed the  results, Dx and Tx plan with the  patient who expressed understanding and agree(s) with the plan. Discharge instructions discussed at great length. The patient was given strict return precautions who verbalized understanding of the instructions. No further questions at time of discharge.    ED Discharge Orders    None       Follow Up: Ria Bush, MD [place] [address] [phone]  Schedule an appointment as soon as possible for a visit  As needed     This chart was dictated using voice recognition software.  Despite best efforts to proofread,  errors can occur which can change the documentation meaning.   Fatima Blank, MD 06/20/17 1556

## 2017-09-24 DIAGNOSIS — H43812 Vitreous degeneration, left eye: Secondary | ICD-10-CM | POA: Diagnosis not present

## 2017-10-22 DIAGNOSIS — H43812 Vitreous degeneration, left eye: Secondary | ICD-10-CM | POA: Diagnosis not present

## 2018-01-02 DIAGNOSIS — H43813 Vitreous degeneration, bilateral: Secondary | ICD-10-CM | POA: Diagnosis not present

## 2018-01-02 DIAGNOSIS — Z961 Presence of intraocular lens: Secondary | ICD-10-CM | POA: Diagnosis not present

## 2018-01-02 DIAGNOSIS — H43391 Other vitreous opacities, right eye: Secondary | ICD-10-CM | POA: Diagnosis not present

## 2018-01-02 DIAGNOSIS — H43392 Other vitreous opacities, left eye: Secondary | ICD-10-CM | POA: Diagnosis not present

## 2018-01-07 ENCOUNTER — Ambulatory Visit (INDEPENDENT_AMBULATORY_CARE_PROVIDER_SITE_OTHER): Payer: Medicare Other | Admitting: Orthopaedic Surgery

## 2018-01-07 ENCOUNTER — Ambulatory Visit (INDEPENDENT_AMBULATORY_CARE_PROVIDER_SITE_OTHER): Payer: Self-pay

## 2018-01-07 ENCOUNTER — Encounter (INDEPENDENT_AMBULATORY_CARE_PROVIDER_SITE_OTHER): Payer: Self-pay | Admitting: Orthopaedic Surgery

## 2018-01-07 VITALS — BP 125/79 | HR 69 | Ht 70.5 in | Wt 210.0 lb

## 2018-01-07 DIAGNOSIS — M25551 Pain in right hip: Secondary | ICD-10-CM | POA: Diagnosis not present

## 2018-01-07 DIAGNOSIS — M545 Low back pain, unspecified: Secondary | ICD-10-CM

## 2018-01-07 DIAGNOSIS — M25552 Pain in left hip: Secondary | ICD-10-CM

## 2018-01-07 DIAGNOSIS — M5136 Other intervertebral disc degeneration, lumbar region: Secondary | ICD-10-CM

## 2018-01-07 DIAGNOSIS — G8929 Other chronic pain: Secondary | ICD-10-CM | POA: Diagnosis not present

## 2018-01-07 NOTE — Progress Notes (Signed)
Office Visit Note   Patient: Alec Maldonado           Date of Birth: 12/11/1950           MRN: [id number] Visit Date: 01/07/2018              Requested by: Ria Bush, MD [address] [place], [place] [address] PCP: Ria Bush, MD   Assessment & Plan: Visit Diagnoses:  1. Hip pain, bilateral   2. Chronic midline low back pain, unspecified whether sciatica present   3. Other intervertebral disc degeneration, lumbar region     Plan: MRI scan lumbar spine.  Believe the majority of his pain in left parasacral region and right hip originates from the back.  Total office visit 45 minutes 50% of the time in counseling regarding possible origin of pain and treatment options depending upon MRI scan results  Follow-Up Instructions: Return after MRI L-S spine.   Orders:  Orders Placed This Encounter  Procedures  . XR Lumbar Spine 2-3 Views  . XR HIPS BILAT W OR W/O PELVIS 3-4 VIEWS  . MR Lumbar Spine w/o contrast   No orders of the defined types were placed in this encounter.     Procedures: No procedures performed   Clinical Data: No additional findings.   Subjective: Chief Complaint  Patient presents with  . New Patient (Initial Visit)    bi lat hip pian  left pain 2-3  yrs geting worse, r hip pain 6 mo ago, no injury, injectons or prior surgeries  [name] is 67 years old visited the office for evaluation left paralumbar and right hip pain.  He relates having some discomfort over 3 to 4 years in the parasacral region.  Seems to be worse when he sits for a length of time or when he walks.  He is had occasional pain that refers into his left lower extremity.  He has not had any numbness or tingling.  No changes in bowel or bladder habit.  He has had some discomfort in the area of his right groin in the last 6 months.  No related injury or trauma.  Not having much back pain no prior back or hip surgery  HPI  Review of Systems  Constitutional: Negative for  fatigue and fever.  HENT: Negative for ear pain.   Eyes: Negative for pain.  Respiratory: Negative for cough and shortness of breath.   Cardiovascular: Negative for leg swelling.  Gastrointestinal: Negative for constipation and diarrhea.  Genitourinary: Negative for difficulty urinating.  Musculoskeletal: Negative for back pain and neck pain.  Skin: Negative for rash.  Allergic/Immunologic: Negative for food allergies.  Neurological: Positive for weakness. Negative for numbness.  Hematological: Does not bruise/bleed easily.  Psychiatric/Behavioral: Positive for sleep disturbance.     Objective: Vital Signs: BP 125/79 (BP Location: Left Arm, Patient Position: Sitting, Cuff Size: Normal)   Pulse 69   Ht 5' 10.5" (1.791 m)   Wt 210 lb (95.3 kg)   BMI 29.71 kg/m   Physical Exam  Constitutional: He is oriented to person, place, and time. He appears well-developed and well-nourished.  HENT:  Mouth/Throat: Oropharynx is clear and moist.  Eyes: Pupils are equal, round, and reactive to light. EOM are normal.  Pulmonary/Chest: Effort normal.  Neurological: He is alert and oriented to person, place, and time.  Skin: Skin is warm and dry.  Psychiatric: He has a normal mood and affect. His behavior is normal.  Ortho Exam awake alert and oriented x3.  Comfortable sitting.  Painless range of motion of both hips with internal/external rotation.  No loss of motion.  No distal edema.  Neurovascular exam intact.  Straight leg raise negative.  No localized areas of tenderness about either right or left hip.  Does have some very minimal percussible tenderness in the left parasacral region.  Walks without a limp.  No ambulatory aid.  Specialty Comments:  No specialty comments available.  Imaging: Xr Hips Bilat W Or W/o Pelvis 3-4 Views  Result Date: 01/07/2018 AP the pelvis and lateral both hips were obtained.  Slight decrease in the joint space of the left hip.  Right hip appears to be  intact.  No obvious bony abnormality within either hemipelvis.  Moderate amount of bowel gas  Xr Lumbar Spine 2-3 Views  Result Date: 01/07/2018 Films of lumbar spine were obtained in several projections.  There is significant decrease in the disc space height no listhesis.  No acute change. facet sclerosis identified at L5-S1    PMFS History: Patient Active Problem List   Diagnosis Date Noted  . Medicare annual wellness visit, subsequent 02/23/2015  . Advanced care planning/counseling discussion 02/23/2015  . Health maintenance examination 08/28/2013  . SVT (supraventricular tachycardia) ([place]) 03/11/2013  . Dysplastic nevus of trunk 08/20/2012  . Aortic insufficiency   . Right ventricular dysfunction   . RBBB   . Status post mitral valve repair   . Chest discomfort   . Mitral regurgitation   . Migraines   . Exertional dyspnea 12/14/2008  . Dyslipidemia 12/13/2008  . Benign prostatic hyperplasia 05/10/2007   Past Medical History:  Diagnosis Date  . Allergy   . Aortic insufficiency    Mild, echo, January, 2014  . BCC (basal cell carcinoma), back 2017   s/p excisional biospy by derm  . BPH (benign prostatic hypertrophy)   . Chest discomfort    nuclear 06/24/09, suggestion prior small inferior MI with moderate peri-infarvt ischemia versus variable diaphragmatic attenuation  . Colon polyps    colonoscopy 07/02/06, repeat in 10 years  . Dyslipidemia   . Ejection fraction    EF 55%, echo, October, 2010  //   EF 45-50%,  catheterization, 2011  . Heart murmur   . Hyperlipidemia   . Migraines   . Mitral regurgitation    Mitral valve repair 2001  . Neurofibroma of foot 04/2017   s/p removal by derm  . Palpitations 07/2009    helped with carvedilol   . Pleurisy   . Postural dizziness    mild, no orthostatic changes by blood pressure check  . Pre-syncope    January, 2014  . RBBB    First seen January, 2014  . Right ventricular dysfunction    Mild/moderate right ventricular  dysfunction by echo, January, 2014, no tricuspid regurgitation, therefore right heart pressure could not be estimated.  . Shortness of breath   . Status post mitral valve repair    2001,  //  echo, October, 2010, good function of the repaired mitral valve  . SVT (supraventricular tachycardia) ([place]) 03/11/2013   Isolated episodes - last 03/2013 after flu treated with adenosine cardioversion in ER.   Marland Kitchen Thyroid disease     Family History  Problem Relation Age of Onset  . Alzheimer's disease Mother   . Heart disease Mother        CHF  . Arthritis Father   . Heart disease Father  leaking mitral valve  . Benign prostatic hyperplasia Father   . Heart disease Brother        murmur  . Hypertension Neg Hx   . Diabetes Neg Hx   . Depression Neg Hx   . Alcohol abuse Neg Hx   . Drug abuse Neg Hx   . Stroke Neg Hx   . Cancer Neg Hx   . Colon cancer Neg Hx   . Esophageal cancer Neg Hx   . Rectal cancer Neg Hx   . Stomach cancer Neg Hx     Past Surgical History:  Procedure Laterality Date  . aneurysmal dilatation     proximal LAD cath 2001, reoeat catheter 2004, no significant change  . aneurysmal dilatiation  07/15/09   no change, normal right heart filling pressures and LVEDP- EF 45-50%  . APPENDECTOMY  1970  . CARDIAC CATHETERIZATION  2010   mild global LV dysfunction EF 45-50%, no significant CAD obstruction, aneurysmal dilatation LAD  . COLONOSCOPY  2008  . COLONOSCOPY  06/2016   diverticulosis, rpt 10 yrs Ardis Hughs)  . CORONARY ANGIOPLASTY  09/1999   severe MR o/w normal limits  . CORONARY ANGIOPLASTY  12/19/02   pos. CAD, mild LV dysfunction- med treatment  . EYE SURGERY Left   . HERNIA REPAIR  01/11/05   left, Dr. Hassell Done  . I&D EXTREMITY Right 02/04/2015   Procedure: IRRIGATION AND DEBRIDEMENT RIGHT INDEX FINGER OPEN FRACTURE AND REPAIR DIGITAL NERVE AND ARTERY;  Surgeon: Leanora Cover, MD;  Location: [facility];  Service: Orthopedics;  Laterality: Right;  . MITRAL VALVE REPAIR   01/22/00   good mitral valve function- echo 12/2008- EF 55% / nuclear 06/2009- EF 36%,? reliable? / EF 45-50%, cath rated 02/2010  . ORIF ELBOW FRACTURE     left  . POLYPECTOMY    . THYROID SURGERY    . THYROIDECTOMY     left, due to goiter 1994   Social History   Occupational History  . Occupation: Artist  Tobacco Use  . Smoking status: Never Smoker  . Smokeless tobacco: Never Used  Substance and Sexual Activity  . Alcohol use: No  . Drug use: No  . Sexual activity: Yes

## 2018-01-09 DIAGNOSIS — H43812 Vitreous degeneration, left eye: Secondary | ICD-10-CM | POA: Diagnosis not present

## 2018-01-09 DIAGNOSIS — H43312 Vitreous membranes and strands, left eye: Secondary | ICD-10-CM | POA: Diagnosis not present

## 2018-01-19 ENCOUNTER — Ambulatory Visit
Admission: RE | Admit: 2018-01-19 | Discharge: 2018-01-19 | Disposition: A | Discharge status: Home / Self Care | Payer: Medicare Other | Source: Ambulatory Visit | Attending: Orthopaedic Surgery | Admitting: Orthopaedic Surgery

## 2018-01-19 DIAGNOSIS — M5136 Other intervertebral disc degeneration, lumbar region: Secondary | ICD-10-CM

## 2018-01-19 DIAGNOSIS — M48061 Spinal stenosis, lumbar region without neurogenic claudication: Secondary | ICD-10-CM | POA: Diagnosis not present

## 2018-01-21 DIAGNOSIS — H43813 Vitreous degeneration, bilateral: Secondary | ICD-10-CM | POA: Diagnosis not present

## 2018-01-21 DIAGNOSIS — H43391 Other vitreous opacities, right eye: Secondary | ICD-10-CM | POA: Diagnosis not present

## 2018-01-29 ENCOUNTER — Ambulatory Visit (INDEPENDENT_AMBULATORY_CARE_PROVIDER_SITE_OTHER): Payer: Medicare Other | Admitting: Orthopaedic Surgery

## 2018-01-29 ENCOUNTER — Encounter (INDEPENDENT_AMBULATORY_CARE_PROVIDER_SITE_OTHER): Payer: Self-pay | Admitting: Orthopaedic Surgery

## 2018-01-29 VITALS — BP 121/76 | HR 84 | Ht 70.0 in | Wt 215.0 lb

## 2018-01-29 DIAGNOSIS — M5442 Lumbago with sciatica, left side: Secondary | ICD-10-CM | POA: Diagnosis not present

## 2018-01-29 DIAGNOSIS — G8929 Other chronic pain: Secondary | ICD-10-CM | POA: Diagnosis not present

## 2018-01-29 DIAGNOSIS — M5441 Lumbago with sciatica, right side: Secondary | ICD-10-CM

## 2018-01-29 NOTE — Progress Notes (Signed)
Office Visit Note   Patient: Alec Maldonado           Date of Birth: 01-22-1951           MRN: [id number] Visit Date: 01/29/2018              Requested by: Ria Bush, MD [address] [place], [place] [address] PCP: Ria Bush, MD   Assessment & Plan: Visit Diagnoses:  1. Chronic bilateral low back pain with bilateral sciatica     Plan: MRI of the lumbar spine demonstrates advanced disc and facet degeneration at L5 and S1.  There is mild lateral recess and mild neuroforaminal stenosis.  No central stenosis.  Long discussion with Mr. and Mrs. Knaus regarding the above.  I would suggest a course of physical therapy and an epidural steroid injection.  We will check back in 1 month.  I think the majority of his pain is most likely related to his back.  Will monitor his response  Follow-Up Instructions: Return in about 1 month (around 02/28/2018).   Orders:  Orders Placed This Encounter  Procedures  . Epidural Steroid Injection - Lumbar/Sacral (Ancillary Performed)  . Ambulatory referral to Physical Therapy   No orders of the defined types were placed in this encounter.     Procedures: No procedures performed   Clinical Data: No additional findings.   Subjective: Chief Complaint  Patient presents with  . Lower Back - Follow-up  . Back Pain    have more left hip , some pain , discuss mri results     HPI  Review of Systems  Constitutional: Negative.   HENT: Negative.   Eyes: Negative.   Respiratory: Negative.   Cardiovascular: Negative.   Gastrointestinal: Negative.   Endocrine: Negative.   Genitourinary: Negative.   Musculoskeletal: Positive for back pain.  Allergic/Immunologic: Negative.   Neurological: Negative.   Hematological: Negative.   Psychiatric/Behavioral: Negative.      Objective: Vital Signs: BP 121/76 (BP Location: Right Arm)   Pulse 84   Ht 5\' 10"  (1.778 m)   Wt 215 lb (97.5 kg)   BMI 30.85 kg/m   Physical Exam    Constitutional: He is oriented to person, place, and time. He appears well-developed and well-nourished.  HENT:  Mouth/Throat: Oropharynx is clear and moist.  Eyes: Pupils are equal, round, and reactive to light. EOM are normal.  Pulmonary/Chest: Effort normal.  Neurological: He is alert and oriented to person, place, and time.  Skin: Skin is warm and dry.  Psychiatric: He has a normal mood and affect. His behavior is normal.    Ortho Exam awake alert and oriented x3.  Comfortable sitting.  Straight leg raise negative.  Walks without a limp.  Painless range of motion of both hips and specifically the left hip which was noted to have a small amount of arthritis on prior films.  No distal edema.  No percussible tenderness of the lumbar spine Specialty Comments:  No specialty comments available.  Imaging: No results found.   PMFS History: Patient Active Problem List   Diagnosis Date Noted  . Medicare annual wellness visit, subsequent 02/23/2015  . Advanced care planning/counseling discussion 02/23/2015  . Health maintenance examination 08/28/2013  . SVT (supraventricular tachycardia) ([place]) 03/11/2013  . Dysplastic nevus of trunk 08/20/2012  . Aortic insufficiency   . Right ventricular dysfunction   . RBBB   . Status post mitral valve repair   . Chest discomfort   . Mitral  regurgitation   . Migraines   . Exertional dyspnea 12/14/2008  . Dyslipidemia 12/13/2008  . Benign prostatic hyperplasia 05/10/2007   Past Medical History:  Diagnosis Date  . Allergy   . Aortic insufficiency    Mild, echo, January, 2014  . BCC (basal cell carcinoma), back 2017   s/p excisional biospy by derm  . BPH (benign prostatic hypertrophy)   . Chest discomfort    nuclear 06/24/09, suggestion prior small inferior MI with moderate peri-infarvt ischemia versus variable diaphragmatic attenuation  . Colon polyps    colonoscopy 07/02/06, repeat in 10 years  . Dyslipidemia   . Ejection fraction    EF  55%, echo, October, 2010  //   EF 45-50%,  catheterization, 2011  . Heart murmur   . Hyperlipidemia   . Migraines   . Mitral regurgitation    Mitral valve repair 2001  . Neurofibroma of foot 04/2017   s/p removal by derm  . Palpitations 07/2009    helped with carvedilol   . Pleurisy   . Postural dizziness    mild, no orthostatic changes by blood pressure check  . Pre-syncope    January, 2014  . RBBB    First seen January, 2014  . Right ventricular dysfunction    Mild/moderate right ventricular dysfunction by echo, January, 2014, no tricuspid regurgitation, therefore right heart pressure could not be estimated.  . Shortness of breath   . Status post mitral valve repair    2001,  //  echo, October, 2010, good function of the repaired mitral valve  . SVT (supraventricular tachycardia) ([place]) 03/11/2013   Isolated episodes - last 03/2013 after flu treated with adenosine cardioversion in ER.   Marland Kitchen Thyroid disease     Family History  Problem Relation Age of Onset  . Alzheimer's disease Mother   . Heart disease Mother        CHF  . Arthritis Father   . Heart disease Father        leaking mitral valve  . Benign prostatic hyperplasia Father   . Heart disease Brother        murmur  . Hypertension Neg Hx   . Diabetes Neg Hx   . Depression Neg Hx   . Alcohol abuse Neg Hx   . Drug abuse Neg Hx   . Stroke Neg Hx   . Cancer Neg Hx   . Colon cancer Neg Hx   . Esophageal cancer Neg Hx   . Rectal cancer Neg Hx   . Stomach cancer Neg Hx     Past Surgical History:  Procedure Laterality Date  . aneurysmal dilatation     proximal LAD cath 2001, reoeat catheter 2004, no significant change  . aneurysmal dilatiation  07/15/09   no change, normal right heart filling pressures and LVEDP- EF 45-50%  . APPENDECTOMY  1970  . CARDIAC CATHETERIZATION  2010   mild global LV dysfunction EF 45-50%, no significant CAD obstruction, aneurysmal dilatation LAD  . COLONOSCOPY  2008  . COLONOSCOPY  06/2016     diverticulosis, rpt 10 yrs Ardis Hughs)  . CORONARY ANGIOPLASTY  09/1999   severe MR o/w normal limits  . CORONARY ANGIOPLASTY  12/19/02   pos. CAD, mild LV dysfunction- med treatment  . EYE SURGERY Left   . HERNIA REPAIR  01/11/05   left, Dr. Hassell Done  . I&D EXTREMITY Right 02/04/2015   Procedure: IRRIGATION AND DEBRIDEMENT RIGHT INDEX FINGER OPEN FRACTURE AND REPAIR DIGITAL NERVE AND ARTERY;  Surgeon:  Leanora Cover, MD;  Location: [facility];  Service: Orthopedics;  Laterality: Right;  . MITRAL VALVE REPAIR  01/22/00   good mitral valve function- echo 12/2008- EF 55% / nuclear 06/2009- EF 36%,? reliable? / EF 45-50%, cath rated 02/2010  . ORIF ELBOW FRACTURE     left  . POLYPECTOMY    . THYROID SURGERY    . THYROIDECTOMY     left, due to goiter 1994   Social History   Occupational History  . Occupation: Artist  Tobacco Use  . Smoking status: Never Smoker  . Smokeless tobacco: Never Used  Substance and Sexual Activity  . Alcohol use: No  . Drug use: No  . Sexual activity: Yes

## 2018-02-04 ENCOUNTER — Ambulatory Visit (INDEPENDENT_AMBULATORY_CARE_PROVIDER_SITE_OTHER): Payer: Medicare Other | Admitting: Orthopaedic Surgery

## 2018-02-04 DIAGNOSIS — H43811 Vitreous degeneration, right eye: Secondary | ICD-10-CM | POA: Diagnosis not present

## 2018-02-04 DIAGNOSIS — H43311 Vitreous membranes and strands, right eye: Secondary | ICD-10-CM | POA: Diagnosis not present

## 2018-02-05 DIAGNOSIS — M5136 Other intervertebral disc degeneration, lumbar region: Secondary | ICD-10-CM | POA: Diagnosis not present

## 2018-02-05 DIAGNOSIS — M545 Low back pain: Secondary | ICD-10-CM | POA: Diagnosis not present

## 2018-02-11 ENCOUNTER — Ambulatory Visit
Admission: RE | Admit: 2018-02-11 | Discharge: 2018-02-11 | Disposition: A | Discharge status: Home / Self Care | Payer: Medicare Other | Source: Ambulatory Visit | Attending: Orthopaedic Surgery | Admitting: Orthopaedic Surgery

## 2018-02-11 DIAGNOSIS — M5442 Lumbago with sciatica, left side: Principal | ICD-10-CM

## 2018-02-11 DIAGNOSIS — M545 Low back pain: Secondary | ICD-10-CM | POA: Diagnosis not present

## 2018-02-11 DIAGNOSIS — G8929 Other chronic pain: Secondary | ICD-10-CM

## 2018-02-11 DIAGNOSIS — M5441 Lumbago with sciatica, right side: Principal | ICD-10-CM

## 2018-02-11 MED ORDER — IOPAMIDOL (ISOVUE-M 200) INJECTION 41%
1.0000 mL | Freq: Once | INTRAMUSCULAR | Status: AC
  Administered 2018-02-11: 1 mL via EPIDURAL

## 2018-02-11 MED ORDER — METHYLPREDNISOLONE ACETATE 40 MG/ML INJ SUSP (RADIOLOG
120.0000 mg | Freq: Once | INTRAMUSCULAR | Status: AC
  Administered 2018-02-11: 120 mg via EPIDURAL

## 2018-02-11 NOTE — Discharge Instructions (Signed)

## 2018-03-04 ENCOUNTER — Other Ambulatory Visit: Payer: Self-pay | Admitting: Family Medicine

## 2018-03-14 NOTE — Progress Notes (Signed)
BP 120/80 (BP Location: Left Arm, Patient Position: Sitting, Cuff Size: Normal)   Pulse 86   Temp 98 F (36.7 C) (Oral)   Ht 5' 10.5" (1.791 m)   Wt 219 lb 8 oz (99.6 kg)   SpO2 97%   BMI 31.05 kg/m    CC: cough, fever, flank pain Subjective:    Patient ID: Alec Maldonado, male    DOB: 06/26/1950, 68 y.o.   MRN: [id number]  HPI: Alec Maldonado is a 68 y.o. male presenting on 03/15/2018 for Cough (C/o cough with clear mucous causing. Started 4 days ago. Tried Nyquil, not helpful. ); Fever (Had fever- max 99.1.); and Flank Pain (C/o left side pain that started this morning. )   5d h/o low grade fever, deep somewhat productive cough, sore throat, R eye feels irritated, headache. Slept sitting up last night. Dyspnea. L flank pain started today. Head > chest congestion, R>L. No ear or tooth pain.  L groin pain for months. No bulge.  Urinary trouble for 3 months - incomplete emptying, leaking. Urinary urgency. No dysuria, blood in urine.   Wife sick recently. Taking nyquil without benefit.  Non smoker  No h/o asthma/COPD.     Relevant past medical, surgical, family and social history reviewed and updated as indicated. Interim medical history since our last visit reviewed. Allergies and medications reviewed and updated. Outpatient Medications Prior to Visit  Medication Sig Dispense Refill  . aspirin 81 MG tablet Take 81 mg by mouth daily.    . butalbital-acetaminophen-caffeine (FIORICET, ESGIC) 50-325-40 MG tablet Take 1 tablet by mouth every 6 (six) hours as needed. 30 tablet 1  . carvedilol (COREG) 6.25 MG tablet TAKE 1 TABLET BY MOUTH TWICE DAILY WITH A MEAL 180 tablet 3  . cetirizine (ZYRTEC ALLERGY) 10 MG tablet Take 10 mg by mouth daily as needed for allergies.    Marland Kitchen ezetimibe (ZETIA) 10 MG tablet Take 1 tablet (10 mg total) by mouth daily. 90 tablet 3  . finasteride (PROSCAR) 5 MG tablet TAKE 1 TABLET BY MOUTH DAILY 90 tablet 3  . Omega-3 Fatty Acids (FISH OIL) 1200 MG CAPS Take  1,200 mg by mouth daily. Reported on 02/23/2015    . simvastatin (ZOCOR) 80 MG tablet Take 1 tablet (80 mg total) by mouth daily. 90 tablet 3  . tamsulosin (FLOMAX) 0.4 MG CAPS capsule TAKE 1 CAPSULE BY MOUTH AT BEDTIME 90 capsule 3  . ezetimibe-simvastatin (VYTORIN) 10-80 MG tablet TAKE 1 TABLET BY MOUTH AT BEDTIME. 90 tablet 3   No facility-administered medications prior to visit.      Per HPI unless specifically indicated in ROS section below Review of Systems Objective:    BP 120/80 (BP Location: Left Arm, Patient Position: Sitting, Cuff Size: Normal)   Pulse 86   Temp 98 F (36.7 C) (Oral)   Ht 5' 10.5" (1.791 m)   Wt 219 lb 8 oz (99.6 kg)   SpO2 97%   BMI 31.05 kg/m   Wt Readings from Last 3 Encounters:  03/15/18 219 lb 8 oz (99.6 kg)  01/29/18 215 lb (97.5 kg)  01/07/18 210 lb (95.3 kg)    Physical Exam Vitals signs and nursing note reviewed.  Constitutional:      General: He is not in acute distress.    Appearance: Normal appearance. He is well-developed.     Comments: Tired appearing  HENT:     Head: Normocephalic and atraumatic.     Right Ear: Hearing, tympanic membrane,  ear canal and external ear normal.     Left Ear: Hearing, tympanic membrane, ear canal and external ear normal.     Nose: Mucosal edema and congestion (R>L nasal mucosal congestion/mucous) present. No rhinorrhea.     Right Sinus: No maxillary sinus tenderness or frontal sinus tenderness.     Left Sinus: No maxillary sinus tenderness or frontal sinus tenderness.     Comments: Tender to palpation R temple    Mouth/Throat:     Mouth: Mucous membranes are moist.     Pharynx: Oropharynx is clear. Uvula midline. No oropharyngeal exudate or posterior oropharyngeal erythema.     Tonsils: No tonsillar abscesses.  Eyes:     General: No scleral icterus.    Conjunctiva/sclera: Conjunctivae normal.     Pupils: Pupils are equal, round, and reactive to light.  Neck:     Musculoskeletal: Normal range of  motion and neck supple.  Cardiovascular:     Rate and Rhythm: Normal rate and regular rhythm.     Pulses: Normal pulses.     Heart sounds: Normal heart sounds. No murmur.  Pulmonary:     Effort: Pulmonary effort is normal. No respiratory distress.     Breath sounds: Normal breath sounds. No wheezing, rhonchi or rales.     Comments: Lungs clear. Deep cough present.  Abdominal:     General: Abdomen is flat. Bowel sounds are normal. There is no distension.     Palpations: Abdomen is soft. There is no mass.     Tenderness: There is no abdominal tenderness. There is no right CVA tenderness, left CVA tenderness, guarding or rebound.     Hernia: No hernia is present. There is no hernia in the right inguinal area or left inguinal area.  Lymphadenopathy:     Cervical: No cervical adenopathy.  Skin:    General: Skin is warm and dry.     Findings: No rash.  Neurological:     Mental Status: He is alert.       Results for orders placed or performed in visit on 03/15/18  POCT Urinalysis Dipstick (Automated)  Result Value Ref Range   Color, UA yellow    Clarity, UA clear    Glucose, UA Negative Negative   Bilirubin, UA negative    Ketones, UA negative    Spec Grav, UA 1.025 1.010 - 1.025   Blood, UA negative    pH, UA 5.5 5.0 - 8.0   Protein, UA Negative Negative   Urobilinogen, UA 0.2 0.2 or 1.0 E.U./dL   Nitrite, UA negative    Leukocytes, UA Negative Negative    Assessment & Plan:   Problem List Items Addressed This Visit    Left groin pain    Intermittent over last few months. No signs of hernia. With urinary symptoms, check UA r/o UTI as cause. Pt agrees with plan.       Relevant Orders   POCT Urinalysis Dipstick (Automated) (Completed)   Acute sinusitis - Primary    Anticipate viral at this time given short duration of symptoms. Discussed viral vs bacterial cause. Supportive care reviewed. Treat with tussionex at night. WASP for augmentin provided with indications when to  fill.       Relevant Medications   amoxicillin-clavulanate (AUGMENTIN) 875-125 MG tablet   chlorpheniramine-HYDROcodone (TUSSIONEX PENNKINETIC ER) 10-8 MG/5ML SUER       Meds ordered this encounter  Medications  . amoxicillin-clavulanate (AUGMENTIN) 875-125 MG tablet    Sig: Take 1 tablet by mouth  2 (two) times daily for 10 days.    Dispense:  20 tablet    Refill:  0  . chlorpheniramine-HYDROcodone (TUSSIONEX PENNKINETIC ER) 10-8 MG/5ML SUER    Sig: Take 5 mLs by mouth at bedtime as needed for cough.    Dispense:  115 mL    Refill:  0   Orders Placed This Encounter  Procedures  . POCT Urinalysis Dipstick (Automated)   Patient Instructions  You have a sinus infection, ?viral vs bacterial. If viral, sinus symptoms should improve over 7-10 days Take medicine as prescribed: tussionex at night time.  Fill augmentin antibiotic 10 day course if not improving over next few days.  Push fluids and plenty of rest. Nasal saline irrigation or neti pot to help drain sinuses. May use plain mucinex with plenty of fluid to help mobilize mucous. Please let us know if fever >101.5, trouble opening/closing mouth, difficulty swallowing, or worsening instead of improving as expected.  Urine test today.    Follow up plan: Return if symptoms worsen or fail to improve.  Ria Bush, MD

## 2018-03-15 ENCOUNTER — Encounter: Payer: Self-pay | Admitting: Family Medicine

## 2018-03-15 ENCOUNTER — Ambulatory Visit (INDEPENDENT_AMBULATORY_CARE_PROVIDER_SITE_OTHER): Payer: Medicare Other | Admitting: Family Medicine

## 2018-03-15 VITALS — BP 120/80 | HR 86 | Temp 98.0°F | Ht 70.5 in | Wt 219.5 lb

## 2018-03-15 DIAGNOSIS — R1032 Left lower quadrant pain: Secondary | ICD-10-CM

## 2018-03-15 DIAGNOSIS — G8929 Other chronic pain: Secondary | ICD-10-CM | POA: Insufficient documentation

## 2018-03-15 DIAGNOSIS — J019 Acute sinusitis, unspecified: Secondary | ICD-10-CM | POA: Diagnosis not present

## 2018-03-15 LAB — POC URINALSYSI DIPSTICK (AUTOMATED)
BILIRUBIN UA: NEGATIVE
GLUCOSE UA: NEGATIVE
Ketones, UA: NEGATIVE
Leukocytes, UA: NEGATIVE
Nitrite, UA: NEGATIVE
Protein, UA: NEGATIVE
RBC UA: NEGATIVE
Spec Grav, UA: 1.025 (ref 1.010–1.025)
Urobilinogen, UA: 0.2 E.U./dL
pH, UA: 5.5 (ref 5.0–8.0)

## 2018-03-15 MED ORDER — AMOXICILLIN-POT CLAVULANATE 875-125 MG PO TABS: 1.0000 | ORAL_TABLET | Freq: Two times a day (BID) | ORAL | 0 refills | Status: AC

## 2018-03-15 MED ORDER — HYDROCOD POLST-CPM POLST ER 10-8 MG/5ML PO SUER: 5.0000 mL | Freq: Every evening | ORAL | 0 refills | Status: DC | PRN

## 2018-03-15 NOTE — Patient Instructions (Addendum)
You have a sinus infection, ?viral vs bacterial. If viral, sinus symptoms should improve over 7-10 days Take medicine as prescribed: tussionex at night time.  Fill augmentin antibiotic 10 day course if not improving over next few days.  Push fluids and plenty of rest. Nasal saline irrigation or neti pot to help drain sinuses. May use plain mucinex with plenty of fluid to help mobilize mucous. Please let us know if fever >101.5, trouble opening/closing mouth, difficulty swallowing, or worsening instead of improving as expected.  Urine test today.

## 2018-03-15 NOTE — Assessment & Plan Note (Signed)
Intermittent over last few months. No signs of hernia. With urinary symptoms, check UA r/o UTI as cause. Pt agrees with plan.

## 2018-03-15 NOTE — Assessment & Plan Note (Signed)
Anticipate viral at this time given short duration of symptoms. Discussed viral vs bacterial cause. Supportive care reviewed. Treat with tussionex at night. WASP for augmentin provided with indications when to fill.

## 2018-03-19 ENCOUNTER — Telehealth: Payer: Self-pay | Admitting: Family Medicine

## 2018-03-19 NOTE — Telephone Encounter (Signed)
Left message asking pt to call office please r/s 2/11 appointment with dr g

## 2018-03-28 ENCOUNTER — Other Ambulatory Visit: Payer: Self-pay | Admitting: Family Medicine

## 2018-04-09 ENCOUNTER — Ambulatory Visit: Payer: Medicare Other

## 2018-04-16 ENCOUNTER — Encounter: Payer: Medicare Other | Admitting: Family Medicine

## 2018-05-10 ENCOUNTER — Ambulatory Visit (INDEPENDENT_AMBULATORY_CARE_PROVIDER_SITE_OTHER): Payer: Medicare Other

## 2018-05-10 VITALS — BP 100/80 | HR 74 | Temp 97.6°F | Ht 71.5 in | Wt 222.3 lb

## 2018-05-10 DIAGNOSIS — E785 Hyperlipidemia, unspecified: Secondary | ICD-10-CM

## 2018-05-10 DIAGNOSIS — N3943 Post-void dribbling: Secondary | ICD-10-CM

## 2018-05-10 DIAGNOSIS — N401 Enlarged prostate with lower urinary tract symptoms: Secondary | ICD-10-CM | POA: Diagnosis not present

## 2018-05-10 DIAGNOSIS — Z0000 Encounter for general adult medical examination without abnormal findings: Secondary | ICD-10-CM

## 2018-05-10 LAB — COMPREHENSIVE METABOLIC PANEL
ALBUMIN: 4.6 g/dL (ref 3.5–5.2)
ALT: 25 U/L (ref 0–53)
AST: 19 U/L (ref 0–37)
Alkaline Phosphatase: 62 U/L (ref 39–117)
BILIRUBIN TOTAL: 0.7 mg/dL (ref 0.2–1.2)
BUN: 17 mg/dL (ref 6–23)
CALCIUM: 9.5 mg/dL (ref 8.4–10.5)
CHLORIDE: 105 meq/L (ref 96–112)
CO2: 28 meq/L (ref 19–32)
CREATININE: 1.1 mg/dL (ref 0.40–1.50)
GFR: 66.48 mL/min (ref >60.00)
Glucose, Bld: 85 mg/dL (ref 70–99)
Potassium: 4.4 mEq/L (ref 3.5–5.1)
Sodium: 139 mEq/L (ref 135–145)
Total Protein: 6.9 g/dL (ref 6.0–8.3)

## 2018-05-10 LAB — LIPID PANEL
CHOLESTEROL: 126 mg/dL (ref 0–200)
HDL: 34.4 mg/dL — AB (ref >39.00)
LDL Cholesterol: 66 mg/dL (ref 0–99)
NonHDL: 91.96
Total CHOL/HDL Ratio: 4
Triglycerides: 130 mg/dL (ref 0.0–149.0)
VLDL: 26 mg/dL (ref 0.0–40.0)

## 2018-05-10 LAB — CBC WITH DIFFERENTIAL/PLATELET
BASOS PCT: 0.8 % (ref 0.0–3.0)
Basophils Absolute: 0.1 10*3/uL (ref 0.0–0.1)
Eosinophils Absolute: 0.1 10*3/uL (ref 0.0–0.7)
Eosinophils Relative: 1.8 % (ref 0.0–5.0)
HEMATOCRIT: 40.7 % (ref 39.0–52.0)
HEMOGLOBIN: 13.7 g/dL (ref 13.0–17.0)
LYMPHS PCT: 25.7 % (ref 12.0–46.0)
Lymphs Abs: 2 10*3/uL (ref 0.7–4.0)
MCHC: 33.6 g/dL (ref 30.0–36.0)
MCV: 82.2 fl (ref 78.0–100.0)
MONOS PCT: 9.5 % (ref 3.0–12.0)
Monocytes Absolute: 0.7 10*3/uL (ref 0.1–1.0)
NEUTROS ABS: 4.8 10*3/uL (ref 1.4–7.7)
Neutrophils Relative %: 62.2 % (ref 43.0–77.0)
PLATELETS: 295 10*3/uL (ref 150.0–400.0)
RBC: 4.96 Mil/uL (ref 4.22–5.81)
RDW: 14.6 % (ref 11.5–15.5)
WBC: 7.7 10*3/uL (ref 4.0–10.5)

## 2018-05-10 LAB — PSA: PSA: 0.78 ng/mL (ref 0.10–4.00)

## 2018-05-10 NOTE — Progress Notes (Signed)
PCP notes:   Health maintenance:  No gaps identified.  Abnormal screenings:   None  Patient concerns:   None  Nurse concerns:  None  Next PCP appt:   05/13/18 @ 1415

## 2018-05-10 NOTE — Patient Instructions (Signed)
Mr. Mah , Thank you for taking time to come for your Medicare Wellness Visit. I appreciate your ongoing commitment to your health goals. Please review the following plan we discussed and let me know if I can assist you in the future.   These are the goals we discussed: Goals    . Increase physical activity     Starting 05/10/2018 I will continue to walk 1-2 miles once weekly and to chop wood for 2 hours every other day.        This is a list of the screening recommended for you and due dates:  Health Maintenance  Topic Date Due  . DTaP/Tdap/Td vaccine (3 - Td) 02/03/2025  . Tetanus Vaccine  02/03/2025  . Colon Cancer Screening  06/29/2026  . Flu Shot  Completed  .  Hepatitis C: One time screening is recommended by Center for Disease Control  (CDC) for  adults born from [birth year] through 1966.   Completed  . Pneumonia vaccines  Completed   Preventive Care for Adults  A healthy lifestyle and preventive care can promote health and wellness. Preventive health guidelines for adults include the following key practices.  . A routine yearly physical is a good way to check with your health care provider about your health and preventive screening. It is a chance to share any concerns and updates on your health and to receive a thorough exam.  . Visit your dentist for a routine exam and preventive care every 6 months. Brush your teeth twice a day and floss once a day. Good oral hygiene prevents tooth decay and gum disease.  . The frequency of eye exams is based on your age, health, family medical history, use  of contact lenses, and other factors. Follow your health care provider's recommendations for frequency of eye exams.  . Eat a healthy diet. Foods like vegetables, fruits, whole grains, low-fat dairy products, and lean protein foods contain the nutrients you need without too many calories. Decrease your intake of foods high in solid fats, added sugars, and salt. Eat the right amount of calories  for you. Get information about a proper diet from your health care provider, if necessary.  . Regular physical exercise is one of the most important things you can do for your health. Most adults should get at least 150 minutes of moderate-intensity exercise (any activity that increases your heart rate and causes you to sweat) each week. In addition, most adults need muscle-strengthening exercises on 2 or more days a week.  Silver Sneakers may be a benefit available to you. To determine eligibility, you may visit the website: www.silversneakers.com or contact program at 920-546-0073 Mon-Fri between 8AM-8PM.   . Maintain a healthy weight. The body mass index (BMI) is a screening tool to identify possible weight problems. It provides an estimate of body fat based on height and weight. Your health care provider can find your BMI and can help you achieve or maintain a healthy weight.   For adults 20 years and older: ? A BMI below 18.5 is considered underweight. ? A BMI of 18.5 to 24.9 is normal. ? A BMI of 25 to 29.9 is considered overweight. ? A BMI of 30 and above is considered obese.   . Maintain normal blood lipids and cholesterol levels by exercising and minimizing your intake of saturated fat. Eat a balanced diet with plenty of fruit and vegetables. Blood tests for lipids and cholesterol should begin at age 40 and be repeated every 5  years. If your lipid or cholesterol levels are high, you are over 50, or you are at high risk for heart disease, you may need your cholesterol levels checked more frequently. Ongoing high lipid and cholesterol levels should be treated with medicines if diet and exercise are not working.  . If you smoke, find out from your health care provider how to quit. If you do not use tobacco, please do not start.  . If you choose to drink alcohol, please do not consume more than 2 drinks per day. One drink is considered to be 12 ounces (355 mL) of beer, 5 ounces (148 mL) of  wine, or 1.5 ounces (44 mL) of liquor.  . If you are 55-47 years old, ask your health care provider if you should take aspirin to prevent strokes.  . Use sunscreen. Apply sunscreen liberally and repeatedly throughout the day. You should seek shade when your shadow is shorter than you. Protect yourself by wearing long sleeves, pants, a wide-brimmed hat, and sunglasses year round, whenever you are outdoors.  . Once a month, do a whole body skin exam, using a mirror to look at the skin on your back. Tell your health care provider of new moles, moles that have irregular borders, moles that are larger than a pencil eraser, or moles that have changed in shape or color.

## 2018-05-10 NOTE — Progress Notes (Signed)
Subjective:   Alec Maldonado is a 68 y.o. male who presents for Medicare Annual/Subsequent preventive examination.  Review of Systems:  N/A Cardiac Risk Factors include: advanced age (>36men, >51 women);male gender;obesity (BMI >30kg/m2);dyslipidemia     Objective:    Vitals: BP 100/80 (BP Location: Right Arm, Patient Position: Sitting, Cuff Size: Normal)   Pulse 74   Temp 97.6 F (36.4 C) (Oral)   Ht 5' 11.5" (1.816 m) Comment: shoes  Wt 222 lb 4.8 oz (100.8 kg)   SpO2 97%   BMI 30.57 kg/m   Body mass index is 30.57 kg/m.  Advanced Directives 06/20/2017 06/14/2016 02/24/2016 02/04/2015  Does Patient Have a Medical Advance Directive? Yes Yes Yes No;Yes  Type of Paramedic of [place];Living will [place];Living will [place];Living will -  Copy of [place] in Chart? No - copy requested - No - copy requested -    Tobacco Social History   Tobacco Use  Smoking Status Never Smoker  Smokeless Tobacco Never Used     Counseling given: No   Clinical Intake:  Pre-visit preparation completed: Yes  Pain : No/denies pain Pain Score: 0-No pain     Nutritional Status: BMI > 30  Obese Nutritional Risks: None Diabetes: No  How often do you need to have someone help you when you read instructions, pamphlets, or other written materials from your doctor or pharmacy?: 1 - Never What is the last grade level you completed in school?: Technical college  Interpreter Needed?: No  Comments: pt lives with spouse Information entered by :: LPinson, LPN  Past Medical History:  Diagnosis Date  . Allergy   . Aortic insufficiency    Mild, echo, January, 2014  . BCC (basal cell carcinoma), back 2017   s/p excisional biospy by derm  . BPH (benign prostatic hypertrophy)   . Chest discomfort    nuclear 06/24/09, suggestion prior small inferior MI with moderate peri-infarvt ischemia versus variable  diaphragmatic attenuation  . Colon polyps    colonoscopy 07/02/06, repeat in 10 years  . Dyslipidemia   . Ejection fraction    EF 55%, echo, October, 2010  //   EF 45-50%,  catheterization, 2011  . Heart murmur   . Hyperlipidemia   . Migraines   . Mitral regurgitation    Mitral valve repair 2001  . Neurofibroma of foot 04/2017   s/p removal by derm  . Palpitations 07/2009    helped with carvedilol   . Pleurisy   . Postural dizziness    mild, no orthostatic changes by blood pressure check  . Pre-syncope    January, 2014  . RBBB    First seen January, 2014  . Right ventricular dysfunction    Mild/moderate right ventricular dysfunction by echo, January, 2014, no tricuspid regurgitation, therefore right heart pressure could not be estimated.  . Shortness of breath   . Status post mitral valve repair    2001,  //  echo, October, 2010, good function of the repaired mitral valve  . SVT (supraventricular tachycardia) ([place]) 03/11/2013   Isolated episodes - last 03/2013 after flu treated with adenosine cardioversion in ER.   Marland Kitchen Thyroid disease    Past Surgical History:  Procedure Laterality Date  . aneurysmal dilatation     proximal LAD cath 2001, reoeat catheter 2004, no significant change  . aneurysmal dilatiation  07/15/09   no change, normal right heart filling pressures and LVEDP- EF 45-50%  .  APPENDECTOMY  1970  . CARDIAC CATHETERIZATION  2010   mild global LV dysfunction EF 45-50%, no significant CAD obstruction, aneurysmal dilatation LAD  . COLONOSCOPY  2008  . COLONOSCOPY  06/2016   diverticulosis, rpt 10 yrs Ardis Hughs)  . CORONARY ANGIOPLASTY  09/1999   severe MR o/w normal limits  . CORONARY ANGIOPLASTY  12/19/02   pos. CAD, mild LV dysfunction- med treatment  . EYE SURGERY Left   . HERNIA REPAIR  01/11/05   left, Dr. Hassell Done  . I&D EXTREMITY Right 02/04/2015   Procedure: IRRIGATION AND DEBRIDEMENT RIGHT INDEX FINGER OPEN FRACTURE AND REPAIR DIGITAL NERVE AND ARTERY;  Surgeon:  Leanora Cover, MD;  Location: [facility];  Service: Orthopedics;  Laterality: Right;  . MITRAL VALVE REPAIR  01/22/00   good mitral valve function- echo 12/2008- EF 55% / nuclear 06/2009- EF 36%,? reliable? / EF 45-50%, cath rated 02/2010  . ORIF ELBOW FRACTURE     left  . POLYPECTOMY    . THYROID SURGERY    . THYROIDECTOMY     left, due to goiter 1994   Family History  Problem Relation Age of Onset  . Alzheimer's disease Mother   . Heart disease Mother        CHF  . Arthritis Father   . Heart disease Father        leaking mitral valve  . Benign prostatic hyperplasia Father   . Heart disease Brother        murmur  . Hypertension Neg Hx   . Diabetes Neg Hx   . Depression Neg Hx   . Alcohol abuse Neg Hx   . Drug abuse Neg Hx   . Stroke Neg Hx   . Cancer Neg Hx   . Colon cancer Neg Hx   . Esophageal cancer Neg Hx   . Rectal cancer Neg Hx   . Stomach cancer Neg Hx    Social History   Socioeconomic History  . Marital status: Married    Spouse name: Not on file  . Number of children: 2  . Years of education: Not on file  . Highest education level: Not on file  Occupational History  . Occupation: Artist  Social Needs  . Financial resource strain: Not on file  . Food insecurity:    Worry: Not on file    Inability: Not on file  . Transportation needs:    Medical: Not on file    Non-medical: Not on file  Tobacco Use  . Smoking status: Never Smoker  . Smokeless tobacco: Never Used  Substance and Sexual Activity  . Alcohol use: No  . Drug use: No  . Sexual activity: Yes  Lifestyle  . Physical activity:    Days per week: Not on file    Minutes per session: Not on file  . Stress: Not on file  Relationships  . Social connections:    Talks on phone: Not on file    Gets together: Not on file    Attends religious service: Not on file    Active member of club or organization: Not on file    Attends meetings of clubs or organizations: Not on file    Relationship  status: Not on file  Other Topics Concern  . Not on file  Social History Narrative   Widowed, wife deceased colon cancer 1996-08-11, remarried 2001, lives with remarried wife   Occupation: retired, worked for coffee Radiographer, therapeutic [employer]   Activity: walking 30-53min 4-5d/wk  Diet: good water, fruits/vegetables daily    Outpatient Encounter Medications as of 05/10/2018  Medication Sig  . aspirin 81 MG tablet Take 81 mg by mouth daily.  . butalbital-acetaminophen-caffeine (FIORICET, ESGIC) 50-325-40 MG tablet Take 1 tablet by mouth every 6 (six) hours as needed.  . carvedilol (COREG) 6.25 MG tablet TAKE 1 TABLET BY MOUTH TWICE DAILY WITH A MEAL  . ezetimibe (ZETIA) 10 MG tablet TAKE 1 TABLET(10 MG) BY MOUTH DAILY  . finasteride (PROSCAR) 5 MG tablet TAKE 1 TABLET BY MOUTH DAILY  . Omega-3 Fatty Acids (FISH OIL) 1200 MG CAPS Take 1,200 mg by mouth daily. Reported on 02/23/2015  . simvastatin (ZOCOR) 80 MG tablet TAKE 1 TABLET(80 MG) BY MOUTH DAILY  . tamsulosin (FLOMAX) 0.4 MG CAPS capsule TAKE 1 CAPSULE BY MOUTH AT BEDTIME  . [DISCONTINUED] cetirizine (ZYRTEC ALLERGY) 10 MG tablet Take 10 mg by mouth daily as needed for allergies.  . [DISCONTINUED] chlorpheniramine-HYDROcodone (TUSSIONEX PENNKINETIC ER) 10-8 MG/5ML SUER Take 5 mLs by mouth at bedtime as needed for cough.   No facility-administered encounter medications on file as of 05/10/2018.     Activities of Daily Living In your present state of health, do you have any difficulty performing the following activities: 05/10/2018  Hearing? N  Vision? N  Difficulty concentrating or making decisions? N  Walking or climbing stairs? N  Dressing or bathing? N  Doing errands, shopping? N  Preparing Food and eating ? N  Using the Toilet? N  In the past six months, have you accidently leaked urine? N  Do you have problems with loss of bowel control? N  Managing your Medications? N  Managing your Finances? N  Housekeeping or managing your  Housekeeping? N  Some recent data might be hidden    Patient Care Team: Ria Bush, MD as PCP - General (Family Medicine)   Assessment:   This is a routine wellness examination for Malakye.   Hearing Screening   125Hz  250Hz  500Hz  1000Hz  2000Hz  3000Hz  4000Hz  6000Hz  8000Hz   Right ear:   40 40 40  40    Left ear:   40 40 40  40    Vision Screening Comments: Vision exam in Oct 2019 with Dr. Milta Deiters B.    Exercise Activities and Dietary recommendations Current Exercise Habits: Home exercise routine, Type of exercise: walking;Other - see comments(chopping wood), Time (Minutes): > 60, Frequency (Times/Week): 4, Weekly Exercise (Minutes/Week): 0, Intensity: Moderate, Exercise limited by: None identified  Goals    . Increase physical activity     Starting 05/10/2018 I will continue to walk 1-2 miles once weekly and to chop wood for 2 hours every other day.        Fall Risk Fall Risk  05/10/2018 03/13/2017 02/24/2016 02/23/2015  Falls in the past year? 0 No No No   Depression Screen PHQ 2/9 Scores 05/10/2018 03/13/2017 02/24/2016 02/23/2015  PHQ - 2 Score 0 0 0 0  PHQ- 9 Score 0 - - -    Cognitive Function MMSE - Mini Mental State Exam 05/10/2018 02/24/2016  Orientation to time 5 5  Orientation to Place 5 5  Registration 3 3  Attention/ Calculation 0 0  Recall 3 3  Language- name 2 objects 0 0  Language- repeat 1 1  Language- follow 3 step command 3 3  Language- read & follow direction 0 0  Write a sentence 0 0  Copy design 0 0  Total score 20 20     PLEASE NOTE:  A Mini-Cog screen was completed. Maximum score is 20. A value of 0 denotes this part of [place] MMSE was not completed or the patient failed this part of the Mini-Cog screening.   Mini-Cog Screening Orientation to Time - Max 5 pts Orientation to Place - Max 5 pts Registration - Max 3 pts Recall - Max 3 pts Language Repeat - Max 1 pts Language Follow 3 Step Command - Max 3 pts     Immunization History    Administered Date(s) Administered  . Influenza, High Dose Seasonal PF 02/19/2017, 12/21/2017  . Influenza,inj,Quad PF,6+ Mos 02/24/2016  . Pneumococcal Conjugate-13 02/23/2015  . Pneumococcal Polysaccharide-23 02/24/2016  . Td 02/25/2003  . Tdap 08/28/2013, 02/04/2015    Screening Tests Health Maintenance  Topic Date Due  . DTaP/Tdap/Td (3 - Td) 02/03/2025  . TETANUS/TDAP  02/03/2025  . COLONOSCOPY  06/29/2026  . INFLUENZA VACCINE  Completed  . Hepatitis C Screening  Completed  . PNA vac Low Risk Adult  Completed      Plan:     I have personally reviewed, addressed, and noted the following in the patient's chart:  A. Medical and social history B. Use of alcohol, tobacco or illicit drugs  C. Current medications and supplements D. Functional ability and status E.  Nutritional status F.  Physical activity G. Advance directives H. List of other physicians I.  Hospitalizations, surgeries, and ER visits in previous 12 months J.  [place] to include hearing, vision, cognitive, depression L. Referrals and appointments - none  In addition, I have reviewed and discussed with patient certain preventive protocols, quality metrics, and best practice recommendations. A written personalized care plan for preventive services as well as general preventive health recommendations were provided to patient.  See attached scanned questionnaire for additional information.   Signed,   Lindell Noe, MHA, BS, LPN Health Coach

## 2018-05-12 ENCOUNTER — Encounter: Payer: Self-pay | Admitting: Family Medicine

## 2018-05-12 NOTE — Progress Notes (Signed)
BP 124/80 (BP Location: Left Arm, Patient Position: Sitting, Cuff Size: Normal)   Pulse 61   Temp 97.6 F (36.4 C) (Oral)   Ht 5' 11.5" (1.816 m)   Wt 226 lb 1 oz (102.5 kg)   SpO2 96%   BMI 31.09 kg/m    CC: CPE Subjective:    Patient ID: Alec Maldonado, male    DOB: Jul 17, 1950, 68 y.o.   MRN: [id number]  HPI: Alec Maldonado is a 68 y.o. male presenting on 05/13/2018 for Annual Exam (Pt 2. )    Saw Katha Cabal last week for medicare wellness visit. Note reviewed.    R lateral foot pain at pinky toe present for 6 months. Feels like blister but skin intact.   Preventative: COLONOSCOPY 06/2016 diverticulosis, rpt 10 yrs Ardis Hughs) Prostate screen yearly. Denies BPH sxs.  Flu yearly Tdap 2015 again 02/2015 Pneumovax 2017, prevnar 2016 shingrix - declines Advanced directive discussion - scanned in chart 03/2017. Wife Hoyle Sauer would be HCPOA, then son Marjory Lies and daughter Tilda Burrow. Would want HCPOA to make end of life decisions.  Seat belt use discussed Sunscreen use discussed. S/p BCC removed from back 2018 - Dr Ronnald Ramp.  Non smoker Alcohol - none Dentist q6 mo Eye exam yearly   Widowed, wife deceased colon cancer 1996-07-31, remarried 2001, lives with remarried wife  Occupation: retired, prior Insurance underwriter for coffee Radiographer, therapeutic [employer] Activity: walking 30-16min 3d/wk, cuts wood Diet: good water, fruits/vegetables daily     Relevant past medical, surgical, family and social history reviewed and updated as indicated. Interim medical history since our last visit reviewed. Allergies and medications reviewed and updated. Outpatient Medications Prior to Visit  Medication Sig Dispense Refill  . aspirin 81 MG tablet Take 81 mg by mouth daily.    . butalbital-acetaminophen-caffeine (FIORICET, ESGIC) 50-325-40 MG tablet Take 1 tablet by mouth every 6 (six) hours as needed. 30 tablet 1  . Omega-3 Fatty Acids (FISH OIL) 1200 MG CAPS Take 1,200 mg by mouth daily. Reported on 02/23/2015    . carvedilol  (COREG) 6.25 MG tablet TAKE 1 TABLET BY MOUTH TWICE DAILY WITH A MEAL 180 tablet 3  . ezetimibe (ZETIA) 10 MG tablet TAKE 1 TABLET(10 MG) BY MOUTH DAILY 90 tablet 0  . finasteride (PROSCAR) 5 MG tablet TAKE 1 TABLET BY MOUTH DAILY 90 tablet 3  . simvastatin (ZOCOR) 80 MG tablet TAKE 1 TABLET(80 MG) BY MOUTH DAILY 90 tablet 0  . tamsulosin (FLOMAX) 0.4 MG CAPS capsule TAKE 1 CAPSULE BY MOUTH AT BEDTIME 90 capsule 3   No facility-administered medications prior to visit.      Per HPI unless specifically indicated in ROS section below Review of Systems  Constitutional: Negative for activity change, appetite change, chills, fatigue, fever and unexpected weight change.  HENT: Negative for hearing loss.   Eyes: Negative for visual disturbance.  Respiratory: Negative for cough, chest tightness, shortness of breath and wheezing.   Cardiovascular: Negative for chest pain, palpitations and leg swelling.  Gastrointestinal: Negative for abdominal distention, abdominal pain, blood in stool, constipation, diarrhea, nausea and vomiting.  Genitourinary: Negative for difficulty urinating and hematuria.  Musculoskeletal: Negative for arthralgias, myalgias and neck pain.  Skin: Negative for rash.  Neurological: Negative for dizziness, seizures, syncope and headaches.  Hematological: Negative for adenopathy. Does not bruise/bleed easily.  Psychiatric/Behavioral: Negative for dysphoric mood. The patient is not nervous/anxious.    Objective:    BP 124/80 (BP Location: Left Arm, Patient Position: Sitting, Cuff Size: Normal)  Pulse 61   Temp 97.6 F (36.4 C) (Oral)   Ht 5' 11.5" (1.816 m)   Wt 226 lb 1 oz (102.5 kg)   SpO2 96%   BMI 31.09 kg/m   Wt Readings from Last 3 Encounters:  05/13/18 226 lb 1 oz (102.5 kg)  05/10/18 222 lb 4.8 oz (100.8 kg)  03/15/18 219 lb 8 oz (99.6 kg)    Physical Exam Vitals signs and nursing note reviewed.  Constitutional:      General: He is not in acute distress.     Appearance: Normal appearance. He is well-developed. He is not ill-appearing.  HENT:     Head: Normocephalic and atraumatic.     Right Ear: Hearing, tympanic membrane, ear canal and external ear normal.     Left Ear: Hearing, tympanic membrane, ear canal and external ear normal.     Nose: Nose normal. No congestion.     Mouth/Throat:     Mouth: Mucous membranes are moist.     Pharynx: Uvula midline. No oropharyngeal exudate or posterior oropharyngeal erythema.  Eyes:     General: No scleral icterus.    Conjunctiva/sclera: Conjunctivae normal.     Pupils: Pupils are equal, round, and reactive to light.  Neck:     Musculoskeletal: Normal range of motion and neck supple.     Vascular: No carotid bruit.  Cardiovascular:     Rate and Rhythm: Normal rate and regular rhythm.     Pulses: Normal pulses.          Radial pulses are 2+ on the right side and 2+ on the left side.     Heart sounds: Normal heart sounds. No murmur.  Pulmonary:     Effort: Pulmonary effort is normal. No respiratory distress.     Breath sounds: Normal breath sounds. No wheezing, rhonchi or rales.  Abdominal:     General: Bowel sounds are normal. There is no distension.     Palpations: Abdomen is soft. There is no mass.     Tenderness: There is no abdominal tenderness. There is no guarding or rebound.  Musculoskeletal: Normal range of motion.     Comments: No abnormality noted to pinky toe on right  Lymphadenopathy:     Cervical: No cervical adenopathy.  Skin:    General: Skin is warm and dry.     Findings: No rash.  Neurological:     General: No focal deficit present.     Mental Status: He is alert and oriented to person, place, and time.     Comments: CN grossly intact, station and gait intact  Psychiatric:        Behavior: Behavior normal.        Thought Content: Thought content normal.        Judgment: Judgment normal.       Results for orders placed or performed in visit on 05/10/18  Lipid Panel    Result Value Ref Range   Cholesterol 126 0 - 200 mg/dL   Triglycerides 130.0 0.0 - 149.0 mg/dL   HDL 34.40 (L) >39.00 mg/dL   VLDL 26.0 0.0 - 40.0 mg/dL   LDL Cholesterol 66 0 - 99 mg/dL   Total CHOL/HDL Ratio 4    NonHDL 91.96   PSA  Result Value Ref Range   PSA 0.78 0.10 - 4.00 ng/mL  CBC with Differential/Platelet  Result Value Ref Range   WBC 7.7 4.0 - 10.5 K/uL   RBC 4.96 4.22 - 5.81 Mil/uL  Hemoglobin 13.7 13.0 - 17.0 g/dL   HCT 40.7 39.0 - 52.0 %   MCV 82.2 78.0 - 100.0 fl   MCHC 33.6 30.0 - 36.0 g/dL   RDW 14.6 11.5 - 15.5 %   Platelets 295.0 150.0 - 400.0 K/uL   Neutrophils Relative % 62.2 43.0 - 77.0 %   Lymphocytes Relative 25.7 12.0 - 46.0 %   Monocytes Relative 9.5 3.0 - 12.0 %   Eosinophils Relative 1.8 0.0 - 5.0 %   Basophils Relative 0.8 0.0 - 3.0 %   Neutro Abs 4.8 1.4 - 7.7 K/uL   Lymphs Abs 2.0 0.7 - 4.0 K/uL   Monocytes Absolute 0.7 0.1 - 1.0 K/uL   Eosinophils Absolute 0.1 0.0 - 0.7 K/uL   Basophils Absolute 0.1 0.0 - 0.1 K/uL  Comprehensive metabolic panel  Result Value Ref Range   Sodium 139 135 - 145 mEq/L   Potassium 4.4 3.5 - 5.1 mEq/L   Chloride 105 96 - 112 mEq/L   CO2 28 19 - 32 mEq/L   Glucose, Bld 85 70 - 99 mg/dL   BUN 17 6 - 23 mg/dL   Creatinine, Ser 1.10 0.40 - 1.50 mg/dL   Total Bilirubin 0.7 0.2 - 1.2 mg/dL   Alkaline Phosphatase 62 39 - 117 U/L   AST 19 0 - 37 U/L   ALT 25 0 - 53 U/L   Total Protein 6.9 6.0 - 8.3 g/dL   Albumin 4.6 3.5 - 5.2 g/dL   Calcium 9.5 8.4 - 10.5 mg/dL   GFR 66.48 >60.00 mL/min   Assessment & Plan:   Problem List Items Addressed This Visit    Toe pain, right    Benign exam. Will watch for now.  Offered, declines podiatry referral. Some callus distal to but not overlying area of tenderness      Status post mitral valve repair    No murmur appreciated      Migraines    Rare - better since retired. Finds triggers are weather changes.       Relevant Medications   carvedilol (COREG) 6.25 MG  tablet   ezetimibe (ZETIA) 10 MG tablet   simvastatin (ZOCOR) 80 MG tablet   Health maintenance examination - Primary    Preventative protocols reviewed and updated unless pt declined. Discussed healthy diet and lifestyle.       Dyslipidemia    Chronic, stable. Continue current regimen.  The ASCVD Risk score Mikey Bussing DC Jr., et al., 2013) failed to calculate for the following reasons:   The valid total cholesterol range is 130 to 320 mg/dL       Relevant Medications   ezetimibe (ZETIA) 10 MG tablet   simvastatin (ZOCOR) 80 MG tablet   Benign prostatic hyperplasia    Chronic, stable on current regimen - continue finasteride/flomax. Largely asxs.       Relevant Medications   finasteride (PROSCAR) 5 MG tablet   tamsulosin (FLOMAX) 0.4 MG CAPS capsule   Advanced care planning/counseling discussion    Advanced directive discussion - scanned in chart 03/2017. Wife Hoyle Sauer would be HCPOA, then son Marjory Lies and daughter Tilda Burrow. Would want HCPOA to make end of life decisions.           Meds ordered this encounter  Medications  . carvedilol (COREG) 6.25 MG tablet    Sig: Take 1 tablet (6.25 mg total) by mouth 2 (two) times daily with a meal.    Dispense:  180 tablet    Refill:  3  .  ezetimibe (ZETIA) 10 MG tablet    Sig: TAKE 1 TABLET(10 MG) BY MOUTH DAILY    Dispense:  90 tablet    Refill:  3  . finasteride (PROSCAR) 5 MG tablet    Sig: Take 1 tablet (5 mg total) by mouth daily.    Dispense:  90 tablet    Refill:  3  . simvastatin (ZOCOR) 80 MG tablet    Sig: TAKE 1 TABLET(80 MG) BY MOUTH DAILY    Dispense:  90 tablet    Refill:  3  . tamsulosin (FLOMAX) 0.4 MG CAPS capsule    Sig: Take 1 capsule (0.4 mg total) by mouth at bedtime.    Dispense:  90 capsule    Refill:  3   No orders of the defined types were placed in this encounter.   Follow up plan: Return in about 1 year (around 05/13/2019) for annual exam, prior fasting for blood work, medicare wellness visit.  Ria Bush, MD

## 2018-05-13 ENCOUNTER — Encounter: Payer: Self-pay | Admitting: Family Medicine

## 2018-05-13 ENCOUNTER — Ambulatory Visit (INDEPENDENT_AMBULATORY_CARE_PROVIDER_SITE_OTHER): Payer: Medicare Other | Admitting: Family Medicine

## 2018-05-13 VITALS — BP 124/80 | HR 61 | Temp 97.6°F | Ht 71.5 in | Wt 226.1 lb

## 2018-05-13 DIAGNOSIS — Z9889 Other specified postprocedural states: Secondary | ICD-10-CM

## 2018-05-13 DIAGNOSIS — E785 Hyperlipidemia, unspecified: Secondary | ICD-10-CM | POA: Diagnosis not present

## 2018-05-13 DIAGNOSIS — Z7189 Other specified counseling: Secondary | ICD-10-CM

## 2018-05-13 DIAGNOSIS — G43909 Migraine, unspecified, not intractable, without status migrainosus: Secondary | ICD-10-CM | POA: Diagnosis not present

## 2018-05-13 DIAGNOSIS — Z0000 Encounter for general adult medical examination without abnormal findings: Secondary | ICD-10-CM

## 2018-05-13 DIAGNOSIS — M79674 Pain in right toe(s): Secondary | ICD-10-CM

## 2018-05-13 DIAGNOSIS — N400 Enlarged prostate without lower urinary tract symptoms: Secondary | ICD-10-CM

## 2018-05-13 MED ORDER — FINASTERIDE 5 MG PO TABS: 5.0000 mg | ORAL_TABLET | Freq: Every day | ORAL | 3 refills | Status: DC

## 2018-05-13 MED ORDER — CARVEDILOL 6.25 MG PO TABS: 6.2500 mg | ORAL_TABLET | Freq: Two times a day (BID) | ORAL | 3 refills | Status: DC

## 2018-05-13 MED ORDER — SIMVASTATIN 80 MG PO TABS: ORAL_TABLET | ORAL | 3 refills | Status: DC

## 2018-05-13 MED ORDER — TAMSULOSIN HCL 0.4 MG PO CAPS: 0.4000 mg | ORAL_CAPSULE | Freq: Every day | ORAL | 3 refills | Status: DC

## 2018-05-13 MED ORDER — EZETIMIBE 10 MG PO TABS: ORAL_TABLET | ORAL | 3 refills | Status: DC

## 2018-05-13 NOTE — Assessment & Plan Note (Signed)
Chronic, stable. Continue current regimen. The ASCVD Risk score (Goff DC Jr., et al., 2013) failed to calculate for the following reasons:   The valid total cholesterol range is 130 to 320 mg/dL  

## 2018-05-13 NOTE — Assessment & Plan Note (Addendum)
Chronic, stable on current regimen - continue finasteride/flomax. Largely asxs.

## 2018-05-13 NOTE — Patient Instructions (Addendum)
If interested, check with pharmacy about new 2 shot shingles series (shingrix).  You are doing well today Return as needed or in 1 year for next physical.  Health Maintenance After Age 69 After age 20, you are at a higher risk for certain long-term diseases and infections as well as injuries from falls. Falls are a major cause of broken bones and head injuries in people who are older than age 41. Getting regular preventive care can help to keep you healthy and well. Preventive care includes getting regular testing and making lifestyle changes as recommended by your health care provider. Talk with your health care provider about:  Which screenings and tests you should have. A screening is a test that checks for a disease when you have no symptoms.  A diet and exercise plan that is right for you. What should I know about screenings and tests to prevent falls? Screening and testing are the best ways to find a health problem early. Early diagnosis and treatment give you the best chance of managing medical conditions that are common after age 4. Certain conditions and lifestyle choices may make you more likely to have a fall. Your health care provider may recommend:  Regular vision checks. Poor vision and conditions such as cataracts can make you more likely to have a fall. If you wear glasses, make sure to get your prescription updated if your vision changes.  Medicine review. Work with your health care provider to regularly review all of the medicines you are taking, including over-the-counter medicines. Ask your health care provider about any side effects that may make you more likely to have a fall. Tell your health care provider if any medicines that you take make you feel dizzy or sleepy.  Osteoporosis screening. Osteoporosis is a condition that causes the bones to get weaker. This can make the bones weak and cause them to break more easily.  Blood pressure screening. Blood pressure changes and  medicines to control blood pressure can make you feel dizzy.  Strength and balance checks. Your health care provider may recommend certain tests to check your strength and balance while standing, walking, or changing positions.  Foot health exam. Foot pain and numbness, as well as not wearing proper footwear, can make you more likely to have a fall.  Depression screening. You may be more likely to have a fall if you have a fear of falling, feel emotionally low, or feel unable to do activities that you used to do.  Alcohol use screening. Using too much alcohol can affect your balance and may make you more likely to have a fall. What actions can I take to lower my risk of falls? General instructions  Talk with your health care provider about your risks for falling. Tell your health care provider if: ? You fall. Be sure to tell your health care provider about all falls, even ones that seem minor. ? You feel dizzy, sleepy, or off-balance.  Take over-the-counter and prescription medicines only as told by your health care provider. These include any supplements.  Eat a healthy diet and maintain a healthy weight. A healthy diet includes low-fat dairy products, low-fat (lean) meats, and fiber from whole grains, beans, and lots of fruits and vegetables. Home safety  Remove any tripping hazards, such as rugs, cords, and clutter.  Install safety equipment such as grab bars in bathrooms and safety rails on stairs.  Keep rooms and walkways well-lit. Activity   Follow a regular exercise program to  stay fit. This will help you maintain your balance. Ask your health care provider what types of exercise are appropriate for you.  If you need a cane or walker, use it as recommended by your health care provider.  Wear supportive shoes that have nonskid soles. Lifestyle  Do not drink alcohol if your health care provider tells you not to drink.  If you drink alcohol, limit how much you have: ? 0-1  drink a day for women. ? 0-2 drinks a day for men.  Be aware of how much alcohol is in your drink. In the [place] one drink equals one typical bottle of beer (12 oz), one-half glass of wine (5 oz), or one shot of hard liquor (1 oz).  Do not use any products that contain nicotine or tobacco, such as cigarettes and e-cigarettes. If you need help quitting, ask your health care provider. Summary  Having a healthy lifestyle and getting preventive care can help to protect your health and wellness after age 47.  Screening and testing are the best way to find a health problem early and help you avoid having a fall. Early diagnosis and treatment give you the best chance for managing medical conditions that are more common for people who are older than age 39.  Falls are a major cause of broken bones and head injuries in people who are older than age 79. Take precautions to prevent a fall at home.  Work with your health care provider to learn what changes you can make to improve your health and wellness and to prevent falls. This information is not intended to replace advice given to you by your health care provider. Make sure you discuss any questions you have with your health care provider. Document Released: 01/03/2017 Document Revised: 01/03/2017 Document Reviewed: 01/03/2017 Elsevier Interactive Patient Education  2019 [employer].

## 2018-05-13 NOTE — Assessment & Plan Note (Addendum)
Benign exam. Will watch for now.  Offered, declines podiatry referral. Some callus distal to but not overlying area of tenderness

## 2018-05-13 NOTE — Assessment & Plan Note (Signed)
No murmur appreciated.  

## 2018-05-13 NOTE — Assessment & Plan Note (Signed)
Preventative protocols reviewed and updated unless pt declined. Discussed healthy diet and lifestyle.  

## 2018-05-13 NOTE — Assessment & Plan Note (Signed)
Rare - better since retired. Finds triggers are weather changes.

## 2018-05-13 NOTE — Assessment & Plan Note (Signed)
Advanced directive discussion - scanned in chart 03/2017. Wife Hoyle Sauer would be HCPOA, then son Marjory Lies and daughter Tilda Burrow. Would want HCPOA to make end of life decisions.

## 2018-07-30 NOTE — Progress Notes (Signed)
I reviewed health advisor's note, was available for consultation, and agree with documentation and plan.  

## 2019-01-21 ENCOUNTER — Ambulatory Visit: Payer: Self-pay

## 2019-01-21 ENCOUNTER — Other Ambulatory Visit: Payer: Self-pay

## 2019-01-21 ENCOUNTER — Ambulatory Visit: Payer: Medicare Other | Admitting: Orthopaedic Surgery

## 2019-01-21 ENCOUNTER — Encounter: Payer: Self-pay | Admitting: Orthopaedic Surgery

## 2019-01-21 VITALS — BP 134/81 | HR 67 | Ht 70.5 in | Wt 220.0 lb

## 2019-01-21 DIAGNOSIS — M25562 Pain in left knee: Secondary | ICD-10-CM | POA: Insufficient documentation

## 2019-01-21 DIAGNOSIS — M5442 Lumbago with sciatica, left side: Secondary | ICD-10-CM

## 2019-01-21 DIAGNOSIS — G8929 Other chronic pain: Secondary | ICD-10-CM | POA: Diagnosis not present

## 2019-01-21 DIAGNOSIS — M5441 Lumbago with sciatica, right side: Secondary | ICD-10-CM | POA: Diagnosis not present

## 2019-01-21 MED ORDER — METHYLPREDNISOLONE ACETATE 40 MG/ML IJ SUSP
80.0000 mg | INTRAMUSCULAR | Status: AC | PRN
  Administered 2019-01-21: 80 mg via INTRA_ARTICULAR

## 2019-01-21 MED ORDER — BUPIVACAINE HCL 0.5 % IJ SOLN
2.0000 mL | INTRAMUSCULAR | Status: AC | PRN
  Administered 2019-01-21: 17:00:00 2 mL via INTRA_ARTICULAR

## 2019-01-21 MED ORDER — LIDOCAINE HCL 1 % IJ SOLN
2.0000 mL | INTRAMUSCULAR | Status: AC | PRN
Start: 2019-01-21 — End: 2019-01-21
  Administered 2019-01-21: 17:00:00 2 mL

## 2019-01-21 NOTE — Progress Notes (Signed)
Office Visit Note   Patient: Alec Maldonado           Date of Birth: [birth year]           MRN: [id number] Visit Date: 01/21/2019              Requested by: Ria Bush, MD [address] [place],   [address] PCP: Ria Bush, MD   Assessment & Plan: Visit Diagnoses:  1. Chronic pain of left knee   2. Chronic bilateral low back pain with bilateral sciatica     Plan: Recurrent left knee pain.  Did well with cortisone injection in the past.  Will reinject.  Consider MRI scan if no improvement with the possibility of a tear of the posterior horn of the medial meniscus  Follow-Up Instructions: Return if symptoms worsen or fail to improve.   Orders:  Orders Placed This Encounter  Procedures  . Large Joint Inj: L knee  . XR KNEE 3 VIEW LEFT  . Epidural Steroid Injection - Lumbar/Sacral (Ancillary Performed)   No orders of the defined types were placed in this encounter.     Procedures: Large Joint Inj: L knee on 01/21/2019 5:13 PM Indications: pain and diagnostic evaluation Details: 25 G 1.5 in needle, anteromedial approach  Arthrogram: No  Medications: 2 mL lidocaine 1 %; 2 mL bupivacaine 0.5 %; 80 mg methylPREDNISolone acetate 40 MG/ML Procedure, treatment alternatives, risks and benefits explained, specific risks discussed. Consent was given by the patient. Patient was prepped and draped in the usual sterile fashion.       Clinical Data: No additional findings.   Subjective: Chief Complaint  Patient presents with  . Left Knee - Pain  Patient presents today for recurrent left knee pain. It has been hurting for 6 months. No known injury. The pain is located medially. Steps makes it worse, but the pain is present if his knee is in "the wrong position". He is taking Advil and Theraworx. No swelling. He has noticed that at times it pops, but not often. No grinding, catching, or giving way.   HPI  Review of Systems  Constitutional: Negative for  fatigue.  HENT: Negative for ear pain.   Eyes: Negative for pain.  Respiratory: Negative for shortness of breath.   Cardiovascular: Negative for leg swelling.  Gastrointestinal: Negative for constipation and diarrhea.  Endocrine: Negative for cold intolerance and heat intolerance.  Genitourinary: Negative for difficulty urinating.  Musculoskeletal: Negative for joint swelling.  Skin: Negative for rash.  Allergic/Immunologic: Negative for food allergies.  Neurological: Negative for weakness.  Hematological: Does not bruise/bleed easily.  Psychiatric/Behavioral: Positive for sleep disturbance.     Objective: Vital Signs: BP 134/81   Pulse 67   Ht 5' 10.5" (1.791 m)   Wt 220 lb (99.8 kg)   BMI 31.12 kg/m   Physical Exam Constitutional:      Appearance: He is well-developed.  Eyes:     Pupils: Pupils are equal, round, and reactive to light.  Pulmonary:     Effort: Pulmonary effort is normal.  Skin:    General: Skin is warm and dry.  Neurological:     Mental Status: He is alert and oriented to person, place, and time.  Psychiatric:        Behavior: Behavior normal.     Ortho Exam awake alert and oriented x3.  Comfortable sitting.  No specific pain about the left knee today and specifically none along the medial compartment.  No  effusion no patellar crepitation.  No lateral joint pain.  Specialty Comments:  No specialty comments available.  Imaging: Xr Knee 3 View Left  Result Date: 01/21/2019 Films of the left knee are obtained in several projections standing.  Joint spaces are well-maintained.  No obvious subchondral sclerosis or cyst formation.  No ectopic calcification.  No acute changes minimal osteophytes about the patellofemoral joint without joint space narrowing.  Films are consistent with probable mild osteoarthritis    PMFS History: Patient Active Problem List   Diagnosis Date Noted  . Pain in left knee 01/21/2019  . Toe pain, right 05/13/2018  .  Medicare annual wellness visit, subsequent 02/23/2015  . Advanced care planning/counseling discussion 02/23/2015  . Health maintenance examination 08/28/2013  . SVT (supraventricular tachycardia) ([place]) 03/11/2013  . Dysplastic nevus of trunk 08/20/2012  . Aortic insufficiency   . Right ventricular dysfunction   . RBBB   . Status post mitral valve repair   . Chest discomfort   . Mitral regurgitation   . Migraines   . Exertional dyspnea 12/14/2008  . Dyslipidemia 12/13/2008  . Benign prostatic hyperplasia 05/10/2007   Past Medical History:  Diagnosis Date  . Allergy   . Aortic insufficiency    Mild, echo, January, 2014  . BCC (basal cell carcinoma), back 2017   s/p excisional biospy by derm  . BPH (benign prostatic hypertrophy)   . Chest discomfort    nuclear 06/24/09, suggestion prior small inferior MI with moderate peri-infarvt ischemia versus variable diaphragmatic attenuation  . Colon polyps    colonoscopy 07/02/06, repeat in 10 years  . Dyslipidemia   . Ejection fraction    EF 55%, echo, October, 2010  //   EF 45-50%,  catheterization, 2011  . Heart murmur   . Hyperlipidemia   . Migraines   . Mitral regurgitation    Mitral valve repair 2001  . Neurofibroma of foot 04/2017   s/p removal by derm  . Palpitations 07/2009    helped with carvedilol   . Pleurisy   . Postural dizziness    mild, no orthostatic changes by blood pressure check  . Pre-syncope    January, 2014  . RBBB    First seen January, 2014  . Right ventricular dysfunction    Mild/moderate right ventricular dysfunction by echo, January, 2014, no tricuspid regurgitation, therefore right heart pressure could not be estimated.  . Shortness of breath   . Status post mitral valve repair    2001,  //  echo, October, 2010, good function of the repaired mitral valve  . SVT (supraventricular tachycardia) ([place]) 03/11/2013   Isolated episodes - last 03/2013 after flu treated with adenosine cardioversion in ER.   Marland Kitchen  Thyroid disease     Family History  Problem Relation Age of Onset  . Alzheimer's disease Mother   . Heart disease Mother        CHF  . Arthritis Father   . Heart disease Father        leaking mitral valve  . Benign prostatic hyperplasia Father   . Heart disease Brother        murmur  . Hypertension Neg Hx   . Diabetes Neg Hx   . Depression Neg Hx   . Alcohol abuse Neg Hx   . Drug abuse Neg Hx   . Stroke Neg Hx   . Cancer Neg Hx   . Colon cancer Neg Hx   . Esophageal cancer Neg Hx   . Rectal cancer  Neg Hx   . Stomach cancer Neg Hx     Past Surgical History:  Procedure Laterality Date  . aneurysmal dilatation     proximal LAD cath 2001, reoeat catheter 2004, no significant change  . aneurysmal dilatiation  07/15/09   no change, normal right heart filling pressures and LVEDP- EF 45-50%  . APPENDECTOMY  1970  . CARDIAC CATHETERIZATION  2010   mild global LV dysfunction EF 45-50%, no significant CAD obstruction, aneurysmal dilatation LAD  . COLONOSCOPY  2008  . COLONOSCOPY  06/2016   diverticulosis, rpt 10 yrs Ardis Hughs)  . CORONARY ANGIOPLASTY  09/1999   severe MR o/w normal limits  . CORONARY ANGIOPLASTY  12/19/02   pos. CAD, mild LV dysfunction- med treatment  . EYE SURGERY Left   . HERNIA REPAIR  01/11/05   left, Dr. Hassell Done  . I&D EXTREMITY Right 02/04/2015   Procedure: IRRIGATION AND DEBRIDEMENT RIGHT INDEX FINGER OPEN FRACTURE AND REPAIR DIGITAL NERVE AND ARTERY;  Surgeon: Leanora Cover, MD;  Location: [facility];  Service: Orthopedics;  Laterality: Right;  . MITRAL VALVE REPAIR  01/22/00   good mitral valve function- echo 12/2008- EF 55% / nuclear 06/2009- EF 36%,? reliable? / EF 45-50%, cath rated 02/2010  . ORIF ELBOW FRACTURE     left  . POLYPECTOMY    . THYROID SURGERY    . THYROIDECTOMY     left, due to goiter 1994   Social History   Occupational History  . Occupation: Artist  Tobacco Use  . Smoking status: Never Smoker  . Smokeless tobacco: Never Used   Substance and Sexual Activity  . Alcohol use: No  . Drug use: No  . Sexual activity: Yes

## 2019-01-27 ENCOUNTER — Other Ambulatory Visit: Payer: Self-pay

## 2019-01-27 ENCOUNTER — Ambulatory Visit
Admission: RE | Admit: 2019-01-27 | Discharge: 2019-01-27 | Disposition: A | Discharge status: Home / Self Care | Payer: Medicare Other | Source: Ambulatory Visit | Attending: Orthopaedic Surgery | Admitting: Orthopaedic Surgery

## 2019-01-27 DIAGNOSIS — M5442 Lumbago with sciatica, left side: Secondary | ICD-10-CM

## 2019-01-27 DIAGNOSIS — G8929 Other chronic pain: Secondary | ICD-10-CM

## 2019-01-27 DIAGNOSIS — M545 Low back pain: Secondary | ICD-10-CM | POA: Diagnosis not present

## 2019-01-27 MED ORDER — METHYLPREDNISOLONE ACETATE 40 MG/ML INJ SUSP (RADIOLOG
120.0000 mg | Freq: Once | INTRAMUSCULAR | Status: AC
  Administered 2019-01-27: 120 mg via EPIDURAL

## 2019-01-27 MED ORDER — IOPAMIDOL (ISOVUE-M 200) INJECTION 41%
1.0000 mL | Freq: Once | INTRAMUSCULAR | Status: AC
  Administered 2019-01-27: 12:00:00 1 mL via EPIDURAL

## 2019-01-27 NOTE — Discharge Instructions (Signed)

## 2019-01-28 ENCOUNTER — Telehealth: Payer: Self-pay | Admitting: Orthopaedic Surgery

## 2019-01-28 NOTE — Telephone Encounter (Signed)
Ok to schedule MRI of knee

## 2019-01-28 NOTE — Telephone Encounter (Signed)
Patient left a voicemail stating that he felt something pop in his left knee and is in pain.  He is wanting to get an MRI because Dr. Durward Fortes has already x-rayed it and stated that nothing is structurally wrong with it.  CB#[phone].  Thank you.

## 2019-01-28 NOTE — Telephone Encounter (Signed)
Please advise 

## 2019-01-29 ENCOUNTER — Other Ambulatory Visit: Payer: Self-pay | Admitting: Orthopaedic Surgery

## 2019-01-29 DIAGNOSIS — G8929 Other chronic pain: Secondary | ICD-10-CM

## 2019-01-29 DIAGNOSIS — M25562 Pain in left knee: Secondary | ICD-10-CM

## 2019-01-29 NOTE — Telephone Encounter (Signed)
Called patient. No answer. Left message that I have ordered MRI left knee.

## 2019-01-29 NOTE — Telephone Encounter (Signed)
Patient called back and I advised him of the message in regards to his MRI

## 2019-02-10 ENCOUNTER — Telehealth: Payer: Self-pay | Admitting: Orthopaedic Surgery

## 2019-02-12 ENCOUNTER — Telehealth: Payer: Self-pay | Admitting: Orthopaedic Surgery

## 2019-02-12 NOTE — Telephone Encounter (Signed)
Please see below.

## 2019-02-12 NOTE — Telephone Encounter (Signed)
Patient called and asked for Sabrina from Dr. Durward Fortes office to give a call back. Patient phone number is [phone]

## 2019-02-13 NOTE — Telephone Encounter (Signed)
Contacted pt and sw wife and we discussed the appt he has coming up for his mri

## 2019-02-18 ENCOUNTER — Encounter: Payer: Self-pay | Admitting: Orthopaedic Surgery

## 2019-02-18 DIAGNOSIS — M25562 Pain in left knee: Secondary | ICD-10-CM | POA: Diagnosis not present

## 2019-02-25 ENCOUNTER — Ambulatory Visit (INDEPENDENT_AMBULATORY_CARE_PROVIDER_SITE_OTHER): Payer: Medicare Other | Admitting: Orthopaedic Surgery

## 2019-02-25 ENCOUNTER — Other Ambulatory Visit: Payer: Self-pay

## 2019-02-25 ENCOUNTER — Encounter: Payer: Self-pay | Admitting: Orthopaedic Surgery

## 2019-02-25 VITALS — Ht 70.5 in | Wt 220.0 lb

## 2019-02-25 DIAGNOSIS — G8929 Other chronic pain: Secondary | ICD-10-CM | POA: Diagnosis not present

## 2019-02-25 DIAGNOSIS — M25562 Pain in left knee: Secondary | ICD-10-CM | POA: Diagnosis not present

## 2019-02-25 NOTE — Progress Notes (Signed)
Office Visit Note   Patient: Alec Maldonado           Date of Birth: 1950/04/21           MRN: [id number] Visit Date: 02/25/2019              Requested by: Ria Bush, MD [address] [place],  [place] [address] PCP: Ria Bush, MD   Assessment & Plan: Visit Diagnoses:  1. Chronic pain of left knee     Plan: MRI scan demonstrates an oblique tear of the posterior horn and body junction of the medial meniscus extending into the inferior articular surface and extending into the posterior horn.  Lateral meniscus is intact.  No chondral defects medially or laterally but there is some fissuring with partial-thickness cartilage loss of the medial femoral condyle.  Small Baker's cyst.  No acute osseous abnormalities or aggressive osseous lesions.  Long discussion with Mr. Bergara over about 30 minutes regarding the present problem.  His pain is related to a combination of factors including the arthritis and meniscal tear.  I think the best approach given his chronic pain would be at knee arthroscopy to debride the posterior horn of the medial meniscus.  He is fully aware that he may have some residual pain from the arthritis.  We discussed the surgery the outpatient nature of potential problems he may have postoperatively he would like to proceed sometime after the first of the year  Follow-Up Instructions: Return We will schedule left knee arthroscopy with debridement of the medial meniscal tear.   Orders:  No orders of the defined types were placed in this encounter.  No orders of the defined types were placed in this encounter.     Procedures: No procedures performed   Clinical Data: No additional findings.   Subjective: Chief Complaint  Patient presents with  . Left Knee - Follow-up    MRI results  Patient presents today for follow up on his left knee. He had an MRI and is here today to go over those results. He was seeing improvement after the cortisone  injection until he developed sudden pain while walking across his yard. He said that the pain has decreased some, but still hurting. He is taking Advil as needed.  Pain is localized on the medial compartment of his left knee.  Still having pain with certain activities.  HPI  Review of Systems  Constitutional: Negative for fatigue.  HENT: Negative for ear pain.   Eyes: Negative for pain.  Respiratory: Negative for shortness of breath.   Cardiovascular: Negative for leg swelling.  Gastrointestinal: Negative for constipation and diarrhea.  Endocrine: Negative for cold intolerance and heat intolerance.  Genitourinary: Negative for difficulty urinating.  Musculoskeletal: Negative for joint swelling.  Skin: Negative for rash.  Allergic/Immunologic: Negative for food allergies.  Neurological: Negative for weakness.  Hematological: Does not bruise/bleed easily.  Psychiatric/Behavioral: Negative for sleep disturbance.     Objective: Vital Signs: Ht 5' 10.5" (1.791 m)   Wt 220 lb (99.8 kg)   BMI 31.12 kg/m   Physical Exam Constitutional:      Appearance: He is well-developed.  Eyes:     Pupils: Pupils are equal, round, and reactive to light.  Pulmonary:     Effort: Pulmonary effort is normal.  Skin:    General: Skin is warm and dry.  Neurological:     Mental Status: He is alert and oriented to person, place, and time.  Psychiatric:  Behavior: Behavior normal.     Ortho Exam awake alert and oriented x3.  Comfortable sitting.  Left knee without effusion.  Does have diffuse mild medial joint pain more posteriorly than anteriorly.  No lateral joint pain.  No patella pain.  No instability.  Full extension of flexed over 105 degrees.  Did not feel any popliteal mass.  No popliteal pain.  No distal edema.  No crepitation  Specialty Comments:  No specialty comments available.  Imaging: No results found.   PMFS History: Patient Active Problem List   Diagnosis Date Noted  .  Pain in left knee 01/21/2019  . Toe pain, right 05/13/2018  . Medicare annual wellness visit, subsequent 02/23/2015  . Advanced care planning/counseling discussion 02/23/2015  . Health maintenance examination 08/28/2013  . SVT (supraventricular tachycardia) ([place]) 03/11/2013  . Dysplastic nevus of trunk 08/20/2012  . Aortic insufficiency   . Right ventricular dysfunction   . RBBB   . Status post mitral valve repair   . Chest discomfort   . Mitral regurgitation   . Migraines   . Exertional dyspnea 12/14/2008  . Dyslipidemia 12/13/2008  . Benign prostatic hyperplasia 05/10/2007   Past Medical History:  Diagnosis Date  . Allergy   . Aortic insufficiency    Mild, echo, January, 2014  . BCC (basal cell carcinoma), back 2017   s/p excisional biospy by derm  . BPH (benign prostatic hypertrophy)   . Chest discomfort    nuclear 06/24/09, suggestion prior small inferior MI with moderate peri-infarvt ischemia versus variable diaphragmatic attenuation  . Colon polyps    colonoscopy 07/02/06, repeat in 10 years  . Dyslipidemia   . Ejection fraction    EF 55%, echo, October, 2010  //   EF 45-50%,  catheterization, 2011  . Heart murmur   . Hyperlipidemia   . Migraines   . Mitral regurgitation    Mitral valve repair 2001  . Neurofibroma of foot 04/2017   s/p removal by derm  . Palpitations 07/2009    helped with carvedilol   . Pleurisy   . Postural dizziness    mild, no orthostatic changes by blood pressure check  . Pre-syncope    January, 2014  . RBBB    First seen January, 2014  . Right ventricular dysfunction    Mild/moderate right ventricular dysfunction by echo, January, 2014, no tricuspid regurgitation, therefore right heart pressure could not be estimated.  . Shortness of breath   . Status post mitral valve repair    2001,  //  echo, October, 2010, good function of the repaired mitral valve  . SVT (supraventricular tachycardia) ([place]) 03/11/2013   Isolated episodes - last  03/2013 after flu treated with adenosine cardioversion in ER.   Marland Kitchen Thyroid disease     Family History  Problem Relation Age of Onset  . Alzheimer's disease Mother   . Heart disease Mother        CHF  . Arthritis Father   . Heart disease Father        leaking mitral valve  . Benign prostatic hyperplasia Father   . Heart disease Brother        murmur  . Hypertension Neg Hx   . Diabetes Neg Hx   . Depression Neg Hx   . Alcohol abuse Neg Hx   . Drug abuse Neg Hx   . Stroke Neg Hx   . Cancer Neg Hx   . Colon cancer Neg Hx   . Esophageal cancer Neg  Hx   . Rectal cancer Neg Hx   . Stomach cancer Neg Hx     Past Surgical History:  Procedure Laterality Date  . aneurysmal dilatation     proximal LAD cath 2001, reoeat catheter 2004, no significant change  . aneurysmal dilatiation  07/15/09   no change, normal right heart filling pressures and LVEDP- EF 45-50%  . APPENDECTOMY  1970  . CARDIAC CATHETERIZATION  2010   mild global LV dysfunction EF 45-50%, no significant CAD obstruction, aneurysmal dilatation LAD  . COLONOSCOPY  2008  . COLONOSCOPY  06/2016   diverticulosis, rpt 10 yrs Ardis Hughs)  . CORONARY ANGIOPLASTY  09/1999   severe MR o/w normal limits  . CORONARY ANGIOPLASTY  12/19/02   pos. CAD, mild LV dysfunction- med treatment  . EYE SURGERY Left   . HERNIA REPAIR  01/11/05   left, Dr. Hassell Done  . I & D EXTREMITY Right 02/04/2015   Procedure: IRRIGATION AND DEBRIDEMENT RIGHT INDEX FINGER OPEN FRACTURE AND REPAIR DIGITAL NERVE AND ARTERY;  Surgeon: Leanora Cover, MD;  Location: [facility];  Service: Orthopedics;  Laterality: Right;  . MITRAL VALVE REPAIR  01/22/00   good mitral valve function- echo 12/2008- EF 55% / nuclear 06/2009- EF 36%,? reliable? / EF 45-50%, cath rated 02/2010  . ORIF ELBOW FRACTURE     left  . POLYPECTOMY    . THYROID SURGERY    . THYROIDECTOMY     left, due to goiter 1994   Social History   Occupational History  . Occupation: Artist  Tobacco Use    . Smoking status: Never Smoker  . Smokeless tobacco: Never Used  Substance and Sexual Activity  . Alcohol use: No  . Drug use: No  . Sexual activity: Yes

## 2019-03-07 DIAGNOSIS — I639 Cerebral infarction, unspecified: Secondary | ICD-10-CM

## 2019-03-07 HISTORY — DX: Cerebral infarction, unspecified: I63.9

## 2019-03-13 ENCOUNTER — Other Ambulatory Visit: Payer: Self-pay

## 2019-03-20 ENCOUNTER — Other Ambulatory Visit: Payer: Self-pay | Admitting: Orthopedic Surgery

## 2019-03-20 ENCOUNTER — Telehealth: Payer: Self-pay | Admitting: Orthopaedic Surgery

## 2019-03-20 DIAGNOSIS — S83232A Complex tear of medial meniscus, current injury, left knee, initial encounter: Secondary | ICD-10-CM | POA: Diagnosis not present

## 2019-03-20 DIAGNOSIS — M94262 Chondromalacia, left knee: Secondary | ICD-10-CM | POA: Diagnosis not present

## 2019-03-20 DIAGNOSIS — M6752 Plica syndrome, left knee: Secondary | ICD-10-CM | POA: Diagnosis not present

## 2019-03-20 MED ORDER — OXYCODONE HCL 5 MG PO TABS: 5.0000 mg | ORAL_TABLET | Freq: Four times a day (QID) | ORAL | 0 refills | Status: DC | PRN

## 2019-03-20 NOTE — Telephone Encounter (Signed)
Called-has analgesic from pharmacy

## 2019-03-20 NOTE — Telephone Encounter (Signed)
Patient called. He just had surgery. He would like pain medication called in to his pharmacy. His call back number is [phone]

## 2019-03-20 NOTE — Telephone Encounter (Signed)
Please see below.

## 2019-03-27 ENCOUNTER — Encounter: Payer: Self-pay | Admitting: Orthopaedic Surgery

## 2019-03-27 ENCOUNTER — Other Ambulatory Visit: Payer: Self-pay

## 2019-03-27 ENCOUNTER — Ambulatory Visit: Payer: Medicare Other | Admitting: Orthopaedic Surgery

## 2019-03-27 VITALS — Ht 70.5 in | Wt 220.0 lb

## 2019-03-27 DIAGNOSIS — M25562 Pain in left knee: Secondary | ICD-10-CM

## 2019-03-27 DIAGNOSIS — G8929 Other chronic pain: Secondary | ICD-10-CM

## 2019-03-27 NOTE — Progress Notes (Signed)
Office Visit Note   Patient: Alec Maldonado           Date of Birth: 1950-09-17           MRN: [id number] Visit Date: 03/27/2019              Requested by: Ria Bush, MD [address] [place],  [place] [address] PCP: Ria Bush, MD   Assessment & Plan: Visit Diagnoses:  1. Chronic pain of left knee     Plan: 1 week status post left knee arthroscopy with excision of a complex tear of the posterior horn of the medial meniscus.  Doing exceptionally well with little if any pain and no use of ambulatory aid.  Is not had any fever or chills.  He also had some grade 2 and early grade 3 changes of chondromalacia in the posterior medial compartment involving mostly femur but some on the tibia.  I discussed that with him so that he is aware he may have some problems over time.  Activities as tolerated office 1 month.  We also had a long discussion about the problem is had with his back.  He had an MRI scan of the lumbar spine in late 2019 revealing some degenerative changes but tickly at L4-5 and L5-S1.  X-rays demonstrated significant narrowing of the disc space at L4-5.  He has had several epidural steroid injections the last of which was about 3 months ago and he thinks that it really did not make that much of a difference.  On occasion he has some twitching in his right foot and pain in the middle and to the left of his back.  I had discussed considering EMGs and nerve conduction studies or repeat MRI scan but he would just as soon wait.  Exam was benign  Follow-Up Instructions: Return in about 1 month (around 04/27/2019).   Orders:  No orders of the defined types were placed in this encounter.  No orders of the defined types were placed in this encounter.     Procedures: No procedures performed   Clinical Data: No additional findings.   Subjective: Chief Complaint  Patient presents with  . Left Knee - Routine Post Op    Left knee arthroscopy DOS 03/20/2019    Patient presents today for follow up on his left knee. He had a left knee arthroscopy on 03/20/2019. He is now one week out from surgery. Doing well. No complaints.  HPI  Review of Systems   Objective: Vital Signs: Ht 5' 10.5" (1.791 m)   Wt 220 lb (99.8 kg)   BMI 31.12 kg/m   Physical Exam  Ortho Exam left knee was not hot red warm or swollen and no effusion.  The arthroscopic portals are healed.  Full extension flexed over 100 degrees without instability.  No significant medial or lateral joint pain.  No calf swelling or pain no ankle swelling or pain.  Negative Homan.  Painless range of motion both hips.  Motor exam intact.  Patient relates having normal feeling in both of his feet  Specialty Comments:  No specialty comments available.  Imaging: No results found.   PMFS History: Patient Active Problem List   Diagnosis Date Noted  . Pain in left knee 01/21/2019  . Toe pain, right 05/13/2018  . Medicare annual wellness visit, subsequent 02/23/2015  . Advanced care planning/counseling discussion 02/23/2015  . Health maintenance examination 08/28/2013  . SVT (supraventricular tachycardia) ([place]) 03/11/2013  . Dysplastic nevus  of trunk 08/20/2012  . Aortic insufficiency   . Right ventricular dysfunction   . RBBB   . Status post mitral valve repair   . Chest discomfort   . Mitral regurgitation   . Migraines   . Exertional dyspnea 12/14/2008  . Dyslipidemia 12/13/2008  . Benign prostatic hyperplasia 05/10/2007   Past Medical History:  Diagnosis Date  . Allergy   . Aortic insufficiency    Mild, echo, January, 2014  . BCC (basal cell carcinoma), back 2017   s/p excisional biospy by derm  . BPH (benign prostatic hypertrophy)   . Chest discomfort    nuclear 06/24/09, suggestion prior small inferior MI with moderate peri-infarvt ischemia versus variable diaphragmatic attenuation  . Colon polyps    colonoscopy 07/02/06, repeat in 10 years  . Dyslipidemia   . Ejection  fraction    EF 55%, echo, October, 2010  //   EF 45-50%,  catheterization, 2011  . Heart murmur   . Hyperlipidemia   . Migraines   . Mitral regurgitation    Mitral valve repair 2001  . Neurofibroma of foot 04/2017   s/p removal by derm  . Palpitations 07/2009    helped with carvedilol   . Pleurisy   . Postural dizziness    mild, no orthostatic changes by blood pressure check  . Pre-syncope    January, 2014  . RBBB    First seen January, 2014  . Right ventricular dysfunction    Mild/moderate right ventricular dysfunction by echo, January, 2014, no tricuspid regurgitation, therefore right heart pressure could not be estimated.  . Shortness of breath   . Status post mitral valve repair    2001,  //  echo, October, 2010, good function of the repaired mitral valve  . SVT (supraventricular tachycardia) ([place]) 03/11/2013   Isolated episodes - last 03/2013 after flu treated with adenosine cardioversion in ER.   Marland Kitchen Thyroid disease     Family History  Problem Relation Age of Onset  . Alzheimer's disease Mother   . Heart disease Mother        CHF  . Arthritis Father   . Heart disease Father        leaking mitral valve  . Benign prostatic hyperplasia Father   . Heart disease Brother        murmur  . Hypertension Neg Hx   . Diabetes Neg Hx   . Depression Neg Hx   . Alcohol abuse Neg Hx   . Drug abuse Neg Hx   . Stroke Neg Hx   . Cancer Neg Hx   . Colon cancer Neg Hx   . Esophageal cancer Neg Hx   . Rectal cancer Neg Hx   . Stomach cancer Neg Hx     Past Surgical History:  Procedure Laterality Date  . aneurysmal dilatation     proximal LAD cath 2001, reoeat catheter 2004, no significant change  . aneurysmal dilatiation  07/15/09   no change, normal right heart filling pressures and LVEDP- EF 45-50%  . APPENDECTOMY  1970  . CARDIAC CATHETERIZATION  2010   mild global LV dysfunction EF 45-50%, no significant CAD obstruction, aneurysmal dilatation LAD  . COLONOSCOPY  2008  .  COLONOSCOPY  06/2016   diverticulosis, rpt 10 yrs Ardis Hughs)  . CORONARY ANGIOPLASTY  09/1999   severe MR o/w normal limits  . CORONARY ANGIOPLASTY  12/19/02   pos. CAD, mild LV dysfunction- med treatment  . EYE SURGERY Left   . HERNIA  REPAIR  01/11/05   left, Dr. Hassell Done  . I & D EXTREMITY Right 02/04/2015   Procedure: IRRIGATION AND DEBRIDEMENT RIGHT INDEX FINGER OPEN FRACTURE AND REPAIR DIGITAL NERVE AND ARTERY;  Surgeon: Leanora Cover, MD;  Location: [facility];  Service: Orthopedics;  Laterality: Right;  . MITRAL VALVE REPAIR  01/22/00   good mitral valve function- echo 12/2008- EF 55% / nuclear 06/2009- EF 36%,? reliable? / EF 45-50%, cath rated 02/2010  . ORIF ELBOW FRACTURE     left  . POLYPECTOMY    . THYROID SURGERY    . THYROIDECTOMY     left, due to goiter 1994   Social History   Occupational History  . Occupation: Artist  Tobacco Use  . Smoking status: Never Smoker  . Smokeless tobacco: Never Used  Substance and Sexual Activity  . Alcohol use: No  . Drug use: No  . Sexual activity: Yes

## 2019-04-20 ENCOUNTER — Ambulatory Visit: Payer: Medicare Other | Attending: Internal Medicine

## 2019-04-20 DIAGNOSIS — Z23 Encounter for immunization: Secondary | ICD-10-CM | POA: Insufficient documentation

## 2019-04-20 NOTE — Progress Notes (Signed)
   Covid-19 Vaccination Clinic  Name:  KEYNEN CLEARY    MRN: [id number] DOB: Jun 10, 1950  04/20/2019  Mr. Bandel was observed post Covid-19 immunization for 15 minutes without incidence. He was provided with Vaccine Information Sheet and instruction to access the V-Safe system.   Mr. Feeman was instructed to call 911 with any severe reactions post vaccine: Marland Kitchen Difficulty breathing  . Swelling of your face and throat  . A fast heartbeat  . A bad rash all over your body  . Dizziness and weakness    Immunizations Administered    Name Date Dose VIS Date Route   Pfizer COVID-19 Vaccine 04/20/2019  9:17 AM 0.3 mL 02/14/2019 Intramuscular   Manufacturer: [place]   Lot: [id number]   [place]: [id number]

## 2019-05-06 ENCOUNTER — Other Ambulatory Visit: Payer: Self-pay | Admitting: Family Medicine

## 2019-05-06 ENCOUNTER — Encounter: Payer: Self-pay | Admitting: Orthopaedic Surgery

## 2019-05-06 ENCOUNTER — Ambulatory Visit (INDEPENDENT_AMBULATORY_CARE_PROVIDER_SITE_OTHER): Payer: Medicare Other | Admitting: Orthopaedic Surgery

## 2019-05-06 ENCOUNTER — Other Ambulatory Visit: Payer: Self-pay

## 2019-05-06 DIAGNOSIS — M5431 Sciatica, right side: Secondary | ICD-10-CM

## 2019-05-06 DIAGNOSIS — M1712 Unilateral primary osteoarthritis, left knee: Secondary | ICD-10-CM | POA: Insufficient documentation

## 2019-05-06 DIAGNOSIS — E785 Hyperlipidemia, unspecified: Secondary | ICD-10-CM

## 2019-05-06 DIAGNOSIS — N400 Enlarged prostate without lower urinary tract symptoms: Secondary | ICD-10-CM

## 2019-05-06 NOTE — Progress Notes (Signed)
Office Visit Note   Patient: Alec Maldonado           Date of Birth: 05/25/1950           MRN: [id number] Visit Date: 05/06/2019              Requested by: Ria Bush, MD [address] [place],  [place] [address] PCP: Ria Bush, MD   Assessment & Plan: Visit Diagnoses:  1. Primary osteoarthritis of left knee   2. Sciatica, right side     Plan: #1: At this time he is not interested of having either an epidural or an MRI scan. #2: He will use Voltaren gel on the lumbar spine as well as left knee as needed. #3: If his symptoms worsen then we may need to consider a repeat MRI scan or at least have him see Dr. Ernestina Patches for evaluation possible epidural.  Follow-Up Instructions: Return if symptoms worsen or fail to improve.   Orders:  No orders of the defined types were placed in this encounter.  No orders of the defined types were placed in this encounter.     Procedures: No procedures performed   Clinical Data: No additional findings.   Subjective: Chief Complaint  Patient presents with  . Left Knee - Follow-up    Left knee arthroscopy DOS 03/20/2019  Patient presents today for follow up on his left knee and low back pain. He had a left knee arthroscopy on 03/20/2019. He said that his left knee is doing pretty well, but does have some pain left medial aspect of the knee.Marland Kitchen He said that it hurts anteriorly upon movement. He does not take anything for pain.   His lower back pain is about the same. He still has some involuntary twitching in his right foot second and third toes.  He states overall that his back is essentially unchanged.  He states he is not interested in any epidurals or any treatment at this time.  He does not want to do an MRI scan either.    HPI  As above  Review of Systems  Constitutional: Negative for fatigue.  HENT: Negative for ear pain.   Eyes: Negative for pain.  Respiratory: Negative for shortness of breath.   Cardiovascular:  Negative for leg swelling.  Gastrointestinal: Negative for constipation and diarrhea.  Endocrine: Negative for cold intolerance and heat intolerance.  Genitourinary: Negative for difficulty urinating.  Musculoskeletal: Negative for joint swelling.  Skin: Negative for rash.  Allergic/Immunologic: Negative for food allergies.  Neurological: Negative for weakness.  Hematological: Does not bruise/bleed easily.  Psychiatric/Behavioral: Negative for sleep disturbance.     Objective: Vital Signs: BP 123/71   Pulse 65   Ht 5' 10.5" (1.791 m)   Wt 220 lb (99.8 kg)   BMI 31.12 kg/m   Physical Exam Constitutional:      Appearance: He is well-developed.  Eyes:     Pupils: Pupils are equal, round, and reactive to light.  Pulmonary:     Effort: Pulmonary effort is normal.  Skin:    General: Skin is warm and dry.  Neurological:     Mental Status: He is alert and oriented to person, place, and time.  Psychiatric:        Behavior: Behavior normal.     Ortho Exam  Exam today reveals good range of motion of the left knee from full extension to about 105 degrees of flexion.  Does have some crepitus with range of motion.  Really not much in the way of an effusion.  No warmth or erythema.    In regards to his lower extremities he has good strength bilaterally and and symmetric in the lower extremities.  Deep tendon reflexes were 2+ in the knees absent in the Achilles bilaterally.  Sensation is intact to light touch.  Smooth motion of both hips.  Calves are supple and nontender.  Specialty Comments:  No specialty comments available.  Imaging: No results found.   PMFS History:  Current Outpatient Medications  Medication Sig Dispense Refill  . aspirin 81 MG tablet Take 81 mg by mouth daily.    . butalbital-acetaminophen-caffeine (FIORICET, ESGIC) 50-325-40 MG tablet Take 1 tablet by mouth every 6 (six) hours as needed. 30 tablet 1  . carvedilol (COREG) 6.25 MG tablet Take 1 tablet (6.25  mg total) by mouth 2 (two) times daily with a meal. 180 tablet 3  . ezetimibe (ZETIA) 10 MG tablet TAKE 1 TABLET(10 MG) BY MOUTH DAILY 90 tablet 3  . finasteride (PROSCAR) 5 MG tablet Take 1 tablet (5 mg total) by mouth daily. 90 tablet 3  . Omega-3 Fatty Acids (FISH OIL) 1200 MG CAPS Take 1,200 mg by mouth daily. Reported on 02/23/2015    . oxyCODONE (ROXICODONE) 5 MG immediate release tablet Take 1 tablet (5 mg total) by mouth every 6 (six) hours as needed for moderate pain. 40 tablet 0  . simvastatin (ZOCOR) 80 MG tablet TAKE 1 TABLET(80 MG) BY MOUTH DAILY 90 tablet 3  . tamsulosin (FLOMAX) 0.4 MG CAPS capsule Take 1 capsule (0.4 mg total) by mouth at bedtime. 90 capsule 3   No current facility-administered medications for this visit.     Patient Active Problem List   Diagnosis Date Noted  . Primary osteoarthritis of left knee 05/06/2019  . Sciatica, right side 05/06/2019  . Pain in left knee 01/21/2019  . Toe pain, right 05/13/2018  . Medicare annual wellness visit, subsequent 02/23/2015  . Advanced care planning/counseling discussion 02/23/2015  . Health maintenance examination 08/28/2013  . SVT (supraventricular tachycardia) ([facility]) 03/11/2013  . Dysplastic nevus of trunk 08/20/2012  . Aortic insufficiency   . Right ventricular dysfunction   . RBBB   . Status post mitral valve repair   . Chest discomfort   . Mitral regurgitation   . Migraines   . Exertional dyspnea 12/14/2008  . Dyslipidemia 12/13/2008  . Benign prostatic hyperplasia 05/10/2007   Past Medical History:  Diagnosis Date  . Allergy   . Aortic insufficiency    Mild, echo, January, 2014  . BCC (basal cell carcinoma), back 2017   s/p excisional biospy by derm  . BPH (benign prostatic hypertrophy)   . Chest discomfort    nuclear 06/24/09, suggestion prior small inferior MI with moderate peri-infarvt ischemia versus variable diaphragmatic attenuation  . Colon polyps    colonoscopy 07/02/06, repeat in 10 years    . Dyslipidemia   . Ejection fraction    EF 55%, echo, October, 2010  //   EF 45-50%,  catheterization, 2011  . Heart murmur   . Hyperlipidemia   . Migraines   . Mitral regurgitation    Mitral valve repair 2001  . Neurofibroma of foot 04/2017   s/p removal by derm  . Palpitations 07/2009    helped with carvedilol   . Pleurisy   . Postural dizziness    mild, no orthostatic changes by blood pressure check  . Pre-syncope    January, 2014  . RBBB  First seen January, 2014  . Right ventricular dysfunction    Mild/moderate right ventricular dysfunction by echo, January, 2014, no tricuspid regurgitation, therefore right heart pressure could not be estimated.  . Shortness of breath   . Status post mitral valve repair    2001,  //  echo, October, 2010, good function of the repaired mitral valve  . SVT (supraventricular tachycardia) ([place]) 03/11/2013   Isolated episodes - last 03/2013 after flu treated with adenosine cardioversion in ER.   Marland Kitchen Thyroid disease     Family History  Problem Relation Age of Onset  . Alzheimer's disease Mother   . Heart disease Mother        CHF  . Arthritis Father   . Heart disease Father        leaking mitral valve  . Benign prostatic hyperplasia Father   . Heart disease Brother        murmur  . Hypertension Neg Hx   . Diabetes Neg Hx   . Depression Neg Hx   . Alcohol abuse Neg Hx   . Drug abuse Neg Hx   . Stroke Neg Hx   . Cancer Neg Hx   . Colon cancer Neg Hx   . Esophageal cancer Neg Hx   . Rectal cancer Neg Hx   . Stomach cancer Neg Hx     Past Surgical History:  Procedure Laterality Date  . aneurysmal dilatation     proximal LAD cath 2001, reoeat catheter 2004, no significant change  . aneurysmal dilatiation  07/15/09   no change, normal right heart filling pressures and LVEDP- EF 45-50%  . APPENDECTOMY  1970  . CARDIAC CATHETERIZATION  2010   mild global LV dysfunction EF 45-50%, no significant CAD obstruction, aneurysmal dilatation LAD   . COLONOSCOPY  2008  . COLONOSCOPY  06/2016   diverticulosis, rpt 10 yrs Ardis Hughs)  . CORONARY ANGIOPLASTY  09/1999   severe MR o/w normal limits  . CORONARY ANGIOPLASTY  12/19/02   pos. CAD, mild LV dysfunction- med treatment  . EYE SURGERY Left   . HERNIA REPAIR  01/11/05   left, Dr. Hassell Done  . I & D EXTREMITY Right 02/04/2015   Procedure: IRRIGATION AND DEBRIDEMENT RIGHT INDEX FINGER OPEN FRACTURE AND REPAIR DIGITAL NERVE AND ARTERY;  Surgeon: Leanora Cover, MD;  Location: [facility];  Service: Orthopedics;  Laterality: Right;  . MITRAL VALVE REPAIR  01/22/00   good mitral valve function- echo 12/2008- EF 55% / nuclear 06/2009- EF 36%,? reliable? / EF 45-50%, cath rated 02/2010  . ORIF ELBOW FRACTURE     left  . POLYPECTOMY    . THYROID SURGERY    . THYROIDECTOMY     left, due to goiter 1994   Social History   Occupational History  . Occupation: Artist  Tobacco Use  . Smoking status: Never Smoker  . Smokeless tobacco: Never Used  Substance and Sexual Activity  . Alcohol use: No  . Drug use: No  . Sexual activity: Yes

## 2019-05-08 ENCOUNTER — Other Ambulatory Visit (INDEPENDENT_AMBULATORY_CARE_PROVIDER_SITE_OTHER): Payer: Medicare Other

## 2019-05-08 ENCOUNTER — Other Ambulatory Visit: Payer: Self-pay

## 2019-05-08 DIAGNOSIS — E785 Hyperlipidemia, unspecified: Secondary | ICD-10-CM

## 2019-05-08 DIAGNOSIS — N400 Enlarged prostate without lower urinary tract symptoms: Secondary | ICD-10-CM | POA: Diagnosis not present

## 2019-05-08 LAB — LIPID PANEL
Cholesterol: 126 mg/dL (ref 0–200)
HDL: 32.1 mg/dL — ABNORMAL LOW (ref >39.00)
LDL Cholesterol: 65 mg/dL (ref 0–99)
NonHDL: 93.44
Total CHOL/HDL Ratio: 4
Triglycerides: 143 mg/dL (ref 0.0–149.0)
VLDL: 28.6 mg/dL (ref 0.0–40.0)

## 2019-05-08 LAB — COMPREHENSIVE METABOLIC PANEL
ALT: 23 U/L (ref 0–53)
AST: 20 U/L (ref 0–37)
Albumin: 4.3 g/dL (ref 3.5–5.2)
Alkaline Phosphatase: 64 U/L (ref 39–117)
BUN: 17 mg/dL (ref 6–23)
CO2: 28 mEq/L (ref 19–32)
Calcium: 9.7 mg/dL (ref 8.4–10.5)
Chloride: 105 mEq/L (ref 96–112)
Creatinine, Ser: 1.2 mg/dL (ref 0.40–1.50)
GFR: 59.96 mL/min — ABNORMAL LOW (ref >60.00)
Glucose, Bld: 104 mg/dL — ABNORMAL HIGH (ref 70–99)
Potassium: 4.2 mEq/L (ref 3.5–5.1)
Sodium: 140 mEq/L (ref 135–145)
Total Bilirubin: 0.8 mg/dL (ref 0.2–1.2)
Total Protein: 6.8 g/dL (ref 6.0–8.3)

## 2019-05-08 LAB — TSH: TSH: 2.99 u[IU]/mL (ref 0.35–4.50)

## 2019-05-08 LAB — PSA: PSA: 0.59 ng/mL (ref 0.10–4.00)

## 2019-05-13 ENCOUNTER — Other Ambulatory Visit: Payer: Self-pay

## 2019-05-13 ENCOUNTER — Ambulatory Visit (INDEPENDENT_AMBULATORY_CARE_PROVIDER_SITE_OTHER): Payer: Medicare Other | Admitting: Family Medicine

## 2019-05-13 ENCOUNTER — Encounter: Payer: Self-pay | Admitting: Family Medicine

## 2019-05-13 ENCOUNTER — Ambulatory Visit: Payer: Medicare Other

## 2019-05-13 VITALS — BP 124/82 | HR 68 | Temp 97.6°F | Ht 70.0 in | Wt 228.0 lb

## 2019-05-13 DIAGNOSIS — Z0000 Encounter for general adult medical examination without abnormal findings: Secondary | ICD-10-CM

## 2019-05-13 DIAGNOSIS — Z9889 Other specified postprocedural states: Secondary | ICD-10-CM

## 2019-05-13 DIAGNOSIS — E785 Hyperlipidemia, unspecified: Secondary | ICD-10-CM

## 2019-05-13 DIAGNOSIS — N429 Disorder of prostate, unspecified: Secondary | ICD-10-CM | POA: Insufficient documentation

## 2019-05-13 DIAGNOSIS — N400 Enlarged prostate without lower urinary tract symptoms: Secondary | ICD-10-CM

## 2019-05-13 DIAGNOSIS — M1712 Unilateral primary osteoarthritis, left knee: Secondary | ICD-10-CM

## 2019-05-13 DIAGNOSIS — G43909 Migraine, unspecified, not intractable, without status migrainosus: Secondary | ICD-10-CM

## 2019-05-13 MED ORDER — FINASTERIDE 5 MG PO TABS: 5.0000 mg | ORAL_TABLET | Freq: Every day | ORAL | 3 refills | Status: DC

## 2019-05-13 MED ORDER — TAMSULOSIN HCL 0.4 MG PO CAPS: 0.4000 mg | ORAL_CAPSULE | Freq: Every day | ORAL | 3 refills | Status: DC

## 2019-05-13 MED ORDER — CARVEDILOL 6.25 MG PO TABS: 6.2500 mg | ORAL_TABLET | Freq: Two times a day (BID) | ORAL | 3 refills | Status: DC

## 2019-05-13 MED ORDER — EZETIMIBE 10 MG PO TABS: ORAL_TABLET | ORAL | 3 refills | Status: DC

## 2019-05-13 MED ORDER — SIMVASTATIN 80 MG PO TABS: ORAL_TABLET | ORAL | 3 refills | Status: DC

## 2019-05-13 NOTE — Assessment & Plan Note (Signed)
Chronic, stable on current regimen, low HDL. Activity limited by L pain.  The ASCVD Risk score Mikey Bussing DC Jr., et al., 2013) failed to calculate for the following reasons:   The valid total cholesterol range is 130 to 320 mg/dL

## 2019-05-13 NOTE — Assessment & Plan Note (Signed)
Preventative protocols reviewed and updated unless pt declined. Discussed healthy diet and lifestyle.  

## 2019-05-13 NOTE — Assessment & Plan Note (Signed)

## 2019-05-13 NOTE — Assessment & Plan Note (Signed)
Some induration noted to R lobe of prostate, new. Last DRE 02/2018. On finasteride and flomax without residual BPH symptoms. Will refer to urology for exam and opinion. Pt agrees with plan.

## 2019-05-13 NOTE — Patient Instructions (Addendum)
You are doing well today I'm glad you are getting the covid vaccines.  Medicines refilled today Return as needed or in 1 year for next wellness visit/physical. Slight hardening on right side of prostate - I would like to get urology opinion on this - so will refer you.   Health Maintenance After Age 70 After age 71, you are at a higher risk for certain long-term diseases and infections as well as injuries from falls. Falls are a major cause of broken bones and head injuries in people who are older than age 25. Getting regular preventive care can help to keep you healthy and well. Preventive care includes getting regular testing and making lifestyle changes as recommended by your health care provider. Talk with your health care provider about:  Which screenings and tests you should have. A screening is a test that checks for a disease when you have no symptoms.  A diet and exercise plan that is right for you. What should I know about screenings and tests to prevent falls? Screening and testing are the best ways to find a health problem early. Early diagnosis and treatment give you the best chance of managing medical conditions that are common after age 37. Certain conditions and lifestyle choices may make you more likely to have a fall. Your health care provider may recommend:  Regular vision checks. Poor vision and conditions such as cataracts can make you more likely to have a fall. If you wear glasses, make sure to get your prescription updated if your vision changes.  Medicine review. Work with your health care provider to regularly review all of the medicines you are taking, including over-the-counter medicines. Ask your health care provider about any side effects that may make you more likely to have a fall. Tell your health care provider if any medicines that you take make you feel dizzy or sleepy.  Osteoporosis screening. Osteoporosis is a condition that causes the bones to get weaker. This  can make the bones weak and cause them to break more easily.  Blood pressure screening. Blood pressure changes and medicines to control blood pressure can make you feel dizzy.  Strength and balance checks. Your health care provider may recommend certain tests to check your strength and balance while standing, walking, or changing positions.  Foot health exam. Foot pain and numbness, as well as not wearing proper footwear, can make you more likely to have a fall.  Depression screening. You may be more likely to have a fall if you have a fear of falling, feel emotionally low, or feel unable to do activities that you used to do.  Alcohol use screening. Using too much alcohol can affect your balance and may make you more likely to have a fall. What actions can I take to lower my risk of falls? General instructions  Talk with your health care provider about your risks for falling. Tell your health care provider if: ? You fall. Be sure to tell your health care provider about all falls, even ones that seem minor. ? You feel dizzy, sleepy, or off-balance.  Take over-the-counter and prescription medicines only as told by your health care provider. These include any supplements.  Eat a healthy diet and maintain a healthy weight. A healthy diet includes low-fat dairy products, low-fat (lean) meats, and fiber from whole grains, beans, and lots of fruits and vegetables. Home safety  Remove any tripping hazards, such as rugs, cords, and clutter.  Install safety equipment such as grab  bars in bathrooms and safety rails on stairs.  Keep rooms and walkways well-lit. Activity   Follow a regular exercise program to stay fit. This will help you maintain your balance. Ask your health care provider what types of exercise are appropriate for you.  If you need a cane or walker, use it as recommended by your health care provider.  Wear supportive shoes that have nonskid soles. Lifestyle  Do not drink  alcohol if your health care provider tells you not to drink.  If you drink alcohol, limit how much you have: ? 0-1 drink a day for women. ? 0-2 drinks a day for men.  Be aware of how much alcohol is in your drink. In the [place] one drink equals one typical bottle of beer (12 oz), one-half glass of wine (5 oz), or one shot of hard liquor (1 oz).  Do not use any products that contain nicotine or tobacco, such as cigarettes and e-cigarettes. If you need help quitting, ask your health care provider. Summary  Having a healthy lifestyle and getting preventive care can help to protect your health and wellness after age 64.  Screening and testing are the best way to find a health problem early and help you avoid having a fall. Early diagnosis and treatment give you the best chance for managing medical conditions that are more common for people who are older than age 91.  Falls are a major cause of broken bones and head injuries in people who are older than age 20. Take precautions to prevent a fall at home.  Work with your health care provider to learn what changes you can make to improve your health and wellness and to prevent falls. This information is not intended to replace advice given to you by your health care provider. Make sure you discuss any questions you have with your health care provider. Document Revised: 06/13/2018 Document Reviewed: 01/03/2017 Elsevier Patient Education  2020 [employer].

## 2019-05-13 NOTE — Assessment & Plan Note (Signed)
Significant improvement since retirement.

## 2019-05-13 NOTE — Progress Notes (Signed)
This visit was conducted in person.  BP 124/82 (BP Location: Left Arm, Patient Position: Sitting, Cuff Size: Normal)    Pulse 68    Temp 97.6 F (36.4 C) (Temporal)    Ht 5\' 10"  (1.778 m)    Wt 228 lb (103.4 kg)    SpO2 98%    BMI 32.71 kg/m    CC: AMW/CPE Subjective:    Patient ID: Alec Maldonado, male    DOB: [birth year], 69 y.o.   MRN: [id number]  HPI: Alec Maldonado is a 69 y.o. male presenting on 05/13/2019 for Medicare Wellness   Did not see health advisor this year.    Hearing Screening   125Hz  250Hz  500Hz  1000Hz  2000Hz  3000Hz  4000Hz  6000Hz  8000Hz   Right ear:   20 20 20  25     Left ear:   20 40 20  40    Vision Screening Comments: Last eye exam, spring 2020.    Office Visit from 05/13/2019 in [facility]  PHQ-2 Total Score  0      Fall Risk  05/13/2019 05/10/2018 03/13/2017 02/24/2016 02/23/2015  Falls in the past year? 0 0 No No No    Preventative: COLONOSCOPY 06/2016 diverticulosis, rpt 10 yrs Ardis Hughs) Prostate screen yearly. Denies BPH sxs.  Lung cancer screening - not eligible Flu yearly Tdap 2015 again 02/2015 Pneumovax 2017, prevnar 2016 Shingrix- declines, unaffordable Pfizer covid shot 1st shot, second one tomorrow  Advanced directive discussion - scanned in chart 03/2017. Wife Hoyle Sauer would be HCPOA, then son Marjory Lies and daughter Tilda Burrow. Would want HCPOA to make end of life decisions.  Seat belt use discussed Sunscreen use discussed.S/p BCC removed from back 2018 - Dr Ronnald Ramp.  Non smoker Alcohol - none Dentist q6 mo Eye exam yearly  Bowel - no constipation Bladder - no incontinence  Widowed, wife deceased colon cancer Aug 01, 1996, remarried 2001, lives with remarried wife  Occupation: retired, prior Insurance underwriter for coffee Radiographer, therapeutic [employer] Activity: walking 30-23min3d/wk, cuts wood Diet: good water, fruits/vegetablesdaily     Relevant past medical, surgical, family and social history reviewed and updated as indicated. Interim medical  history since our last visit reviewed. Allergies and medications reviewed and updated. Outpatient Medications Prior to Visit  Medication Sig Dispense Refill   aspirin 81 MG tablet Take 81 mg by mouth daily.     butalbital-acetaminophen-caffeine (FIORICET, ESGIC) 50-325-40 MG tablet Take 1 tablet by mouth every 6 (six) hours as needed. 30 tablet 1   diclofenac Sodium (VOLTAREN) 1 % GEL Apply topically as needed.     Omega-3 Fatty Acids (FISH OIL) 1200 MG CAPS Take 1,200 mg by mouth daily. Reported on 02/23/2015     carvedilol (COREG) 6.25 MG tablet Take 1 tablet (6.25 mg total) by mouth 2 (two) times daily with a meal. 180 tablet 3   ezetimibe (ZETIA) 10 MG tablet TAKE 1 TABLET(10 MG) BY MOUTH DAILY 90 tablet 3   finasteride (PROSCAR) 5 MG tablet Take 1 tablet (5 mg total) by mouth daily. 90 tablet 3   simvastatin (ZOCOR) 80 MG tablet TAKE 1 TABLET(80 MG) BY MOUTH DAILY 90 tablet 3   tamsulosin (FLOMAX) 0.4 MG CAPS capsule Take 1 capsule (0.4 mg total) by mouth at bedtime. 90 capsule 3   oxyCODONE (ROXICODONE) 5 MG immediate release tablet Take 1 tablet (5 mg total) by mouth every 6 (six) hours as needed for moderate pain. 40 tablet 0   No facility-administered medications prior to visit.  Per HPI unless specifically indicated in ROS section below Review of Systems  Constitutional: Negative for activity change, appetite change, chills, fatigue, fever and unexpected weight change.  HENT: Negative for hearing loss.   Eyes: Negative for visual disturbance.  Respiratory: Negative for cough, chest tightness, shortness of breath and wheezing.   Cardiovascular: Negative for chest pain, palpitations and leg swelling.  Gastrointestinal: Negative for abdominal distention, abdominal pain, blood in stool, constipation, diarrhea, nausea and vomiting.  Genitourinary: Negative for difficulty urinating and hematuria.  Musculoskeletal: Negative for arthralgias, myalgias and neck pain.  Skin:  Negative for rash.  Neurological: Negative for dizziness, seizures, syncope and headaches.  Hematological: Negative for adenopathy. Does not bruise/bleed easily.  Psychiatric/Behavioral: Negative for dysphoric mood. The patient is not nervous/anxious.    Objective:    BP 124/82 (BP Location: Left Arm, Patient Position: Sitting, Cuff Size: Normal)    Pulse 68    Temp 97.6 F (36.4 C) (Temporal)    Ht 5\' 10"  (1.778 m)    Wt 228 lb (103.4 kg)    SpO2 98%    BMI 32.71 kg/m   Wt Readings from Last 3 Encounters:  05/13/19 228 lb (103.4 kg)  05/06/19 220 lb (99.8 kg)  03/27/19 220 lb (99.8 kg)    Physical Exam Vitals and nursing note reviewed.  Constitutional:      General: He is not in acute distress.    Appearance: Normal appearance. He is well-developed. He is not ill-appearing.  HENT:     Head: Normocephalic and atraumatic.     Right Ear: Hearing, tympanic membrane, ear canal and external ear normal.     Left Ear: Hearing, tympanic membrane, ear canal and external ear normal.     Mouth/Throat:     Pharynx: Uvula midline.  Eyes:     General: No scleral icterus.    Extraocular Movements: Extraocular movements intact.     Conjunctiva/sclera: Conjunctivae normal.     Pupils: Pupils are equal, round, and reactive to light.  Cardiovascular:     Rate and Rhythm: Normal rate and regular rhythm.     Pulses: Normal pulses.          Radial pulses are 2+ on the right side and 2+ on the left side.     Heart sounds: Normal heart sounds. No murmur.  Pulmonary:     Effort: Pulmonary effort is normal. No respiratory distress.     Breath sounds: Normal breath sounds. No wheezing, rhonchi or rales.  Abdominal:     General: Abdomen is flat. Bowel sounds are normal. There is no distension.     Palpations: Abdomen is soft. There is no mass.     Tenderness: There is no abdominal tenderness. There is no guarding or rebound.     Hernia: No hernia is present.  Genitourinary:    Prostate: Not enlarged  (20-25gm), not tender and no nodules present.     Rectum: Normal. No mass, tenderness, anal fissure, external hemorrhoid or internal hemorrhoid. Normal anal tone.     Comments: Slight induration to R lobe Musculoskeletal:        General: Normal range of motion.     Cervical back: Normal range of motion and neck supple.     Right lower leg: No edema.     Left lower leg: No edema.  Lymphadenopathy:     Cervical: No cervical adenopathy.  Skin:    General: Skin is warm and dry.     Findings: No rash.  Comments: SKs on right posterior shoulder  Neurological:     General: No focal deficit present.     Mental Status: He is alert and oriented to person, place, and time.     Comments:  CN grossly intact, station and gait intact Recall 3/3  Calculation 5/5 DLROW  Psychiatric:        Mood and Affect: Mood normal.        Behavior: Behavior normal.        Thought Content: Thought content normal.        Judgment: Judgment normal.       Results for orders placed or performed in visit on 05/08/19  PSA  Result Value Ref Range   PSA 0.59 0.10 - 4.00 ng/mL  TSH  Result Value Ref Range   TSH 2.99 0.35 - 4.50 uIU/mL  Comprehensive metabolic panel  Result Value Ref Range   Sodium 140 135 - 145 mEq/L   Potassium 4.2 3.5 - 5.1 mEq/L   Chloride 105 96 - 112 mEq/L   CO2 28 19 - 32 mEq/L   Glucose, Bld 104 (H) 70 - 99 mg/dL   BUN 17 6 - 23 mg/dL   Creatinine, Ser 1.20 0.40 - 1.50 mg/dL   Total Bilirubin 0.8 0.2 - 1.2 mg/dL   Alkaline Phosphatase 64 39 - 117 U/L   AST 20 0 - 37 U/L   ALT 23 0 - 53 U/L   Total Protein 6.8 6.0 - 8.3 g/dL   Albumin 4.3 3.5 - 5.2 g/dL   GFR 59.96 (L) >60.00 mL/min   Calcium 9.7 8.4 - 10.5 mg/dL  Lipid panel  Result Value Ref Range   Cholesterol 126 0 - 200 mg/dL   Triglycerides 143.0 0.0 - 149.0 mg/dL   HDL 32.10 (L) >39.00 mg/dL   VLDL 28.6 0.0 - 40.0 mg/dL   LDL Cholesterol 65 0 - 99 mg/dL   Total CHOL/HDL Ratio 4    NonHDL 93.44    Assessment &  Plan:  This visit occurred during the SARS-CoV-2 public health emergency.  Safety protocols were in place, including screening questions prior to the visit, additional usage of staff PPE, and extensive cleaning of exam room while observing appropriate contact time as indicated for disinfecting solutions.   Problem List Items Addressed This Visit    Status post mitral valve repair   Primary osteoarthritis of left knee   Migraines    Significant improvement since retirement.       Relevant Medications   carvedilol (COREG) 6.25 MG tablet   ezetimibe (ZETIA) 10 MG tablet   simvastatin (ZOCOR) 80 MG tablet   Medicare annual wellness visit, subsequent - Primary    I have personally reviewed the Medicare Annual Wellness questionnaire and have noted 1. The patient's medical and social history 2. Their use of alcohol, tobacco or illicit drugs 3. Their current medications and supplements 4. The patient's functional ability including ADL's, fall risks, home safety risks and hearing or visual impairment. Cognitive function has been assessed and addressed as indicated.  5. Diet and physical activity 6. Evidence for depression or mood disorders The patients weight, height, BMI have been recorded in the chart. I have made referrals, counseling and provided education to the patient based on review of the above and I have provided the pt with a written personalized care plan for preventive services. Provider list updated.. See scanned questionairre as needed for further documentation. Reviewed preventative protocols and updated unless pt declined.  Health maintenance examination    Preventative protocols reviewed and updated unless pt declined. Discussed healthy diet and lifestyle.       Dyslipidemia    Chronic, stable on current regimen, low HDL. Activity limited by L pain.  The ASCVD Risk score Mikey Bussing DC Jr., et al., 2013) failed to calculate for the following reasons:   The valid total  cholesterol range is 130 to 320 mg/dL       Relevant Medications   ezetimibe (ZETIA) 10 MG tablet   simvastatin (ZOCOR) 80 MG tablet   Benign prostatic hyperplasia    Chronic, longterm on finasteride and flomax without residual BPH symptoms. Stable PSA. Check DRE today.       Relevant Medications   finasteride (PROSCAR) 5 MG tablet   tamsulosin (FLOMAX) 0.4 MG CAPS capsule   Abnormal prostate by palpation    Some induration noted to R lobe of prostate, new. Last DRE 02/2018. On finasteride and flomax without residual BPH symptoms. Will refer to urology for exam and opinion. Pt agrees with plan.           Meds ordered this encounter  Medications   carvedilol (COREG) 6.25 MG tablet    Sig: Take 1 tablet (6.25 mg total) by mouth 2 (two) times daily with a meal.    Dispense:  180 tablet    Refill:  3   ezetimibe (ZETIA) 10 MG tablet    Sig: TAKE 1 TABLET(10 MG) BY MOUTH DAILY    Dispense:  90 tablet    Refill:  3   finasteride (PROSCAR) 5 MG tablet    Sig: Take 1 tablet (5 mg total) by mouth daily.    Dispense:  90 tablet    Refill:  3   simvastatin (ZOCOR) 80 MG tablet    Sig: TAKE 1 TABLET(80 MG) BY MOUTH DAILY    Dispense:  90 tablet    Refill:  3   tamsulosin (FLOMAX) 0.4 MG CAPS capsule    Sig: Take 1 capsule (0.4 mg total) by mouth at bedtime.    Dispense:  90 capsule    Refill:  3   No orders of the defined types were placed in this encounter.   Patient instructions: You are doing well today I'm glad you are getting the covid vaccines.  Medicines refilled today Return as needed or in 1 year for next wellness visit/physical. Slight hardening on right side of prostate - I would like to get urology opinion on this - so will refer you.   Follow up plan: Return in about 1 year (around 05/12/2020) for annual exam, prior fasting for blood work.  Ria Bush, MD

## 2019-05-13 NOTE — Assessment & Plan Note (Signed)
Chronic, longterm on finasteride and flomax without residual BPH symptoms. Stable PSA. Check DRE today.

## 2019-05-14 ENCOUNTER — Ambulatory Visit: Payer: Medicare Other | Attending: Internal Medicine

## 2019-05-14 DIAGNOSIS — Z23 Encounter for immunization: Secondary | ICD-10-CM

## 2019-05-14 NOTE — Progress Notes (Signed)
   Covid-19 Vaccination Clinic  Name:  Alec Maldonado    MRN: [id number] DOB: 03/12/1950  05/14/2019  [name] was observed post Covid-19 immunization for 15 minutes without incident. He was provided with Vaccine Information Sheet and instruction to access the V-Safe system.   [name] was instructed to call 911 with any severe reactions post vaccine: Marland Kitchen Difficulty breathing  . Swelling of face and throat  . A fast heartbeat  . A bad rash all over body  . Dizziness and weakness   Immunizations Administered    Name Date Dose VIS Date Route   Pfizer COVID-19 Vaccine 05/14/2019  1:41 PM 0.3 mL 02/14/2019 Intramuscular   Manufacturer: [place]   Lot: [id number]   [place]: [id number]

## 2019-06-14 DIAGNOSIS — H33002 Unspecified retinal detachment with retinal break, left eye: Secondary | ICD-10-CM | POA: Diagnosis not present

## 2019-06-14 DIAGNOSIS — Z961 Presence of intraocular lens: Secondary | ICD-10-CM | POA: Diagnosis not present

## 2019-06-16 ENCOUNTER — Emergency Department (HOSPITAL_COMMUNITY): Payer: Medicare Other

## 2019-06-16 ENCOUNTER — Other Ambulatory Visit: Payer: Self-pay

## 2019-06-16 ENCOUNTER — Encounter (HOSPITAL_COMMUNITY): Payer: Self-pay | Admitting: Emergency Medicine

## 2019-06-16 ENCOUNTER — Inpatient Hospital Stay (HOSPITAL_COMMUNITY): Payer: Medicare Other

## 2019-06-16 ENCOUNTER — Encounter (INDEPENDENT_AMBULATORY_CARE_PROVIDER_SITE_OTHER): Payer: Self-pay | Admitting: Ophthalmology

## 2019-06-16 ENCOUNTER — Inpatient Hospital Stay (HOSPITAL_COMMUNITY)
Admission: EM | Admit: 2019-06-16 | Discharge: 2019-06-18 | DRG: 981 | Disposition: A | Discharge status: Home / Self Care | Payer: Medicare Other | Attending: Family Medicine | Admitting: Family Medicine

## 2019-06-16 ENCOUNTER — Ambulatory Visit (INDEPENDENT_AMBULATORY_CARE_PROVIDER_SITE_OTHER): Payer: Medicare Other | Admitting: Ophthalmology

## 2019-06-16 ENCOUNTER — Observation Stay (HOSPITAL_COMMUNITY): Payer: Medicare Other

## 2019-06-16 DIAGNOSIS — I6340 Cerebral infarction due to embolism of unspecified cerebral artery: Secondary | ICD-10-CM | POA: Diagnosis present

## 2019-06-16 DIAGNOSIS — I471 Supraventricular tachycardia: Secondary | ICD-10-CM | POA: Diagnosis present

## 2019-06-16 DIAGNOSIS — Z86010 Personal history of colonic polyps: Secondary | ICD-10-CM

## 2019-06-16 DIAGNOSIS — I2510 Atherosclerotic heart disease of native coronary artery without angina pectoris: Secondary | ICD-10-CM | POA: Diagnosis present

## 2019-06-16 DIAGNOSIS — I083 Combined rheumatic disorders of mitral, aortic and tricuspid valves: Secondary | ICD-10-CM | POA: Diagnosis not present

## 2019-06-16 DIAGNOSIS — E890 Postprocedural hypothyroidism: Secondary | ICD-10-CM | POA: Diagnosis present

## 2019-06-16 DIAGNOSIS — I639 Cerebral infarction, unspecified: Secondary | ICD-10-CM | POA: Diagnosis not present

## 2019-06-16 DIAGNOSIS — H43812 Vitreous degeneration, left eye: Secondary | ICD-10-CM | POA: Diagnosis not present

## 2019-06-16 DIAGNOSIS — Z20822 Contact with and (suspected) exposure to covid-19: Secondary | ICD-10-CM | POA: Diagnosis not present

## 2019-06-16 DIAGNOSIS — H3412 Central retinal artery occlusion, left eye: Secondary | ICD-10-CM | POA: Diagnosis not present

## 2019-06-16 DIAGNOSIS — Z8673 Personal history of transient ischemic attack (TIA), and cerebral infarction without residual deficits: Secondary | ICD-10-CM | POA: Diagnosis present

## 2019-06-16 DIAGNOSIS — H5462 Unqualified visual loss, left eye, normal vision right eye: Secondary | ICD-10-CM

## 2019-06-16 DIAGNOSIS — Z6832 Body mass index (BMI) 32.0-32.9, adult: Secondary | ICD-10-CM

## 2019-06-16 DIAGNOSIS — N400 Enlarged prostate without lower urinary tract symptoms: Secondary | ICD-10-CM | POA: Diagnosis present

## 2019-06-16 DIAGNOSIS — E785 Hyperlipidemia, unspecified: Secondary | ICD-10-CM | POA: Diagnosis present

## 2019-06-16 DIAGNOSIS — E6609 Other obesity due to excess calories: Secondary | ICD-10-CM

## 2019-06-16 DIAGNOSIS — Z8249 Family history of ischemic heart disease and other diseases of the circulatory system: Secondary | ICD-10-CM

## 2019-06-16 DIAGNOSIS — R600 Localized edema: Secondary | ICD-10-CM | POA: Diagnosis not present

## 2019-06-16 DIAGNOSIS — Z03818 Encounter for observation for suspected exposure to other biological agents ruled out: Secondary | ICD-10-CM | POA: Diagnosis not present

## 2019-06-16 DIAGNOSIS — G43909 Migraine, unspecified, not intractable, without status migrainosus: Secondary | ICD-10-CM | POA: Diagnosis not present

## 2019-06-16 DIAGNOSIS — Z9861 Coronary angioplasty status: Secondary | ICD-10-CM

## 2019-06-16 DIAGNOSIS — Z7982 Long term (current) use of aspirin: Secondary | ICD-10-CM | POA: Diagnosis not present

## 2019-06-16 DIAGNOSIS — H547 Unspecified visual loss: Secondary | ICD-10-CM | POA: Diagnosis not present

## 2019-06-16 DIAGNOSIS — I340 Nonrheumatic mitral (valve) insufficiency: Secondary | ICD-10-CM | POA: Diagnosis not present

## 2019-06-16 DIAGNOSIS — Z79899 Other long term (current) drug therapy: Secondary | ICD-10-CM | POA: Diagnosis not present

## 2019-06-16 DIAGNOSIS — I63449 Cerebral infarction due to embolism of unspecified cerebellar artery: Secondary | ICD-10-CM | POA: Diagnosis present

## 2019-06-16 DIAGNOSIS — I6389 Other cerebral infarction: Secondary | ICD-10-CM

## 2019-06-16 DIAGNOSIS — R0602 Shortness of breath: Secondary | ICD-10-CM | POA: Diagnosis not present

## 2019-06-16 DIAGNOSIS — Z9889 Other specified postprocedural states: Secondary | ICD-10-CM | POA: Diagnosis not present

## 2019-06-16 DIAGNOSIS — Z85828 Personal history of other malignant neoplasm of skin: Secondary | ICD-10-CM | POA: Diagnosis not present

## 2019-06-16 DIAGNOSIS — I10 Essential (primary) hypertension: Secondary | ICD-10-CM | POA: Diagnosis present

## 2019-06-16 DIAGNOSIS — R609 Edema, unspecified: Secondary | ICD-10-CM | POA: Diagnosis not present

## 2019-06-16 DIAGNOSIS — E669 Obesity, unspecified: Secondary | ICD-10-CM | POA: Diagnosis present

## 2019-06-16 DIAGNOSIS — I63233 Cerebral infarction due to unspecified occlusion or stenosis of bilateral carotid arteries: Secondary | ICD-10-CM | POA: Diagnosis not present

## 2019-06-16 HISTORY — DX: Personal history of transient ischemic attack (TIA), and cerebral infarction without residual deficits: Z86.73

## 2019-06-16 HISTORY — DX: Central retinal artery occlusion, left eye: H34.12

## 2019-06-16 LAB — DIFFERENTIAL
Abs Immature Granulocytes: 0.06 10*3/uL (ref 0.00–0.07)
Basophils Absolute: 0 10*3/uL (ref 0.0–0.1)
Basophils Relative: 1 %
Eosinophils Absolute: 0.1 10*3/uL (ref 0.0–0.5)
Eosinophils Relative: 1 %
Immature Granulocytes: 1 %
Lymphocytes Relative: 21 %
Lymphs Abs: 1.7 10*3/uL (ref 0.7–4.0)
Monocytes Absolute: 0.8 10*3/uL (ref 0.1–1.0)
Monocytes Relative: 9 %
Neutro Abs: 5.5 10*3/uL (ref 1.7–7.7)
Neutrophils Relative %: 67 %

## 2019-06-16 LAB — SARS CORONAVIRUS 2 (TAT 6-24 HRS): SARS Coronavirus 2: NEGATIVE

## 2019-06-16 LAB — COMPREHENSIVE METABOLIC PANEL
ALT: 21 U/L (ref 0–44)
AST: 20 U/L (ref 15–41)
Albumin: 4.1 g/dL (ref 3.5–5.0)
Alkaline Phosphatase: 59 U/L (ref 38–126)
Anion gap: 10 (ref 5–15)
BUN: 12 mg/dL (ref 8–23)
CO2: 23 mmol/L (ref 22–32)
Calcium: 9.2 mg/dL (ref 8.9–10.3)
Chloride: 108 mmol/L (ref 98–111)
Creatinine, Ser: 1.04 mg/dL (ref 0.61–1.24)
GFR calc Af Amer: >60 mL/min (ref >60)
GFR calc non Af Amer: >60 mL/min (ref >60)
Glucose, Bld: 97 mg/dL (ref 70–99)
Potassium: 4.1 mmol/L (ref 3.5–5.1)
Sodium: 141 mmol/L (ref 135–145)
Total Bilirubin: 0.6 mg/dL (ref 0.3–1.2)
Total Protein: 7 g/dL (ref 6.5–8.1)

## 2019-06-16 LAB — CBC
HCT: 42.5 % (ref 39.0–52.0)
Hemoglobin: 13.4 g/dL (ref 13.0–17.0)
MCH: 26.8 pg (ref 26.0–34.0)
MCHC: 31.5 g/dL (ref 30.0–36.0)
MCV: 85 fL (ref 80.0–100.0)
Platelets: 262 10*3/uL (ref 150–400)
RBC: 5 MIL/uL (ref 4.22–5.81)
RDW: 13.1 % (ref 11.5–15.5)
WBC: 8.1 10*3/uL (ref 4.0–10.5)
nRBC: 0 % (ref 0.0–0.2)

## 2019-06-16 LAB — ECHOCARDIOGRAM COMPLETE
Height: 70 in
Weight: 3600 oz

## 2019-06-16 LAB — PROTIME-INR
INR: 1 (ref 0.8–1.2)
Prothrombin Time: 12.9 seconds (ref 11.4–15.2)

## 2019-06-16 LAB — APTT: aPTT: 30 seconds (ref 24–36)

## 2019-06-16 LAB — CBG MONITORING, ED: Glucose-Capillary: 86 mg/dL (ref 70–99)

## 2019-06-16 LAB — HIV ANTIBODY (ROUTINE TESTING W REFLEX): HIV Screen 4th Generation wRfx: NONREACTIVE

## 2019-06-16 MED ORDER — FINASTERIDE 5 MG PO TABS
5.0000 mg | ORAL_TABLET | Freq: Every day | ORAL | Status: DC
  Administered 2019-06-16 – 2019-06-17 (×2): 5 mg via ORAL
  Filled 2019-06-16 (×4): qty 1

## 2019-06-16 MED ORDER — ENOXAPARIN SODIUM 40 MG/0.4ML ~~LOC~~ SOLN
40.0000 mg | SUBCUTANEOUS | Status: DC
  Administered 2019-06-16 – 2019-06-17 (×2): 40 mg via SUBCUTANEOUS
  Filled 2019-06-16 (×2): qty 0.4

## 2019-06-16 MED ORDER — ATORVASTATIN CALCIUM 80 MG PO TABS
80.0000 mg | ORAL_TABLET | Freq: Every day | ORAL | Status: DC
  Filled 2019-06-16 (×2): qty 1

## 2019-06-16 MED ORDER — SODIUM CHLORIDE 0.9% FLUSH
3.0000 mL | Freq: Once | INTRAVENOUS | Status: AC
  Administered 2019-06-16: 3 mL via INTRAVENOUS

## 2019-06-16 MED ORDER — IOHEXOL 350 MG/ML SOLN
75.0000 mL | Freq: Once | INTRAVENOUS | Status: AC | PRN
  Administered 2019-06-16: 17:00:00 75 mL via INTRAVENOUS

## 2019-06-16 MED ORDER — SODIUM CHLORIDE 0.9 % IV SOLN: Freq: Once | INTRAVENOUS | Status: AC

## 2019-06-16 MED ORDER — EZETIMIBE 10 MG PO TABS
10.0000 mg | ORAL_TABLET | Freq: Every day | ORAL | Status: DC
  Filled 2019-06-16 (×3): qty 1

## 2019-06-16 MED ORDER — ACETAMINOPHEN 160 MG/5ML PO SOLN: 650.0000 mg | ORAL | Status: DC | PRN

## 2019-06-16 MED ORDER — ASPIRIN 300 MG RE SUPP: 300.0000 mg | Freq: Every day | RECTAL | Status: DC

## 2019-06-16 MED ORDER — TAMSULOSIN HCL 0.4 MG PO CAPS
0.4000 mg | ORAL_CAPSULE | Freq: Every day | ORAL | Status: DC
  Administered 2019-06-16 – 2019-06-17 (×2): 0.4 mg via ORAL
  Filled 2019-06-16 (×2): qty 1

## 2019-06-16 MED ORDER — ASPIRIN 325 MG PO TABS
325.0000 mg | ORAL_TABLET | Freq: Every day | ORAL | Status: DC
  Administered 2019-06-16 – 2019-06-17 (×2): 325 mg via ORAL
  Filled 2019-06-16 (×2): qty 1

## 2019-06-16 MED ORDER — STROKE: EARLY STAGES OF RECOVERY BOOK
Freq: Once | Status: DC
  Filled 2019-06-16: qty 1

## 2019-06-16 MED ORDER — ACETAMINOPHEN 650 MG RE SUPP: 650.0000 mg | RECTAL | Status: DC | PRN

## 2019-06-16 MED ORDER — SENNOSIDES-DOCUSATE SODIUM 8.6-50 MG PO TABS: 1.0000 | ORAL_TABLET | Freq: Every evening | ORAL | Status: DC | PRN

## 2019-06-16 MED ORDER — ACETAMINOPHEN 325 MG PO TABS
650.0000 mg | ORAL_TABLET | ORAL | Status: DC | PRN
  Administered 2019-06-17: 650 mg via ORAL
  Filled 2019-06-16: qty 2

## 2019-06-16 MED ORDER — CARVEDILOL 12.5 MG PO TABS
6.2500 mg | ORAL_TABLET | Freq: Two times a day (BID) | ORAL | Status: DC
  Administered 2019-06-16 – 2019-06-17 (×2): 6.25 mg via ORAL
  Filled 2019-06-16 (×2): qty 1

## 2019-06-16 NOTE — ED Triage Notes (Addendum)
Pt loss vision in L eye on Friday.  Seen by Dr. Zadie Rhine this morning for central retinal artery occlusion of L eye.  Sent to ED for stroke work-up.  Denies numbness or weakness.

## 2019-06-16 NOTE — Progress Notes (Signed)
06/16/2019     CHIEF COMPLAINT Patient presents for Eye Problem   HISTORY OF PRESENT ILLNESS: Alec Maldonado is a 69 y.o. male who presents to the clinic today for:   HPI    Retinal detachment OS. ,, onset of vision loss OS, 3 DAYS PRIOR  Pt c/o curtain over temporal VA OS x 3 days. Pt denies flashes of light, floaters, or ocular pain. Pt denies symptoms OD.   NO OTHER NEURO SYMPTOMS   Last edited by Alec Horn, MD on 06/16/2019 10:29 AM. (History)      Referring physician: Vevelyn Royals, MD No address on file  HISTORICAL INFORMATION:   Selected notes from the [facility]: No current outpatient medications on file. (Ophthalmic Drugs)   No current facility-administered medications for this visit. (Ophthalmic Drugs)   Current Outpatient Medications (Other)  Medication Sig  . aspirin 81 MG tablet Take 81 mg by mouth daily.  . butalbital-acetaminophen-caffeine (FIORICET, ESGIC) 50-325-40 MG tablet Take 1 tablet by mouth every 6 (six) hours as needed.  . carvedilol (COREG) 6.25 MG tablet Take 1 tablet (6.25 mg total) by mouth 2 (two) times daily with a meal.  . diclofenac Sodium (VOLTAREN) 1 % GEL Apply topically as needed.  . ezetimibe (ZETIA) 10 MG tablet TAKE 1 TABLET(10 MG) BY MOUTH DAILY  . finasteride (PROSCAR) 5 MG tablet Take 1 tablet (5 mg total) by mouth daily.  . Omega-3 Fatty Acids (FISH OIL) 1200 MG CAPS Take 1,200 mg by mouth daily. Reported on 02/23/2015  . simvastatin (ZOCOR) 80 MG tablet TAKE 1 TABLET(80 MG) BY MOUTH DAILY  . tamsulosin (FLOMAX) 0.4 MG CAPS capsule Take 1 capsule (0.4 mg total) by mouth at bedtime.   No current facility-administered medications for this visit. (Other)      REVIEW OF SYSTEMS: ROS    Negative for: Constitutional, Gastrointestinal, Neurological, Skin, Genitourinary, Musculoskeletal, HENT, Endocrine, Cardiovascular, Eyes, Respiratory, Psychiatric, Allergic/Imm, Heme/Lymph   Last  edited by Alec Horn, MD on 06/16/2019 10:29 AM. (History)       ALLERGIES No Known Allergies  PAST MEDICAL HISTORY Past Medical History:  Diagnosis Date  . Allergy   . Aortic insufficiency    Mild, echo, January, 2014  . BCC (basal cell carcinoma), back 2017   s/p excisional biospy by derm  . BPH (benign prostatic hypertrophy)   . Chest discomfort    nuclear 06/24/09, suggestion prior small inferior MI with moderate peri-infarvt ischemia versus variable diaphragmatic attenuation  . Colon polyps    colonoscopy 07/02/06, repeat in 10 years  . Dyslipidemia   . Ejection fraction    EF 55%, echo, October, 2010  //   EF 45-50%,  catheterization, 2011  . Heart murmur   . Hyperlipidemia   . Migraines   . Mitral regurgitation    Mitral valve repair 2001  . Neurofibroma of foot 04/2017   s/p removal by derm  . Palpitations 07/2009    helped with carvedilol   . Pleurisy   . Postural dizziness    mild, no orthostatic changes by blood pressure check  . Pre-syncope    January, 2014  . RBBB    First seen January, 2014  . Right ventricular dysfunction    Mild/moderate right ventricular dysfunction by echo, January, 2014, no tricuspid regurgitation, therefore right heart pressure could not be estimated.  . Shortness of breath   . Status post mitral valve repair  2001,  //  echo, October, 2010, good function of the repaired mitral valve  . SVT (supraventricular tachycardia) ([place]) 03/11/2013   Isolated episodes - last 03/2013 after flu treated with adenosine cardioversion in ER.   Marland Kitchen Thyroid disease    Past Surgical History:  Procedure Laterality Date  . aneurysmal dilatation     proximal LAD cath 2001, reoeat catheter 2004, no significant change  . aneurysmal dilatiation  07/15/09   no change, normal right heart filling pressures and LVEDP- EF 45-50%  . APPENDECTOMY  1970  . CARDIAC CATHETERIZATION  2010   mild global LV dysfunction EF 45-50%, no significant CAD obstruction,  aneurysmal dilatation LAD  . COLONOSCOPY  2008  . COLONOSCOPY  06/2016   diverticulosis, rpt 10 yrs Alec Maldonado)  . CORONARY ANGIOPLASTY  09/1999   severe MR o/w normal limits  . CORONARY ANGIOPLASTY  12/19/02   pos. CAD, mild LV dysfunction- med treatment  . EYE SURGERY Left   . HERNIA REPAIR  01/11/05   left, Dr. Hassell Done  . I & D EXTREMITY Right 02/04/2015   Procedure: IRRIGATION AND DEBRIDEMENT RIGHT INDEX FINGER OPEN FRACTURE AND REPAIR DIGITAL NERVE AND ARTERY;  Surgeon: Leanora Cover, MD;  Location: [facility];  Service: Orthopedics;  Laterality: Right;  . MITRAL VALVE REPAIR  01/22/00   good mitral valve function- echo 12/2008- EF 55% / nuclear 06/2009- EF 36%,? reliable? / EF 45-50%, cath rated 02/2010  . ORIF ELBOW FRACTURE     left  . POLYPECTOMY    . THYROID SURGERY    . THYROIDECTOMY     left, due to goiter 1994    FAMILY HISTORY Family History  Problem Relation Age of Onset  . Alzheimer's disease Mother   . Heart disease Mother        CHF  . Arthritis Father   . Heart disease Father        leaking mitral valve  . Benign prostatic hyperplasia Father   . Heart disease Brother        murmur  . Hypertension Neg Hx   . Diabetes Neg Hx   . Depression Neg Hx   . Alcohol abuse Neg Hx   . Drug abuse Neg Hx   . Stroke Neg Hx   . Cancer Neg Hx   . Colon cancer Neg Hx   . Esophageal cancer Neg Hx   . Rectal cancer Neg Hx   . Stomach cancer Neg Hx     SOCIAL HISTORY Social History   Tobacco Use  . Smoking status: Never Smoker  . Smokeless tobacco: Never Used  Substance Use Topics  . Alcohol use: No  . Drug use: No         OPHTHALMIC EXAM:  Base Eye Exam    Visual Acuity (Snellen - Linear)      Right Left   Dist Alec Maldonado 20/20 -2 HM   Dist ph Alec Maldonado  NI       Tonometry (Tonopen, 8:42 AM)      Right Left   Pressure 11 10       Pupils      Pupils Dark Light Shape React APD   Right PERRL 4 3 Round Brisk None   Left PERRL 4 3 Round Brisk +1       Visual Fields  (Counting fingers)      Left Right     Full   Restrictions Total superior temporal, inferior temporal, superior nasal, inferior nasal deficiencies  Extraocular Movement      Right Left    Full Full       Neuro/Psych    Oriented x3: Yes   Mood/Affect: Normal       Dilation    Both eyes: 1.0% Mydriacyl, 2.5% Phenylephrine @ 8:42 AM        Slit Lamp and Fundus Exam    External Exam      Right Left   External Normal Normal       Slit Lamp Exam      Right Left   Lids/Lashes Normal Normal   Conjunctiva/Sclera White and quiet White and quiet   Cornea Clear Clear   Anterior Chamber Deep and quiet Deep and quiet   Iris Round and reactive Round and reactive, sluggish   Lens Posterior chamber intraocular lens Posterior chamber intraocular lens   Anterior Vitreous Normal Normal       Fundus Exam      Right Left   Posterior Vitreous Central vitreous floaters Central vitreous floaters   Disc Peripapillary atrophy Peripapillary atrophy   C/D Ratio 0.2-.3 0.4   Macula Normal cherry red spot, with retinal whitening in macula   Vessels Normal Arteriolar narrowing,, attenuated particularly in the macula.   Periphery Normal Normal          IMAGING AND PROCEDURES  Imaging and Procedures for @TODAY @  Color Fundus Photography Optos - OU - Both Eyes       Right Eye Disc findings include normal observations. Macula : normal observations. Periphery : normal observations.   Left Eye Disc findings include pallor. Macula : flat. Vessels : attenuated. Periphery : normal observations.   Notes OS, with cherry-red spot due to diffuse macular retinal whitening.  Attenuated central retinal artery.       Fluorescein Angiography Optos (Transit OS)       Right Eye   Early phase findings include normal observations. Mid/Late phase findings include normal observations. Choroidal neovascularization is not present.   Left Eye   Early phase findings include blockage,  delayed filling. Mid/Late phase findings include blockage, delayed filling.   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  Extensive retinal nonperfusion and high risk for neovascularization OS.       B-Scan Ultrasound - OS - Left Eye       Quality was good. Findings included posterior vitreous detachment, vitreous opacities.   Notes No retinal detachment evident                ASSESSMENT/PLAN:  @PROBAPNOTE @    ICD-10-CM   1. Central retinal artery occlusion of left eye  H34.12 Color Fundus Photography Optos - OU - Both Eyes    Fluorescein Angiography Optos (Transit OS)    B-Scan Ultrasound - OS - Left Eye    1.  2.  3.  Ophthalmic Meds Ordered this visit:  No orders of the defined types were placed in this encounter.      Return in about 2 weeks (around 06/30/2019) for OS, PRP, dilate,,,,,,,, COLOR FP  OU.  Patient Instructions  Patient will need complete stroke work-up via [facility] emergency room.  I we will send him to [facility] triage emergency room now.  Work-up should include CBC, Westergren erythrocyte sedimentation rate testing.  Further neurological testing will be dependent upon the neurological service.    Explained the diagnoses, plan, and follow up with the patient and they expressed understanding.  Patient expressed understanding of the importance  of proper follow up care.   Clent Demark Omair Dettmer M.D. Diseases & Surgery of the Retina and Vitreous Retina & Diabetic [place] @TODAY @     Abbreviations: M myopia (nearsighted); A astigmatism; H hyperopia (farsighted); P presbyopia; Mrx spectacle prescription;  CTL contact lenses; OD right eye; OS left eye; OU both eyes  XT exotropia; ET esotropia; PEK punctate epithelial keratitis; PEE punctate epithelial erosions; DES dry eye syndrome; MGD meibomian gland dysfunction; ATs artificial tears; PFAT's preservative free artificial tears; [place] nuclear sclerotic  cataract; PSC posterior subcapsular cataract; ERM epi-retinal membrane; PVD posterior vitreous detachment; RD retinal detachment; DM diabetes mellitus; DR diabetic retinopathy; NPDR non-proliferative diabetic retinopathy; PDR proliferative diabetic retinopathy; CSME clinically significant macular edema; DME diabetic macular edema; dbh dot blot hemorrhages; CWS cotton wool spot; POAG primary open angle glaucoma; C/D cup-to-disc ratio; HVF humphrey visual field; GVF goldmann visual field; OCT optical coherence tomography; IOP intraocular pressure; BRVO Branch retinal vein occlusion; CRVO central retinal vein occlusion; CRAO central retinal artery occlusion; BRAO branch retinal artery occlusion; RT retinal tear; SB scleral buckle; PPV pars plana vitrectomy; VH Vitreous hemorrhage; PRP panretinal laser photocoagulation; IVK intravitreal kenalog; VMT vitreomacular traction; MH Macular hole;  NVD neovascularization of the disc; NVE neovascularization elsewhere; AREDS age related eye disease study; ARMD age related macular degeneration; POAG primary open angle glaucoma; EBMD epithelial/anterior basement membrane dystrophy; ACIOL anterior chamber intraocular lens; IOL intraocular lens; PCIOL posterior chamber intraocular lens; Phaco/IOL phacoemulsification with intraocular lens placement; [place] photorefractive keratectomy; LASIK laser assisted in situ keratomileusis; HTN hypertension; DM diabetes mellitus; COPD chronic obstructive pulmonary disease

## 2019-06-16 NOTE — ED Notes (Signed)
Pt called out because he said that he was seeing "sparkles" in his right eye. Neurology was paged and aspirin was administered.

## 2019-06-16 NOTE — ED Notes (Signed)
Attempted to call report

## 2019-06-16 NOTE — Patient Instructions (Signed)
Patient will need complete stroke work-up via [facility] emergency room.  I we will send him to [facility] triage emergency room now.  Work-up should include CBC, Westergren erythrocyte sedimentation rate testing.  Further neurological testing will be dependent upon the neurological service.

## 2019-06-16 NOTE — Progress Notes (Signed)
  Echocardiogram 2D Echocardiogram has been performed.  Randa Lynn Jalon Blackwelder 06/16/2019, 3:58 PM

## 2019-06-16 NOTE — Consult Note (Signed)
Neurology Consultation Reason for Consult: Central retinal artery occlusion Referring Physician: Vanita Panda, R  CC: Vision loss in left eye  History is obtained from: Patient  HPI: Alec Maldonado is a 69 y.o. male who was in his normal state of health until Friday evening when he suddenly had vision loss in his left eye.  Due to this, he sought care at his eye doctor this morning who did fluorescein angiography and told him that he has central retinal artery occlusion on the left.  He was referred to the emergency department for further evaluation.  He denies ever being told that he has atrial fibrillation, but has been told that he has an irregular heartbeat in the past.  He occasionally does endorse palpitations.  LKW: Friday evening tpa given?: no, outside window   ROS: A 14 point ROS was performed and is negative except as noted in the HPI.   Past Medical History:  Diagnosis Date  . Allergy   . Aortic insufficiency    Mild, echo, January, 2014  . BCC (basal cell carcinoma), back 2017   s/p excisional biospy by derm  . BPH (benign prostatic hypertrophy)   . Chest discomfort    nuclear 06/24/09, suggestion prior small inferior MI with moderate peri-infarvt ischemia versus variable diaphragmatic attenuation  . Colon polyps    colonoscopy 07/02/06, repeat in 10 years  . Dyslipidemia   . Ejection fraction    EF 55%, echo, October, 2010  //   EF 45-50%,  catheterization, 2011  . Heart murmur   . Hyperlipidemia   . Migraines   . Mitral regurgitation    Mitral valve repair 2001  . Neurofibroma of foot 04/2017   s/p removal by derm  . Palpitations 07/2009    helped with carvedilol   . Pleurisy   . Postural dizziness    mild, no orthostatic changes by blood pressure check  . Pre-syncope    January, 2014  . RBBB    First seen January, 2014  . Right ventricular dysfunction    Mild/moderate right ventricular dysfunction by echo, January, 2014, no tricuspid regurgitation, therefore  right heart pressure could not be estimated.  . Shortness of breath   . Status post mitral valve repair    2001,  //  echo, October, 2010, good function of the repaired mitral valve  . SVT (supraventricular tachycardia) ([place]) 03/11/2013   Isolated episodes - last 03/2013 after flu treated with adenosine cardioversion in ER.   Marland Kitchen Thyroid disease      Family History  Problem Relation Age of Onset  . Alzheimer's disease Mother   . Heart disease Mother        CHF  . Arthritis Father   . Heart disease Father        leaking mitral valve  . Benign prostatic hyperplasia Father   . Heart disease Brother        murmur  . Hypertension Neg Hx   . Diabetes Neg Hx   . Depression Neg Hx   . Alcohol abuse Neg Hx   . Drug abuse Neg Hx   . Stroke Neg Hx   . Cancer Neg Hx   . Colon cancer Neg Hx   . Esophageal cancer Neg Hx   . Rectal cancer Neg Hx   . Stomach cancer Neg Hx      Social History:  reports that he has never smoked. He has never used smokeless tobacco. He reports that he does not drink alcohol  or use drugs.   Exam: Current vital signs: BP 125/80   Pulse 65   Temp 98.1 F (36.7 C) (Oral)   Resp (!) 22   Ht 5\' 10"  (1.778 m)   Wt 102.1 kg   SpO2 98%   BMI 32.28 kg/m  Vital signs in last 24 hours: Temp:  [98.1 F (36.7 C)] 98.1 F (36.7 C) (04/12 1141) Pulse Rate:  [65-77] 65 (04/12 1330) Resp:  [17-24] 22 (04/12 1330) BP: (125-137)/(73-86) 125/80 (04/12 1330) SpO2:  [98 %-99 %] 98 % (04/12 1330) Weight:  [102.1 kg] 102.1 kg (04/12 1140)   Physical Exam  Constitutional: Appears well-developed and well-nourished.  Psych: Affect appropriate to situation Eyes: No scleral injection HENT: No OP obstrucion MSK: no joint deformities.  Cardiovascular: Normal rate and regular rhythm.  Respiratory: Effort normal, non-labored breathing GI: Soft.  No distension. There is no tenderness.  Skin: WDI  Neuro: Mental Status: Patient is awake, alert, oriented to person,  place, month, year, and situation. Patient is able to give a clear and coherent history. No signs of aphasia or neglect Cranial Nerves: II: Visual Fields are full in the right eye, no light perception in the left eye.  Pupils were recently dilated by the ophthalmologist, but there is some reactivity bilaterally with APD on the left, right pupil is 8 mm, left pupil is 6 mm III,IV, VI: EOMI without ptosis or diploplia.  V: Facial sensation is symmetric to temperature VII: Facial movement is symmetric.  VIII: hearing is intact to voice X: Uvula elevates symmetrically XI: Shoulder shrug is symmetric. XII: tongue is midline without atrophy or fasciculations.  Motor: Tone is normal. Bulk is normal. 5/5 strength was present in all four extremities.  Sensory: Sensation is symmetric to light touch and temperature in the arms and legs. Deep Tendon Reflexes: 2+ and symmetric in the biceps and patellae.  Cerebellar: FNF slightly slow bilaterally, but without past-pointing  I have reviewed labs in epic and the results pertinent to this consultation are: CMP-unremarkable  I have reviewed the images obtained: Multifocal infarcts in multiple vascular distributions on diffusion imaging, consistent with central source  Impression: 69 year old male with multifocal ischemic infarcts consistent with central embolic source.  He will need further evaluation for source of emboli.  Recommendations: - HgbA1c, fasting lipid panel -CTA head neck - Frequent neuro checks - Echocardiogram - Prophylactic therapy-Antiplatelet med: Aspirin - dose 325mg  PO or 300mg  PR - Risk factor modification - Telemetry monitoring - PT consult, OT consult, Speech consult - Stroke team to follow    Roland Rack, MD [facility] [phone]  If 7pm- 7am, please page neurology on call as listed in [place].

## 2019-06-16 NOTE — H&P (Signed)
History and Physical    Alec Maldonado [id number] DOB: 1950-06-03 DOA: 06/16/2019  Referring MD/NP/PA: Carmin Muskrat, MD PCP: Ria Bush, MD  Patient coming from: From ophthalmologist office  Chief Complaint: Loss of vision in left eye  I have personally briefly reviewed patient's old medical records in [place]   HPI: Alec Maldonado is a 69 y.o. male with medical history significant of mitral valve repair, aortic insufficiency, hyperlipidemia, BPH presents with complaints of vision loss out of his left eye which started approximately 3 days ago.  Patient reports that he can only small amount of light on the far left side on the left eye, but nothing along the center.  He followed up with his eye doctor the next day, and was told to return today.  He underwent fluorescein angiography which revealed central retinal artery occlusion on the left, and was advised to come to the hospital for further evaluation.  He has a prior history of mitral valve repair over 20 years ago and still intermittently has palpitations.  Denies any history of being diagnosed with atrial fibrillation and is not on any oral anticoagulant.  Denies having any significant headache, focal weakness, change in speech, chest pain, nausea, vomiting, or diarrhea symptoms.  ED Course: Upon admission to the emergency department patient was noted to be afebrile and mildly tachypneic, but all other vital signs within normal limits.  Labs were relatively unremarkable.  Neurology was consulted.  Patient was out of the window for TPA.  MRI significant for several acute punctate emboli concerning for a central source. TRH called to admit.   Review of Systems  Constitutional: Negative for chills, fever and weight loss.  HENT: Negative for nosebleeds and sinus pain.   Eyes: Negative for discharge.       Positive for vision loss out of left eye  Respiratory: Negative for cough and shortness of breath.   Cardiovascular:  Positive for palpitations. Negative for chest pain.  Gastrointestinal: Negative for abdominal pain, diarrhea, nausea and vomiting.  Genitourinary: Negative for dysuria and hematuria.  Musculoskeletal: Negative for falls.  Skin: Negative for itching.  Neurological: Negative for sensory change and headaches.  Psychiatric/Behavioral: Negative for substance abuse.    Past Medical History:  Diagnosis Date  . Allergy   . Aortic insufficiency    Mild, echo, January, 2014  . BCC (basal cell carcinoma), back 2017   s/p excisional biospy by derm  . BPH (benign prostatic hypertrophy)   . Chest discomfort    nuclear 06/24/09, suggestion prior small inferior MI with moderate peri-infarvt ischemia versus variable diaphragmatic attenuation  . Colon polyps    colonoscopy 07/02/06, repeat in 10 years  . Dyslipidemia   . Ejection fraction    EF 55%, echo, October, 2010  //   EF 45-50%,  catheterization, 2011  . Heart murmur   . Hyperlipidemia   . Migraines   . Mitral regurgitation    Mitral valve repair 2001  . Neurofibroma of foot 04/2017   s/p removal by derm  . Palpitations 07/2009    helped with carvedilol   . Pleurisy   . Postural dizziness    mild, no orthostatic changes by blood pressure check  . Pre-syncope    January, 2014  . RBBB    First seen January, 2014  . Right ventricular dysfunction    Mild/moderate right ventricular dysfunction by echo, January, 2014, no tricuspid regurgitation, therefore right heart pressure could not be estimated.  . Shortness of  breath   . Status post mitral valve repair    2001,  //  echo, October, 2010, good function of the repaired mitral valve  . SVT (supraventricular tachycardia) ([place]) 03/11/2013   Isolated episodes - last 03/2013 after flu treated with adenosine cardioversion in ER.   Marland Kitchen Thyroid disease     Past Surgical History:  Procedure Laterality Date  . aneurysmal dilatation     proximal LAD cath 2001, reoeat catheter 2004, no significant  change  . aneurysmal dilatiation  07/15/09   no change, normal right heart filling pressures and LVEDP- EF 45-50%  . APPENDECTOMY  1970  . CARDIAC CATHETERIZATION  2010   mild global LV dysfunction EF 45-50%, no significant CAD obstruction, aneurysmal dilatation LAD  . COLONOSCOPY  2008  . COLONOSCOPY  06/2016   diverticulosis, rpt 10 yrs Ardis Hughs)  . CORONARY ANGIOPLASTY  09/1999   severe MR o/w normal limits  . CORONARY ANGIOPLASTY  12/19/02   pos. CAD, mild LV dysfunction- med treatment  . EYE SURGERY Left   . HERNIA REPAIR  01/11/05   left, Dr. Hassell Done  . I & D EXTREMITY Right 02/04/2015   Procedure: IRRIGATION AND DEBRIDEMENT RIGHT INDEX FINGER OPEN FRACTURE AND REPAIR DIGITAL NERVE AND ARTERY;  Surgeon: Leanora Cover, MD;  Location: [facility];  Service: Orthopedics;  Laterality: Right;  . MITRAL VALVE REPAIR  01/22/00   good mitral valve function- echo 12/2008- EF 55% / nuclear 06/2009- EF 36%,? reliable? / EF 45-50%, cath rated 02/2010  . ORIF ELBOW FRACTURE     left  . POLYPECTOMY    . THYROID SURGERY    . THYROIDECTOMY     left, due to goiter 1994     reports that he has never smoked. He has never used smokeless tobacco. He reports that he does not drink alcohol or use drugs.  No Known Allergies  Family History  Problem Relation Age of Onset  . Alzheimer's disease Mother   . Heart disease Mother        CHF  . Arthritis Father   . Heart disease Father        leaking mitral valve  . Benign prostatic hyperplasia Father   . Heart disease Brother        murmur  . Hypertension Neg Hx   . Diabetes Neg Hx   . Depression Neg Hx   . Alcohol abuse Neg Hx   . Drug abuse Neg Hx   . Stroke Neg Hx   . Cancer Neg Hx   . Colon cancer Neg Hx   . Esophageal cancer Neg Hx   . Rectal cancer Neg Hx   . Stomach cancer Neg Hx     Prior to Admission medications   Medication Sig Start Date End Date Taking? Authorizing Provider  aspirin 81 MG tablet Take 81 mg by mouth daily.    [provider]  butalbital-acetaminophen-caffeine (FIORICET, ESGIC) 50-325-40 MG tablet Take 1 tablet by mouth every 6 (six) hours as needed. 03/13/17   Ria Bush, MD  carvedilol (COREG) 6.25 MG tablet Take 1 tablet (6.25 mg total) by mouth 2 (two) times daily with a meal. 05/13/19   Ria Bush, MD  diclofenac Sodium (VOLTAREN) 1 % GEL Apply topically as needed.    [provider]  ezetimibe (ZETIA) 10 MG tablet TAKE 1 TABLET(10 MG) BY MOUTH DAILY 05/13/19   Ria Bush, MD  finasteride (PROSCAR) 5 MG tablet Take 1 tablet (5 mg total) by mouth  daily. 05/13/19   Ria Bush, MD  Omega-3 Fatty Acids (FISH OIL) 1200 MG CAPS Take 1,200 mg by mouth daily. Reported on 02/23/2015    [provider]  simvastatin (ZOCOR) 80 MG tablet TAKE 1 TABLET(80 MG) BY MOUTH DAILY 05/13/19   Ria Bush, MD  tamsulosin (FLOMAX) 0.4 MG CAPS capsule Take 1 capsule (0.4 mg total) by mouth at bedtime. 05/13/19   Ria Bush, MD    Physical Exam:  Constitutional: Elderly obese male NAD, calm, comfortable Vitals:   06/16/19 1141 06/16/19 1216 06/16/19 1230 06/16/19 1245  BP: 137/79 128/85 129/73 134/74  Pulse: 77 67 69 66  Resp: 17 20 (!) 22 19  Temp: 98.1 F (36.7 C)     TempSrc: Oral     SpO2: 98% 98% 98% 99%  Weight:      Height:       Eyes: Vision loss out of the left eye.  Pupil equal and reactive to light in the right eye ENMT: Mucous membranes are moist. Posterior pharynx clear of any exudate or lesions.Normal dentition.  Neck: normal, supple, no masses, no thyromegaly Respiratory: clear to auscultation bilaterally, no wheezing, no crackles. Normal respiratory effort. No accessory muscle use.  Cardiovascular: Regular rate and rhythm, no murmurs / rubs / gallops. No extremity edema. 2+ pedal pulses. No carotid bruits.  Abdomen: no tenderness, no masses palpated. No hepatosplenomegaly. Bowel sounds positive.  Musculoskeletal: no clubbing / cyanosis. No joint  deformity upper and lower extremities. Good ROM, no contractures. Normal muscle tone.  Skin: no rashes, lesions, ulcers. No induration Neurologic: CN 2-12 grossly intact. Sensation intact, DTR normal. Strength 5/5 in all 4.  Psychiatric: Normal judgment and insight. Alert and oriented x 3. Normal mood.     Labs on Admission: I have personally reviewed following labs and imaging studies  CBC: Recent Labs  Lab 06/16/19 1152  WBC 8.1  NEUTROABS 5.5  HGB 13.4  HCT 42.5  MCV 85.0  PLT 99991111   Basic Metabolic Panel: Recent Labs  Lab 06/16/19 1152  NA 141  K 4.1  CL 108  CO2 23  GLUCOSE 97  BUN 12  CREATININE 1.04  CALCIUM 9.2   GFR: Estimated Creatinine Clearance: 80.2 mL/min (by C-G formula based on SCr of 1.04 mg/dL). Liver Function Tests: Recent Labs  Lab 06/16/19 1152  AST 20  ALT 21  ALKPHOS 59  BILITOT 0.6  PROT 7.0  ALBUMIN 4.1   No results for input(s): LIPASE, AMYLASE in the last 168 hours. No results for input(s): AMMONIA in the last 168 hours. Coagulation Profile: Recent Labs  Lab 06/16/19 1152  INR 1.0   Cardiac Enzymes: No results for input(s): CKTOTAL, CKMB, CKMBINDEX, TROPONINI in the last 168 hours. BNP (last 3 results) No results for input(s): PROBNP in the last 8760 hours. HbA1C: No results for input(s): HGBA1C in the last 72 hours. CBG: Recent Labs  Lab 06/16/19 1222  GLUCAP 86   Lipid Profile: No results for input(s): CHOL, HDL, LDLCALC, TRIG, CHOLHDL, LDLDIRECT in the last 72 hours. Thyroid Function Tests: No results for input(s): TSH, T4TOTAL, FREET4, T3FREE, THYROIDAB in the last 72 hours. Anemia Panel: No results for input(s): VITAMINB12, FOLATE, FERRITIN, TIBC, IRON, RETICCTPCT in the last 72 hours. Urine analysis:    Component Value Date/Time   COLORURINE YELLOW 03/11/2013 1617   APPEARANCEUR CLOUDY (A) 03/11/2013 1617   LABSPEC 1.018 03/11/2013 1617   PHURINE 6.0 03/11/2013 1617   GLUCOSEU NEGATIVE 03/11/2013 1617    HGBUR  NEGATIVE 03/11/2013 1617   BILIRUBINUR negative 03/15/2018 [place] 03/11/2013 1617   PROTEINUR Negative 03/15/2018 0837   PROTEINUR NEGATIVE 03/11/2013 1617   UROBILINOGEN 0.2 03/15/2018 0837   UROBILINOGEN 0.2 03/11/2013 1617   NITRITE negative 03/15/2018 0837   NITRITE NEGATIVE 03/11/2013 1617   LEUKOCYTESUR Negative 03/15/2018 0837   Sepsis Labs: No results found for this or any previous visit (from the past 240 hour(s)).   Radiological Exams on Admission: B-Scan Ultrasound - OS - Left Eye  Result Date: 06/16/2019 Quality was good. Findings included posterior vitreous detachment, vitreous opacities. Notes No retinal detachment evident  Fluorescein Angiography Optos (Transit OS)  Result Date: 06/16/2019 Right Eye Early phase findings include normal observations. Mid/Late phase findings include normal observations. Choroidal neovascularization is not present. Left Eye Early phase findings include blockage, delayed filling. Mid/Late phase findings include blockage, delayed filling. 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 Extensive retinal nonperfusion and high risk for neovascularization OS.  Color Fundus Photography Optos - OU - Both Eyes  Result Date: 06/16/2019 Right Eye Disc findings include normal observations. Macula : normal observations. Periphery : normal observations. Left Eye Disc findings include pallor. Macula : flat. Vessels : attenuated. Periphery : normal observations. Notes OS, with cherry-red spot due to diffuse macular retinal whitening. Attenuated central retinal artery.   EKG: Independently reviewed.  Sinus rhythm at 72 bpm with right bundle branch block unchanged from previous  Assessment/Plan Embolic CVA  Central retinal artery occulsion, left: Acute.  Patient presents with vision loss of the left eye from his ophthalmologist office.  MRI showing punctate acute infarcts.  Neurology on board.   Patient out of the window for TPA. -Admit to a medical telemetry bed -TIA/stroke order set utilized -Check CT angiogram of the head neck -Check echocardiogram -Check hemoglobin A1c and lipid panel in a.m. -Aspirin -Follow-up telemetry overnight -Appreciate neurology consultative services,  will follow-up for further recommendation  Essential hypertension: Blood pressures currently maintained.  Home blood pressure medications include Coreg 6.25 mg twice daily -Continue Coreg  Dyslipidemia: Home medications include Zetia 10 mg daily and simvastatin 80 mg -Continue Zetia -Pharmacy substitution of atorvastatin  History of mitral valve repair: Patient reports occurred over 20 years ago  BPH: Home medication regimen includes Proscar 5 mg daily and Flomax 0.4 mg daily. -Continue Proscar and Flomax  Obesity: BMI 32.28 kg/m  DVT prophylaxis: Lovenox Code Status: Full Family Communication: Wife updated at bedside Disposition Plan: Possible discharge home in 1 to 2 days Consults called: Neurology Admission status: Inpatient  Norval Morton MD [facility] Pager [phone]   If 7PM-7AM, please contact night-coverage [email or web address] Password [facility]  06/16/2019, 1:57 PM

## 2019-06-16 NOTE — ED Provider Notes (Signed)
[facility] Provider Note   CSN: [id number] Arrival date & time: 06/16/19  1059     History Chief Complaint  Patient presents with  . loss of vision    Alec Maldonado is a 69 y.o. male.  HPI   Patient presents with vision loss, left eye.  Onset was 3 days ago, relatively sudden after a brief period of black dots obscuring his vision, he suddenly developed complete inability to see from his left eye.  There is no pain, no other accompanying weakness, no confusion, no vision changes in his right eye, nor any speech deficits.  With this concern he went to his ophthalmologist, today and was sent here for evaluation. He states that he is generally well though he does have a history of hypertension, and prior mitral valve repair. No recent medication change, diet change, activity change.  He notes that in the interval since onset of vision loss he has had no other new weakness, confusion, disorientation, speech difficulty either.   Past Medical History:  Diagnosis Date  . Allergy   . Aortic insufficiency    Mild, echo, January, 2014  . BCC (basal cell carcinoma), back 2017   s/p excisional biospy by derm  . BPH (benign prostatic hypertrophy)   . Chest discomfort    nuclear 06/24/09, suggestion prior small inferior MI with moderate peri-infarvt ischemia versus variable diaphragmatic attenuation  . Colon polyps    colonoscopy 07/02/06, repeat in 10 years  . Dyslipidemia   . Ejection fraction    EF 55%, echo, October, 2010  //   EF 45-50%,  catheterization, 2011  . Heart murmur   . Hyperlipidemia   . Migraines   . Mitral regurgitation    Mitral valve repair 2001  . Neurofibroma of foot 04/2017   s/p removal by derm  . Palpitations 07/2009    helped with carvedilol   . Pleurisy   . Postural dizziness    mild, no orthostatic changes by blood pressure check  . Pre-syncope    January, 2014  . RBBB    First seen January, 2014  . Right  ventricular dysfunction    Mild/moderate right ventricular dysfunction by echo, January, 2014, no tricuspid regurgitation, therefore right heart pressure could not be estimated.  . Shortness of breath   . Status post mitral valve repair    2001,  //  echo, October, 2010, good function of the repaired mitral valve  . SVT (supraventricular tachycardia) ([place]) 03/11/2013   Isolated episodes - last 03/2013 after flu treated with adenosine cardioversion in ER.   Marland Kitchen Thyroid disease     Patient Active Problem List   Diagnosis Date Noted  . Central retinal artery occlusion of left eye 06/16/2019  . Abnormal prostate by palpation 05/13/2019  . Primary osteoarthritis of left knee 05/06/2019  . Sciatica, right side 05/06/2019  . Pain in left knee 01/21/2019  . Medicare annual wellness visit, subsequent 02/23/2015  . Advanced care planning/counseling discussion 02/23/2015  . Health maintenance examination 08/28/2013  . SVT (supraventricular tachycardia) ([place]) 03/11/2013  . Dysplastic nevus of trunk 08/20/2012  . Aortic insufficiency   . Right ventricular dysfunction   . RBBB   . Status post mitral valve repair   . Chest discomfort   . Mitral regurgitation   . Migraines   . Exertional dyspnea 12/14/2008  . Dyslipidemia 12/13/2008  . Benign prostatic hyperplasia 05/10/2007    Past Surgical History:  Procedure Laterality Date  .  aneurysmal dilatation     proximal LAD cath 2001, reoeat catheter 2004, no significant change  . aneurysmal dilatiation  07/15/09   no change, normal right heart filling pressures and LVEDP- EF 45-50%  . APPENDECTOMY  1970  . CARDIAC CATHETERIZATION  2010   mild global LV dysfunction EF 45-50%, no significant CAD obstruction, aneurysmal dilatation LAD  . COLONOSCOPY  2008  . COLONOSCOPY  06/2016   diverticulosis, rpt 10 yrs [facility])  . CORONARY ANGIOPLASTY  09/1999   severe MR o/w normal limits  . CORONARY ANGIOPLASTY  12/19/02   pos. CAD, mild LV dysfunction- med  treatment  . EYE SURGERY Left   . HERNIA REPAIR  01/11/05   left, Dr. Hassell Maldonado  . I & D EXTREMITY Right 02/04/2015   Procedure: IRRIGATION AND DEBRIDEMENT RIGHT INDEX FINGER OPEN FRACTURE AND REPAIR DIGITAL NERVE AND ARTERY;  Surgeon: Alec Cover, MD;  Location: [place] [facility];  Service: Orthopedics;  Laterality: Right;  . MITRAL VALVE REPAIR  01/22/00   good mitral valve function- echo 12/2008- EF 55% / nuclear 06/2009- EF 36%,? reliable? / EF 45-50%, cath rated 02/2010  . ORIF ELBOW FRACTURE     left  . POLYPECTOMY    . THYROID SURGERY    . THYROIDECTOMY     left, due to goiter 1994       Family History  Problem Relation Age of Onset  . Alzheimer's disease Mother   . Heart disease Mother        CHF  . Arthritis Father   . Heart disease Father        leaking mitral valve  . Benign prostatic hyperplasia Father   . Heart disease Brother        murmur  . Hypertension Neg Hx   . Diabetes Neg Hx   . Depression Neg Hx   . Alcohol abuse Neg Hx   . Drug abuse Neg Hx   . Stroke Neg Hx   . Cancer Neg Hx   . Colon cancer Neg Hx   . Esophageal cancer Neg Hx   . Rectal cancer Neg Hx   . Stomach cancer Neg Hx     Social History   Tobacco Use  . Smoking status: Never Smoker  . Smokeless tobacco: Never Used  Substance Use Topics  . Alcohol use: No  . Drug use: No    Home Medications Prior to Admission medications   Medication Sig Start Date End Date Taking? Authorizing Provider  aspirin 81 MG tablet Take 81 mg by mouth daily.    [provider]  butalbital-acetaminophen-caffeine (FIORICET, ESGIC) 50-325-40 MG tablet Take 1 tablet by mouth every 6 (six) hours as needed. 03/13/17   Alec Bush, MD  carvedilol (COREG) 6.25 MG tablet Take 1 tablet (6.25 mg total) by mouth 2 (two) times daily with a meal. 05/13/19   Alec Bush, MD  diclofenac Sodium (VOLTAREN) 1 % GEL Apply topically as needed.    [provider]  ezetimibe (ZETIA) 10 MG tablet TAKE 1 TABLET(10 MG)  BY MOUTH DAILY 05/13/19   Alec Bush, MD  finasteride (PROSCAR) 5 MG tablet Take 1 tablet (5 mg total) by mouth daily. 05/13/19   Alec Bush, MD  Omega-3 Fatty Acids (FISH OIL) 1200 MG CAPS Take 1,200 mg by mouth daily. Reported on 02/23/2015    [provider]  simvastatin (ZOCOR) 80 MG tablet TAKE 1 TABLET(80 MG) BY MOUTH DAILY 05/13/19   Alec Bush, MD  tamsulosin (FLOMAX) 0.4 MG  CAPS capsule Take 1 capsule (0.4 mg total) by mouth at bedtime. 05/13/19   Alec Bush, MD    Allergies    Patient has no known allergies.  Review of Systems   Review of Systems  Constitutional:       Per HPI, otherwise negative  HENT:       Per HPI, otherwise negative  Eyes: Positive for visual disturbance. Negative for photophobia, pain, discharge, redness and itching.  Respiratory:       Per HPI, otherwise negative  Cardiovascular:       Per HPI, otherwise negative  Gastrointestinal: Negative for vomiting.  Endocrine:       Negative aside from HPI  Genitourinary:       Neg aside from HPI   Musculoskeletal:       Per HPI, otherwise negative  Skin: Negative.   Neurological: Negative for syncope.    Physical Exam Updated Vital Signs BP 134/74   Pulse 66   Temp 98.1 F (36.7 C) (Oral)   Resp 19   Ht 5\' 10"  (1.778 m)   Wt 102.1 kg   SpO2 99%   BMI 32.28 kg/m   Physical Exam Vitals and nursing note reviewed.  Constitutional:      General: He is not in acute distress.    Appearance: He is well-developed.  HENT:     Head: Normocephalic and atraumatic.  Eyes:     Conjunctiva/sclera: Conjunctivae normal.     Comments: Left pupil large, patient has no visual capacity in that area.  Right eye patient unaffected.  Cardiovascular:     Rate and Rhythm: Normal rate and regular rhythm.  Pulmonary:     Effort: Pulmonary effort is normal. No respiratory distress.     Breath sounds: No stridor.  Abdominal:     General: There is no distension.  Skin:    General:  Skin is warm and dry.  Neurological:     Mental Status: He is alert and oriented to person, place, and time.     Comments: Beyond left eye vision loss, no other notable abnormalities, face is symmetric, speech is clear no patient is oriented appropriately, moves all extremities spontaneously and to command, without deficiencies in coordination either.     ED Results / Procedures / Treatments   Labs (all labs ordered are listed, but only abnormal results are displayed) Labs Reviewed  SARS CORONAVIRUS 2 (TAT 6-24 HRS)  PROTIME-INR  APTT  CBC  DIFFERENTIAL  COMPREHENSIVE METABOLIC PANEL  CBG MONITORING, ED    EKG EKG Interpretation  Date/Time:  Monday June 16 2019 12:03:18 EDT Ventricular Rate:  72 PR Interval:    QRS Duration: 150 QT Interval:  434 QTC Calculation: 475 R Axis:   30 Text Interpretation: Sinus rhythm Right bundle branch block No significant change since last tracing Abnormal ECG Confirmed by Carmin Muskrat [phone]) on 06/16/2019 12:20:53 PM   Radiology B-Scan Ultrasound - OS - Left Eye  Result Date: 06/16/2019 Quality was good. Findings included posterior vitreous detachment, vitreous opacities. Notes No retinal detachment evident  Fluorescein Angiography Optos (Transit OS)  Result Date: 06/16/2019 Right Eye Early phase findings include normal observations. Mid/Late phase findings include normal observations. Choroidal neovascularization is not present. Left Eye Early phase findings include blockage, delayed filling. Mid/Late phase findings include blockage, delayed filling. 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 Extensive retinal nonperfusion and high risk for neovascularization OS.  Color Fundus Photography Optos - OU -  Both Eyes  Result Date: 06/16/2019 Right Eye Disc findings include normal observations. Macula : normal observations. Periphery : normal observations. Left Eye Disc findings include pallor. Macula  : flat. Vessels : attenuated. Periphery : normal observations. Notes OS, with cherry-red spot due to diffuse macular retinal whitening. Attenuated central retinal artery.   Procedures Procedures (including critical care time)  Medications Ordered in ED Medications  sodium chloride flush (NS) 0.9 % injection 3 mL (3 mLs Intravenous Given 06/16/19 1216)    ED Course  I have reviewed the triage vital signs and the nursing notes.  Pertinent labs & imaging results that were available during my care of the patient were reviewed by me and considered in my medical decision making (see chart for details).   Initial labs reassuring MDM Rules/Calculators/A&P This elderly male previously well presents with new vision loss, left-sided.  Onset a few days ago, with persistency is concerning for CRA O or other acute intracranial pathology. Patient has been seen and evaluated by his ophthalmologist already earlier in the day, and sent here for evaluation.  Here the patient is awake, alert, with no other focal neurologic deficiencies, no hemodynamic instability, and reassuring initial labs.  However, with concern for vision loss as above, need for stroke evaluation, monitoring, management I discussed this case with our neurology colleagues, who have seen and evaluated the patient.  Patient required admission for further monitoring, management, evaluation of the stroke profile. Final Clinical Impression(s) / ED Diagnoses Final diagnoses:  Vision loss of left eye      Carmin Muskrat, MD 06/16/19 1347

## 2019-06-16 NOTE — ED Notes (Signed)
Pt transported to MRI 

## 2019-06-17 ENCOUNTER — Inpatient Hospital Stay (HOSPITAL_COMMUNITY): Payer: Medicare Other

## 2019-06-17 DIAGNOSIS — E785 Hyperlipidemia, unspecified: Secondary | ICD-10-CM

## 2019-06-17 DIAGNOSIS — H3412 Central retinal artery occlusion, left eye: Principal | ICD-10-CM

## 2019-06-17 DIAGNOSIS — R609 Edema, unspecified: Secondary | ICD-10-CM

## 2019-06-17 LAB — LIPID PANEL
Cholesterol: 125 mg/dL (ref 0–200)
HDL: 26 mg/dL — ABNORMAL LOW (ref >40)
LDL Cholesterol: 60 mg/dL (ref 0–99)
Total CHOL/HDL Ratio: 4.8 RATIO
Triglycerides: 194 mg/dL — ABNORMAL HIGH (ref <150)
VLDL: 39 mg/dL (ref 0–40)

## 2019-06-17 MED ORDER — ASPIRIN EC 81 MG PO TBEC
81.0000 mg | DELAYED_RELEASE_TABLET | Freq: Every day | ORAL | Status: DC
  Administered 2019-06-18: 81 mg via ORAL
  Filled 2019-06-17: qty 1

## 2019-06-17 MED ORDER — SODIUM CHLORIDE 0.9 % IV SOLN: INTRAVENOUS | Status: DC

## 2019-06-17 MED ORDER — CLOPIDOGREL BISULFATE 75 MG PO TABS
75.0000 mg | ORAL_TABLET | Freq: Every day | ORAL | Status: DC
  Administered 2019-06-17 – 2019-06-18 (×2): 75 mg via ORAL
  Filled 2019-06-17 (×2): qty 1

## 2019-06-17 NOTE — Progress Notes (Signed)
Lower extremity venous has been completed.   Preliminary results in CV Proc.   Abram Sander 06/17/2019 8:50 AM

## 2019-06-17 NOTE — Evaluation (Signed)
Physical Therapy One-time Evaluation Patient Details Name: Alec Maldonado MRN: [id number] DOB: December 12, 1950 Today's Date: 06/17/2019   History of Present Illness  69 yo male admitted to ED on 4/12 with L eye central retinal artery occlusion and vision loss. MRI reveals several punctate emboli, specifically at R posterior corona radiata. PMH includes HTN, MVR, BCC, angina, HLD, RBBB.  Clinical Impression   Pt presents with near total L eye vision loss (can see light in periphery), [facility] strength, ROM, balance, gait, and activity tolerance. Pt ambulated great hallway distance, accepted good challenge to dynamic standing balance, and required no physiacl assist from PT. PT educated both pt and wife on s/s of CVA using BE FAST acronym. Pt also educated on visual scanning during mobility to ensure pt's L side environment is monitored. Pt demonstrates proficiency at this during hallway navigation. Lastly, PT encouraged pt to talk with MD about return to driving, as pt is eager to return to this ASAP but understands he should not be driving right now. All PT education completed, no further acute PT needs at this time, and pt with no further questions.    Follow Up Recommendations No PT follow up    Equipment Recommendations  None recommended by PT    Recommendations for Other Services       Precautions / Restrictions Precautions Precautions: Fall Restrictions Weight Bearing Restrictions: No      Mobility  Bed Mobility Overal bed mobility: Independent                Transfers Overall transfer level: Independent Equipment used: None             General transfer comment: independent, no evidence of imbalance  Ambulation/Gait Ambulation/Gait assistance: Supervision;Modified independent (Device/Increase time) Gait Distance (Feet): 300 Feet Assistive device: None Gait Pattern/deviations: Step-through pattern;WFL(Within Functional Limits) Gait velocity: WFL   General Gait Details:  good gait speed, PT cuing for special attention to L side and scanning due to L eye vision loss. Pt with 0 episodes of bumping into hallway objects on L. Accepts challenges to gait well. Walked to bathroom at end of session with mod I for slightly increased time  Stairs Stairs: (pt politely declined stair training, able to march in place repeatedly)          Wheelchair Mobility    Modified Rankin (Stroke Patients Only) Modified Rankin (Stroke Patients Only) Pre-Morbid Rankin Score: No symptoms Modified Rankin: No significant disability     Balance Overall balance assessment: Independent   Sitting balance-Leahy Scale: Normal       Standing balance-Leahy Scale: Normal               High level balance activites: Backward walking;Direction changes;Head turns;Other (comment) High Level Balance Comments: horizontal/vertical head turning, backward walking, marching steps, and direction changes all WFL             Pertinent Vitals/Pain Pain Assessment: No/denies pain    Home Living Family/patient expects to be discharged to:: Private residence Living Arrangements: Spouse/significant other Available Help at Discharge: Available 24 hours/day;Family Type of Home: House Home Access: Stairs to enter   [employer] of Steps: 5 Home Layout: Laundry or work area in basement;Two level;Able to live on main level with bedroom/bathroom Home Equipment: Shower seat      Prior Function Level of Independence: Independent         Comments: pt is retired, enjoys working on his yard, playing with his grandson who pt and pt's wife  take care of during the day 5 days a week     Hand Dominance   Dominant Hand: Right    Extremity/Trunk Assessment   Upper Extremity Assessment Upper Extremity Assessment: Overall WFL for tasks assessed    Lower Extremity Assessment Lower Extremity Assessment: Overall WFL for tasks assessed    Cervical / Trunk Assessment Cervical  / Trunk Assessment: Normal  Communication   Communication: No difficulties  Cognition Arousal/Alertness: Awake/alert Behavior During Therapy: WFL for tasks assessed/performed Overall Cognitive Status: Within Functional Limits for tasks assessed                                 General Comments: pleasant, excited to get out of bed      General Comments      Exercises Other Exercises Other Exercises: Education on BE FAST s/s of CVA, pt and wife understand and are receptive   Assessment/Plan    PT Assessment Patent does not need any further PT services  PT Problem List         PT Treatment Interventions      PT Goals (Current goals can be found in the Care Plan section)  Acute Rehab PT Goals Patient Stated Goal: go home ASAP PT Goal Formulation: With patient Time For Goal Achievement: 06/17/19 Potential to Achieve Goals: Good    Frequency     Barriers to discharge        Co-evaluation               AM-PAC PT "6 Clicks" Mobility  Outcome Measure Help needed turning from your back to your side while in a flat bed without using bedrails?: None Help needed moving from lying on your back to sitting on the side of a flat bed without using bedrails?: None Help needed moving to and from a bed to a chair (including a wheelchair)?: None Help needed standing up from a chair using your arms (e.g., wheelchair or bedside chair)?: None Help needed to walk in hospital room?: None Help needed climbing 3-5 steps with a railing? : None 6 Click Score: 24    End of Session   Activity Tolerance: Patient tolerated treatment well Patient left: with family/visitor present(up to bathroom) Nurse Communication: Mobility status PT Visit Diagnosis: Other abnormalities of gait and mobility (R26.89);Other symptoms and signs involving the nervous system (R29.898)    Time: 0940-1000 PT Time Calculation (min) (ACUTE ONLY): 20 min   Charges:   PT Evaluation $PT Eval Low  Complexity: 1 Low        Mellina Benison E, PT Acute Rehabilitation Services Pager [phone]  Office [phone]   Jamayia Croker D Elonda Husky 06/17/2019, 11:55 AM

## 2019-06-17 NOTE — ED Notes (Signed)
Hooked patient back up to the monitor patient is resting with call bell in reach and family at bedside

## 2019-06-17 NOTE — Progress Notes (Signed)
STROKE TEAM PROGRESS NOTE   INTERVAL HISTORY His wife is at the bedside.  I have personally reviewed history of presenting illness with the patient, his wife and electronic medical records as well as imaging films in PACS.  He presented with sudden onset of painless vision loss in the left eye and saw ophthalmologist and was diagnosed with central retinal artery occlusion.  His vision loss persist.  He denies any other focal neurological symptoms.  MRI scan however shows multifocal embolic infarcts involving bilateral cortical and subcortical regions.  He denies any prior history of atrial fibrillation or cardiac arrhythmias. Vitals:   06/17/19 0500 06/17/19 0600 06/17/19 0700 06/17/19 0800  BP: 133/78 (!) 146/79 128/90 (!) 145/94  Pulse: 66 70 63 72  Resp: 14 (!) 23 13 (!) 21  Temp:      TempSrc:      SpO2: 96% 94% 97% 98%  Weight:      Height:        CBC:  Recent Labs  Lab 06/16/19 1152  WBC 8.1  NEUTROABS 5.5  HGB 13.4  HCT 42.5  MCV 85.0  PLT 99991111    Basic Metabolic Panel:  Recent Labs  Lab 06/16/19 1152  NA 141  K 4.1  CL 108  CO2 23  GLUCOSE 97  BUN 12  CREATININE 1.04  CALCIUM 9.2   Lipid Panel:     Component Value Date/Time   CHOL 125 06/17/2019 0409   TRIG 194 (H) 06/17/2019 0409   HDL 26 (L) 06/17/2019 0409   CHOLHDL 4.8 06/17/2019 0409   VLDL 39 06/17/2019 0409   LDLCALC 60 06/17/2019 0409   HgbA1c: No results found for: HGBA1C Urine Drug Screen: No results found for: LABOPIA, COCAINSCRNUR, LABBENZ, AMPHETMU, THCU, LABBARB  Alcohol Level No results found for: ETH  IMAGING past 24 hours CT ANGIO HEAD W OR WO CONTRAST  Result Date: 06/16/2019 CLINICAL DATA:  Stroke, follow-up. EXAM: CT ANGIOGRAPHY HEAD AND NECK TECHNIQUE: Multidetector CT imaging of the head and neck was performed using the standard protocol during bolus administration of intravenous contrast. Multiplanar CT image reconstructions and MIPs were obtained to evaluate the vascular  anatomy. Carotid stenosis measurements (when applicable) are obtained utilizing NASCET criteria, using the distal internal carotid diameter as the denominator. CONTRAST:  74mL OMNIPAQUE IOHEXOL 350 MG/ML SOLN COMPARISON:  Brain MRI 06/16/2019 FINDINGS: CT HEAD FINDINGS Brain: Punctate acute infarcts within the left postcentral gyrus, right caudate, lateral right occipital lobe, parasagittal left occipital lobe and left lateral pons were better appreciated on brain MRI performed earlier the same day 06/16/2019. No interval acute infarct is identified. No evidence of acute intracranial hemorrhage. No evidence of intracranial mass. No midline shift or extra-axial fluid collection. Mild scattered hypoattenuation within the cerebral white matter is nonspecific, but consistent with chronic small vessel ischemic disease. Mild generalized parenchymal atrophy. Vascular: Reported below. Skull: Normal. Negative for fracture or focal lesion. Sinuses: Extensive partial opacification of the right sphenoid sinus. Mild right maxillary sinus mucosal thickening. No significant mastoid effusion at the imaged levels. Orbits: No acute abnormality. Review of the MIP images confirms the above findings CTA NECK FINDINGS Aortic arch: Common origin of the innominate and left common carotid arteries. Scattered atherosclerotic plaque within the visualized aortic arch and proximal major branch vessels of the neck. No significant innominate or proximal subclavian artery stenosis. Right carotid system: CCA and ICA patent within the neck without measurable stenosis. Mild mixed plaque within the carotid bifurcation and proximal right ECA. Left  carotid system: CCA and ICA patent within the neck without measurable stenosis. Mild mixed plaque within the carotid bifurcation. Vertebral arteries: The vertebral arteries are patent within the neck bilaterally without significant stenosis. The right vertebral artery is dominant. Skeleton: No acute bony  abnormality or aggressive appearing osseous lesion. Cervical spondylosis with multilevel disc space narrowing, posterior disc osteophytes, uncovertebral and facet hypertrophy. Other neck: Status post left hemithyroidectomy. No pathologically enlarged cervical chain lymph nodes. Upper chest: No consolidation within the imaged lung apices. Review of the MIP images confirms the above findings CTA HEAD FINDINGS Anterior circulation: The intracranial internal carotid arteries are patent without significant stenosis. The M1 middle cerebral arteries are patent without significant stenosis. No M2 proximal branch occlusion or high-grade proximal stenosis is identified. Slightly hypoplastic A1 left ACA. The anterior cerebral arteries are patent without high-grade proximal stenosis. No intracranial aneurysm is identified. Posterior circulation: The non dominant intracranial left vertebral artery is developmentally diminutive, but patent. The dominant intracranial right vertebral artery is patent without significant stenosis, as is the basilar artery. The posterior cerebral arteries are patent proximally without significant stenosis. Posterior communicating arteries are poorly delineated and may be hypoplastic or absent bilaterally. Venous sinuses: Within limitations of contrast timing, no convincing thrombus. Anatomic variants: As described. Review of the MIP images confirms the above findings IMPRESSION: CT head: 1. Multiple punctate supratentorial and infratentorial acute infarcts were better appreciated on brain MRI performed earlier the same day. No CT evidence of interval acute infarct. 2. Background mild generalized parenchymal atrophy and chronic small vessel ischemic disease. 3. Right sphenoid sinusitis. CTA neck: The bilateral common carotid, internal carotid and vertebral arteries are patent within the neck without significant stenosis. Mild atherosclerotic disease within the visualized aortic arch and carotid  systems as described. CTA head No intracranial large vessel occlusion or proximal high-grade arterial stenosis. Electronically Signed   By: Kellie Simmering DO   On: 06/16/2019 18:11   CT ANGIO NECK W OR WO CONTRAST  Result Date: 06/16/2019 CLINICAL DATA:  Stroke, follow-up. EXAM: CT ANGIOGRAPHY HEAD AND NECK TECHNIQUE: Multidetector CT imaging of the head and neck was performed using the standard protocol during bolus administration of intravenous contrast. Multiplanar CT image reconstructions and MIPs were obtained to evaluate the vascular anatomy. Carotid stenosis measurements (when applicable) are obtained utilizing NASCET criteria, using the distal internal carotid diameter as the denominator. CONTRAST:  54mL OMNIPAQUE IOHEXOL 350 MG/ML SOLN COMPARISON:  Brain MRI 06/16/2019 FINDINGS: CT HEAD FINDINGS Brain: Punctate acute infarcts within the left postcentral gyrus, right caudate, lateral right occipital lobe, parasagittal left occipital lobe and left lateral pons were better appreciated on brain MRI performed earlier the same day 06/16/2019. No interval acute infarct is identified. No evidence of acute intracranial hemorrhage. No evidence of intracranial mass. No midline shift or extra-axial fluid collection. Mild scattered hypoattenuation within the cerebral white matter is nonspecific, but consistent with chronic small vessel ischemic disease. Mild generalized parenchymal atrophy. Vascular: Reported below. Skull: Normal. Negative for fracture or focal lesion. Sinuses: Extensive partial opacification of the right sphenoid sinus. Mild right maxillary sinus mucosal thickening. No significant mastoid effusion at the imaged levels. Orbits: No acute abnormality. Review of the MIP images confirms the above findings CTA NECK FINDINGS Aortic arch: Common origin of the innominate and left common carotid arteries. Scattered atherosclerotic plaque within the visualized aortic arch and proximal major branch vessels of  the neck. No significant innominate or proximal subclavian artery stenosis. Right carotid system: CCA and ICA patent  within the neck without measurable stenosis. Mild mixed plaque within the carotid bifurcation and proximal right ECA. Left carotid system: CCA and ICA patent within the neck without measurable stenosis. Mild mixed plaque within the carotid bifurcation. Vertebral arteries: The vertebral arteries are patent within the neck bilaterally without significant stenosis. The right vertebral artery is dominant. Skeleton: No acute bony abnormality or aggressive appearing osseous lesion. Cervical spondylosis with multilevel disc space narrowing, posterior disc osteophytes, uncovertebral and facet hypertrophy. Other neck: Status post left hemithyroidectomy. No pathologically enlarged cervical chain lymph nodes. Upper chest: No consolidation within the imaged lung apices. Review of the MIP images confirms the above findings CTA HEAD FINDINGS Anterior circulation: The intracranial internal carotid arteries are patent without significant stenosis. The M1 middle cerebral arteries are patent without significant stenosis. No M2 proximal branch occlusion or high-grade proximal stenosis is identified. Slightly hypoplastic A1 left ACA. The anterior cerebral arteries are patent without high-grade proximal stenosis. No intracranial aneurysm is identified. Posterior circulation: The non dominant intracranial left vertebral artery is developmentally diminutive, but patent. The dominant intracranial right vertebral artery is patent without significant stenosis, as is the basilar artery. The posterior cerebral arteries are patent proximally without significant stenosis. Posterior communicating arteries are poorly delineated and may be hypoplastic or absent bilaterally. Venous sinuses: Within limitations of contrast timing, no convincing thrombus. Anatomic variants: As described. Review of the MIP images confirms the above  findings IMPRESSION: CT head: 1. Multiple punctate supratentorial and infratentorial acute infarcts were better appreciated on brain MRI performed earlier the same day. No CT evidence of interval acute infarct. 2. Background mild generalized parenchymal atrophy and chronic small vessel ischemic disease. 3. Right sphenoid sinusitis. CTA neck: The bilateral common carotid, internal carotid and vertebral arteries are patent within the neck without significant stenosis. Mild atherosclerotic disease within the visualized aortic arch and carotid systems as described. CTA head No intracranial large vessel occlusion or proximal high-grade arterial stenosis. Electronically Signed   By: Kellie Simmering DO   On: 06/16/2019 18:11   MR Brain Wo Contrast (neuro protocol)  Result Date: 06/16/2019 CLINICAL DATA:  Left vision loss EXAM: MRI HEAD WITHOUT CONTRAST TECHNIQUE: Multiplanar, multiecho pulse sequences of the brain and surrounding structures were obtained without intravenous contrast. COMPARISON:  None. FINDINGS: Brain: Punctate foci of reduced diffusion are identified within the left postcentral gyrus, right caudate, lateral right occipital lobe, parasagittal left occipital lobe, and left lateral pons. Punctate focus of susceptibility in the right posterior corona radiata is compatible with chronic microhemorrhage or mineralization. Few scattered small foci of T2 hyperintensity in the supratentorial white matter are nonspecific but may reflect mild chronic microvascular ischemic changes. Question of atrophy of the right optic tract just posterior to the chiasm. Ventricles and sulci are normal in size and configuration. There is no intracranial mass or mass effect. There is no hydrocephalus or extra-axial fluid collection. Vascular: Major vessel flow voids at the skull base are preserved. Skull and upper cervical spine: Normal marrow signal is preserved. Sinuses/Orbits: Mild circumferential sphenoid mucosal thickening.  Minor maxillary and ethmoid mucosal thickening. Bilateral lens replacements. Other: Sella is unremarkable.  Mastoid air cells are clear. IMPRESSION: Several punctate acute infarcts likely secondary to emboli from central source. Question of atrophy of the right optic tract just posterior to the chiasm. This is not well evaluated and may be artifactual. Electronically Signed   By: Macy Mis M.D.   On: 06/16/2019 14:39   ECHOCARDIOGRAM COMPLETE  Result Date: 06/16/2019  ECHOCARDIOGRAM REPORT   Patient Name:   DEVVON ARNWINE Pelster Date of Exam: 06/16/2019 Medical Rec #:  [id number]    Height:       70.0 in Accession #:    [id number]   Weight:       225.0 lb Date of Birth:  Oct 25, 1950    BSA:          2.194 m Patient Age:    28 years     BP:           125/80 mmHg Patient Gender: M            HR:           68 bpm. Exam Location:  Inpatient Procedure: 2D Echo, Cardiac Doppler and Color Doppler Indications:    Stroke 434.91 / I163.9  History:        Patient has prior history of Echocardiogram examinations, most                 recent 04/01/2012. Arrythmias:RBBB, Signs/Symptoms:Murmur and                 Dyspnea; Risk Factors:Dyslipidemia. Mitral Valve Repair. Thyroid                 Disease.  Sonographer:    Tiffany Dance Referring Phys: [id number] RONDELL A SMITH IMPRESSIONS  1. Left ventricular ejection fraction, by estimation, is 50 to 55%. The left ventricle has low normal function. The left ventricle has no regional wall motion abnormalities. The left ventricular internal cavity size was mildly dilated. There is severe asymmetric left ventricular hypertrophy. Left ventricular diastolic parameters are consistent with Grade I diastolic dysfunction (impaired relaxation).  2. Right ventricular systolic function is normal. The right ventricular size is normal. There is normal pulmonary artery systolic pressure.  3. Left atrial size was severely dilated.  4. The mitral valve is degenerative. Trivial mitral valve regurgitation.  No evidence of mitral stenosis.  5. The aortic valve is tricuspid. Aortic valve regurgitation is trivial. No aortic stenosis is present.  6. Aortic dilatation noted. There is mild dilatation of the ascending aorta.  7. The inferior vena cava is normal in size with greater than 50% respiratory variability, suggesting right atrial pressure of 3 mmHg. FINDINGS  Left Ventricle: Severe posterior hypertrophy with otherwise mild concentric LVH. Left ventricular ejection fraction, by estimation, is 50 to 55%. The left ventricle has low normal function. The left ventricle has no regional wall motion abnormalities. The left ventricular internal cavity size was mildly dilated. There is severe asymmetric left ventricular hypertrophy. Left ventricular diastolic parameters are consistent with Grade I diastolic dysfunction (impaired relaxation). Right Ventricle: The right ventricular size is normal. No increase in right ventricular wall thickness. Right ventricular systolic function is normal. There is normal pulmonary artery systolic pressure. The tricuspid regurgitant velocity is 1.31 m/s, and  with an assumed right atrial pressure of 3 mmHg, the estimated right ventricular systolic pressure is 9.9 mmHg. Left Atrium: Left atrial size was severely dilated. Right Atrium: Right atrial size was normal in size. Pericardium: There is no evidence of pericardial effusion. Mitral Valve: The mitral valve is degenerative in appearance. Normal mobility of the mitral valve leaflets. Severe mitral annular calcification. Trivial mitral valve regurgitation. No evidence of mitral valve stenosis. MV peak gradient, 9.1 mmHg. The mean mitral valve gradient is 3.7 mmHg. Tricuspid Valve: The tricuspid valve is normal in structure. Tricuspid valve regurgitation is trivial. No evidence of tricuspid stenosis. Aortic Valve: The  aortic valve is tricuspid. Aortic valve regurgitation is trivial. Aortic regurgitation PHT measures 787 msec. No aortic stenosis  is present. Pulmonic Valve: The pulmonic valve was normal in structure. Pulmonic valve regurgitation is mild. No evidence of pulmonic stenosis. Aorta: The aortic root is normal in size and structure and aortic dilatation noted. There is mild dilatation of the ascending aorta. Venous: The inferior vena cava is normal in size with greater than 50% respiratory variability, suggesting right atrial pressure of 3 mmHg. IAS/Shunts: No atrial level shunt detected by color flow Doppler.  LEFT VENTRICLE PLAX 2D LVIDd:         5.50 cm LVIDs:         4.00 cm LV PW:         1.70 cm LV IVS:        1.20 cm LVOT diam:     2.30 cm LV SV:         72 LV SV Index:   33 LVOT Area:     4.15 cm  RIGHT VENTRICLE            IVC RV Basal diam:  2.70 cm    IVC diam: 1.70 cm RV S prime:     8.06 cm/s TAPSE (M-mode): 2.1 cm LEFT ATRIUM             Index       RIGHT ATRIUM           Index LA diam:        5.30 cm 2.42 cm/m  RA Area:     10.00 cm LA Vol (A2C):   86.7 ml 39.51 ml/m RA Volume:   18.20 ml  8.29 ml/m LA Vol (A4C):   96.1 ml 43.79 ml/m LA Biplane Vol: 94.7 ml 43.15 ml/m  AORTIC VALVE LVOT Vmax:   76.00 cm/s LVOT Vmean:  52.200 cm/s LVOT VTI:    0.173 m AI PHT:      787 msec  AORTA Ao Root diam: 4.70 cm Ao Asc diam:  3.70 cm MITRAL VALVE                TRICUSPID VALVE MV Area (PHT): 1.96 cm     TR Peak grad:   6.9 mmHg MV Peak grad:  9.1 mmHg     TR Vmax:        131.00 cm/s MV Mean grad:  3.7 mmHg MV Vmax:       1.51 m/s     SHUNTS MV Vmean:      371.0 cm/s   Systemic VTI:  0.17 m MV VTI:        0.48 m       Systemic Diam: 2.30 cm MV PHT:        113.00 msec MV Decel Time: 387 msec MV E velocity: 94.30 cm/s MV A velocity: 116.00 cm/s MV E/A ratio:  0.81 Skeet Latch MD Electronically signed by Skeet Latch MD Signature Date/Time: 06/16/2019/10:34:09 PM    Final    B-Scan Ultrasound - OS - Left Eye  Result Date: 06/16/2019 Quality was good. Findings included posterior vitreous detachment, vitreous opacities. Notes No  retinal detachment evident  Fluorescein Angiography Optos (Transit OS)  Result Date: 06/16/2019 Right Eye Early phase findings include normal observations. Mid/Late phase findings include normal observations. Choroidal neovascularization is not present. Left Eye Early phase findings include blockage, delayed filling. Mid/Late phase findings include blockage, delayed filling. Notes Central retinal artery occlusion of the left eye with near complete  blockage. Extensive retinal nonperfusion and high risk for neovascularization OS.  Color Fundus Photography Optos - OU - Both Eyes  Result Date: 06/16/2019 Right Eye Disc findings include normal observations. Macula : normal observations. Periphery : normal observations. Left Eye Disc findings include pallor. Macula : flat. Vessels : attenuated. Periphery : normal observations. Notes OS, with cherry-red spot due to diffuse macular retinal whitening. Attenuated central retinal artery.  VAS [place] LOWER EXTREMITY VENOUS (DVT)  Result Date: 06/17/2019  Lower Venous DVTStudy Indications: Edema.  Comparison Study: no prior Performing Technologist: Abram Sander RVS  Examination Guidelines: A complete evaluation includes B-mode imaging, spectral Doppler, color Doppler, and power Doppler as needed of all accessible portions of each vessel. Bilateral testing is considered an integral part of a complete examination. Limited examinations for reoccurring indications may be performed as noted. The reflux portion of the exam is performed with the patient in reverse Trendelenburg.  +---------+---------------+---------+-----------+----------+--------------+ RIGHT    CompressibilityPhasicitySpontaneityPropertiesThrombus Aging +---------+---------------+---------+-----------+----------+--------------+ CFV      Full           Yes      Yes                                 +---------+---------------+---------+-----------+----------+--------------+ SFJ      Full                                                         +---------+---------------+---------+-----------+----------+--------------+ FV Prox  Full                                                        +---------+---------------+---------+-----------+----------+--------------+ FV Mid   Full                                                        +---------+---------------+---------+-----------+----------+--------------+ FV DistalFull                                                        +---------+---------------+---------+-----------+----------+--------------+ PFV      Full                                                        +---------+---------------+---------+-----------+----------+--------------+ POP      Full           Yes      Yes                                 +---------+---------------+---------+-----------+----------+--------------+ PTV      Full                                                        +---------+---------------+---------+-----------+----------+--------------+  PERO     Full                                                        +---------+---------------+---------+-----------+----------+--------------+   +---------+---------------+---------+-----------+----------+--------------+ LEFT     CompressibilityPhasicitySpontaneityPropertiesThrombus Aging +---------+---------------+---------+-----------+----------+--------------+ CFV      Full           Yes      Yes                                 +---------+---------------+---------+-----------+----------+--------------+ SFJ      Full                                                        +---------+---------------+---------+-----------+----------+--------------+ FV Prox  Full                                                        +---------+---------------+---------+-----------+----------+--------------+ FV Mid   Full                                                         +---------+---------------+---------+-----------+----------+--------------+ FV DistalFull                                                        +---------+---------------+---------+-----------+----------+--------------+ PFV      Full                                                        +---------+---------------+---------+-----------+----------+--------------+ POP      Full           Yes      Yes                                 +---------+---------------+---------+-----------+----------+--------------+ PTV      Full                                                        +---------+---------------+---------+-----------+----------+--------------+ PERO     Full                                                        +---------+---------------+---------+-----------+----------+--------------+  Summary: BILATERAL: - No evidence of deep vein thrombosis seen in the lower extremities, bilaterally.   *See table(s) above for measurements and observations.    Preliminary     PHYSICAL EXAM Pleasant elderly Caucasian male not in distress. . Afebrile. Head is nontraumatic. Neck is supple without bruit.    Cardiac exam no murmur or gallop. Lungs are clear to auscultation. Distal pulses are well felt. Neurological Exam ;  Awake  Alert oriented x 3. Normal speech and language.eye movements full without nystagmus.fundi were not visualized. Vision acuity is normal in the right eye but he is blind in the left eye with no light perception.  Left pupil is larger than right and nonreactive.  Fundi were not visualized.  Visual fields appear normal in right eye.Marland Kitchen Hearing is normal. Palatal movements are normal. Face symmetric. Tongue midline. Normal strength, tone, reflexes and coordination. Normal sensation. Gait deferred.   ASSESSMENT/PLAN [name] is a 69 y.o. male with history of mitral regurg s/p repair in 2001, HLD, palpitations (SVT), migraines presenting with sudden onset L  eye vision loss Fri night at 8p without return of vision, no other neuro sx.   Stroke: Silent B punctate infarcts embolic secondary to unknown source, high risk for atrial fibrillation   MRI  Several scattered punctate infarcts  CT head no infarcts seen. Small vessel disease. Atrophy. Sinus dz.  CTA head Unremarkable   CTA neck atherosclerosis   2D Echo EF 50-55%. No source of embolus   TEE to look for embolic source. Arranged with [place] for tomorrow.  If positive for PFO (patent foramen ovale), check bilateral lower extremity venous dopplers to rule out DVT as possible source of stroke. (I have made patient NPO after midnight tonight).  If TEE negative, a [place] electrophysiologist will consult and consider placement of an implantable loop recorder to evaluate for atrial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

## 2019-06-17 NOTE — ED Notes (Signed)
Breakfast ordered 

## 2019-06-17 NOTE — ED Notes (Signed)
Lunch Tray Ordered @ 1026.

## 2019-06-17 NOTE — ED Notes (Signed)
Walked patient to the bathroom patient did well 

## 2019-06-17 NOTE — Progress Notes (Signed)
    [facility] has been requested to perform a transesophageal echocardiogram on Alec Maldonado for stroke.  After careful review of history and examination, the risks and benefits of transesophageal echocardiogram have been explained including risks of esophageal damage, perforation (1:10,000 risk), bleeding, pharyngeal hematoma as well as other potential complications associated with conscious sedation including aspiration, arrhythmia, respiratory failure and death. Alternatives to treatment were discussed, questions were answered. Patient is willing to proceed.   Kathyrn Drown, NP  06/17/2019 4:47 PM

## 2019-06-17 NOTE — ED Notes (Signed)
ED TO INPATIENT HANDOFF REPORT  ED Nurse Name and Phone #:  [phone] [place] Name/Age/Gender Alec Maldonado 69 y.o. male Room/Bed: 007C/007C  Code Status   Code Status: Full Code  Home/SNF/Other Home Patient oriented to: self, place, time and situation Is this baseline? Yes   Triage Complete: Triage complete  Chief Complaint Central retinal artery occlusion, left [H34.12] Retinal artery occlusion, central, left [H34.12]  Triage Note Pt loss vision in L eye on Friday.  Seen by Dr. Zadie Rhine this morning for central retinal artery occlusion of L eye.  Sent to ED for stroke work-up.  Denies numbness or weakness.    Allergies No Known Allergies  Level of Care/Admitting Diagnosis ED Disposition    ED Disposition Condition [facility] Area: [place] [[id number]]  Level of Care: Telemetry Medical [[id number]]  Covid Evaluation: Asymptomatic Screening Protocol (No Symptoms)  Diagnosis: Retinal artery occlusion, central, left [id number]  Admitting Physician: Norval Morton [id number]  Attending Physician: Norval Morton [id number]  Estimated length of stay: past midnight tomorrow  Certification:: I certify this patient will need inpatient services for at least 2 midnights       B Medical/Surgery History Past Medical History:  Diagnosis Date  . Allergy   . Aortic insufficiency    Mild, echo, January, 2014  . BCC (basal cell carcinoma), back 2017   s/p excisional biospy by derm  . BPH (benign prostatic hypertrophy)   . Chest discomfort    nuclear 06/24/09, suggestion prior small inferior MI with moderate peri-infarvt ischemia versus variable diaphragmatic attenuation  . Colon polyps    colonoscopy 07/02/06, repeat in 10 years  . Dyslipidemia   . Ejection fraction    EF 55%, echo, October, 2010  //   EF 45-50%,  catheterization, 2011  . Heart murmur   . Hyperlipidemia   . Migraines   . Mitral regurgitation    Mitral valve repair 2001  .  Neurofibroma of foot 04/2017   s/p removal by derm  . Palpitations 07/2009    helped with carvedilol   . Pleurisy   . Postural dizziness    mild, no orthostatic changes by blood pressure check  . Pre-syncope    January, 2014  . RBBB    First seen January, 2014  . Right ventricular dysfunction    Mild/moderate right ventricular dysfunction by echo, January, 2014, no tricuspid regurgitation, therefore right heart pressure could not be estimated.  . Shortness of breath   . Status post mitral valve repair    2001,  //  echo, October, 2010, good function of the repaired mitral valve  . SVT (supraventricular tachycardia) ([place]) 03/11/2013   Isolated episodes - last 03/2013 after flu treated with adenosine cardioversion in ER.   Marland Kitchen Thyroid disease    Past Surgical History:  Procedure Laterality Date  . aneurysmal dilatation     proximal LAD cath 2001, reoeat catheter 2004, no significant change  . aneurysmal dilatiation  07/15/09   no change, normal right heart filling pressures and LVEDP- EF 45-50%  . APPENDECTOMY  1970  . CARDIAC CATHETERIZATION  2010   mild global LV dysfunction EF 45-50%, no significant CAD obstruction, aneurysmal dilatation LAD  . COLONOSCOPY  2008  . COLONOSCOPY  06/2016   diverticulosis, rpt 10 yrs Ardis Hughs)  . CORONARY ANGIOPLASTY  09/1999   severe MR o/w normal limits  . CORONARY ANGIOPLASTY  12/19/02   pos. CAD, mild LV dysfunction- med  treatment  . EYE SURGERY Left   . HERNIA REPAIR  01/11/05   left, Dr. Hassell Done  . I & D EXTREMITY Right 02/04/2015   Procedure: IRRIGATION AND DEBRIDEMENT RIGHT INDEX FINGER OPEN FRACTURE AND REPAIR DIGITAL NERVE AND ARTERY;  Surgeon: Leanora Cover, MD;  Location: [facility] [facility];  Service: Orthopedics;  Laterality: Right;  . MITRAL VALVE REPAIR  01/22/00   good mitral valve function- echo 12/2008- EF 55% / nuclear 06/2009- EF 36%,? reliable? / EF 45-50%, cath rated 02/2010  . ORIF ELBOW FRACTURE     left  . POLYPECTOMY    . THYROID SURGERY     . THYROIDECTOMY     left, due to goiter 1994     A IV Location/Drains/Wounds Patient Lines/Drains/Airways Status   Active Line/Drains/Airways    Name:   Placement date:   Placement time:   Site:   Days:   Peripheral IV 06/16/19 Left Antecubital   06/16/19    1219    Antecubital   1   Incision (Closed) 02/04/15 Hand Right   02/04/15    2211     1594          Intake/Output Last 24 hours  Intake/Output Summary (Last 24 hours) at 06/17/2019 1633 Last data filed at 06/17/2019 0230 Gross per 24 hour  Intake --  Output 950 ml  Net -950 ml    Labs/Imaging Results for orders placed or performed during the hospital encounter of 06/16/19 (from the past 48 hour(s))  Protime-INR     Status: None   Collection Time: 06/16/19 11:52 AM  Result Value Ref Range   Prothrombin Time 12.9 11.4 - 15.2 seconds   INR 1.0 0.8 - 1.2    Comment: (NOTE) INR goal varies based on device and disease states. Performed at [facility], [place] [address] [place], [place] [address]   APTT     Status: None   Collection Time: 06/16/19 11:52 AM  Result Value Ref Range   aPTT 30 24 - 36 seconds    Comment: Performed at [facility] [address] [place] [address]  CBC     Status: None   Collection Time: 06/16/19 11:52 AM  Result Value Ref Range   WBC 8.1 4.0 - 10.5 K/uL   RBC 5.00 4.22 - 5.81 MIL/uL   Hemoglobin 13.4 13.0 - 17.0 g/dL   HCT 42.5 39.0 - 52.0 %   MCV 85.0 80.0 - 100.0 fL   MCH 26.8 26.0 - 34.0 pg   MCHC 31.5 30.0 - 36.0 g/dL   RDW 13.1 11.5 - 15.5 %   Platelets 262 150 - 400 K/uL   nRBC 0.0 0.0 - 0.2 %    Comment: Performed at [facility], [place] [address] [place], [place] [address]  Differential     Status: None   Collection Time: 06/16/19 11:52 AM  Result Value Ref Range   Neutrophils Relative % 67 %   Neutro Abs 5.5 1.7 - 7.7 K/uL   Lymphocytes Relative 21 %   Lymphs Abs 1.7 0.7 - 4.0 K/uL   Monocytes Relative 9 %   Monocytes Absolute 0.8 0.1 - 1.0  K/uL   Eosinophils Relative 1 %   Eosinophils Absolute 0.1 0.0 - 0.5 K/uL   Basophils Relative 1 %   Basophils Absolute 0.0 0.0 - 0.1 K/uL   Immature Granulocytes 1 %   Abs Immature Granulocytes 0.06 0.00 - 0.07 K/uL    Comment:  Performed at [facility], [place] [address] [place], [place] [address]  Comprehensive metabolic panel     Status: None   Collection Time: 06/16/19 11:52 AM  Result Value Ref Range   Sodium 141 135 - 145 mmol/L   Potassium 4.1 3.5 - 5.1 mmol/L   Chloride 108 98 - 111 mmol/L   CO2 23 22 - 32 mmol/L   Glucose, Bld 97 70 - 99 mg/dL    Comment: Glucose reference range applies only to samples taken after fasting for at least 8 hours.   BUN 12 8 - 23 mg/dL   Creatinine, Ser 1.04 0.61 - 1.24 mg/dL   Calcium 9.2 8.9 - 10.3 mg/dL   Total Protein 7.0 6.5 - 8.1 g/dL   Albumin 4.1 3.5 - 5.0 g/dL   AST 20 15 - 41 U/L   ALT 21 0 - 44 U/L   Alkaline Phosphatase 59 38 - 126 U/L   Total Bilirubin 0.6 0.3 - 1.2 mg/dL   GFR calc non Af Amer >60 >60 mL/min   GFR calc Af Amer >60 >60 mL/min   Anion gap 10 5 - 15    Comment: Performed at [facility], [place] [address], [place], [place] [address]  CBG monitoring, ED     Status: None   Collection Time: 06/16/19 12:22 PM  Result Value Ref Range   Glucose-Capillary 86 70 - 99 mg/dL    Comment: Glucose reference range applies only to samples taken after fasting for at least 8 hours.  SARS CORONAVIRUS 2 (TAT 6-24 HRS) Nasopharyngeal Nasopharyngeal Swab     Status: None   Collection Time: 06/16/19 12:51 PM   Specimen: Nasopharyngeal Swab  Result Value Ref Range   SARS Coronavirus 2 NEGATIVE NEGATIVE    Comment: (NOTE) SARS-CoV-2 target nucleic acids are NOT DETECTED. The SARS-CoV-2 RNA is generally detectable in upper and lower respiratory specimens during the acute phase of infection. Negative results do not preclude SARS-CoV-2 infection, do not rule out co-infections with other pathogens, and should not be used as  the sole basis for treatment or other patient management decisions. Negative results must be combined with clinical observations, patient history, and epidemiological information. The expected result is Negative. Fact Sheet for Patients: SugarRoll.be Fact Sheet for Healthcare Providers: https://www.woods-mathews.com/ This test is not yet approved or cleared by the [place] FDA and  has been authorized for detection and/or diagnosis of SARS-CoV-2 by FDA under an Emergency Use Authorization (EUA). This EUA will remain  in effect (meaning this test can be used) for the duration of the COVID-19 declaration under Section 56 4(b)(1) of the Act, 21 U.S.C. section 360bbb-3(b)(1), unless the authorization is terminated or revoked sooner. Performed at [facility], [place] [address] [place], [place] [address]   HIV Antibody (routine testing w rflx)     Status: None   Collection Time: 06/16/19  5:17 PM  Result Value Ref Range   HIV Screen 4th Generation wRfx NON REACTIVE NON REACTIVE    Comment: Performed at [facility] [address], [place], [place] [address]  Lipid panel     Status: Abnormal   Collection Time: 06/17/19  4:09 AM  Result Value Ref Range   Cholesterol 125 0 - 200 mg/dL   Triglycerides 194 (H) <150 mg/dL   HDL 26 (L) >40 mg/dL   Total CHOL/HDL Ratio 4.8 RATIO   VLDL 39 0 - 40 mg/dL   LDL Cholesterol 60 0 -  99 mg/dL    Comment:        Total Cholesterol/HDL:CHD Risk Coronary Heart Disease Risk Table                     Men   Women  1/2 Average Risk   3.4   3.3  Average Risk       5.0   4.4  2 X Average Risk   9.6   7.1  3 X Average Risk  23.4   11.0        Use the calculated Patient Ratio above and the CHD Risk Table to determine the patient's CHD Risk.        ATP III CLASSIFICATION (LDL):  <100     mg/dL   Optimal  100-129  mg/dL   Near or Above                    Optimal  130-159  mg/dL   Borderline   160-189  mg/dL   High  >190     mg/dL   Very High Performed at [facility] [address] [place], [place] [address]    CT ANGIO HEAD W OR WO CONTRAST  Result Date: 06/16/2019 CLINICAL DATA:  Stroke, follow-up. EXAM: CT ANGIOGRAPHY HEAD AND NECK TECHNIQUE: Multidetector CT imaging of the head and neck was performed using the standard protocol during bolus administration of intravenous contrast. Multiplanar CT image reconstructions and MIPs were obtained to evaluate the vascular anatomy. Carotid stenosis measurements (when applicable) are obtained utilizing NASCET criteria, using the distal internal carotid diameter as the denominator. CONTRAST:  4mL OMNIPAQUE IOHEXOL 350 MG/ML SOLN COMPARISON:  Brain MRI 06/16/2019 FINDINGS: CT HEAD FINDINGS Brain: Punctate acute infarcts within the left postcentral gyrus, right caudate, lateral right occipital lobe, parasagittal left occipital lobe and left lateral pons were better appreciated on brain MRI performed earlier the same day 06/16/2019. No interval acute infarct is identified. No evidence of acute intracranial hemorrhage. No evidence of intracranial mass. No midline shift or extra-axial fluid collection. Mild scattered hypoattenuation within the cerebral white matter is nonspecific, but consistent with chronic small vessel ischemic disease. Mild generalized parenchymal atrophy. Vascular: Reported below. Skull: Normal. Negative for fracture or focal lesion. Sinuses: Extensive partial opacification of the right sphenoid sinus. Mild right maxillary sinus mucosal thickening. No significant mastoid effusion at the imaged levels. Orbits: No acute abnormality. Review of the MIP images confirms the above findings CTA NECK FINDINGS Aortic arch: Common origin of the innominate and left common carotid arteries. Scattered atherosclerotic plaque within the visualized aortic arch and proximal major branch vessels of the neck. No significant innominate or proximal  subclavian artery stenosis. Right carotid system: CCA and ICA patent within the neck without measurable stenosis. Mild mixed plaque within the carotid bifurcation and proximal right ECA. Left carotid system: CCA and ICA patent within the neck without measurable stenosis. Mild mixed plaque within the carotid bifurcation. Vertebral arteries: The vertebral arteries are patent within the neck bilaterally without significant stenosis. The right vertebral artery is dominant. Skeleton: No acute bony abnormality or aggressive appearing osseous lesion. Cervical spondylosis with multilevel disc space narrowing, posterior disc osteophytes, uncovertebral and facet hypertrophy. Other neck: Status post left hemithyroidectomy. No pathologically enlarged cervical chain lymph nodes. Upper chest: No consolidation within the imaged lung apices. Review of the MIP images confirms the above findings CTA HEAD FINDINGS Anterior circulation: The intracranial internal carotid arteries are patent without significant stenosis. The  M1 middle cerebral arteries are patent without significant stenosis. No M2 proximal branch occlusion or high-grade proximal stenosis is identified. Slightly hypoplastic A1 left ACA. The anterior cerebral arteries are patent without high-grade proximal stenosis. No intracranial aneurysm is identified. Posterior circulation: The non dominant intracranial left vertebral artery is developmentally diminutive, but patent. The dominant intracranial right vertebral artery is patent without significant stenosis, as is the basilar artery. The posterior cerebral arteries are patent proximally without significant stenosis. Posterior communicating arteries are poorly delineated and may be hypoplastic or absent bilaterally. Venous sinuses: Within limitations of contrast timing, no convincing thrombus. Anatomic variants: As described. Review of the MIP images confirms the above findings IMPRESSION: CT head: 1. Multiple punctate  supratentorial and infratentorial acute infarcts were better appreciated on brain MRI performed earlier the same day. No CT evidence of interval acute infarct. 2. Background mild generalized parenchymal atrophy and chronic small vessel ischemic disease. 3. Right sphenoid sinusitis. CTA neck: The bilateral common carotid, internal carotid and vertebral arteries are patent within the neck without significant stenosis. Mild atherosclerotic disease within the visualized aortic arch and carotid systems as described. CTA head No intracranial large vessel occlusion or proximal high-grade arterial stenosis. Electronically Signed   By: Kellie Simmering DO   On: 06/16/2019 18:11   CT ANGIO NECK W OR WO CONTRAST  Result Date: 06/16/2019 CLINICAL DATA:  Stroke, follow-up. EXAM: CT ANGIOGRAPHY HEAD AND NECK TECHNIQUE: Multidetector CT imaging of the head and neck was performed using the standard protocol during bolus administration of intravenous contrast. Multiplanar CT image reconstructions and MIPs were obtained to evaluate the vascular anatomy. Carotid stenosis measurements (when applicable) are obtained utilizing NASCET criteria, using the distal internal carotid diameter as the denominator. CONTRAST:  56mL OMNIPAQUE IOHEXOL 350 MG/ML SOLN COMPARISON:  Brain MRI 06/16/2019 FINDINGS: CT HEAD FINDINGS Brain: Punctate acute infarcts within the left postcentral gyrus, right caudate, lateral right occipital lobe, parasagittal left occipital lobe and left lateral pons were better appreciated on brain MRI performed earlier the same day 06/16/2019. No interval acute infarct is identified. No evidence of acute intracranial hemorrhage. No evidence of intracranial mass. No midline shift or extra-axial fluid collection. Mild scattered hypoattenuation within the cerebral white matter is nonspecific, but consistent with chronic small vessel ischemic disease. Mild generalized parenchymal atrophy. Vascular: Reported below. Skull: Normal.  Negative for fracture or focal lesion. Sinuses: Extensive partial opacification of the right sphenoid sinus. Mild right maxillary sinus mucosal thickening. No significant mastoid effusion at the imaged levels. Orbits: No acute abnormality. Review of the MIP images confirms the above findings CTA NECK FINDINGS Aortic arch: Common origin of the innominate and left common carotid arteries. Scattered atherosclerotic plaque within the visualized aortic arch and proximal major branch vessels of the neck. No significant innominate or proximal subclavian artery stenosis. Right carotid system: CCA and ICA patent within the neck without measurable stenosis. Mild mixed plaque within the carotid bifurcation and proximal right ECA. Left carotid system: CCA and ICA patent within the neck without measurable stenosis. Mild mixed plaque within the carotid bifurcation. Vertebral arteries: The vertebral arteries are patent within the neck bilaterally without significant stenosis. The right vertebral artery is dominant. Skeleton: No acute bony abnormality or aggressive appearing osseous lesion. Cervical spondylosis with multilevel disc space narrowing, posterior disc osteophytes, uncovertebral and facet hypertrophy. Other neck: Status post left hemithyroidectomy. No pathologically enlarged cervical chain lymph nodes. Upper chest: No consolidation within the imaged lung apices. Review of the MIP images confirms the above  findings CTA HEAD FINDINGS Anterior circulation: The intracranial internal carotid arteries are patent without significant stenosis. The M1 middle cerebral arteries are patent without significant stenosis. No M2 proximal branch occlusion or high-grade proximal stenosis is identified. Slightly hypoplastic A1 left ACA. The anterior cerebral arteries are patent without high-grade proximal stenosis. No intracranial aneurysm is identified. Posterior circulation: The non dominant intracranial left vertebral artery is  developmentally diminutive, but patent. The dominant intracranial right vertebral artery is patent without significant stenosis, as is the basilar artery. The posterior cerebral arteries are patent proximally without significant stenosis. Posterior communicating arteries are poorly delineated and may be hypoplastic or absent bilaterally. Venous sinuses: Within limitations of contrast timing, no convincing thrombus. Anatomic variants: As described. Review of the MIP images confirms the above findings IMPRESSION: CT head: 1. Multiple punctate supratentorial and infratentorial acute infarcts were better appreciated on brain MRI performed earlier the same day. No CT evidence of interval acute infarct. 2. Background mild generalized parenchymal atrophy and chronic small vessel ischemic disease. 3. Right sphenoid sinusitis. CTA neck: The bilateral common carotid, internal carotid and vertebral arteries are patent within the neck without significant stenosis. Mild atherosclerotic disease within the visualized aortic arch and carotid systems as described. CTA head No intracranial large vessel occlusion or proximal high-grade arterial stenosis. Electronically Signed   By: Kellie Simmering DO   On: 06/16/2019 18:11   MR Brain Wo Contrast (neuro protocol)  Result Date: 06/16/2019 CLINICAL DATA:  Left vision loss EXAM: MRI HEAD WITHOUT CONTRAST TECHNIQUE: Multiplanar, multiecho pulse sequences of the brain and surrounding structures were obtained without intravenous contrast. COMPARISON:  None. FINDINGS: Brain: Punctate foci of reduced diffusion are identified within the left postcentral gyrus, right caudate, lateral right occipital lobe, parasagittal left occipital lobe, and left lateral pons. Punctate focus of susceptibility in the right posterior corona radiata is compatible with chronic microhemorrhage or mineralization. Few scattered small foci of T2 hyperintensity in the supratentorial white matter are nonspecific but may  reflect mild chronic microvascular ischemic changes. Question of atrophy of the right optic tract just posterior to the chiasm. Ventricles and sulci are normal in size and configuration. There is no intracranial mass or mass effect. There is no hydrocephalus or extra-axial fluid collection. Vascular: Major vessel flow voids at the skull base are preserved. Skull and upper cervical spine: Normal marrow signal is preserved. Sinuses/Orbits: Mild circumferential sphenoid mucosal thickening. Minor maxillary and ethmoid mucosal thickening. Bilateral lens replacements. Other: Sella is unremarkable.  Mastoid air cells are clear. IMPRESSION: Several punctate acute infarcts likely secondary to emboli from central source. Question of atrophy of the right optic tract just posterior to the chiasm. This is not well evaluated and may be artifactual. Electronically Signed   By: Macy Mis M.D.   On: 06/16/2019 14:39   ECHOCARDIOGRAM COMPLETE  Result Date: 06/16/2019    ECHOCARDIOGRAM REPORT   Patient Name:   JIOVANY SCHNICK Pardini Date of Exam: 06/16/2019 Medical Rec #:  [id number]    Height:       70.0 in Accession #:    [id number]   Weight:       225.0 lb Date of Birth:  01/29/1951    BSA:          2.194 m Patient Age:    13 years     BP:           125/80 mmHg Patient Gender: M            HR:  68 bpm. Exam Location:  Inpatient Procedure: 2D Echo, Cardiac Doppler and Color Doppler Indications:    Stroke 434.91 / I163.9  History:        Patient has prior history of Echocardiogram examinations, most                 recent 04/01/2012. Arrythmias:RBBB, Signs/Symptoms:Murmur and                 Dyspnea; Risk Factors:Dyslipidemia. Mitral Valve Repair. Thyroid                 Disease.  Sonographer:    Tiffany Dance Referring Phys: [id number] RONDELL A SMITH IMPRESSIONS  1. Left ventricular ejection fraction, by estimation, is 50 to 55%. The left ventricle has low normal function. The left ventricle has no regional wall motion  abnormalities. The left ventricular internal cavity size was mildly dilated. There is severe asymmetric left ventricular hypertrophy. Left ventricular diastolic parameters are consistent with Grade I diastolic dysfunction (impaired relaxation).  2. Right ventricular systolic function is normal. The right ventricular size is normal. There is normal pulmonary artery systolic pressure.  3. Left atrial size was severely dilated.  4. The mitral valve is degenerative. Trivial mitral valve regurgitation. No evidence of mitral stenosis.  5. The aortic valve is tricuspid. Aortic valve regurgitation is trivial. No aortic stenosis is present.  6. Aortic dilatation noted. There is mild dilatation of the ascending aorta.  7. The inferior vena cava is normal in size with greater than 50% respiratory variability, suggesting right atrial pressure of 3 mmHg. FINDINGS  Left Ventricle: Severe posterior hypertrophy with otherwise mild concentric LVH. Left ventricular ejection fraction, by estimation, is 50 to 55%. The left ventricle has low normal function. The left ventricle has no regional wall motion abnormalities. The left ventricular internal cavity size was mildly dilated. There is severe asymmetric left ventricular hypertrophy. Left ventricular diastolic parameters are consistent with Grade I diastolic dysfunction (impaired relaxation). Right Ventricle: The right ventricular size is normal. No increase in right ventricular wall thickness. Right ventricular systolic function is normal. There is normal pulmonary artery systolic pressure. The tricuspid regurgitant velocity is 1.31 m/s, and  with an assumed right atrial pressure of 3 mmHg, the estimated right ventricular systolic pressure is 9.9 mmHg. Left Atrium: Left atrial size was severely dilated. Right Atrium: Right atrial size was normal in size. Pericardium: There is no evidence of pericardial effusion. Mitral Valve: The mitral valve is degenerative in appearance. Normal  mobility of the mitral valve leaflets. Severe mitral annular calcification. Trivial mitral valve regurgitation. No evidence of mitral valve stenosis. MV peak gradient, 9.1 mmHg. The mean mitral valve gradient is 3.7 mmHg. Tricuspid Valve: The tricuspid valve is normal in structure. Tricuspid valve regurgitation is trivial. No evidence of tricuspid stenosis. Aortic Valve: The aortic valve is tricuspid. Aortic valve regurgitation is trivial. Aortic regurgitation PHT measures 787 msec. No aortic stenosis is present. Pulmonic Valve: The pulmonic valve was normal in structure. Pulmonic valve regurgitation is mild. No evidence of pulmonic stenosis. Aorta: The aortic root is normal in size and structure and aortic dilatation noted. There is mild dilatation of the ascending aorta. Venous: The inferior vena cava is normal in size with greater than 50% respiratory variability, suggesting right atrial pressure of 3 mmHg. IAS/Shunts: No atrial level shunt detected by color flow Doppler.  LEFT VENTRICLE PLAX 2D LVIDd:         5.50 cm LVIDs:  4.00 cm LV PW:         1.70 cm LV IVS:        1.20 cm LVOT diam:     2.30 cm LV SV:         72 LV SV Index:   33 LVOT Area:     4.15 cm  RIGHT VENTRICLE            IVC RV Basal diam:  2.70 cm    IVC diam: 1.70 cm RV S prime:     8.06 cm/s TAPSE (M-mode): 2.1 cm LEFT ATRIUM             Index       RIGHT ATRIUM           Index LA diam:        5.30 cm 2.42 cm/m  RA Area:     10.00 cm LA Vol (A2C):   86.7 ml 39.51 ml/m RA Volume:   18.20 ml  8.29 ml/m LA Vol (A4C):   96.1 ml 43.79 ml/m LA Biplane Vol: 94.7 ml 43.15 ml/m  AORTIC VALVE LVOT Vmax:   76.00 cm/s LVOT Vmean:  52.200 cm/s LVOT VTI:    0.173 m AI PHT:      787 msec  AORTA Ao Root diam: 4.70 cm Ao Asc diam:  3.70 cm MITRAL VALVE                TRICUSPID VALVE MV Area (PHT): 1.96 cm     TR Peak grad:   6.9 mmHg MV Peak grad:  9.1 mmHg     TR Vmax:        131.00 cm/s MV Mean grad:  3.7 mmHg MV Vmax:       1.51 m/s     SHUNTS  MV Vmean:      371.0 cm/s   Systemic VTI:  0.17 m MV VTI:        0.48 m       Systemic Diam: 2.30 cm MV PHT:        113.00 msec MV Decel Time: 387 msec MV E velocity: 94.30 cm/s MV A velocity: 116.00 cm/s MV E/A ratio:  0.81 Skeet Latch MD Electronically signed by Skeet Latch MD Signature Date/Time: 06/16/2019/10:34:09 PM    Final    B-Scan Ultrasound - OS - Left Eye  Result Date: 06/16/2019 Quality was good. Findings included posterior vitreous detachment, vitreous opacities. Notes No retinal detachment evident  Fluorescein Angiography Optos (Transit OS)  Result Date: 06/16/2019 Right Eye Early phase findings include normal observations. Mid/Late phase findings include normal observations. Choroidal neovascularization is not present. Left Eye Early phase findings include blockage, delayed filling. Mid/Late phase findings include blockage, delayed filling. 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 Extensive retinal nonperfusion and high risk for neovascularization OS.  Color Fundus Photography Optos - OU - Both Eyes  Result Date: 06/16/2019 Right Eye Disc findings include normal observations. Macula : normal observations. Periphery : normal observations. Left Eye Disc findings include pallor. Macula : flat. Vessels : attenuated. Periphery : normal observations. Notes OS, with cherry-red spot due to diffuse macular retinal whitening. Attenuated central retinal artery.  VAS [place] LOWER EXTREMITY VENOUS (DVT)  Result Date: 06/17/2019  Lower Venous DVTStudy Indications: Edema.  Comparison Study: no prior Performing Technologist: Abram Sander RVS  Examination Guidelines: A complete evaluation includes B-mode imaging, spectral Doppler, color Doppler, and power Doppler as needed of all accessible portions  of each vessel. Bilateral testing is considered an integral part of a complete examination. Limited examinations for reoccurring indications may be performed  as noted. The reflux portion of the exam is performed with the patient in reverse Trendelenburg.  +---------+---------------+---------+-----------+----------+--------------+ RIGHT    CompressibilityPhasicitySpontaneityPropertiesThrombus Aging +---------+---------------+---------+-----------+----------+--------------+ CFV      Full           Yes      Yes                                 +---------+---------------+---------+-----------+----------+--------------+ SFJ      Full                                                        +---------+---------------+---------+-----------+----------+--------------+ FV Prox  Full                                                        +---------+---------------+---------+-----------+----------+--------------+ FV Mid   Full                                                        +---------+---------------+---------+-----------+----------+--------------+ FV DistalFull                                                        +---------+---------------+---------+-----------+----------+--------------+ PFV      Full                                                        +---------+---------------+---------+-----------+----------+--------------+ POP      Full           Yes      Yes                                 +---------+---------------+---------+-----------+----------+--------------+ PTV      Full                                                        +---------+---------------+---------+-----------+----------+--------------+ PERO     Full                                                        +---------+---------------+---------+-----------+----------+--------------+   +---------+---------------+---------+-----------+----------+--------------+ LEFT  CompressibilityPhasicitySpontaneityPropertiesThrombus Aging +---------+---------------+---------+-----------+----------+--------------+ CFV      Full            Yes      Yes                                 +---------+---------------+---------+-----------+----------+--------------+ SFJ      Full                                                        +---------+---------------+---------+-----------+----------+--------------+ FV Prox  Full                                                        +---------+---------------+---------+-----------+----------+--------------+ FV Mid   Full                                                        +---------+---------------+---------+-----------+----------+--------------+ FV DistalFull                                                        +---------+---------------+---------+-----------+----------+--------------+ PFV      Full                                                        +---------+---------------+---------+-----------+----------+--------------+ POP      Full           Yes      Yes                                 +---------+---------------+---------+-----------+----------+--------------+ PTV      Full                                                        +---------+---------------+---------+-----------+----------+--------------+ PERO     Full                                                        +---------+---------------+---------+-----------+----------+--------------+     Summary: BILATERAL: - No evidence of deep vein thrombosis seen in the lower extremities, bilaterally.   *See table(s) above for measurements and observations. Electronically signed by Deitra Mayo MD on 06/17/2019 at 3:20:24 PM.    Final     Pending Labs Unresulted Labs (From  admission, onward)    Start     Ordered   06/17/19 0500  Hemoglobin A1c  Tomorrow morning,   R     06/16/19 1406          Vitals/Pain Today's Vitals   06/17/19 1107 06/17/19 1110 06/17/19 1200 06/17/19 1546  BP:  140/83 (!) 145/82 112/66  Pulse:  67 63 68  Resp:  18 20 (!) 23  Temp:  97.8 F (36.6  C)    TempSrc:  Oral    SpO2:  98% 98% 95%  Weight:      Height:      PainSc: 2        Isolation Precautions No active isolations  Medications Medications   stroke: mapping our early stages of recovery book (has no administration in time range)  acetaminophen (TYLENOL) tablet 650 mg (650 mg Oral Given 06/17/19 0940)    Or  acetaminophen (TYLENOL) 160 MG/5ML solution 650 mg ( Per Tube See Alternative 06/17/19 0940)    Or  acetaminophen (TYLENOL) suppository 650 mg ( Rectal See Alternative 06/17/19 0940)  senna-docusate (Senokot-S) tablet 1 tablet (has no administration in time range)  enoxaparin (LOVENOX) injection 40 mg (40 mg Subcutaneous Given 06/16/19 1700)  ezetimibe (ZETIA) tablet 10 mg (has no administration in time range)  finasteride (PROSCAR) tablet 5 mg (5 mg Oral Given 06/16/19 1931)  tamsulosin (FLOMAX) capsule 0.4 mg (0.4 mg Oral Given 06/16/19 2111)  atorvastatin (LIPITOR) tablet 80 mg (has no administration in time range)  aspirin EC tablet 81 mg (has no administration in time range)  clopidogrel (PLAVIX) tablet 75 mg (75 mg Oral Given 06/17/19 1112)  sodium chloride flush (NS) 0.9 % injection 3 mL (3 mLs Intravenous Given 06/16/19 1216)  0.9 %  sodium chloride infusion ( Intravenous Stopped 06/17/19 0621)  iohexol (OMNIPAQUE) 350 MG/ML injection 75 mL (75 mLs Intravenous Contrast Given 06/16/19 1729)    Mobility walks Low fall risk   Focused Assessments Neuro Assessment Handoff:  Swallow screen pass? Yes  Cardiac Rhythm: Normal sinus rhythm NIH Stroke Scale ( + Modified Stroke Scale Criteria)  Interval: Shift assessment Level of Consciousness (1a.)   : Alert, keenly responsive LOC Questions (1b. )   +: Answers both questions correctly LOC Commands (1c. )   + : Performs both tasks correctly Best Gaze (2. )  +: Normal Visual (3. )  +: Partial hemianopia Facial Palsy (4. )    : Normal symmetrical movements Motor Arm, Left (5a. )   +: No drift Motor Arm, Right  (5b. )   +: No drift Motor Leg, Left (6a. )   +: No drift Motor Leg, Right (6b. )   +: No drift Limb Ataxia (7. ): Absent Sensory (8. )   +: Normal, no sensory loss Best Language (9. )   +: No aphasia Dysarthria (10. ): Normal Extinction/Inattention (11.)   +: No Abnormality Modified SS Total  +: 1 Complete NIHSS TOTAL: 1     Neuro Assessment: Within Defined Limits Neuro Checks:   Initial (06/16/19 1214)  Last Documented NIHSS Modified Score: 1 (06/17/19 0400) Has TPA been given? No If patient is a Neuro Trauma and patient is going to OR before floor call report to [facility] nurse: [phone] or [phone]     R Recommendations: See Admitting Provider Note  Report given to:   Additional Notes:

## 2019-06-17 NOTE — Progress Notes (Signed)
[facility] Progress Note  Patient: Alec Maldonado    [id number]  DOA: 06/16/2019     Date of Service: the patient was seen and examined on 06/17/2019  Chief Complaint  Patient presents with  . loss of vision   Brief hospital course: HPI: [name] is a 69 y.o. male with medical history significant of mitral valve repair, aortic insufficiency, hyperlipidemia, BPH presents with complaints of vision loss out of his left eye which started approximately 3 days ago.  Patient reports that he can only small amount of light on the far left side on the left eye, but nothing along the center.  He followed up with his eye doctor the next day, and was told to return today.  He underwent fluorescein angiography which revealed central retinal artery occlusion on the left, and was advised to come to the hospital for further evaluation.  He has a prior history of mitral valve repair over 20 years ago and still intermittently has palpitations.  Denies any history of being diagnosed with atrial fibrillation and is not on any oral anticoagulant.  Denies having any significant headache, focal weakness, change in speech, chest pain, nausea, vomiting, or diarrhea symptoms.  Currently further plan is for the stroke work-up.  TEE loop recorder tomorrow likely can go home after that.  Assessment and Plan: 1.  Acute embolic CVA multiple infarcts. Central retinal artery occlusion painless. Neurology consulted. Left eye vision loss. Echocardiogram does not show any evidence of clot. DVT Doppler lower extremity negative for clot as well. Neurology recommends aspirin. TEE loop recorder tomorrow. Appreciate cardiology assistance. PT recommends no further PT work-up. Patient should not be driving on discharge.  2.  Essential hypertension On Coreg at home. We will be holding it to allow permissive hypertension.  3.  Hyperlipidemia Patient is on Zetia and simvastatin. Currently on atorvastatin.  Continue  Zetia.  4.  History of mitral valve repair 20 years ago. Monitor.  TEE tomorrow.  5.  BPH Continue Proscar and Flomax.  No complaints right now.  6.  Bilateral lower extremity edema. Recent knee surgery. Doppler negative.  Diet: Cardiac diet DVT Prophylaxis: Subcutaneous Heparin    Advance goals of care discussion: Full code  Family Communication: no family was present at bedside, at the time of interview.   Disposition:  Status is: Inpatient  Remains inpatient appropriate because:Ongoing diagnostic testing needed not appropriate for outpatient work up   Dispo: The patient is from: Home              Anticipated d/c is to: Home              Anticipated d/c date is: 1 day              Patient currently is not medically stable to d/c.  Subjective: Denies any acute complaint no nausea no vomiting no fever no chills.  Reports left eye vision loss.  No tingling no numbness.  Physical Exam: General:  alert oriented to time, place, and person.  Appear in mild distress, affect appropriate Eyes: PERRL ENT: Oral Mucosa Clear, moist  Neck: no JVD,  Cardiovascular: S1 and S2 Present, no Murmur,  Respiratory: good respiratory effort, Bilateral Air entry equal and Decreased, no Crackles, no wheezes Abdomen: Bowel Sound present, Soft and no tenderness,  Skin: no rash Extremities: bilateral Pedal edema, no calf tenderness Neurologic: without any new focal findings other than left eye vision loss Gait not checked due to patient safety concerns  Vitals:   06/17/19 0300 06/17/19 0400 06/17/19 0500 06/17/19 0600  BP: 136/84 134/88 133/78 (!) 146/79  Pulse: 67 75 66 70  Resp: (!) 21 18 14  (!) 23  Temp:      TempSrc:      SpO2: 95% 95% 96% 94%  Weight:      Height:        Intake/Output Summary (Last 24 hours) at 06/17/2019 0739 Last data filed at 06/17/2019 0230 Gross per 24 hour  Intake --  Output 950 ml  Net -950 ml   Filed Weights   06/16/19 1140  Weight: 102.1 kg     Data Reviewed: I have personally reviewed and interpreted daily labs, tele strips, imagings as discussed above. I reviewed all nursing notes, pharmacy notes, vitals, pertinent old records I have discussed plan of care as described above with RN and patient/family.  CBC: Recent Labs  Lab 06/16/19 1152  WBC 8.1  NEUTROABS 5.5  HGB 13.4  HCT 42.5  MCV 85.0  PLT 99991111   Basic Metabolic Panel: Recent Labs  Lab 06/16/19 1152  NA 141  K 4.1  CL 108  CO2 23  GLUCOSE 97  BUN 12  CREATININE 1.04  CALCIUM 9.2    Studies: CT ANGIO HEAD W OR WO CONTRAST  Result Date: 06/16/2019 CLINICAL DATA:  Stroke, follow-up. EXAM: CT ANGIOGRAPHY HEAD AND NECK TECHNIQUE: Multidetector CT imaging of the head and neck was performed using the standard protocol during bolus administration of intravenous contrast. Multiplanar CT image reconstructions and MIPs were obtained to evaluate the vascular anatomy. Carotid stenosis measurements (when applicable) are obtained utilizing NASCET criteria, using the distal internal carotid diameter as the denominator. CONTRAST:  81mL OMNIPAQUE IOHEXOL 350 MG/ML SOLN COMPARISON:  Brain MRI 06/16/2019 FINDINGS: CT HEAD FINDINGS Brain: Punctate acute infarcts within the left postcentral gyrus, right caudate, lateral right occipital lobe, parasagittal left occipital lobe and left lateral pons were better appreciated on brain MRI performed earlier the same day 06/16/2019. No interval acute infarct is identified. No evidence of acute intracranial hemorrhage. No evidence of intracranial mass. No midline shift or extra-axial fluid collection. Mild scattered hypoattenuation within the cerebral white matter is nonspecific, but consistent with chronic small vessel ischemic disease. Mild generalized parenchymal atrophy. Vascular: Reported below. Skull: Normal. Negative for fracture or focal lesion. Sinuses: Extensive partial opacification of the right sphenoid sinus. Mild right  maxillary sinus mucosal thickening. No significant mastoid effusion at the imaged levels. Orbits: No acute abnormality. Review of the MIP images confirms the above findings CTA NECK FINDINGS Aortic arch: Common origin of the innominate and left common carotid arteries. Scattered atherosclerotic plaque within the visualized aortic arch and proximal major branch vessels of the neck. No significant innominate or proximal subclavian artery stenosis. Right carotid system: CCA and ICA patent within the neck without measurable stenosis. Mild mixed plaque within the carotid bifurcation and proximal right ECA. Left carotid system: CCA and ICA patent within the neck without measurable stenosis. Mild mixed plaque within the carotid bifurcation. Vertebral arteries: The vertebral arteries are patent within the neck bilaterally without significant stenosis. The right vertebral artery is dominant. Skeleton: No acute bony abnormality or aggressive appearing osseous lesion. Cervical spondylosis with multilevel disc space narrowing, posterior disc osteophytes, uncovertebral and facet hypertrophy. Other neck: Status post left hemithyroidectomy. No pathologically enlarged cervical chain lymph nodes. Upper chest: No consolidation within the imaged lung apices. Review of the MIP images confirms the above findings CTA HEAD FINDINGS Anterior circulation: The  intracranial internal carotid arteries are patent without significant stenosis. The M1 middle cerebral arteries are patent without significant stenosis. No M2 proximal branch occlusion or high-grade proximal stenosis is identified. Slightly hypoplastic A1 left ACA. The anterior cerebral arteries are patent without high-grade proximal stenosis. No intracranial aneurysm is identified. Posterior circulation: The non dominant intracranial left vertebral artery is developmentally diminutive, but patent. The dominant intracranial right vertebral artery is patent without significant stenosis,  as is the basilar artery. The posterior cerebral arteries are patent proximally without significant stenosis. Posterior communicating arteries are poorly delineated and may be hypoplastic or absent bilaterally. Venous sinuses: Within limitations of contrast timing, no convincing thrombus. Anatomic variants: As described. Review of the MIP images confirms the above findings IMPRESSION: CT head: 1. Multiple punctate supratentorial and infratentorial acute infarcts were better appreciated on brain MRI performed earlier the same day. No CT evidence of interval acute infarct. 2. Background mild generalized parenchymal atrophy and chronic small vessel ischemic disease. 3. Right sphenoid sinusitis. CTA neck: The bilateral common carotid, internal carotid and vertebral arteries are patent within the neck without significant stenosis. Mild atherosclerotic disease within the visualized aortic arch and carotid systems as described. CTA head No intracranial large vessel occlusion or proximal high-grade arterial stenosis. Electronically Signed   By: Kellie Simmering DO   On: 06/16/2019 18:11   CT ANGIO NECK W OR WO CONTRAST  Result Date: 06/16/2019 CLINICAL DATA:  Stroke, follow-up. EXAM: CT ANGIOGRAPHY HEAD AND NECK TECHNIQUE: Multidetector CT imaging of the head and neck was performed using the standard protocol during bolus administration of intravenous contrast. Multiplanar CT image reconstructions and MIPs were obtained to evaluate the vascular anatomy. Carotid stenosis measurements (when applicable) are obtained utilizing NASCET criteria, using the distal internal carotid diameter as the denominator. CONTRAST:  56mL OMNIPAQUE IOHEXOL 350 MG/ML SOLN COMPARISON:  Brain MRI 06/16/2019 FINDINGS: CT HEAD FINDINGS Brain: Punctate acute infarcts within the left postcentral gyrus, right caudate, lateral right occipital lobe, parasagittal left occipital lobe and left lateral pons were better appreciated on brain MRI performed  earlier the same day 06/16/2019. No interval acute infarct is identified. No evidence of acute intracranial hemorrhage. No evidence of intracranial mass. No midline shift or extra-axial fluid collection. Mild scattered hypoattenuation within the cerebral white matter is nonspecific, but consistent with chronic small vessel ischemic disease. Mild generalized parenchymal atrophy. Vascular: Reported below. Skull: Normal. Negative for fracture or focal lesion. Sinuses: Extensive partial opacification of the right sphenoid sinus. Mild right maxillary sinus mucosal thickening. No significant mastoid effusion at the imaged levels. Orbits: No acute abnormality. Review of the MIP images confirms the above findings CTA NECK FINDINGS Aortic arch: Common origin of the innominate and left common carotid arteries. Scattered atherosclerotic plaque within the visualized aortic arch and proximal major branch vessels of the neck. No significant innominate or proximal subclavian artery stenosis. Right carotid system: CCA and ICA patent within the neck without measurable stenosis. Mild mixed plaque within the carotid bifurcation and proximal right ECA. Left carotid system: CCA and ICA patent within the neck without measurable stenosis. Mild mixed plaque within the carotid bifurcation. Vertebral arteries: The vertebral arteries are patent within the neck bilaterally without significant stenosis. The right vertebral artery is dominant. Skeleton: No acute bony abnormality or aggressive appearing osseous lesion. Cervical spondylosis with multilevel disc space narrowing, posterior disc osteophytes, uncovertebral and facet hypertrophy. Other neck: Status post left hemithyroidectomy. No pathologically enlarged cervical chain lymph nodes. Upper chest: No consolidation within the imaged  lung apices. Review of the MIP images confirms the above findings CTA HEAD FINDINGS Anterior circulation: The intracranial internal carotid arteries are patent  without significant stenosis. The M1 middle cerebral arteries are patent without significant stenosis. No M2 proximal branch occlusion or high-grade proximal stenosis is identified. Slightly hypoplastic A1 left ACA. The anterior cerebral arteries are patent without high-grade proximal stenosis. No intracranial aneurysm is identified. Posterior circulation: The non dominant intracranial left vertebral artery is developmentally diminutive, but patent. The dominant intracranial right vertebral artery is patent without significant stenosis, as is the basilar artery. The posterior cerebral arteries are patent proximally without significant stenosis. Posterior communicating arteries are poorly delineated and may be hypoplastic or absent bilaterally. Venous sinuses: Within limitations of contrast timing, no convincing thrombus. Anatomic variants: As described. Review of the MIP images confirms the above findings IMPRESSION: CT head: 1. Multiple punctate supratentorial and infratentorial acute infarcts were better appreciated on brain MRI performed earlier the same day. No CT evidence of interval acute infarct. 2. Background mild generalized parenchymal atrophy and chronic small vessel ischemic disease. 3. Right sphenoid sinusitis. CTA neck: The bilateral common carotid, internal carotid and vertebral arteries are patent within the neck without significant stenosis. Mild atherosclerotic disease within the visualized aortic arch and carotid systems as described. CTA head No intracranial large vessel occlusion or proximal high-grade arterial stenosis. Electronically Signed   By: Kellie Simmering DO   On: 06/16/2019 18:11   MR Brain Wo Contrast (neuro protocol)  Result Date: 06/16/2019 CLINICAL DATA:  Left vision loss EXAM: MRI HEAD WITHOUT CONTRAST TECHNIQUE: Multiplanar, multiecho pulse sequences of the brain and surrounding structures were obtained without intravenous contrast. COMPARISON:  None. FINDINGS: Brain: Punctate  foci of reduced diffusion are identified within the left postcentral gyrus, right caudate, lateral right occipital lobe, parasagittal left occipital lobe, and left lateral pons. Punctate focus of susceptibility in the right posterior corona radiata is compatible with chronic microhemorrhage or mineralization. Few scattered small foci of T2 hyperintensity in the supratentorial white matter are nonspecific but may reflect mild chronic microvascular ischemic changes. Question of atrophy of the right optic tract just posterior to the chiasm. Ventricles and sulci are normal in size and configuration. There is no intracranial mass or mass effect. There is no hydrocephalus or extra-axial fluid collection. Vascular: Major vessel flow voids at the skull base are preserved. Skull and upper cervical spine: Normal marrow signal is preserved. Sinuses/Orbits: Mild circumferential sphenoid mucosal thickening. Minor maxillary and ethmoid mucosal thickening. Bilateral lens replacements. Other: Sella is unremarkable.  Mastoid air cells are clear. IMPRESSION: Several punctate acute infarcts likely secondary to emboli from central source. Question of atrophy of the right optic tract just posterior to the chiasm. This is not well evaluated and may be artifactual. Electronically Signed   By: Macy Mis M.D.   On: 06/16/2019 14:39   ECHOCARDIOGRAM COMPLETE  Result Date: 06/16/2019    ECHOCARDIOGRAM REPORT   Patient Name:   GUERINO MARCZAK Flewellen Date of Exam: 06/16/2019 Medical Rec #:  [id number]    Height:       70.0 in Accession #:    [id number]   Weight:       225.0 lb Date of Birth:  [birth year]    BSA:          2.194 m Patient Age:    36 years     BP:           125/80 mmHg Patient Gender: M  HR:           68 bpm. Exam Location:  Inpatient Procedure: 2D Echo, Cardiac Doppler and Color Doppler Indications:    Stroke 434.91 / I163.9  History:        Patient has prior history of Echocardiogram examinations, most                  recent 04/01/2012. Arrythmias:RBBB, Signs/Symptoms:Murmur and                 Dyspnea; Risk Factors:Dyslipidemia. Mitral Valve Repair. Thyroid                 Disease.  Sonographer:    Tiffany Dance Referring Phys: [id number] RONDELL A SMITH IMPRESSIONS  1. Left ventricular ejection fraction, by estimation, is 50 to 55%. The left ventricle has low normal function. The left ventricle has no regional wall motion abnormalities. The left ventricular internal cavity size was mildly dilated. There is severe asymmetric left ventricular hypertrophy. Left ventricular diastolic parameters are consistent with Grade I diastolic dysfunction (impaired relaxation).  2. Right ventricular systolic function is normal. The right ventricular size is normal. There is normal pulmonary artery systolic pressure.  3. Left atrial size was severely dilated.  4. The mitral valve is degenerative. Trivial mitral valve regurgitation. No evidence of mitral stenosis.  5. The aortic valve is tricuspid. Aortic valve regurgitation is trivial. No aortic stenosis is present.  6. Aortic dilatation noted. There is mild dilatation of the ascending aorta.  7. The inferior vena cava is normal in size with greater than 50% respiratory variability, suggesting right atrial pressure of 3 mmHg. FINDINGS  Left Ventricle: Severe posterior hypertrophy with otherwise mild concentric LVH. Left ventricular ejection fraction, by estimation, is 50 to 55%. The left ventricle has low normal function. The left ventricle has no regional wall motion abnormalities. The left ventricular internal cavity size was mildly dilated. There is severe asymmetric left ventricular hypertrophy. Left ventricular diastolic parameters are consistent with Grade I diastolic dysfunction (impaired relaxation). Right Ventricle: The right ventricular size is normal. No increase in right ventricular wall thickness. Right ventricular systolic function is normal. There is normal pulmonary artery systolic  pressure. The tricuspid regurgitant velocity is 1.31 m/s, and  with an assumed right atrial pressure of 3 mmHg, the estimated right ventricular systolic pressure is 9.9 mmHg. Left Atrium: Left atrial size was severely dilated. Right Atrium: Right atrial size was normal in size. Pericardium: There is no evidence of pericardial effusion. Mitral Valve: The mitral valve is degenerative in appearance. Normal mobility of the mitral valve leaflets. Severe mitral annular calcification. Trivial mitral valve regurgitation. No evidence of mitral valve stenosis. MV peak gradient, 9.1 mmHg. The mean mitral valve gradient is 3.7 mmHg. Tricuspid Valve: The tricuspid valve is normal in structure. Tricuspid valve regurgitation is trivial. No evidence of tricuspid stenosis. Aortic Valve: The aortic valve is tricuspid. Aortic valve regurgitation is trivial. Aortic regurgitation PHT measures 787 msec. No aortic stenosis is present. Pulmonic Valve: The pulmonic valve was normal in structure. Pulmonic valve regurgitation is mild. No evidence of pulmonic stenosis. Aorta: The aortic root is normal in size and structure and aortic dilatation noted. There is mild dilatation of the ascending aorta. Venous: The inferior vena cava is normal in size with greater than 50% respiratory variability, suggesting right atrial pressure of 3 mmHg. IAS/Shunts: No atrial level shunt detected by color flow Doppler.  LEFT VENTRICLE PLAX 2D LVIDd:  5.50 cm LVIDs:         4.00 cm LV PW:         1.70 cm LV IVS:        1.20 cm LVOT diam:     2.30 cm LV SV:         72 LV SV Index:   33 LVOT Area:     4.15 cm  RIGHT VENTRICLE            IVC RV Basal diam:  2.70 cm    IVC diam: 1.70 cm RV S prime:     8.06 cm/s TAPSE (M-mode): 2.1 cm LEFT ATRIUM             Index       RIGHT ATRIUM           Index LA diam:        5.30 cm 2.42 cm/m  RA Area:     10.00 cm LA Vol (A2C):   86.7 ml 39.51 ml/m RA Volume:   18.20 ml  8.29 ml/m LA Vol (A4C):   96.1 ml 43.79  ml/m LA Biplane Vol: 94.7 ml 43.15 ml/m  AORTIC VALVE LVOT Vmax:   76.00 cm/s LVOT Vmean:  52.200 cm/s LVOT VTI:    0.173 m AI PHT:      787 msec  AORTA Ao Root diam: 4.70 cm Ao Asc diam:  3.70 cm MITRAL VALVE                TRICUSPID VALVE MV Area (PHT): 1.96 cm     TR Peak grad:   6.9 mmHg MV Peak grad:  9.1 mmHg     TR Vmax:        131.00 cm/s MV Mean grad:  3.7 mmHg MV Vmax:       1.51 m/s     SHUNTS MV Vmean:      371.0 cm/s   Systemic VTI:  0.17 m MV VTI:        0.48 m       Systemic Diam: 2.30 cm MV PHT:        113.00 msec MV Decel Time: 387 msec MV E velocity: 94.30 cm/s MV A velocity: 116.00 cm/s MV E/A ratio:  0.81 Skeet Latch MD Electronically signed by Skeet Latch MD Signature Date/Time: 06/16/2019/10:34:09 PM    Final    B-Scan Ultrasound - OS - Left Eye  Result Date: 06/16/2019 Quality was good. Findings included posterior vitreous detachment, vitreous opacities. Notes No retinal detachment evident  Fluorescein Angiography Optos (Transit OS)  Result Date: 06/16/2019 Right Eye Early phase findings include normal observations. Mid/Late phase findings include normal observations. Choroidal neovascularization is not present. Left Eye Early phase findings include blockage, delayed filling. Mid/Late phase findings include blockage, delayed filling. 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 Extensive retinal nonperfusion and high risk for neovascularization OS.  Color Fundus Photography Optos - OU - Both Eyes  Result Date: 06/16/2019 Right Eye Disc findings include normal observations. Macula : normal observations. Periphery : normal observations. Left Eye Disc findings include pallor. Macula : flat. Vessels : attenuated. Periphery : normal observations. Notes OS, with cherry-red spot due to diffuse macular retinal whitening. Attenuated central retinal artery.   Scheduled Meds: .  stroke: mapping our early stages of recovery book   Does not  apply Once  . aspirin  300 mg Rectal Daily   Or  . aspirin  325 mg  Oral Daily  . atorvastatin  80 mg Oral q1800  . carvedilol  6.25 mg Oral BID WC  . enoxaparin (LOVENOX) injection  40 mg Subcutaneous Q24H  . ezetimibe  10 mg Oral Daily  . finasteride  5 mg Oral Daily  . tamsulosin  0.4 mg Oral QHS   Continuous Infusions: PRN Meds: acetaminophen **OR** acetaminophen (TYLENOL) oral liquid 160 mg/5 mL **OR** acetaminophen, senna-docusate  Time spent: 35 minutes  Author: Berle Mull, MD [facility] 06/17/2019 7:39 AM  To reach On-call, see care teams to locate the attending and reach out to them via [email or web address]. If 7PM-7AM, please contact night-coverage If you still have difficulty reaching the attending provider, please page the [facility] (Director on Call) for [facility] on amion for assistance.

## 2019-06-18 ENCOUNTER — Encounter (HOSPITAL_COMMUNITY)
Admission: EM | Disposition: A | Discharge status: Home / Self Care | Payer: Self-pay | Source: Home / Self Care | Attending: Internal Medicine

## 2019-06-18 ENCOUNTER — Inpatient Hospital Stay (HOSPITAL_COMMUNITY): Payer: Medicare Other | Admitting: Anesthesiology

## 2019-06-18 ENCOUNTER — Encounter (HOSPITAL_COMMUNITY): Payer: Self-pay | Admitting: Internal Medicine

## 2019-06-18 ENCOUNTER — Inpatient Hospital Stay (HOSPITAL_COMMUNITY): Payer: Medicare Other

## 2019-06-18 DIAGNOSIS — I639 Cerebral infarction, unspecified: Secondary | ICD-10-CM

## 2019-06-18 DIAGNOSIS — I6389 Other cerebral infarction: Secondary | ICD-10-CM | POA: Diagnosis not present

## 2019-06-18 DIAGNOSIS — I340 Nonrheumatic mitral (valve) insufficiency: Secondary | ICD-10-CM

## 2019-06-18 HISTORY — PX: LOOP RECORDER INSERTION: EP1214

## 2019-06-18 HISTORY — PX: BUBBLE STUDY: SHX6837

## 2019-06-18 HISTORY — PX: TEE WITHOUT CARDIOVERSION: SHX5443

## 2019-06-18 LAB — COMPREHENSIVE METABOLIC PANEL
ALT: 18 U/L (ref 0–44)
AST: 19 U/L (ref 15–41)
Albumin: 3.5 g/dL (ref 3.5–5.0)
Alkaline Phosphatase: 57 U/L (ref 38–126)
Anion gap: 12 (ref 5–15)
BUN: 14 mg/dL (ref 8–23)
CO2: 23 mmol/L (ref 22–32)
Calcium: 8.7 mg/dL — ABNORMAL LOW (ref 8.9–10.3)
Chloride: 105 mmol/L (ref 98–111)
Creatinine, Ser: 1.14 mg/dL (ref 0.61–1.24)
GFR calc Af Amer: >60 mL/min (ref >60)
GFR calc non Af Amer: >60 mL/min (ref >60)
Glucose, Bld: 97 mg/dL (ref 70–99)
Potassium: 3.7 mmol/L (ref 3.5–5.1)
Sodium: 140 mmol/L (ref 135–145)
Total Bilirubin: 0.8 mg/dL (ref 0.3–1.2)
Total Protein: 6.3 g/dL — ABNORMAL LOW (ref 6.5–8.1)

## 2019-06-18 LAB — CBC WITH DIFFERENTIAL/PLATELET
Abs Immature Granulocytes: 0.03 10*3/uL (ref 0.00–0.07)
Basophils Absolute: 0 10*3/uL (ref 0.0–0.1)
Basophils Relative: 1 %
Eosinophils Absolute: 0.1 10*3/uL (ref 0.0–0.5)
Eosinophils Relative: 2 %
HCT: 38.1 % — ABNORMAL LOW (ref 39.0–52.0)
Hemoglobin: 12.2 g/dL — ABNORMAL LOW (ref 13.0–17.0)
Immature Granulocytes: 0 %
Lymphocytes Relative: 26 %
Lymphs Abs: 1.9 10*3/uL (ref 0.7–4.0)
MCH: 27 pg (ref 26.0–34.0)
MCHC: 32 g/dL (ref 30.0–36.0)
MCV: 84.3 fL (ref 80.0–100.0)
Monocytes Absolute: 0.8 10*3/uL (ref 0.1–1.0)
Monocytes Relative: 11 %
Neutro Abs: 4.5 10*3/uL (ref 1.7–7.7)
Neutrophils Relative %: 60 %
Platelets: 256 10*3/uL (ref 150–400)
RBC: 4.52 MIL/uL (ref 4.22–5.81)
RDW: 13.2 % (ref 11.5–15.5)
WBC: 7.4 10*3/uL (ref 4.0–10.5)
nRBC: 0 % (ref 0.0–0.2)

## 2019-06-18 LAB — HEMOGLOBIN A1C
Hgb A1c MFr Bld: 5.7 % — ABNORMAL HIGH (ref 4.8–5.6)
Mean Plasma Glucose: 117 mg/dL

## 2019-06-18 LAB — MAGNESIUM: Magnesium: 2 mg/dL (ref 1.7–2.4)

## 2019-06-18 SURGERY — ECHOCARDIOGRAM, TRANSESOPHAGEAL
Anesthesia: Monitor Anesthesia Care

## 2019-06-18 SURGERY — LOOP RECORDER INSERTION

## 2019-06-18 MED ORDER — CLOPIDOGREL BISULFATE 75 MG PO TABS: 75.0000 mg | ORAL_TABLET | Freq: Every day | ORAL | 0 refills | Status: DC

## 2019-06-18 MED ORDER — LIDOCAINE-EPINEPHRINE 1 %-1:100000 IJ SOLN
INTRAMUSCULAR | Status: DC | PRN
  Administered 2019-06-18: 20 mL

## 2019-06-18 MED ORDER — LIDOCAINE-EPINEPHRINE 1 %-1:100000 IJ SOLN
INTRAMUSCULAR | Status: AC
  Filled 2019-06-18: qty 1

## 2019-06-18 MED ORDER — PHENYLEPHRINE 40 MCG/ML (10ML) SYRINGE FOR IV PUSH (FOR BLOOD PRESSURE SUPPORT)
PREFILLED_SYRINGE | INTRAVENOUS | Status: DC | PRN
  Administered 2019-06-18: 120 ug via INTRAVENOUS

## 2019-06-18 MED ORDER — PROPOFOL 500 MG/50ML IV EMUL
INTRAVENOUS | Status: DC | PRN
  Administered 2019-06-18: 100 ug/kg/min via INTRAVENOUS

## 2019-06-18 MED ORDER — LIDOCAINE 2% (20 MG/ML) 5 ML SYRINGE
INTRAMUSCULAR | Status: DC | PRN
  Administered 2019-06-18: 60 mg via INTRAVENOUS

## 2019-06-18 SURGICAL SUPPLY — 2 items
MONITOR REVEAL LINQ II (Prosthesis & Implant Heart) ×3 IMPLANT
PACK LOOP INSERTION (CUSTOM PROCEDURE TRAY) ×3 IMPLANT

## 2019-06-18 NOTE — Anesthesia Preprocedure Evaluation (Signed)
Anesthesia Evaluation  Patient identified by MRN, date of birth, ID band Patient awake    Reviewed: Allergy & Precautions, NPO status , Patient's Chart, lab work & pertinent test results  Airway Mallampati: I  TM Distance: >3 FB Neck ROM: Full    Dental  (+) Teeth Intact, Dental Advisory Given   Pulmonary shortness of breath,    breath sounds clear to auscultation       Cardiovascular + dysrhythmias + Valvular Problems/Murmurs MR  Rhythm:Regular Rate:Normal  Echo 06/16/2019 1. Left ventricular ejection fraction, by estimation, is 50 to 55%. The left ventricle has low normal function. The left ventricle has no regional wall motion abnormalities. The left ventricular internal cavity size was mildly dilated. There is severe asymmetric left ventricular hypertrophy. Left ventricular diastolic parameters are consistent with Grade I diastolic dysfunction (impaired relaxation).  2. Right ventricular systolic function is normal. The right ventricular size is normal. There is normal pulmonary artery systolic pressure.  3. Left atrial size was severely dilated.  4. The mitral valve is degenerative. Trivial mitral valve regurgitation. No evidence of mitral stenosis.  5. The aortic valve is tricuspid. Aortic valve regurgitation is trivial. No aortic stenosis is present.  6. Aortic dilatation noted. There is mild dilatation of the ascending aorta.  7. The inferior vena cava is normal in size with greater than 50% respiratory variability, suggesting right atrial pressure of 3 mmHg.   Neuro/Psych  Headaches, CVA negative psych ROS   GI/Hepatic negative GI ROS, Neg liver ROS,   Endo/Other  negative endocrine ROS  Renal/GU negative Renal ROS     Musculoskeletal  (+) Arthritis ,   Abdominal (+) + obese,   Peds  Hematology negative hematology ROS (+)   Anesthesia Other Findings   Reproductive/Obstetrics                              Anesthesia Physical  Anesthesia Plan  ASA: III  Anesthesia Plan: MAC   Post-op Pain Management:    Induction: Intravenous  PONV Risk Score and Plan: 1 and TIVA, Propofol infusion and Treatment may vary due to age or medical condition  Airway Management Planned: Natural Airway  Additional Equipment:   Intra-op Plan:   Post-operative Plan:   Informed Consent: I have reviewed the patients History and Physical, chart, labs and discussed the procedure including the risks, benefits and alternatives for the proposed anesthesia with the patient or authorized representative who has indicated his/her understanding and acceptance.     Dental advisory given  Plan Discussed with: CRNA  Anesthesia Plan Comments:         Anesthesia Quick Evaluation

## 2019-06-18 NOTE — TOC Transition Note (Signed)
Transition of Care [facility]) - CM/SW Discharge Note   Patient Details  Name: TAYLOUR VERONICA MRN: [id number] Date of Birth: [birth year]  [facility] CM/SW Contact:  Pollie Friar, RN Phone Number: 06/18/2019, 4:19 PM   Clinical Narrative:    Pt discharging home with self care. No f/u per PT/OT and no DME needs.  Pt has transportation home.    Final next level of care: Home/Self Care Barriers to Discharge: No Barriers Identified   Patient Goals and CMS Choice        Discharge Placement                       Discharge Plan and Services                                     Social Determinants of Health (SDOH) Interventions     Readmission Risk Interventions No flowsheet data found.

## 2019-06-18 NOTE — Interval H&P Note (Signed)
History and Physical Interval Note:  06/18/2019 12:01 PM  Alec Maldonado  has presented today for surgery, with the diagnosis of stroke.  The various methods of treatment have been discussed with the patient and family. After consideration of risks, benefits and other options for treatment, the patient has consented to  Procedure(s): TRANSESOPHAGEAL ECHOCARDIOGRAM (TEE) (N/A) as a surgical intervention.  The patient's history has been reviewed, patient examined, no change in status, stable for surgery.  I have reviewed the patient's chart and labs.  Questions were answered to the patient's satisfaction.     Fransico Him

## 2019-06-18 NOTE — Consult Note (Signed)
ELECTROPHYSIOLOGY CONSULT NOTE  Patient ID: CHRISTOPHERJAME DEJOIE MRN: [id number], DOB/AGE: December 29, 1950   Admit date: 06/16/2019 Date of Consult: 06/18/2019  Primary Physician: Ria Bush, MD Primary Cardiologist: Dr. Ron Parker (retired), last seen 2014 Reason for Consultation: Cryptogenic stroke ; recommendations regarding Implantable Loop Recorder requested by Dr. Leonie Man  History of Present Illness GOLAN SEGO was admitted on 06/16/2019 with sudden painless visual loss, w/u by his opthalmologist  Noted retinal occl and referred for further w/u, found with silent strokes as well    PMHx VHD s/p MV repair (2001), HLD, SVT (converted with vagal maneuver), migraines,   Neurology noted Silent B punctate infarcts embolic secondary to unknown source, high risk for atrial fibrillation .  he has undergone workup for stroke including echocardiogram and carotid angio.  The patient has been monitored on telemetry which has demonstrated sinus rhythm with no arrhythmias.  Inpatient stroke work-up is to be completed with a TEE.    Echocardiogram this admission demonstrated IMPRESSIONS  1. Left ventricular ejection fraction, by estimation, is 50 to 55%. The  left ventricle has low normal function. The left ventricle has no regional  wall motion abnormalities. The left ventricular internal cavity size was  mildly dilated. There is severe  asymmetric left ventricular hypertrophy. Left ventricular diastolic  parameters are consistent with Grade I diastolic dysfunction (impaired  relaxation).  2. Right ventricular systolic function is normal. The right ventricular  size is normal. There is normal pulmonary artery systolic pressure.  3. Left atrial size was severely dilated. (measured 5.3cm) 4. The mitral valve is degenerative. Trivial mitral valve regurgitation.  No evidence of mitral stenosis.  5. The aortic valve is tricuspid. Aortic valve regurgitation is trivial.  No aortic stenosis is present.    6. Aortic dilatation noted. There is mild dilatation of the ascending  aorta.  7. The inferior vena cava is normal in size with greater than 50%  respiratory variability, suggesting right atrial pressure of 3 mmHg.     Lab work is reviewed.   Prior to admission, the patient denies chest pain, shortness of breath, dizziness, palpitations, or syncope.  He is recovering from the stroke with plans to home at discharge.  He has known h/o SVT, never has been known to have AF.       Past Medical History:  Diagnosis Date  . Allergy   . Aortic insufficiency    Mild, echo, January, 2014  . BCC (basal cell carcinoma), back 2017   s/p excisional biospy by derm  . BPH (benign prostatic hypertrophy)   . Chest discomfort    nuclear 06/24/09, suggestion prior small inferior MI with moderate peri-infarvt ischemia versus variable diaphragmatic attenuation  . Colon polyps    colonoscopy 07/02/06, repeat in 10 years  . Dyslipidemia   . Ejection fraction    EF 55%, echo, October, 2010  //   EF 45-50%,  catheterization, 2011  . Heart murmur   . Hyperlipidemia   . Migraines   . Mitral regurgitation    Mitral valve repair 2001  . Neurofibroma of foot 04/2017   s/p removal by derm  . Palpitations 07/2009    helped with carvedilol   . Pleurisy   . Postural dizziness    mild, no orthostatic changes by blood pressure check  . Pre-syncope    January, 2014  . RBBB    First seen January, 2014  . Right ventricular dysfunction    Mild/moderate right ventricular dysfunction by echo, January, 2014,  no tricuspid regurgitation, therefore right heart pressure could not be estimated.  . Shortness of breath   . Status post mitral valve repair    2001,  //  echo, October, 2010, good function of the repaired mitral valve  . SVT (supraventricular tachycardia) ([place]) 03/11/2013   Isolated episodes - last 03/2013 after flu treated with adenosine cardioversion in ER.   Marland Kitchen Thyroid disease      Surgical  History:  Past Surgical History:  Procedure Laterality Date  . aneurysmal dilatation     proximal LAD cath 2001, reoeat catheter 2004, no significant change  . aneurysmal dilatiation  07/15/09   no change, normal right heart filling pressures and LVEDP- EF 45-50%  . APPENDECTOMY  1970  . CARDIAC CATHETERIZATION  2010   mild global LV dysfunction EF 45-50%, no significant CAD obstruction, aneurysmal dilatation LAD  . COLONOSCOPY  2008  . COLONOSCOPY  06/2016   diverticulosis, rpt 10 yrs Ardis Hughs)  . CORONARY ANGIOPLASTY  09/1999   severe MR o/w normal limits  . CORONARY ANGIOPLASTY  12/19/02   pos. CAD, mild LV dysfunction- med treatment  . EYE SURGERY Left   . HERNIA REPAIR  01/11/05   left, Dr. Hassell Done  . I & D EXTREMITY Right 02/04/2015   Procedure: IRRIGATION AND DEBRIDEMENT RIGHT INDEX FINGER OPEN FRACTURE AND REPAIR DIGITAL NERVE AND ARTERY;  Surgeon: Leanora Cover, MD;  Location: [facility];  Service: Orthopedics;  Laterality: Right;  . MITRAL VALVE REPAIR  01/22/00   good mitral valve function- echo 12/2008- EF 55% / nuclear 06/2009- EF 36%,? reliable? / EF 45-50%, cath rated 02/2010  . ORIF ELBOW FRACTURE     left  . POLYPECTOMY    . THYROID SURGERY    . THYROIDECTOMY     left, due to goiter 1994     Medications Prior to Admission  Medication Sig Dispense Refill Last Dose  . aspirin 81 MG tablet Take 81 mg by mouth daily.   06/15/2019 at Unknown time  . butalbital-acetaminophen-caffeine (FIORICET, ESGIC) 50-325-40 MG tablet Take 1 tablet by mouth every 6 (six) hours as needed. (Patient taking differently: Take 1 tablet by mouth every 6 (six) hours as needed for headache or migraine. ) 30 tablet 1 unk  . carvedilol (COREG) 6.25 MG tablet Take 1 tablet (6.25 mg total) by mouth 2 (two) times daily with a meal. 180 tablet 3 06/15/2019 at 1900  . diclofenac Sodium (VOLTAREN) 1 % GEL Apply 2 g topically as needed (knee pain).    Past Week at Unknown time  . ezetimibe (ZETIA) 10 MG tablet TAKE  1 TABLET(10 MG) BY MOUTH DAILY (Patient taking differently: Take 10 mg by mouth daily. TAKE 1 TABLET(10 MG) BY MOUTH DAILY) 90 tablet 3 06/15/2019 at Unknown time  . finasteride (PROSCAR) 5 MG tablet Take 1 tablet (5 mg total) by mouth daily. 90 tablet 3 06/15/2019 at Unknown time  . ibuprofen (ADVIL) 200 MG tablet Take 400 mg by mouth every 6 (six) hours as needed for moderate pain.   06/14/2019  . Omega-3 Fatty Acids (FISH OIL) 1200 MG CAPS Take 1,200 mg by mouth daily. Reported on 02/23/2015   06/15/2019 at Unknown time  . simvastatin (ZOCOR) 80 MG tablet TAKE 1 TABLET(80 MG) BY MOUTH DAILY (Patient taking differently: Take 80 mg by mouth daily at 6 PM. TAKE 1 TABLET(80 MG) BY MOUTH DAILY) 90 tablet 3 06/15/2019 at Unknown time  . tamsulosin (FLOMAX) 0.4 MG CAPS capsule Take 1 capsule (0.4  mg total) by mouth at bedtime. 90 capsule 3 06/15/2019 at Unknown time    Inpatient Medications:  .  stroke: mapping our early stages of recovery book   Does not apply Once  . aspirin EC  81 mg Oral Daily  . atorvastatin  80 mg Oral q1800  . clopidogrel  75 mg Oral Daily  . enoxaparin (LOVENOX) injection  40 mg Subcutaneous Q24H  . ezetimibe  10 mg Oral Daily  . finasteride  5 mg Oral Daily  . tamsulosin  0.4 mg Oral QHS    Allergies: No Known Allergies  Social History   Socioeconomic History  . Marital status: Married    Spouse name: Not on file  . Number of children: 2  . Years of education: Not on file  . Highest education level: Not on file  Occupational History  . Occupation: Artist  Tobacco Use  . Smoking status: Never Smoker  . Smokeless tobacco: Never Used  Substance and Sexual Activity  . Alcohol use: No  . Drug use: No  . Sexual activity: Yes  Other Topics Concern  . Not on file  Social History Narrative   Widowed, wife deceased colon cancer 25-Aug-1996, remarried 2001, lives with remarried wife   Occupation: retired, worked for coffee Engineer, site   Activity: walking  30-27min 4-5d/wk   Diet: good water, fruits/vegetables daily   Social Determinants of Radio broadcast assistant Strain:   . Difficulty of Paying Living Expenses:   Food Insecurity:   . Worried About Charity fundraiser in the Last Year:   . Arboriculturist in the Last Year:   Transportation Needs:   . Film/video editor (Medical):   Marland Kitchen Lack of Transportation (Non-Medical):   Physical Activity:   . Days of Exercise per Week:   . Minutes of Exercise per Session:   Stress:   . Feeling of Stress :   Social Connections:   . Frequency of Communication with Friends and Family:   . Frequency of Social Gatherings with Friends and Family:   . Attends Religious Services:   . Active Member of Clubs or Organizations:   . Attends Archivist Meetings:   Marland Kitchen Marital Status:   Intimate Partner Violence:   . Fear of Current or Ex-Partner:   . Emotionally Abused:   Marland Kitchen Physically Abused:   . Sexually Abused:      Family History  Problem Relation Age of Onset  . Alzheimer's disease Mother   . Heart disease Mother        CHF  . Arthritis Father   . Heart disease Father        leaking mitral valve  . Benign prostatic hyperplasia Father   . Heart disease Brother        murmur  . Hypertension Neg Hx   . Diabetes Neg Hx   . Depression Neg Hx   . Alcohol abuse Neg Hx   . Drug abuse Neg Hx   . Stroke Neg Hx   . Cancer Neg Hx   . Colon cancer Neg Hx   . Esophageal cancer Neg Hx   . Rectal cancer Neg Hx   . Stomach cancer Neg Hx       Review of Systems: All other systems reviewed and are otherwise negative except as noted above.  Physical Exam: Vitals:   06/17/19 2100 06/18/19 0054 06/18/19 0354 06/18/19 0853  BP:  108/68 125/74 133/87  Pulse:  64 68 67  Resp:  18 18 18   Temp:  97.8 F (36.6 C) (!) 97.4 F (36.3 C) 98 F (36.7 C)  TempSrc:  Oral Oral Oral  SpO2: 96% 95% 95% 94%  Weight:      Height:        GEN- The patient is well appearing, alert and  oriented x 3 today.   Head- normocephalic, atraumatic Eyes-  Sclera clear, conjunctiva pink Ears- hearing intact Oropharynx- clear Neck- supple Lungs- CTA b/l, normal work of breathing Heart- RRR, soft SM, no rubs or gallops  GI- soft, NT, ND Extremities- no clubbing, cyanosis, or edema MS- no significant deformity or atrophy Skin- no rash or lesion Psych- euthymic mood, full affect   Labs:   Lab Results  Component Value Date   WBC 7.4 06/18/2019   HGB 12.2 (L) 06/18/2019   HCT 38.1 (L) 06/18/2019   MCV 84.3 06/18/2019   PLT 256 06/18/2019    Recent Labs  Lab 06/18/19 0424  NA 140  K 3.7  CL 105  CO2 23  BUN 14  CREATININE 1.14  CALCIUM 8.7*  PROT 6.3*  BILITOT 0.8  ALKPHOS 57  ALT 18  AST 19  GLUCOSE 97   Lab Results  Component Value Date   TROPONINI <0.30 03/11/2013   Lab Results  Component Value Date   CHOL 125 06/17/2019   CHOL 126 05/08/2019   CHOL 126 05/10/2018   Lab Results  Component Value Date   HDL 26 (L) 06/17/2019   HDL 32.10 (L) 05/08/2019   HDL 34.40 (L) 05/10/2018   Lab Results  Component Value Date   LDLCALC 60 06/17/2019   LDLCALC 65 05/08/2019   LDLCALC 66 05/10/2018   Lab Results  Component Value Date   TRIG 194 (H) 06/17/2019   TRIG 143.0 05/08/2019   TRIG 130.0 05/10/2018   Lab Results  Component Value Date   CHOLHDL 4.8 06/17/2019   CHOLHDL 4 05/08/2019   CHOLHDL 4 05/10/2018   No results found for: LDLDIRECT  No results found for: DDIMER   Radiology/Studies:   CT ANGIO HEAD W OR WO CONTRAST Result Date: 06/16/2019 CLINICAL DATA:  Stroke, follow-up. EXAM: CT ANGIOGRAPHY HEAD AND NECK TECHNIQUE: Multidetector CT imaging of the head and neck was performed using the standard protocol during bolus administration of intravenous contrast. Multiplanar CT image reconstructions and MIPs were obtained to evaluate the vascular anatomy. Carotid stenosis measurements (when applicable) are obtained utilizing NASCET criteria,  using the distal internal carotid diameter as the denominator. CONTRAST:  50mL OMNIPAQUE IOHEXOL 350 MG/ML SOLN COMPARISON:  Brain MRI 06/16/2019 FINDINGS: CT HEAD FINDINGS Brain: Punctate acute infarcts within the left postcentral gyrus, right caudate, lateral right occipital lobe, parasagittal left occipital lobe and left lateral pons were better appreciated on brain MRI performed earlier the same day 06/16/2019. No interval acute infarct is identified. No evidence of acute intracranial hemorrhage. No evidence of intracranial mass. No midline shift or extra-axial fluid collection. Mild scattered hypoattenuation within the cerebral white matter is nonspecific, but consistent with chronic small vessel ischemic disease. Mild generalized parenchymal atrophy. Vascular: Reported below. Skull: Normal. Negative for fracture or focal lesion. Sinuses: Extensive partial opacification of the right sphenoid sinus. Mild right maxillary sinus mucosal thickening. No significant mastoid effusion at the imaged levels. Orbits: No acute abnormality. Review of the MIP images confirms the above findings CTA NECK FINDINGS Aortic arch: Common origin of the innominate and left common carotid arteries. Scattered atherosclerotic plaque within  the visualized aortic arch and proximal major branch vessels of the neck. No significant innominate or proximal subclavian artery stenosis. Right carotid system: CCA and ICA patent within the neck without measurable stenosis. Mild mixed plaque within the carotid bifurcation and proximal right ECA. Left carotid system: CCA and ICA patent within the neck without measurable stenosis. Mild mixed plaque within the carotid bifurcation. Vertebral arteries: The vertebral arteries are patent within the neck bilaterally without significant stenosis. The right vertebral artery is dominant. Skeleton: No acute bony abnormality or aggressive appearing osseous lesion. Cervical spondylosis with multilevel disc space  narrowing, posterior disc osteophytes, uncovertebral and facet hypertrophy. Other neck: Status post left hemithyroidectomy. No pathologically enlarged cervical chain lymph nodes. Upper chest: No consolidation within the imaged lung apices. Review of the MIP images confirms the above findings CTA HEAD FINDINGS Anterior circulation: The intracranial internal carotid arteries are patent without significant stenosis. The M1 middle cerebral arteries are patent without significant stenosis. No M2 proximal branch occlusion or high-grade proximal stenosis is identified. Slightly hypoplastic A1 left ACA. The anterior cerebral arteries are patent without high-grade proximal stenosis. No intracranial aneurysm is identified. Posterior circulation: The non dominant intracranial left vertebral artery is developmentally diminutive, but patent. The dominant intracranial right vertebral artery is patent without significant stenosis, as is the basilar artery. The posterior cerebral arteries are patent proximally without significant stenosis. Posterior communicating arteries are poorly delineated and may be hypoplastic or absent bilaterally. Venous sinuses: Within limitations of contrast timing, no convincing thrombus. Anatomic variants: As described. Review of the MIP images confirms the above findings IMPRESSION: CT head: 1. Multiple punctate supratentorial and infratentorial acute infarcts were better appreciated on brain MRI performed earlier the same day. No CT evidence of interval acute infarct. 2. Background mild generalized parenchymal atrophy and chronic small vessel ischemic disease. 3. Right sphenoid sinusitis. CTA neck: The bilateral common carotid, internal carotid and vertebral arteries are patent within the neck without significant stenosis. Mild atherosclerotic disease within the visualized aortic arch and carotid systems as described. CTA head No intracranial large vessel occlusion or proximal high-grade arterial  stenosis. Electronically Signed   By: Kellie Simmering DO   On: 06/16/2019 18:11     MR Brain Wo Contrast (neuro protocol) Result Date: 06/16/2019 CLINICAL DATA:  Left vision loss EXAM: MRI HEAD WITHOUT CONTRAST TECHNIQUE: Multiplanar, multiecho pulse sequences of the brain and surrounding structures were obtained without intravenous contrast. COMPARISON:  None. FINDINGS: Brain: Punctate foci of reduced diffusion are identified within the left postcentral gyrus, right caudate, lateral right occipital lobe, parasagittal left occipital lobe, and left lateral pons. Punctate focus of susceptibility in the right posterior corona radiata is compatible with chronic microhemorrhage or mineralization. Few scattered small foci of T2 hyperintensity in the supratentorial white matter are nonspecific but may reflect mild chronic microvascular ischemic changes. Question of atrophy of the right optic tract just posterior to the chiasm. Ventricles and sulci are normal in size and configuration. There is no intracranial mass or mass effect. There is no hydrocephalus or extra-axial fluid collection. Vascular: Major vessel flow voids at the skull base are preserved. Skull and upper cervical spine: Normal marrow signal is preserved. Sinuses/Orbits: Mild circumferential sphenoid mucosal thickening. Minor maxillary and ethmoid mucosal thickening. Bilateral lens replacements. Other: Sella is unremarkable.  Mastoid air cells are clear. IMPRESSION: Several punctate acute infarcts likely secondary to emboli from central source. Question of atrophy of the right optic tract just posterior to the chiasm. This is not well evaluated  and may be artifactual. Electronically Signed   By: Macy Mis M.D.   On: 06/16/2019 14:39    B-Scan Ultrasound - OS - Left Eye Result Date: 06/16/2019 Quality was good.  Findings included posterior vitreous detachment, vitreous opacities. Notes No retinal detachment evident   Fluorescein Angiography  Optos (Transit OS) Result Date: 06/16/2019 Right Eye Early phase findings include normal observations. Mid/Late phase findings include normal observations. Choroidal neovascularization is not present. Left Eye Early phase findings include blockage, delayed filling. Mid/Late phase findings include blockage, delayed filling. 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 Extensive retinal nonperfusion and high risk for neovascularization OS.   Color Fundus Photography Optos - OU - Both Eyes Result Date: 06/16/2019 Right Eye Disc findings include normal observations. Macula : normal observations. Periphery : normal observations. Left Eye Disc findings include pallor. Macula : flat. Vessels : attenuated. Periphery : normal observations. Notes OS, with cherry-red spot due to diffuse macular retinal whitening. Attenuated central retinal artery.    VAS [place] LOWER EXTREMITY VENOUS (DVT) Result Date: 06/17/2019  Lower Venous DVTStudy Indications: Edema.  Comparison Study: no prior Performing Technologist: Abram Sander RVS  Examination Guidelines: A complete evaluation includes B-mode imaging, spectral Doppler, color Doppler, and power Doppler as needed of all accessible portions of each vessel. Bilateral testing is considered an integral part of a complete examination. Limited examinations for reoccurring indications may be performed as noted. The reflux portion of the exam is performed with the patient in reverse Trendelenburg.  Summary: BILATERAL: - No evidence of deep vein thrombosis seen in the lower extremities, bilaterally.   *See table(s) above for measurements and observations. Electronically signed by Deitra Mayo MD on 06/17/2019 at 3:20:24 PM.    Final     12-lead ECG SR, RBBB, SVT All prior EKG's in EPIC reviewed with no documented atrial fibrillation  Telemetry SR, occ PVCs, accel brief junctional beats 60's, no AF/arrhythmias  Assessment and  Plan:  1. Cryptogenic stroke The patient presents with cryptogenic stroke.  The patient has a TEE planned for today.  I spoke at length with the patient (and his wife who happens to have PAFib) about monitoring for afib with either a 30 day event monitor or an implantable loop recorder.  Risks, benefits, and alteratives to implantable loop recorder were discussed with the patient today.   At this time, the patient is very clear in his decision to proceed with implantable loop recorder, pending TEE findings.  He is not currently established with cardiology.  His wife sees Dr. Harrell Gave.  I will arrange outpatient follow up with her at thier request  Wound care was reviewed with the patient (keep incision clean and dry for 3 days).  Wound check will be scheduled for the patient of we proceed with ILR.  Please call with questions.   Baldwin Jamaica, PA-C 06/18/2019  Cryptogenic Stroke  ? Asymmetric hypertrophic heart disease  SVT    Mitral Valve repair     Pt has multiple lesions most consistent with embolic? Source  Have discussed the role of the linq monitor to identify silent atrial fibrillation, the presumptive implications regarding change of antiplatelet therapy to anticoagulation.  They voice understanding  Will proceed following TEE if no clot is identified  Also discussed the implication of assymetric hypertrophy as a possible false positive finding, based on echo 2014 or hypertrophic cardiomyopathy or some other infiltrative process.  Would proceed with cMRI following LINQ insertion  Reviewed  potential familial implications

## 2019-06-18 NOTE — Social Work (Signed)
CSW was unable to complete sbirt due to pt not being in his room. CSW will attempt at more appropriate time.   Emeterio Reeve, Latanya Presser, [place] Social Worker [phone]

## 2019-06-18 NOTE — Transfer of Care (Signed)
Immediate Anesthesia Transfer of Care Note  Patient: Alec Maldonado  Procedure(s) Performed: TRANSESOPHAGEAL ECHOCARDIOGRAM (TEE) (N/A ) BUBBLE STUDY  Patient Location: Endoscopy Unit  Anesthesia Type:MAC  Level of Consciousness: awake, alert  and oriented  Airway & Oxygen Therapy: Patient Spontanous Breathing and Patient connected to nasal cannula oxygen  Post-op Assessment: Report given to RN and Post -op Vital signs reviewed and stable  Post vital signs: Reviewed and stable  Last Vitals:  Vitals Value Taken Time  BP 114/57 06/18/19 1301  Temp    Pulse 74 06/18/19 1302  Resp 22 06/18/19 1302  SpO2 98 % 06/18/19 1302  Vitals shown include unvalidated device data.  Last Pain:  Vitals:   06/18/19 1206  TempSrc: Oral  PainSc: 0-No pain         Complications: No apparent anesthesia complications

## 2019-06-18 NOTE — Discharge Instructions (Signed)
Wound care instructions Keep incision clean and dry for 3 days. You can remove outer dressing tomorrow. Leave steri-strips (little pieces of tape) on until seen in the office for wound check appointment. Call the office ([phone]) for redness, drainage, swelling, or fever.  

## 2019-06-18 NOTE — CV Procedure (Addendum)
    PROCEDURE NOTE:  Procedure:  Transesophageal echocardiogram Operator:  Fransico Him, MD Indications:  CVA Complications: None  During this procedure the patient is administered a total of Propofol 204 mg and to achieve and maintain moderate conscious sedation.  The patient's heart rate, blood pressure, and oxygen saturation are monitored continuously during the procedure by anesthesia.   Results: Low normal  LV size and function Normal RV size and function Normal RA Severely dilated LA with no thrombus in LA or LAA Normal TV with mild TR Normal PV with mild PR S/P MV repair. The MV is degenerative with thickened and calcified leaflets.  There is some restricted leaflet motion but no mitral stenosis with mean MVG of 51mmHg. There is mild mitral regurgitation.  Trileaflet AV with mild aortic valve sclerosis with no stenosis and mild aortic insufficiency.   Lipomatous  interatrial septum with no evidence of shunt by colorflow dopper of agitated saline contrast injection.  Normal thoracic and ascending aorta.  No source of embolism. Transgastric views not adequate for remeasurement of posterior LV wall.  Repeat 2D images of LV wall thickness and MV gradients were performed post TEE - refer to finalized TEE report in Epic.  The patient tolerated the procedure well and was transferred back to their room in stable condition.  Signed: Fransico Him, MD [facility]

## 2019-06-18 NOTE — Progress Notes (Signed)
Echocardiogram Echocardiogram Transesophageal has been performed.  Oneal Deputy Jaiven Graveline 06/18/2019, 1:10 PM

## 2019-06-18 NOTE — Progress Notes (Signed)
STROKE TEAM PROGRESS NOTE   INTERVAL HISTORY His wife and daughter are at the bedside.  He continues to have no vision in his  left eye.TEE and loop scheduled for later today Vitals:   06/17/19 2100 06/18/19 0054 06/18/19 0354 06/18/19 0853  BP:  108/68 125/74 133/87  Pulse:  64 68 67  Resp:  18 18 18   Temp:  97.8 F (36.6 C) (!) 97.4 F (36.3 C) 98 F (36.7 C)  TempSrc:  Oral Oral Oral  SpO2: 96% 95% 95% 94%  Weight:      Height:        CBC:  Recent Labs  Lab 06/16/19 1152 06/18/19 0424  WBC 8.1 7.4  NEUTROABS 5.5 4.5  HGB 13.4 12.2*  HCT 42.5 38.1*  MCV 85.0 84.3  PLT 262 [id number]    Basic Metabolic Panel:  Recent Labs  Lab 06/16/19 1152 06/18/19 0424  NA 141 140  K 4.1 3.7  CL 108 105  CO2 23 23  GLUCOSE 97 97  BUN 12 14  CREATININE 1.04 1.14  CALCIUM 9.2 8.7*  MG  --  2.0   Lipid Panel:     Component Value Date/Time   CHOL 125 06/17/2019 0409   TRIG 194 (H) 06/17/2019 0409   HDL 26 (L) 06/17/2019 0409   CHOLHDL 4.8 06/17/2019 0409   VLDL 39 06/17/2019 0409   LDLCALC 60 06/17/2019 0409   HgbA1c:  Lab Results  Component Value Date   HGBA1C 5.7 (H) 06/17/2019   Urine Drug Screen: No results found for: LABOPIA, COCAINSCRNUR, LABBENZ, AMPHETMU, THCU, LABBARB  Alcohol Level No results found for: ETH  IMAGING past 24 hours No results found.  PHYSICAL EXAM Pleasant elderly Caucasian male not in distress. . Afebrile. Head is nontraumatic. Neck is supple without bruit.    Cardiac exam no murmur or gallop. Lungs are clear to auscultation. Distal pulses are well felt. Neurological Exam ;  Awake  Alert oriented x 3. Normal speech and language.eye movements full without nystagmus.fundi were not visualized. 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  Left pupil is larger than right and nonreactive.  Fundi were not visualized.  Visual fields appear normal in right eye.Marland Kitchen Hearing is normal. Palatal  movements are normal. Face symmetric. Tongue midline. Normal strength, tone, reflexes and coordination. Normal sensation. Gait deferred.   ASSESSMENT/PLAN [name] is a 69 y.o. male with history of mitral regurg s/p repair in 2001, HLD, palpitations (SVT), migraines presenting with sudden onset L eye vision loss Fri night at 8p without return of vision, no other neuro sx.   Stroke: Silent B punctate infarcts embolic secondary to unknown source, high risk for atrial fibrillation   MRI  Several scattered punctate infarcts  CT head no infarcts seen. Small vessel disease. Atrophy. Sinus dz.  CTA head Unremarkable   CTA neck atherosclerosis   2D Echo EF 50-55%. No source of embolus   TEE to look for embolic source. Arranged with [place] for tomorrow.  If positive for PFO (patent foramen ovale), check bilateral lower extremity venous dopplers to rule out DVT as possible source of stroke. (I have made patient NPO after midnight tonight).  If TEE negative, a [place] electrophysiologist will consult and consider placement of an implantable loop recorder to evaluate for atrial fibrillation as etiology of stroke. This has been explained to patient/family by Dr. Leonie Man  and they are agreeable.   LDL 60  HgbA1c 5.7  Lovenox 40 mg sq daily for VTE prophylaxis  aspirin 81 mg daily prior to admission, now on aspirin 81 mg daily and clopidogrel 75 mg daily. Continue DAPT x 3 weeks then plavix alone    Therapy recommendations:  No PT  Disposition:  Return home  Hypertension  Stable . Permissive hypertension (OK if < 220/120) but gradually normalize in 5-7 days . Long-term BP goal normotensive  Hyperlipidemia  Home meds:  zocor 80, zetia 10, fish oil   LDL 60, goal < 70  Continue statin and other cholesterol meds at discharge  Other Stroke Risk Factors  Advanced age  Obesity, Body mass index is 32.28 kg/m., recommend  weight loss, diet and exercise as appropriate   Migraines  Hx SVT  Hx MV repair 2001  Other Active Problems  BPH on porscar and flomax  Hospital day # 2 He presented with painless left eye vision loss from central retinal artery occlusion MRI scan shows bilateral small embolic infarcts in the brain without corresponding clinical deficits or symptoms.  Strong suspicion for paroxysmal A. fib or cardiac source of embolism.  Continue ongoing stroke work-up check transesophageal echocardiogram and loop recorder for paroxysmal A. fib.  Dual antiplatelet therapy for 3 weeks followed by aspirin alone.  Long discussion patient and wife, daughter and answered questions.  Discussed with Dr. Bonner Puna  Greater than 50% time during the 25-minute visit was spent on counseling and coordination of care about his embolic stroke and discussion with care team and answering questions. Antony Contras, MD To contact Stroke Continuity provider, please refer to [email or web address] After hours, contact General Neurology

## 2019-06-18 NOTE — Discharge Summary (Signed)
Physician Discharge Summary  Alec Maldonado [id number] DOB: 09/07/1950 DOA: 06/16/2019  PCP: Alec Bush, MD  Admit date: 06/16/2019 Discharge date: 06/18/2019  Admitted From: Home Disposition: Home   Recommendations for Outpatient Follow-up:  1. Follow up with PCP in 1-2 weeks 2. Follow up with cardiology for loop recorder management, asymmetric hypertrophic heart seen on echocardiography.  3. Follow up with neurology in 6-8 weeks. Continuing DAPT x3 weeks, then aspirin alone, continuing simvastatin as LDL is 60.  Home Health: None Equipment/Devices: None Discharge Condition: Stable CODE STATUS: Full Diet recommendation: Heart healthy  Brief/Interim Summary: Alec Tsou Riceis a 69 y.o.malewith medical history significant ofmitral valve repair, aortic insufficiency, hyperlipidemia, BPH presents with complaints of vision loss out of his left eye which started approximately 3 days ago. Patient reports that he can only small amount of light on the far left side on the left eye, but nothing along the center.He followed up with his eye doctor the next day, and was told to return today. He underwent fluorescein angiography which revealed central retinal artery occlusion on the left,and was advised to come to the hospital for further evaluation. He has a prior history ofmitral valve repair over 20 years ago and still intermittently has palpitations. Denies any history of being diagnosed with atrial fibrillation and is not on any oral anticoagulant.Denies having any significant headache, focal weakness, change in speech, chest pain, nausea, vomiting, or diarrhea symptoms.  He was admitted for CRAO, multiple embolic infarcts, underwent workup per neurology recommendations, ultimately having TEE and loop recorder implantation on the day of discharge 4/14.   Discharge Diagnoses:  Principal Problem:   Central retinal artery occlusion, left Active Problems:   Dyslipidemia   Status  post mitral valve repair   Cerebrovascular accident (CVA) due to embolic occlusion of cerebellar artery ([place])   Retinal artery occlusion, central, left   Obesity  Discharge Instructions Discharge Instructions    Diet - low sodium heart healthy   Complete by: As directed    Discharge instructions   Complete by: As directed    You were admitted for a stroke causing blindness and were also noted to have small remote strokes which did not cause deficits. Your work up did not conclusively show any evidence of heart arrhythmia or any clots in the heart. You are stable for discharge, will need to follow up with cardiology and neurology. - Continue taking simvastatin as your LDL cholesterol is at goal of 60. - Take plavix and aspirin for 3 weeks, then switch to aspirin alone. - If your symptoms return or worsen seek medical attention right away.   Increase activity slowly   Complete by: As directed      Allergies as of 06/18/2019   No Known Allergies     Medication List    TAKE these medications   aspirin 81 MG tablet Take 81 mg by mouth daily.   butalbital-acetaminophen-caffeine 50-325-40 MG tablet Commonly known as: FIORICET Take 1 tablet by mouth every 6 (six) hours as needed. What changed: reasons to take this   carvedilol 6.25 MG tablet Commonly known as: COREG Take 1 tablet (6.25 mg total) by mouth 2 (two) times daily with a meal.   clopidogrel 75 MG tablet Commonly known as: PLAVIX Take 1 tablet (75 mg total) by mouth daily. Start taking on: June 19, 2019   ezetimibe 10 MG tablet Commonly known as: ZETIA TAKE 1 TABLET(10 MG) BY MOUTH DAILY What changed:   how much to take  how to take this  when to take this   finasteride 5 MG tablet Commonly known as: PROSCAR Take 1 tablet (5 mg total) by mouth daily.   Fish Oil 1200 MG Caps Take 1,200 mg by mouth daily. Reported on 02/23/2015   ibuprofen 200 MG tablet Commonly known as: ADVIL Take 400 mg by mouth every  6 (six) hours as needed for moderate pain.   simvastatin 80 MG tablet Commonly known as: ZOCOR TAKE 1 TABLET(80 MG) BY MOUTH DAILY What changed:   how much to take  how to take this  when to take this   tamsulosin 0.4 MG Caps capsule Commonly known as: FLOMAX Take 1 capsule (0.4 mg total) by mouth at bedtime.   Voltaren 1 % Gel Generic drug: diclofenac Sodium Apply 2 g topically as needed (knee pain).      Follow-up Information    [facility].   Specialty: Cardiology Why: 06/26/2019 @ 4:30PM, wound check visit Contact information: [address], [place]       Alec Dresser, MD Follow up.   Specialty: Cardiology Why: 07/02/2019 @ 1:40PM, establish cardiac care Contact information: [address] [place] [address] [phone]          No Known Allergies  Consultations:  Neurology  Procedures/Studies: CT ANGIO HEAD W OR WO CONTRAST  Result Date: 06/16/2019 CLINICAL DATA:  Stroke, follow-up. EXAM: CT ANGIOGRAPHY HEAD AND NECK TECHNIQUE: Multidetector CT imaging of the head and neck was performed using the standard protocol during bolus administration of intravenous contrast. Multiplanar CT image reconstructions and MIPs were obtained to evaluate the vascular anatomy. Carotid stenosis measurements (when applicable) are obtained utilizing NASCET criteria, using the distal internal carotid diameter as the denominator. CONTRAST:  83mL OMNIPAQUE IOHEXOL 350 MG/ML SOLN COMPARISON:  Brain MRI 06/16/2019 FINDINGS: CT HEAD FINDINGS Brain: Punctate acute infarcts within the left postcentral gyrus, right caudate, lateral right occipital lobe, parasagittal left occipital lobe and left lateral pons were better appreciated on brain MRI performed earlier the same day 06/16/2019. No interval acute infarct is identified. No evidence of acute intracranial hemorrhage. No  evidence of intracranial mass. No midline shift or extra-axial fluid collection. Mild scattered hypoattenuation within the cerebral white matter is nonspecific, but consistent with chronic small vessel ischemic disease. Mild generalized parenchymal atrophy. Vascular: Reported below. Skull: Normal. Negative for fracture or focal lesion. Sinuses: Extensive partial opacification of the right sphenoid sinus. Mild right maxillary sinus mucosal thickening. No significant mastoid effusion at the imaged levels. Orbits: No acute abnormality. Review of the MIP images confirms the above findings CTA NECK FINDINGS Aortic arch: Common origin of the innominate and left common carotid arteries. Scattered atherosclerotic plaque within the visualized aortic arch and proximal major branch vessels of the neck. No significant innominate or proximal subclavian artery stenosis. Right carotid system: CCA and ICA patent within the neck without measurable stenosis. Mild mixed plaque within the carotid bifurcation and proximal right ECA. Left carotid system: CCA and ICA patent within the neck without measurable stenosis. Mild mixed plaque within the carotid bifurcation. Vertebral arteries: The vertebral arteries are patent within the neck bilaterally without significant stenosis. The right vertebral artery is dominant. Skeleton: No acute bony abnormality or aggressive appearing osseous lesion. Cervical spondylosis with multilevel disc space narrowing, posterior disc osteophytes, uncovertebral and facet hypertrophy. Other neck: Status post left hemithyroidectomy. No pathologically enlarged cervical chain lymph nodes. Upper chest: No consolidation  within the imaged lung apices. Review of the MIP images confirms the above findings CTA HEAD FINDINGS Anterior circulation: The intracranial internal carotid arteries are patent without significant stenosis. The M1 middle cerebral arteries are patent without significant stenosis. No M2 proximal  branch occlusion or high-grade proximal stenosis is identified. Slightly hypoplastic A1 left ACA. The anterior cerebral arteries are patent without high-grade proximal stenosis. No intracranial aneurysm is identified. Posterior circulation: The non dominant intracranial left vertebral artery is developmentally diminutive, but patent. The dominant intracranial right vertebral artery is patent without significant stenosis, as is the basilar artery. The posterior cerebral arteries are patent proximally without significant stenosis. Posterior communicating arteries are poorly delineated and may be hypoplastic or absent bilaterally. Venous sinuses: Within limitations of contrast timing, no convincing thrombus. Anatomic variants: As described. Review of the MIP images confirms the above findings IMPRESSION: CT head: 1. Multiple punctate supratentorial and infratentorial acute infarcts were better appreciated on brain MRI performed earlier the same day. No CT evidence of interval acute infarct. 2. Background mild generalized parenchymal atrophy and chronic small vessel ischemic disease. 3. Right sphenoid sinusitis. CTA neck: The bilateral common carotid, internal carotid and vertebral arteries are patent within the neck without significant stenosis. Mild atherosclerotic disease within the visualized aortic arch and carotid systems as described. CTA head No intracranial large vessel occlusion or proximal high-grade arterial stenosis. Electronically Signed   By: Kellie Simmering DO   On: 06/16/2019 18:11   CT ANGIO NECK W OR WO CONTRAST  Result Date: 06/16/2019 CLINICAL DATA:  Stroke, follow-up. EXAM: CT ANGIOGRAPHY HEAD AND NECK TECHNIQUE: Multidetector CT imaging of the head and neck was performed using the standard protocol during bolus administration of intravenous contrast. Multiplanar CT image reconstructions and MIPs were obtained to evaluate the vascular anatomy. Carotid stenosis measurements (when applicable) are  obtained utilizing NASCET criteria, using the distal internal carotid diameter as the denominator. CONTRAST:  36mL OMNIPAQUE IOHEXOL 350 MG/ML SOLN COMPARISON:  Brain MRI 06/16/2019 FINDINGS: CT HEAD FINDINGS Brain: Punctate acute infarcts within the left postcentral gyrus, right caudate, lateral right occipital lobe, parasagittal left occipital lobe and left lateral pons were better appreciated on brain MRI performed earlier the same day 06/16/2019. No interval acute infarct is identified. No evidence of acute intracranial hemorrhage. No evidence of intracranial mass. No midline shift or extra-axial fluid collection. Mild scattered hypoattenuation within the cerebral white matter is nonspecific, but consistent with chronic small vessel ischemic disease. Mild generalized parenchymal atrophy. Vascular: Reported below. Skull: Normal. Negative for fracture or focal lesion. Sinuses: Extensive partial opacification of the right sphenoid sinus. Mild right maxillary sinus mucosal thickening. No significant mastoid effusion at the imaged levels. Orbits: No acute abnormality. Review of the MIP images confirms the above findings CTA NECK FINDINGS Aortic arch: Common origin of the innominate and left common carotid arteries. Scattered atherosclerotic plaque within the visualized aortic arch and proximal major branch vessels of the neck. No significant innominate or proximal subclavian artery stenosis. Right carotid system: CCA and ICA patent within the neck without measurable stenosis. Mild mixed plaque within the carotid bifurcation and proximal right ECA. Left carotid system: CCA and ICA patent within the neck without measurable stenosis. Mild mixed plaque within the carotid bifurcation. Vertebral arteries: The vertebral arteries are patent within the neck bilaterally without significant stenosis. The right vertebral artery is dominant. Skeleton: No acute bony abnormality or aggressive appearing osseous lesion. Cervical  spondylosis with multilevel disc space narrowing, posterior disc osteophytes, uncovertebral and facet  hypertrophy. Other neck: Status post left hemithyroidectomy. No pathologically enlarged cervical chain lymph nodes. Upper chest: No consolidation within the imaged lung apices. Review of the MIP images confirms the above findings CTA HEAD FINDINGS Anterior circulation: The intracranial internal carotid arteries are patent without significant stenosis. The M1 middle cerebral arteries are patent without significant stenosis. No M2 proximal branch occlusion or high-grade proximal stenosis is identified. Slightly hypoplastic A1 left ACA. The anterior cerebral arteries are patent without high-grade proximal stenosis. No intracranial aneurysm is identified. Posterior circulation: The non dominant intracranial left vertebral artery is developmentally diminutive, but patent. The dominant intracranial right vertebral artery is patent without significant stenosis, as is the basilar artery. The posterior cerebral arteries are patent proximally without significant stenosis. Posterior communicating arteries are poorly delineated and may be hypoplastic or absent bilaterally. Venous sinuses: Within limitations of contrast timing, no convincing thrombus. Anatomic variants: As described. Review of the MIP images confirms the above findings IMPRESSION: CT head: 1. Multiple punctate supratentorial and infratentorial acute infarcts were better appreciated on brain MRI performed earlier the same day. No CT evidence of interval acute infarct. 2. Background mild generalized parenchymal atrophy and chronic small vessel ischemic disease. 3. Right sphenoid sinusitis. CTA neck: The bilateral common carotid, internal carotid and vertebral arteries are patent within the neck without significant stenosis. Mild atherosclerotic disease within the visualized aortic arch and carotid systems as described. CTA head No intracranial large vessel  occlusion or proximal high-grade arterial stenosis. Electronically Signed   By: Kellie Simmering DO   On: 06/16/2019 18:11   MR Brain Wo Contrast (neuro protocol)  Result Date: 06/16/2019 CLINICAL DATA:  Left vision loss EXAM: MRI HEAD WITHOUT CONTRAST TECHNIQUE: Multiplanar, multiecho pulse sequences of the brain and surrounding structures were obtained without intravenous contrast. COMPARISON:  None. FINDINGS: Brain: Punctate foci of reduced diffusion are identified within the left postcentral gyrus, right caudate, lateral right occipital lobe, parasagittal left occipital lobe, and left lateral pons. Punctate focus of susceptibility in the right posterior corona radiata is compatible with chronic microhemorrhage or mineralization. Few scattered small foci of T2 hyperintensity in the supratentorial white matter are nonspecific but may reflect mild chronic microvascular ischemic changes. Question of atrophy of the right optic tract just posterior to the chiasm. Ventricles and sulci are normal in size and configuration. There is no intracranial mass or mass effect. There is no hydrocephalus or extra-axial fluid collection. Vascular: Major vessel flow voids at the skull base are preserved. Skull and upper cervical spine: Normal marrow signal is preserved. Sinuses/Orbits: Mild circumferential sphenoid mucosal thickening. Minor maxillary and ethmoid mucosal thickening. Bilateral lens replacements. Other: Sella is unremarkable.  Mastoid air cells are clear. IMPRESSION: Several punctate acute infarcts likely secondary to emboli from central source. Question of atrophy of the right optic tract just posterior to the chiasm. This is not well evaluated and may be artifactual. Electronically Signed   By: Macy Mis M.D.   On: 06/16/2019 14:39   ECHOCARDIOGRAM COMPLETE  Result Date: 06/16/2019    ECHOCARDIOGRAM REPORT   Patient Name:   Alec Maldonado Harrel Date of Exam: 06/16/2019 Medical Rec #:  [id number]    Height:       70.0  in Accession #:    [id number]   Weight:       225.0 lb Date of Birth:  Aug 15, 1950    BSA:          2.194 m Patient Age:    69 years  BP:           125/80 mmHg Patient Gender: M            HR:           68 bpm. Exam Location:  Inpatient Procedure: 2D Echo, Cardiac Doppler and Color Doppler Indications:    Stroke 434.91 / I163.9  History:        Patient has prior history of Echocardiogram examinations, most                 recent 04/01/2012. Arrythmias:RBBB, Signs/Symptoms:Murmur and                 Dyspnea; Risk Factors:Dyslipidemia. Mitral Valve Repair. Thyroid                 Disease.  Sonographer:    Tiffany Dance Referring Phys: [id number] RONDELL A SMITH IMPRESSIONS  1. Left ventricular ejection fraction, by estimation, is 50 to 55%. The left ventricle has low normal function. The left ventricle has no regional wall motion abnormalities. The left ventricular internal cavity size was mildly dilated. There is severe asymmetric left ventricular hypertrophy. Left ventricular diastolic parameters are consistent with Grade I diastolic dysfunction (impaired relaxation).  2. Right ventricular systolic function is normal. The right ventricular size is normal. There is normal pulmonary artery systolic pressure.  3. Left atrial size was severely dilated.  4. The mitral valve is degenerative. Trivial mitral valve regurgitation. No evidence of mitral stenosis.  5. The aortic valve is tricuspid. Aortic valve regurgitation is trivial. No aortic stenosis is present.  6. Aortic dilatation noted. There is mild dilatation of the ascending aorta.  7. The inferior vena cava is normal in size with greater than 50% respiratory variability, suggesting right atrial pressure of 3 mmHg. FINDINGS  Left Ventricle: Severe posterior hypertrophy with otherwise mild concentric LVH. Left ventricular ejection fraction, by estimation, is 50 to 55%. The left ventricle has low normal function. The left ventricle has no regional wall motion  abnormalities. The left ventricular internal cavity size was mildly dilated. There is severe asymmetric left ventricular hypertrophy. Left ventricular diastolic parameters are consistent with Grade I diastolic dysfunction (impaired relaxation). Right Ventricle: The right ventricular size is normal. No increase in right ventricular wall thickness. Right ventricular systolic function is normal. There is normal pulmonary artery systolic pressure. The tricuspid regurgitant velocity is 1.31 m/s, and  with an assumed right atrial pressure of 3 mmHg, the estimated right ventricular systolic pressure is 9.9 mmHg. Left Atrium: Left atrial size was severely dilated. Right Atrium: Right atrial size was normal in size. Pericardium: There is no evidence of pericardial effusion. Mitral Valve: The mitral valve is degenerative in appearance. Normal mobility of the mitral valve leaflets. Severe mitral annular calcification. Trivial mitral valve regurgitation. No evidence of mitral valve stenosis. MV peak gradient, 9.1 mmHg. The mean mitral valve gradient is 3.7 mmHg. Tricuspid Valve: The tricuspid valve is normal in structure. Tricuspid valve regurgitation is trivial. No evidence of tricuspid stenosis. Aortic Valve: The aortic valve is tricuspid. Aortic valve regurgitation is trivial. Aortic regurgitation PHT measures 787 msec. No aortic stenosis is present. Pulmonic Valve: The pulmonic valve was normal in structure. Pulmonic valve regurgitation is mild. No evidence of pulmonic stenosis. Aorta: The aortic root is normal in size and structure and aortic dilatation noted. There is mild dilatation of the ascending aorta. Venous: The inferior vena cava is normal in size with greater than 50% respiratory variability, suggesting right atrial pressure  of 3 mmHg. IAS/Shunts: No atrial level shunt detected by color flow Doppler.  LEFT VENTRICLE PLAX 2D LVIDd:         5.50 cm LVIDs:         4.00 cm LV PW:         1.70 cm LV IVS:        1.20  cm LVOT diam:     2.30 cm LV SV:         72 LV SV Index:   33 LVOT Area:     4.15 cm  RIGHT VENTRICLE            IVC RV Basal diam:  2.70 cm    IVC diam: 1.70 cm RV S prime:     8.06 cm/s TAPSE (M-mode): 2.1 cm LEFT ATRIUM             Index       RIGHT ATRIUM           Index LA diam:        5.30 cm 2.42 cm/m  RA Area:     10.00 cm LA Vol (A2C):   86.7 ml 39.51 ml/m RA Volume:   18.20 ml  8.29 ml/m LA Vol (A4C):   96.1 ml 43.79 ml/m LA Biplane Vol: 94.7 ml 43.15 ml/m  AORTIC VALVE LVOT Vmax:   76.00 cm/s LVOT Vmean:  52.200 cm/s LVOT VTI:    0.173 m AI PHT:      787 msec  AORTA Ao Root diam: 4.70 cm Ao Asc diam:  3.70 cm MITRAL VALVE                TRICUSPID VALVE MV Area (PHT): 1.96 cm     TR Peak grad:   6.9 mmHg MV Peak grad:  9.1 mmHg     TR Vmax:        131.00 cm/s MV Mean grad:  3.7 mmHg MV Vmax:       1.51 m/s     SHUNTS MV Vmean:      371.0 cm/s   Systemic VTI:  0.17 m MV VTI:        0.48 m       Systemic Diam: 2.30 cm MV PHT:        113.00 msec MV Decel Time: 387 msec MV E velocity: 94.30 cm/s MV A velocity: 116.00 cm/s MV E/A ratio:  0.81 Skeet Latch MD Electronically signed by Skeet Latch MD Signature Date/Time: 06/16/2019/10:34:09 PM    Final    B-Scan Ultrasound - OS - Left Eye  Result Date: 06/16/2019 Quality was good. Findings included posterior vitreous detachment, vitreous opacities. Notes No retinal detachment evident  Fluorescein Angiography Optos (Transit OS)  Result Date: 06/16/2019 Right Eye Early phase findings include normal observations. Mid/Late phase findings include normal observations. Choroidal neovascularization is not present. Left Eye Early phase findings include blockage, delayed filling. Mid/Late phase findings include blockage, delayed filling. 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 Extensive retinal nonperfusion and high risk for neovascularization OS.  Color Fundus Photography Optos - OU - Both Eyes  Result  Date: 06/16/2019 Right Eye Disc findings include normal observations. Macula : normal observations. Periphery : normal observations. Left Eye Disc findings include pallor. Macula : flat. Vessels : attenuated. Periphery : normal observations. Notes OS, with cherry-red spot due to diffuse macular retinal whitening. Attenuated central retinal artery.  VAS [place] LOWER EXTREMITY VENOUS (DVT)  Result Date:  06/17/2019  Lower Venous DVTStudy Indications: Edema.  Comparison Study: no prior Performing Technologist: Abram Sander RVS  Examination Guidelines: A complete evaluation includes B-mode imaging, spectral Doppler, color Doppler, and power Doppler as needed of all accessible portions of each vessel. Bilateral testing is considered an integral part of a complete examination. Limited examinations for reoccurring indications may be performed as noted. The reflux portion of the exam is performed with the patient in reverse Trendelenburg.  +---------+---------------+---------+-----------+----------+--------------+ RIGHT    CompressibilityPhasicitySpontaneityPropertiesThrombus Aging +---------+---------------+---------+-----------+----------+--------------+ CFV      Full           Yes      Yes                                 +---------+---------------+---------+-----------+----------+--------------+ SFJ      Full                                                        +---------+---------------+---------+-----------+----------+--------------+ FV Prox  Full                                                        +---------+---------------+---------+-----------+----------+--------------+ FV Mid   Full                                                        +---------+---------------+---------+-----------+----------+--------------+ FV DistalFull                                                        +---------+---------------+---------+-----------+----------+--------------+ PFV      Full                                                         +---------+---------------+---------+-----------+----------+--------------+ POP      Full           Yes      Yes                                 +---------+---------------+---------+-----------+----------+--------------+ PTV      Full                                                        +---------+---------------+---------+-----------+----------+--------------+ PERO     Full                                                        +---------+---------------+---------+-----------+----------+--------------+   +---------+---------------+---------+-----------+----------+--------------+  LEFT     CompressibilityPhasicitySpontaneityPropertiesThrombus Aging +---------+---------------+---------+-----------+----------+--------------+ CFV      Full           Yes      Yes                                 +---------+---------------+---------+-----------+----------+--------------+ SFJ      Full                                                        +---------+---------------+---------+-----------+----------+--------------+ FV Prox  Full                                                        +---------+---------------+---------+-----------+----------+--------------+ FV Mid   Full                                                        +---------+---------------+---------+-----------+----------+--------------+ FV DistalFull                                                        +---------+---------------+---------+-----------+----------+--------------+ PFV      Full                                                        +---------+---------------+---------+-----------+----------+--------------+ POP      Full           Yes      Yes                                 +---------+---------------+---------+-----------+----------+--------------+ PTV      Full                                                         +---------+---------------+---------+-----------+----------+--------------+ PERO     Full                                                        +---------+---------------+---------+-----------+----------+--------------+     Summary: BILATERAL: - No evidence of deep vein thrombosis seen in the lower extremities, bilaterally.   *See table(s) above for measurements and observations. Electronically signed by Deitra Mayo MD on 06/17/2019 at 3:20:24 PM.    Final  Cardiology/EP  Subjective: No change in loss of vision, but no focal weakness reported. Wants to go home as soon as possible after loop recorder.  Discharge Exam: Vitals:   06/18/19 1315 06/18/19 1330  BP: 125/69 135/71  Pulse: 65 62  Resp: 18 17  Temp:    SpO2: 99% 100%   General: Pt is alert, awake, not in acute distress Cardiovascular: RRR, S1/S2 +, no rubs, no gallops Respiratory: CTA bilaterally, no wheezing, no rhonchi Abdominal: Soft, NT, ND, bowel sounds + Extremities: No edema, no cyanosis  Labs: BNP (last 3 results) No results for input(s): BNP in the last 8760 hours. Basic Metabolic Panel: Recent Labs  Lab 06/16/19 1152 06/18/19 0424  NA 141 140  K 4.1 3.7  CL 108 105  CO2 23 23  GLUCOSE 97 97  BUN 12 14  CREATININE 1.04 1.14  CALCIUM 9.2 8.7*  MG  --  2.0   Liver Function Tests: Recent Labs  Lab 06/16/19 1152 06/18/19 0424  AST 20 19  ALT 21 18  ALKPHOS 59 57  BILITOT 0.6 0.8  PROT 7.0 6.3*  ALBUMIN 4.1 3.5   No results for input(s): LIPASE, AMYLASE in the last 168 hours. No results for input(s): AMMONIA in the last 168 hours. CBC: Recent Labs  Lab 06/16/19 1152 06/18/19 0424  WBC 8.1 7.4  NEUTROABS 5.5 4.5  HGB 13.4 12.2*  HCT 42.5 38.1*  MCV 85.0 84.3  PLT 262 256   Cardiac Enzymes: No results for input(s): CKTOTAL, CKMB, CKMBINDEX, TROPONINI in the last 168 hours. BNP: Invalid input(s): POCBNP CBG: Recent Labs  Lab 06/16/19 1222  GLUCAP 86    D-Dimer No results for input(s): DDIMER in the last 72 hours. Hgb A1c Recent Labs    06/17/19 0408  HGBA1C 5.7*   Lipid Profile Recent Labs    06/17/19 0409  CHOL 125  HDL 26*  LDLCALC 60  TRIG 194*  CHOLHDL 4.8   Thyroid function studies No results for input(s): TSH, T4TOTAL, T3FREE, THYROIDAB in the last 72 hours.  Invalid input(s): FREET3 Anemia work up No results for input(s): VITAMINB12, FOLATE, FERRITIN, TIBC, IRON, RETICCTPCT in the last 72 hours. Urinalysis    Component Value Date/Time   COLORURINE YELLOW 03/11/2013 1617   APPEARANCEUR CLOUDY (A) 03/11/2013 1617   LABSPEC 1.018 03/11/2013 1617   PHURINE 6.0 03/11/2013 1617   GLUCOSEU NEGATIVE 03/11/2013 1617   HGBUR NEGATIVE 03/11/2013 1617   BILIRUBINUR negative 03/15/2018 0837   KETONESUR NEGATIVE 03/11/2013 1617   PROTEINUR Negative 03/15/2018 0837   PROTEINUR NEGATIVE 03/11/2013 1617   UROBILINOGEN 0.2 03/15/2018 0837   UROBILINOGEN 0.2 03/11/2013 1617   NITRITE negative 03/15/2018 0837   NITRITE NEGATIVE 03/11/2013 1617   LEUKOCYTESUR Negative 03/15/2018 0837    Microbiology Recent Results (from the past 240 hour(s))  SARS CORONAVIRUS 2 (TAT 6-24 HRS) Nasopharyngeal Nasopharyngeal Swab     Status: None   Collection Time: 06/16/19 12:51 PM   Specimen: Nasopharyngeal Swab  Result Value Ref Range Status   SARS Coronavirus 2 NEGATIVE NEGATIVE Final    Comment: (NOTE) SARS-CoV-2 target nucleic acids are NOT DETECTED. The SARS-CoV-2 RNA is generally detectable in upper and lower respiratory specimens during the acute phase of infection. Negative results do not preclude SARS-CoV-2 infection, do not rule out co-infections with other pathogens, and should not be used as the sole basis for treatment or other patient management decisions. Negative results must be combined with clinical observations, patient history, and epidemiological information. The  expected result is Negative. Fact Sheet for  Patients: SugarRoll.be Fact Sheet for Healthcare Providers: https://www.woods-mathews.com/ This test is not yet approved or cleared by the [place] FDA and  has been authorized for detection and/or diagnosis of SARS-CoV-2 by FDA under an Emergency Use Authorization (EUA). This EUA will remain  in effect (meaning this test can be used) for the duration of the COVID-19 declaration under Section 56 4(b)(1) of the Act, 21 U.S.C. section 360bbb-3(b)(1), unless the authorization is terminated or revoked sooner. Performed at [facility], [place] [address], [place], [place] [address]     Time coordinating discharge: Approximately 40 minutes  Patrecia Pour, MD  [facility] 06/18/2019, 3:39 PM

## 2019-06-18 NOTE — Progress Notes (Signed)
Occupational Therapy Evaluation Only Patient Details Name: Alec Maldonado MRN: [id number] DOB: [birth year] Today's Date: 06/18/2019    History of Present Illness 69 yo male admitted to ED on 4/12 with L eye central retinal artery occlusion and vision loss. MRI reveals several punctate emboli, specifically at R posterior corona radiata. PMH includes HTN, MVR, BCC, angina, HLD, RBBB.   Clinical Impression   PTA pt resided with wife, independent in all ADL, IADL, and mobility tasks. Pt does not ambulate with an assistive device and reports 0 falls in the last 6 months. Pt still drives. Pt able to ambulate to/from bathroom, complete toileting task, walk-in shower transfer, and hygiene at the sink independently. Noted 0 instances of LOB throughout. Educated/instructed pt on safety strategies and visual scanning techniques to prevent future falls with good understanding. No further skilled OT services recommended at this time as pt appears to be functioning near baseline for self-care and functional transfer tasks.     Follow Up Recommendations  No OT follow up    Equipment Recommendations  None recommended by OT    Recommendations for Other Services       Precautions / Restrictions Precautions Precautions: Fall Restrictions Weight Bearing Restrictions: No      Mobility Bed Mobility Overal bed mobility: Independent             General bed mobility comments: HOB flat, without use of bedrails  Transfers Overall transfer level: Independent Equipment used: None             General transfer comment: No instances of LOB    Balance Overall balance assessment: Independent   Sitting balance-Leahy Scale: Normal       Standing balance-Leahy Scale: Normal                             ADL either performed or assessed with clinical judgement   ADL Overall ADL's : Independent                                       General ADL Comments: Pt able to  ambulate to/from bathroom, complete toileting tasks, walk-in shower transfer, and hygiene at the sink. Noted 0 instances of LOB throughout.      Vision Baseline Vision/History: Wears glasses Wears Glasses: Reading only Patient Visual Report: Central vision impairment Vision Assessment?: Yes Eye Alignment: Within Functional Limits Ocular Range of Motion: Other (comment)(Difficulty tracking central vision in left eye) Tracking/Visual Pursuits: Other (comment)(Difficulty tracking in central vision with left eye) Convergence: Impaired (comment)(Impaired on left) Additional Comments: Per neurology report: pt saw an ophthalmologist who dx pt with central retinal artery occlusion     Perception     Praxis      Pertinent Vitals/Pain Pain Assessment: No/denies pain     Hand Dominance Right   Extremity/Trunk Assessment Upper Extremity Assessment Upper Extremity Assessment: Overall WFL for tasks assessed   Lower Extremity Assessment Lower Extremity Assessment: Defer to PT evaluation       Communication Communication Communication: No difficulties   Cognition Arousal/Alertness: Awake/alert Behavior During Therapy: WFL for tasks assessed/performed Overall Cognitive Status: Within Functional Limits for tasks assessed                                 General Comments: A&O x 4.  Pt pleasant and willing to participate in therapy tasks.    General Comments  VSS    Exercises     Shoulder Instructions      Home Living Family/patient expects to be discharged to:: Private residence Living Arrangements: Spouse/significant other Available Help at Discharge: Available 24 hours/day;Family Type of Home: House Home Access: Stairs to enter [employer] of Steps: 5   Home Layout: Laundry or work area in basement;Two level;Able to live on main level with bedroom/bathroom Alternate Level Stairs-Number of Steps: flight Alternate Level Stairs-Rails: Right Bathroom  Shower/Tub: Occupational psychologist: Standard     Home Equipment: Shower seat;Grab bars - tub/shower          Prior Functioning/Environment Level of Independence: Independent        Comments: Pt independent in all ADL, IADL, and mobility tasks. Pt still drives. Pt does not ambulate with an assistive device and reports 0 falls.         OT Problem List:        OT Treatment/Interventions:      OT Goals(Current goals can be found in the care plan section) Acute Rehab OT Goals Patient Stated Goal: to go home  OT Frequency:     Barriers to D/C:            Co-evaluation              AM-PAC OT "6 Clicks" Daily Activity     Outcome Measure Help from another person eating meals?: None Help from another person taking care of personal grooming?: None Help from another person toileting, which includes using toliet, bedpan, or urinal?: None Help from another person bathing (including washing, rinsing, drying)?: None Help from another person to put on and taking off regular upper body clothing?: None Help from another person to put on and taking off regular lower body clothing?: None 6 Click Score: 24   End of Session Nurse Communication: Mobility status  Activity Tolerance: Patient tolerated treatment well Patient left: in bed;with call bell/phone within reach  OT Visit Diagnosis: Other (comment)(Visual impairments in left eye)                Time: [id number] OT Time Calculation (min): 15 min Charges:  OT General Charges $OT Visit: 1 Visit OT Evaluation $OT Eval Low Complexity: 1 Low  Mauri Brooklyn OTR/L [phone]  Mauri Brooklyn 06/18/2019, 8:23 AM

## 2019-06-18 NOTE — Progress Notes (Signed)
IV and tele removed without complication. Pt waiting for [employer] rep to review loop app for cell phone, however rep unavailable. Spoke with Dannial Monarch ([employer] rep) and she states she will call the patient in the morning. Provided patient with her phone number, as well as AVS and discharge instructions. Patient discharged from facility via wheelchair.

## 2019-06-18 NOTE — Anesthesia Procedure Notes (Signed)
Procedure Name: MAC Date/Time: 06/18/2019 12:28 PM Performed by: Alain Marion, CRNA Pre-anesthesia Checklist: Patient identified, Emergency Drugs available, Suction available and Patient being monitored Patient Re-evaluated:Patient Re-evaluated prior to induction Oxygen Delivery Method: Nasal cannula Placement Confirmation: positive ETCO2

## 2019-06-19 ENCOUNTER — Telehealth: Payer: Self-pay

## 2019-06-19 NOTE — Anesthesia Postprocedure Evaluation (Signed)
Anesthesia Post Note  Patient: Alec Maldonado  Procedure(s) Performed: TRANSESOPHAGEAL ECHOCARDIOGRAM (TEE) (N/A ) BUBBLE STUDY     Patient location during evaluation: PACU Anesthesia Type: MAC Level of consciousness: awake and alert Pain management: pain level controlled Vital Signs Assessment: post-procedure vital signs reviewed and stable Respiratory status: spontaneous breathing Cardiovascular status: stable Anesthetic complications: no    Last Vitals:  Vitals:   06/18/19 1330 06/18/19 1638  BP: 135/71 (!) 146/86  Pulse: 62 74  Resp: 17 18  Temp:  36.4 C  SpO2: 100% 97%    Last Pain:  Vitals:   06/18/19 1638  TempSrc: Oral  PainSc:                  Nolon Nations

## 2019-06-19 NOTE — Telephone Encounter (Signed)
Transition Care Management Follow-up Telephone Call  Date of discharge and from where: [facility]18/[facility]  How have you been since you were released from the hospital? Patient states that he is doing "okay" since his discharge from the hospital.   Any questions or concerns? No   Items Reviewed:  Did the pt receive and understand the discharge instructions provided? Yes   Medications obtained and verified? Yes   Any new allergies since your discharge? No   Dietary orders reviewed? Yes  Do you have support at home? Yes   Functional Questionnaire: (I = Independent and D = Dependent) ADLs: I  Bathing/Dressing- I  Meal Prep- I  Eating- I  Maintaining continence- I  Transferring/Ambulation- I  Managing Meds- I  Follow up appointments reviewed:   PCP Hospital f/u appt confirmed? No  Patient declined to make a follow up visit. States he doesn't see the need in it.   [facility] f/u appt confirmed? Yes  Scheduled to see cardiology and urology.  Are transportation arrangements needed? No   If their condition worsens, is the pt aware to call PCP or go to the Emergency Dept.? Yes  Was the patient provided with contact information for the PCP's office or ED? Yes  Was to pt encouraged to call back with questions or concerns? Yes

## 2019-06-19 NOTE — Telephone Encounter (Signed)
Left HIPPA complaint voicemail message to return my call stating we need to complete TCM and schedule follow up visit.

## 2019-06-20 NOTE — Telephone Encounter (Addendum)
Noted.  Hospitalized with vision loss found to have L CRAO underwent neuro eval.

## 2019-06-26 ENCOUNTER — Ambulatory Visit: Payer: Medicare Other

## 2019-06-26 ENCOUNTER — Other Ambulatory Visit: Payer: Self-pay

## 2019-06-26 ENCOUNTER — Telehealth (INDEPENDENT_AMBULATORY_CARE_PROVIDER_SITE_OTHER): Payer: Medicare Other | Admitting: Student

## 2019-06-26 DIAGNOSIS — I63449 Cerebral infarction due to embolism of unspecified cerebellar artery: Secondary | ICD-10-CM

## 2019-06-26 DIAGNOSIS — I429 Cardiomyopathy, unspecified: Secondary | ICD-10-CM

## 2019-06-26 LAB — CUP PACEART INCLINIC DEVICE CHECK
Date Time Interrogation Session: 20210422092746
Implantable Pulse Generator Implant Date: 20210414

## 2019-06-26 NOTE — Progress Notes (Signed)
Virtual ILR wound check. Steri strips removed by patient. Wound with small scab. No drainage or bleeding. Home monitor transmitting nightly. No episodes. Questions answered.  With discrepancy on TTE and TEE, per Dr. Caryl Comes will order cMRI to further clarify LVH due to the possible familial implications if asymmetrical LVH present.

## 2019-06-30 ENCOUNTER — Encounter (INDEPENDENT_AMBULATORY_CARE_PROVIDER_SITE_OTHER): Payer: Self-pay | Admitting: Ophthalmology

## 2019-06-30 ENCOUNTER — Ambulatory Visit (INDEPENDENT_AMBULATORY_CARE_PROVIDER_SITE_OTHER): Payer: Medicare Other | Admitting: Ophthalmology

## 2019-06-30 ENCOUNTER — Encounter (INDEPENDENT_AMBULATORY_CARE_PROVIDER_SITE_OTHER): Payer: Medicare Other | Admitting: Ophthalmology

## 2019-06-30 ENCOUNTER — Other Ambulatory Visit: Payer: Self-pay

## 2019-06-30 DIAGNOSIS — H3412 Central retinal artery occlusion, left eye: Secondary | ICD-10-CM | POA: Diagnosis not present

## 2019-06-30 NOTE — Assessment & Plan Note (Signed)
Work-up has been completed.

## 2019-06-30 NOTE — Progress Notes (Signed)
06/30/2019     CHIEF COMPLAINT Patient presents for Retina Follow Up   HISTORY OF PRESENT ILLNESS: Alec Maldonado is a 69 y.o. male who presents to the clinic today for:  For follow-up of central retinal artery occlusion the left eye.  His work-up has been completed with including MRI scanning which he discloses had multiple mini strokes as well diffusely.  The patient is now on Plavix.    Referring physician: Ria Bush, MD [place],  [place] [address]  HISTORICAL INFORMATION:   Selected notes from the MEDICAL RECORD NUMBER    Lab Results  Component Value Date   HGBA1C 5.7 (H) 06/17/2019     CURRENT MEDICATIONS: No current outpatient medications on file. (Ophthalmic Drugs)   No current facility-administered medications for this visit. (Ophthalmic Drugs)   Current Outpatient Medications (Other)  Medication Sig  . aspirin 81 MG tablet Take 81 mg by mouth daily.  . butalbital-acetaminophen-caffeine (FIORICET, ESGIC) 50-325-40 MG tablet Take 1 tablet by mouth every 6 (six) hours as needed. (Patient taking differently: Take 1 tablet by mouth every 6 (six) hours as needed for headache or migraine. )  . carvedilol (COREG) 6.25 MG tablet Take 1 tablet (6.25 mg total) by mouth 2 (two) times daily with a meal.  . clopidogrel (PLAVIX) 75 MG tablet Take 1 tablet (75 mg total) by mouth daily.  . diclofenac Sodium (VOLTAREN) 1 % GEL Apply 2 g topically as needed (knee pain).   Marland Kitchen ezetimibe (ZETIA) 10 MG tablet TAKE 1 TABLET(10 MG) BY MOUTH DAILY (Patient taking differently: Take 10 mg by mouth daily. TAKE 1 TABLET(10 MG) BY MOUTH DAILY)  . finasteride (PROSCAR) 5 MG tablet Take 1 tablet (5 mg total) by mouth daily.  Marland Kitchen ibuprofen (ADVIL) 200 MG tablet Take 400 mg by mouth every 6 (six) hours as needed for moderate pain.  . Omega-3 Fatty Acids (FISH OIL) 1200 MG CAPS Take 1,200 mg by mouth daily. Reported on 02/23/2015  . simvastatin (ZOCOR) 80 MG tablet TAKE 1  TABLET(80 MG) BY MOUTH DAILY (Patient taking differently: Take 80 mg by mouth daily at 6 PM. TAKE 1 TABLET(80 MG) BY MOUTH DAILY)  . tamsulosin (FLOMAX) 0.4 MG CAPS capsule Take 1 capsule (0.4 mg total) by mouth at bedtime.   No current facility-administered medications for this visit. (Other)      REVIEW OF SYSTEMS:    ALLERGIES No Known Allergies  PAST MEDICAL HISTORY Past Medical History:  Diagnosis Date  . Allergy   . Aortic insufficiency    Mild, echo, January, 2014  . BCC (basal cell carcinoma), back 2017   s/p excisional biospy by derm  . BPH (benign prostatic hypertrophy)   . Chest discomfort    nuclear 06/24/09, suggestion prior small inferior MI with moderate peri-infarvt ischemia versus variable diaphragmatic attenuation  . Colon polyps    colonoscopy 07/02/06, repeat in 10 years  . Dyslipidemia   . Ejection fraction    EF 55%, echo, October, 2010  //   EF 45-50%,  catheterization, 2011  . Heart murmur   . Hyperlipidemia   . Migraines   . Mitral regurgitation    Mitral valve repair 2001  . Neurofibroma of foot 04/2017   s/p removal by derm  . Palpitations 07/2009    helped with carvedilol   . Pleurisy   . Postural dizziness    mild, no orthostatic changes by blood pressure check  . Pre-syncope    January,  2014  . RBBB    First seen January, 2014  . Right ventricular dysfunction    Mild/moderate right ventricular dysfunction by echo, January, 2014, no tricuspid regurgitation, therefore right heart pressure could not be estimated.  . Shortness of breath   . Status post mitral valve repair    2001,  //  echo, October, 2010, good function of the repaired mitral valve  . SVT (supraventricular tachycardia) ([place]) 03/11/2013   Isolated episodes - last 03/2013 after flu treated with adenosine cardioversion in ER.   Marland Kitchen Thyroid disease    Past Surgical History:  Procedure Laterality Date  . aneurysmal dilatation     proximal LAD cath 2001, reoeat catheter 2004, no  significant change  . aneurysmal dilatiation  07/15/09   no change, normal right heart filling pressures and LVEDP- EF 45-50%  . APPENDECTOMY  1970  . BUBBLE STUDY  06/18/2019   Procedure: BUBBLE STUDY;  Surgeon: Sueanne Margarita, MD;  Location: [facility] ENDOSCOPY;  Service: Cardiovascular;;  . CARDIAC CATHETERIZATION  2010   mild global LV dysfunction EF 45-50%, no significant CAD obstruction, aneurysmal dilatation LAD  . COLONOSCOPY  2008  . COLONOSCOPY  06/2016   diverticulosis, rpt 10 yrs Ardis Hughs)  . CORONARY ANGIOPLASTY  09/1999   severe MR o/w normal limits  . CORONARY ANGIOPLASTY  12/19/02   pos. CAD, mild LV dysfunction- med treatment  . EYE SURGERY Left   . HERNIA REPAIR  01/11/05   left, Dr. Hassell Done  . I & D EXTREMITY Right 02/04/2015   Procedure: IRRIGATION AND DEBRIDEMENT RIGHT INDEX FINGER OPEN FRACTURE AND REPAIR DIGITAL NERVE AND ARTERY;  Surgeon: Leanora Cover, MD;  Location: [facility];  Service: Orthopedics;  Laterality: Right;  . LOOP RECORDER INSERTION N/A 06/18/2019   Procedure: LOOP RECORDER INSERTION;  Surgeon: Deboraha Sprang, MD;  Location: [facility] CV LAB;  Service: Cardiovascular;  Laterality: N/A;  . MITRAL VALVE REPAIR  01/22/00   good mitral valve function- echo 12/2008- EF 55% / nuclear 06/2009- EF 36%,? reliable? / EF 45-50%, cath rated 02/2010  . ORIF ELBOW FRACTURE     left  . POLYPECTOMY    . TEE WITHOUT CARDIOVERSION N/A 06/18/2019   Procedure: TRANSESOPHAGEAL ECHOCARDIOGRAM (TEE);  Surgeon: Sueanne Margarita, MD;  Location: [facility] ENDOSCOPY;  Service: Cardiovascular;  Laterality: N/A;  . THYROID SURGERY    . THYROIDECTOMY     left, due to goiter 1994    FAMILY HISTORY Family History  Problem Relation Age of Onset  . Alzheimer's disease Mother   . Heart disease Mother        CHF  . Arthritis Father   . Heart disease Father        leaking mitral valve  . Benign prostatic hyperplasia Father   . Heart disease Brother        murmur  . Hypertension Neg Hx   .  Diabetes Neg Hx   . Depression Neg Hx   . Alcohol abuse Neg Hx   . Drug abuse Neg Hx   . Stroke Neg Hx   . Cancer Neg Hx   . Colon cancer Neg Hx   . Esophageal cancer Neg Hx   . Rectal cancer Neg Hx   . Stomach cancer Neg Hx     SOCIAL HISTORY Social History   Tobacco Use  . Smoking status: Never Smoker  . Smokeless tobacco: Never Used  Substance Use Topics  . Alcohol use: No  . Drug use: No  OPHTHALMIC EXAM:  Base Eye Exam    Visual Acuity (Snellen - Linear)      Right Left   Dist [place] 20/20 -1 LP   Dist ph [place]  NI       Tonometry (Tonopen, 2:17 PM)      Right Left   Pressure 12 11       Pupils      Pupils Dark Light Shape React APD   Right PERRL 4 3 Round Brisk None   Left PERRL 4 3 Round Brisk +1       Visual Fields (Counting fingers)      Left Right     Full   Restrictions Total superior temporal, inferior temporal, superior nasal, inferior nasal deficiencies        Extraocular Movement      Right Left    Full Full       Neuro/Psych    Oriented x3: Yes   Mood/Affect: Normal       Dilation    Left eye: 1.0% Mydriacyl, 2.5% Phenylephrine @ 2:17 PM        Slit Lamp and Fundus Exam    External Exam      Right Left   External Normal Normal       Slit Lamp Exam      Right Left   Lids/Lashes Normal Normal   Conjunctiva/Sclera White and quiet White and quiet   Cornea Clear Clear   Anterior Chamber Deep and quiet Deep and quiet   Iris Round and reactive Round and reactive, sluggish   Lens Posterior chamber intraocular lens Posterior chamber intraocular lens   Anterior Vitreous Normal Normal       Fundus Exam      Right Left   Posterior Vitreous  Central vitreous floaters   Disc  Peripapillary atrophy   C/D Ratio  0.4   Macula  cherry red spot, with retinal whitening in macula   Vessels  Arteriolar narrowing,, attenuated particularly in the macula.   Periphery  Normal          IMAGING AND PROCEDURES  Imaging and  Procedures for 06/30/19           ASSESSMENT/PLAN:  No problem-specific Assessment & Plan notes found for this encounter.      ICD-10-CM   1. Central retinal artery occlusion of left eye  H34.12 Color Fundus Photography Optos - OU - Both Eyes    1.  2.  3.  Ophthalmic Meds Ordered this visit:  No orders of the defined types were placed in this encounter.      No follow-ups on file.  There are no Patient Instructions on file for this visit.   Explained the diagnoses, plan, and follow up with the patient and they expressed understanding.  Patient expressed understanding of the importance of proper follow up care.   Clent Demark Kyliyah Stirn M.D. Diseases & Surgery of the Retina and Vitreous Retina & Diabetic [place] 06/30/19     Abbreviations: M myopia (nearsighted); A astigmatism; H hyperopia (farsighted); P presbyopia; Mrx spectacle prescription;  CTL contact lenses; OD right eye; OS left eye; OU both eyes  XT exotropia; ET esotropia; PEK punctate epithelial keratitis; PEE punctate epithelial erosions; DES dry eye syndrome; MGD meibomian gland dysfunction; ATs artificial tears; PFAT's preservative free artificial tears; [place] nuclear sclerotic cataract; PSC posterior subcapsular cataract; ERM epi-retinal membrane; PVD posterior vitreous detachment; RD retinal detachment; DM diabetes mellitus; DR diabetic retinopathy; NPDR non-proliferative diabetic retinopathy; PDR  proliferative diabetic retinopathy; CSME clinically significant macular edema; DME diabetic macular edema; dbh dot blot hemorrhages; CWS cotton wool spot; POAG primary open angle glaucoma; C/D cup-to-disc ratio; HVF humphrey visual field; GVF goldmann visual field; OCT optical coherence tomography; IOP intraocular pressure; BRVO Branch retinal vein occlusion; CRVO central retinal vein occlusion; CRAO central retinal artery occlusion; BRAO branch retinal artery occlusion; RT retinal tear; SB scleral buckle; PPV pars plana  vitrectomy; VH Vitreous hemorrhage; PRP panretinal laser photocoagulation; IVK intravitreal kenalog; VMT vitreomacular traction; MH Macular hole;  NVD neovascularization of the disc; NVE neovascularization elsewhere; AREDS age related eye disease study; ARMD age related macular degeneration; POAG primary open angle glaucoma; EBMD epithelial/anterior basement membrane dystrophy; ACIOL anterior chamber intraocular lens; IOL intraocular lens; PCIOL posterior chamber intraocular lens; Phaco/IOL phacoemulsification with intraocular lens placement; [place] photorefractive keratectomy; LASIK laser assisted in situ keratomileusis; HTN hypertension; DM diabetes mellitus; COPD chronic obstructive pulmonary disease

## 2019-07-01 ENCOUNTER — Encounter: Payer: Self-pay | Admitting: Cardiology

## 2019-07-01 ENCOUNTER — Telehealth: Payer: Self-pay | Admitting: Cardiology

## 2019-07-01 NOTE — Telephone Encounter (Signed)
Spoke with patient regarding appointment for Cardiac MRI scheduled Monday 08/11/19 at 8:00 am at [facility]----arrival time is 7:15 am 1st floor admissions office.  Will mail information to patient and it is also in My Chart

## 2019-07-02 ENCOUNTER — Encounter: Payer: Self-pay | Admitting: Cardiology

## 2019-07-02 ENCOUNTER — Ambulatory Visit: Payer: Medicare Other | Admitting: Cardiology

## 2019-07-02 ENCOUNTER — Other Ambulatory Visit: Payer: Self-pay

## 2019-07-02 VITALS — BP 106/74 | HR 81 | Temp 97.3°F | Ht 70.0 in | Wt 233.0 lb

## 2019-07-02 DIAGNOSIS — Z7189 Other specified counseling: Secondary | ICD-10-CM

## 2019-07-02 DIAGNOSIS — I517 Cardiomegaly: Secondary | ICD-10-CM | POA: Diagnosis not present

## 2019-07-02 DIAGNOSIS — I63449 Cerebral infarction due to embolism of unspecified cerebellar artery: Secondary | ICD-10-CM | POA: Diagnosis not present

## 2019-07-02 DIAGNOSIS — I471 Supraventricular tachycardia: Secondary | ICD-10-CM

## 2019-07-02 NOTE — Patient Instructions (Signed)

## 2019-07-02 NOTE — Progress Notes (Addendum)
Cardiology Office Note:    Date:  07/02/2019   ID:  Alec Maldonado, DOB [birth year], MRN [id number]  PCP:  Ria Bush, MD  Cardiologist:  Buford Dresser, MD  EP: Dr. Caryl Comes  Referring MD: Ria Bush, MD   Chief Complaint  Patient presents with  . New Patient (Initial Visit)    History of Present Illness:    Alec Maldonado is a 69 y.o. male with a hx of CVA, prior MV repair, hyperlipidemia, SVT who is seen as a new consult at the request of Ria Bush, MD for the evaluation and management of left ventricular hypertrophy.  He is stressed as he was told he will not regain vision after his retinal occlusion. Silent embolic infarct, had TEE.  Has had 3 episodes of SVT, once stopped on its own, once with adenosine, and once with vagal maneuvers. Has never been told he has afib.  Has cardiac MRI scheduled for 08/11/19. Ok with loop recorder per [facility] Standard. Plan to evaluate asymmetric LVH.  Denies chest pain, shortness of breath at rest or with normal exertion. No PND, orthopnea, LE edema or unexpected weight gain. No syncope.  Was very active prior to hospitalization, splitting wood, etc. Since discharge, has used a riding mower, done some yard work.   No major issues with medications. Sometimes gets cramps with simvastatin, discussed switching, but will stay on for now per preference. Has bruised more easily since starting the plavix. No melena/hematochezia/hematuria.  Past Medical History:  Diagnosis Date  . Allergy   . Aortic insufficiency    Mild, echo, January, 2014  . BCC (basal cell carcinoma), back 2017   s/p excisional biospy by derm  . BPH (benign prostatic hypertrophy)   . Chest discomfort    nuclear 06/24/09, suggestion prior small inferior MI with moderate peri-infarvt ischemia versus variable diaphragmatic attenuation  . Colon polyps    colonoscopy 07/02/06, repeat in 10 years  . Dyslipidemia   . Ejection fraction    EF 55%, echo, October, 2010   //   EF 45-50%,  catheterization, 2011  . Heart murmur   . Hyperlipidemia   . Migraines   . Mitral regurgitation    Mitral valve repair 2001  . Neurofibroma of foot 04/2017   s/p removal by derm  . Palpitations 07/2009    helped with carvedilol   . Pleurisy   . Postural dizziness    mild, no orthostatic changes by blood pressure check  . Pre-syncope    January, 2014  . RBBB    First seen January, 2014  . Right ventricular dysfunction    Mild/moderate right ventricular dysfunction by echo, January, 2014, no tricuspid regurgitation, therefore right heart pressure could not be estimated.  . Shortness of breath   . Status post mitral valve repair    2001,  //  echo, October, 2010, good function of the repaired mitral valve  . SVT (supraventricular tachycardia) ([place]) 03/11/2013   Isolated episodes - last 03/2013 after flu treated with adenosine cardioversion in ER.   Marland Kitchen Thyroid disease     Past Surgical History:  Procedure Laterality Date  . aneurysmal dilatation     proximal LAD cath 2001, reoeat catheter 2004, no significant change  . aneurysmal dilatiation  07/15/09   no change, normal right heart filling pressures and LVEDP- EF 45-50%  . APPENDECTOMY  1970  . BUBBLE STUDY  06/18/2019   Procedure: BUBBLE STUDY;  Surgeon: Sueanne Margarita, MD;  Location: [facility] ENDOSCOPY;  Service: Cardiovascular;;  . CARDIAC CATHETERIZATION  2010   mild global LV dysfunction EF 45-50%, no significant CAD obstruction, aneurysmal dilatation LAD  . COLONOSCOPY  2008  . COLONOSCOPY  06/2016   diverticulosis, rpt 10 yrs Ardis Hughs)  . CORONARY ANGIOPLASTY  09/1999   severe MR o/w normal limits  . CORONARY ANGIOPLASTY  12/19/02   pos. CAD, mild LV dysfunction- med treatment  . EYE SURGERY Left   . HERNIA REPAIR  01/11/05   left, Dr. Hassell Done  . I & D EXTREMITY Right 02/04/2015   Procedure: IRRIGATION AND DEBRIDEMENT RIGHT INDEX FINGER OPEN FRACTURE AND REPAIR DIGITAL NERVE AND ARTERY;  Surgeon: Leanora Cover, MD;   Location: [facility];  Service: Orthopedics;  Laterality: Right;  . LOOP RECORDER INSERTION N/A 06/18/2019   Procedure: LOOP RECORDER INSERTION;  Surgeon: Deboraha Sprang, MD;  Location: [place] [facility] CV LAB;  Service: Cardiovascular;  Laterality: N/A;  . MITRAL VALVE REPAIR  01/22/00   good mitral valve function- echo 12/2008- EF 55% / nuclear 06/2009- EF 36%,? reliable? / EF 45-50%, cath rated 02/2010  . ORIF ELBOW FRACTURE     left  . POLYPECTOMY    . TEE WITHOUT CARDIOVERSION N/A 06/18/2019   Procedure: TRANSESOPHAGEAL ECHOCARDIOGRAM (TEE);  Surgeon: Sueanne Margarita, MD;  Location: [facility] ENDOSCOPY;  Service: Cardiovascular;  Laterality: N/A;  . THYROID SURGERY    . THYROIDECTOMY     left, due to goiter 1994    Current Medications: Current Outpatient Medications on File Prior to Visit  Medication Sig  . aspirin 81 MG tablet Take 81 mg by mouth daily.  . butalbital-acetaminophen-caffeine (FIORICET, ESGIC) 50-325-40 MG tablet Take 1 tablet by mouth every 6 (six) hours as needed. (Patient taking differently: Take 1 tablet by mouth every 6 (six) hours as needed for headache or migraine. )  . carvedilol (COREG) 6.25 MG tablet Take 1 tablet (6.25 mg total) by mouth 2 (two) times daily with a meal.  . clopidogrel (PLAVIX) 75 MG tablet Take 1 tablet (75 mg total) by mouth daily.  . diclofenac Sodium (VOLTAREN) 1 % GEL Apply 2 g topically as needed (knee pain).   Marland Kitchen ezetimibe (ZETIA) 10 MG tablet TAKE 1 TABLET(10 MG) BY MOUTH DAILY (Patient taking differently: Take 10 mg by mouth daily. TAKE 1 TABLET(10 MG) BY MOUTH DAILY)  . finasteride (PROSCAR) 5 MG tablet Take 1 tablet (5 mg total) by mouth daily.  Marland Kitchen ibuprofen (ADVIL) 200 MG tablet Take 400 mg by mouth every 6 (six) hours as needed for moderate pain.  . Omega-3 Fatty Acids (FISH OIL) 1200 MG CAPS Take 1,200 mg by mouth daily. Reported on 02/23/2015  . simvastatin (ZOCOR) 80 MG tablet TAKE 1 TABLET(80 MG) BY MOUTH DAILY (Patient taking differently: Take 80  mg by mouth daily at 6 PM. TAKE 1 TABLET(80 MG) BY MOUTH DAILY)  . tamsulosin (FLOMAX) 0.4 MG CAPS capsule Take 1 capsule (0.4 mg total) by mouth at bedtime.   No current facility-administered medications on file prior to visit.     Allergies:   Patient has no known allergies.   Social History   Tobacco Use  . Smoking status: Never Smoker  . Smokeless tobacco: Never Used  Substance Use Topics  . Alcohol use: No  . Drug use: No    Family History: family history includes Alzheimer's disease in his mother; Arthritis in his father; Benign prostatic hyperplasia in his father; Heart disease in his brother, father, and mother. There is no history of  Hypertension, Diabetes, Depression, Alcohol abuse, Drug abuse, Stroke, Cancer, Colon cancer, Esophageal cancer, Rectal cancer, or Stomach cancer.  ROS:   Please see the history of present illness.  Additional pertinent ROS: Constitutional: Negative for chills, fever, night sweats, unintentional weight loss  HENT: Negative for ear pain and hearing loss.   Eyes: Negative for loss of vision and eye pain.  Respiratory: Negative for cough, sputum, wheezing.   Cardiovascular: See HPI. Gastrointestinal: Negative for abdominal pain, melena, and hematochezia.  Genitourinary: Negative for dysuria and hematuria.  Musculoskeletal: Negative for falls and myalgias.  Skin: Negative for itching and rash.  Neurological: Negative for focal weakness, focal sensory changes and loss of consciousness.  Endo/Heme/Allergies: Does not bruise/bleed easily.     EKGs/Labs/Other Studies Reviewed:    The following studies were reviewed today: Echo 06/16/19 1. Left ventricular ejection fraction, by estimation, is 50 to 55%. The  left ventricle has low normal function. The left ventricle has no regional  wall motion abnormalities. The left ventricular internal cavity size was  mildly dilated. There is severe  asymmetric left ventricular hypertrophy. Left ventricular  diastolic  parameters are consistent with Grade I diastolic dysfunction (impaired  relaxation).  2. Right ventricular systolic function is normal. The right ventricular  size is normal. There is normal pulmonary artery systolic pressure.  3. Left atrial size was severely dilated.  4. The mitral valve is degenerative. Trivial mitral valve regurgitation.  No evidence of mitral stenosis.  5. The aortic valve is tricuspid. Aortic valve regurgitation is trivial.  No aortic stenosis is present.  6. Aortic dilatation noted. There is mild dilatation of the ascending  aorta.  7. The inferior vena cava is normal in size with greater than 50%  respiratory variability, suggesting right atrial pressure of 3 mmHg.   EKG:  EKG is personally reviewed.  The ekg ordered today demonstrates sinus rhythm, RBBB, 2 PVCs  Recent Labs: 05/08/2019: TSH 2.99 06/18/2019: ALT 18; BUN 14; Creatinine, Ser 1.14; Hemoglobin 12.2; Magnesium 2.0; Platelets 256; Potassium 3.7; Sodium 140  Recent Lipid Panel    Component Value Date/Time   CHOL 125 06/17/2019 0409   TRIG 194 (H) 06/17/2019 0409   HDL 26 (L) 06/17/2019 0409   CHOLHDL 4.8 06/17/2019 0409   VLDL 39 06/17/2019 0409   LDLCALC 60 06/17/2019 0409    Physical Exam:    VS:  BP 106/74 (BP Location: Right Arm, Patient Position: Sitting, Cuff Size: Normal)   Pulse 81   Temp (!) 97.3 F (36.3 C)   Ht 5\' 10"  (1.778 m)   Wt 233 lb (105.7 kg)   BMI 33.43 kg/m     Wt Readings from Last 3 Encounters:  07/02/19 233 lb (105.7 kg)  06/18/19 225 lb (102.1 kg)  05/13/19 228 lb (103.4 kg)    GEN: Well nourished, well developed in no acute distress HEENT: Normal, moist mucous membranes NECK: No JVD CARDIAC: regular rhythm, normal S1 and S2, no rubs or gallops. 2/6 systolic murmur. VASCULAR: Radial and DP pulses 2+ bilaterally. No carotid bruits RESPIRATORY:  Clear to auscultation without rales, wheezing or rhonchi  ABDOMEN: Soft, non-tender,  non-distended MUSCULOSKELETAL:  Ambulates independently SKIN: Warm and dry, no edema NEUROLOGIC:  Alert and oriented x 3. No focal neuro deficits noted. PSYCHIATRIC:  Normal affect    ASSESSMENT:    1. Cerebrovascular accident (CVA) due to embolic occlusion of cerebellar artery ([place])   2. LVH (left ventricular hypertrophy)   3. Paroxysmal SVT (supraventricular tachycardia) (H[facility])  4. Cardiac risk counseling   5. Counseling on health promotion and disease prevention    PLAN:    Left ventricular hypertrophy: -pending cMRI for further evaluation -no syncope  Paroxysmal SVT: -followed by Dr. Caryl Comes -LINQ loop recorder in place  -on carvedilol 6.25 mg BID  Cryptogenic stroke: -has had full workup including TEE without clear etiology -LINQ loop recorder placed after stroke, followed by EP -on aspirin 81 mg, clopidogrel 75 mg daily and simvastatin 80 mg daily for secondary prevention  Cardiac risk counseling and prevention recommendations: -recommend heart healthy/Mediterranean diet, with whole grains, fruits, vegetable, fish, lean meats, nuts, and olive oil. Limit salt. -recommend moderate walking, 3-5 times/week for 30-50 minutes each session. Aim for at least 150 minutes.week. Goal should be pace of 3 miles/hours, or walking 1.5 miles in 30 minutes -recommend avoidance of tobacco products. Avoid excess alcohol. -Additional risk factor control:  -Diabetes, type II: A1c is 6.7, on metformin and semaglutide  -Lipids: LDL goal <70, last was 60, continue simvastatin and ezetimibe  -Blood pressure control: well controlled, goal <130/80  -Weight: BMI 33, discussed lifestyle  Plan for follow up: 3 mos then 9 mos with wife (also my patient)  Buford Dresser, MD, PhD [place]  [facility]    Medication Adjustments/Labs and Tests Ordered: Current medicines are reviewed at length with the patient today.  Concerns regarding medicines are outlined above.  Orders Placed This  Encounter  Procedures  . EKG 12-Lead   No orders of the defined types were placed in this encounter.   Patient Instructions  Medication Instructions:  Your Physician recommend you continue on your current medication as directed.    *If you need a refill on your cardiac medications before your next appointment, please call your pharmacy*   Lab Work: None   Testing/Procedures: None   Follow-Up: At [facility], you and your health needs are our priority.  As part of our continuing mission to provide you with exceptional heart care, we have created designated Provider Care Teams.  These Care Teams include your primary Cardiologist (physician) and Advanced Practice Providers (APPs -  Physician Assistants and Nurse Practitioners) who all work together to provide you with the care you need, when you need it.  We recommend signing up for the patient portal called "MyChart".  Sign up information is provided on this After Visit Summary.  MyChart is used to connect with patients for Virtual Visits (Telemedicine).  Patients are able to view lab/test results, encounter notes, upcoming appointments, etc.  Non-urgent messages can be sent to your provider as well.   To learn more about what you can do with MyChart, go to [email or web address].    Your next appointment:   3 month(s)  The format for your next appointment:   In Person  Provider:   Buford Dresser, MD       Signed, Buford Dresser, MD PhD 07/02/2019 3:39 PM    [place]

## 2019-07-14 ENCOUNTER — Telehealth: Payer: Self-pay

## 2019-07-14 NOTE — Telephone Encounter (Signed)
LINQ alert received 07/12/19 at 14:49 for questionable AF episode, total of 4 minutes.  At detection appears regular, plot graph appears irregular. Presenting rhythm today VS 70's.  Please advise for recomendations.

## 2019-07-21 ENCOUNTER — Ambulatory Visit (INDEPENDENT_AMBULATORY_CARE_PROVIDER_SITE_OTHER): Payer: Medicare Other | Admitting: *Deleted

## 2019-07-21 DIAGNOSIS — I63449 Cerebral infarction due to embolism of unspecified cerebellar artery: Secondary | ICD-10-CM | POA: Diagnosis not present

## 2019-07-21 LAB — CUP PACEART REMOTE DEVICE CHECK
Date Time Interrogation Session: 20210515165757
Implantable Pulse Generator Implant Date: 20210414

## 2019-07-22 NOTE — Progress Notes (Signed)
[facility] Summary Report / Loop Recorder 

## 2019-07-24 ENCOUNTER — Encounter: Payer: Self-pay | Admitting: Neurology

## 2019-07-24 ENCOUNTER — Other Ambulatory Visit: Payer: Self-pay

## 2019-07-24 ENCOUNTER — Ambulatory Visit: Payer: Medicare Other | Admitting: Neurology

## 2019-07-24 VITALS — BP 122/84 | HR 84 | Ht 70.0 in | Wt 234.0 lb

## 2019-07-24 DIAGNOSIS — H3412 Central retinal artery occlusion, left eye: Secondary | ICD-10-CM | POA: Diagnosis not present

## 2019-07-24 DIAGNOSIS — I6340 Cerebral infarction due to embolism of unspecified cerebral artery: Secondary | ICD-10-CM

## 2019-07-24 NOTE — Progress Notes (Signed)
[facility] [address] [place]. [place] [address] [phone]       OFFICE FOLLOW-UP NOTE  [name] Date of Birth:  Aug 28, 1950 Medical Record Number:  [id number]   HPI: Alec Maldonado is a 69 year old Caucasian male seen today for initial office visit following hospital consultation for stroke and vision loss in April 2021.  He is accompanied by his wife.  History is obtained from them and review of hospital electronic medical records as well as I personally reviewed imaging films in PACS.  He has a past medical history of hyperlipidemia, palpitations, mitral valve repair he presented to [facility] on 06/16/2019 for sudden onset of vision loss in the left eye few days ago.  He was seen by his eye doctor who did fluorescein angiogram and diagnosed central retinal artery occlusion in the left eye.  He was referred to the [facility] emergency room for evaluation where he underwent an MRI scan which showed several tiny scattered punctate bihemispheric infarcts felt to be of embolic etiology.  CT angiogram of the head and brain showed no significant large vessel stenosis.  2D echo showed normal ejection fraction.  LDL cholesterol was 60 mg percent and hemoglobin A1c is 5.7.  He underwent transesophageal echocardiogram which showed no cardiac source of embolism.  He underwent loop recorder insertion for paroxysmal A. fib which so far has not yet been found.  Patient was discharged home on aspirin and Plavix for 3 weeks and is presently on aspirin alone which is tolerating well without side effects.  He states his left eye vision remains poor with barely light perception in the periphery.  He has had no stroke or TIA symptoms.  Is tolerating his Crestor well without side effects.  Patient seems quite upset today as he had to wait a lot for his appointment which was the last one of the day.  ROS:   14 system review of systems is positive for vision loss only all other systems  negative  PMH:  Past Medical History:  Diagnosis Date  . Allergy   . Aortic insufficiency    Mild, echo, January, 2014  . BCC (basal cell carcinoma), back 2017   s/p excisional biospy by derm  . BPH (benign prostatic hypertrophy)   . Chest discomfort    nuclear 06/24/09, suggestion prior small inferior MI with moderate peri-infarvt ischemia versus variable diaphragmatic attenuation  . Colon polyps    colonoscopy 07/02/06, repeat in 10 years  . Dyslipidemia   . Ejection fraction    EF 55%, echo, October, 2010  //   EF 45-50%,  catheterization, 2011  . Heart murmur   . Hyperlipidemia   . Migraines   . Mitral regurgitation    Mitral valve repair 2001  . Neurofibroma of foot 04/2017   s/p removal by derm  . Palpitations 07/2009    helped with carvedilol   . Pleurisy   . Postural dizziness    mild, no orthostatic changes by blood pressure check  . Pre-syncope    January, 2014  . RBBB    First seen January, 2014  . Right ventricular dysfunction    Mild/moderate right ventricular dysfunction by echo, January, 2014, no tricuspid regurgitation, therefore right heart pressure could not be estimated.  . Shortness of breath   . Status post mitral valve repair    2001,  //  echo, October, 2010, good function of the repaired mitral valve  . Stroke ([place])   .  SVT (supraventricular tachycardia) ([place]) 03/11/2013   Isolated episodes - last 03/2013 after flu treated with adenosine cardioversion in ER.   Marland Kitchen Thyroid disease     Social History:  Social History   Socioeconomic History  . Marital status: Married    Spouse name: Not on file  . Number of children: 2  . Years of education: Not on file  . Highest education level: Not on file  Occupational History  . Occupation: Artist  Tobacco Use  . Smoking status: Never Smoker  . Smokeless tobacco: Never Used  Substance and Sexual Activity  . Alcohol use: No  . Drug use: No  . Sexual activity: Yes  Other Topics Concern  . Not  on file  Social History Narrative   Widowed, wife deceased colon cancer 08/07/1996, remarried 2001, lives with remarried wife   Occupation: retired, worked for coffee Engineer, site   Activity: walking 30-73min 4-5d/wk   Diet: good water, fruits/vegetables daily   Social Determinants of Radio broadcast assistant Strain:   . Difficulty of Paying Living Expenses:   Food Insecurity:   . Worried About Charity fundraiser in the Last Year:   . Arboriculturist in the Last Year:   Transportation Needs:   . Film/video editor (Medical):   Marland Kitchen Lack of Transportation (Non-Medical):   Physical Activity:   . Days of Exercise per Week:   . Minutes of Exercise per Session:   Stress:   . Feeling of Stress :   Social Connections:   . Frequency of Communication with Friends and Family:   . Frequency of Social Gatherings with Friends and Family:   . Attends Religious Services:   . Active Member of Clubs or Organizations:   . Attends Archivist Meetings:   Marland Kitchen Marital Status:   Intimate Partner Violence:   . Fear of Current or Ex-Partner:   . Emotionally Abused:   Marland Kitchen Physically Abused:   . Sexually Abused:     Medications:   Current Outpatient Medications on File Prior to Visit  Medication Sig Dispense Refill  . aspirin 81 MG tablet Take 81 mg by mouth daily.    . butalbital-acetaminophen-caffeine (FIORICET, ESGIC) 50-325-40 MG tablet Take 1 tablet by mouth every 6 (six) hours as needed. (Patient taking differently: Take 1 tablet by mouth every 6 (six) hours as needed for headache or migraine. ) 30 tablet 1  . carvedilol (COREG) 6.25 MG tablet Take 1 tablet (6.25 mg total) by mouth 2 (two) times daily with a meal. 180 tablet 3  . diclofenac Sodium (VOLTAREN) 1 % GEL Apply 2 g topically as needed (knee pain).     Marland Kitchen ezetimibe (ZETIA) 10 MG tablet TAKE 1 TABLET(10 MG) BY MOUTH DAILY (Patient taking differently: Take 10 mg by mouth daily. TAKE 1 TABLET(10 MG) BY MOUTH DAILY) 90  tablet 3  . finasteride (PROSCAR) 5 MG tablet Take 1 tablet (5 mg total) by mouth daily. 90 tablet 3  . ibuprofen (ADVIL) 200 MG tablet Take 400 mg by mouth every 6 (six) hours as needed for moderate pain.    . Omega-3 Fatty Acids (FISH OIL) 1200 MG CAPS Take 1,200 mg by mouth daily. Reported on 02/23/2015    . simvastatin (ZOCOR) 80 MG tablet TAKE 1 TABLET(80 MG) BY MOUTH DAILY (Patient taking differently: Take 80 mg by mouth daily at 6 PM. TAKE 1 TABLET(80 MG) BY MOUTH DAILY) 90 tablet 3  . tamsulosin (FLOMAX) 0.4  MG CAPS capsule Take 1 capsule (0.4 mg total) by mouth at bedtime. 90 capsule 3   No current facility-administered medications on file prior to visit.    Allergies:  No Known Allergies  Physical Exam General: well developed, well nourished, seated, who is irritable and seems upset.   Head: head normocephalic and atraumatic.  Neck: supple with no carotid or supraclavicular bruits Cardiovascular: regular rate and rhythm, no murmurs Musculoskeletal: no deformity Skin:  no rash/petichiae Vascular:  Normal pulses all extremities Vitals:   07/24/19 1548  BP: 122/84  Pulse: 84   Neurologic Exam Mental Status: Awake and fully alert. Oriented to place and time. Recent and remote memory intact. Attention span, concentration and fund of knowledge appropriate. Mood and affect appropriate.  Cranial Nerves: Fundoscopic exam shows slight pallor of the left optic disc.. Pupils unequal, with right pupil 2 mm and left 4 mm with right reactive and left not reactive to light.  There is even no light perception in the left eye.  Extraocular movements full without nystagmus. Visual fields full to confrontation. Hearing intact. Facial sensation intact. Face, tongue, palate moves normally and symmetrically.  Motor: Normal bulk and tone. Normal strength in all tested extremity muscles. Sensory.: intact to touch ,pinprick .position and vibratory sensation.  Coordination: Rapid alternating movements  normal in all extremities. Finger-to-nose and heel-to-shin performed accurately bilaterally. Gait and Station: Arises from chair without difficulty. Stance is normal. Gait demonstrates normal stride length and balance . Able to heel, toe and tandem walk without difficulty.  Reflexes: 1+ and symmetric. Toes downgoing.   NIHSS  0 Modified Rankin  2  ASSESSMENT: 69 year old Caucasian male with left eye vision loss due to central retinal artery occlusion and April 2021 along with MRI scan showing silent by cerebral embolic infarcts of cryptogenic etiology.  Vascular risk factors of hyperlipidemia, hypertension and age only.     PLAN: I had a long d/w patient and his wife about his recent left eye vision loss due to central retinal artery occlusion and MRI findings of silent bilateral embolic strokes, risk for recurrent stroke/TIAs, personally independently reviewed imaging studies and stroke evaluation results and answered questions.Continue aspirin 81 mg daily  for secondary stroke prevention and maintain strict control of hypertension with blood pressure goal below 130/90, diabetes with hemoglobin A1c goal below 6.5% and lipids with LDL cholesterol goal below 70 mg/dL. I also advised the patient to eat a healthy diet with plenty of whole grains, cereals, fruits and vegetables, exercise regularly and maintain ideal body weight .Followup in the future with my nurse practitioner Janett Billow in 6 months or call earlier if necessary.  Greater than 50% of time during this 83minute visit was spent on counseling,explanation of diagnosis of central retinal artery occlusion and stroke risk, planning of further management, discussion with patient and family and coordination of care Antony Contras, MD  [facility] [address] [place], [place] [address]  Phone [phone] Fax [phone] Note: This document was prepared with digital dictation and possible smart phrase  technology. Any transcriptional errors that result from this process are unintentional

## 2019-07-24 NOTE — Patient Instructions (Addendum)
I had a long d/w patient and his wifeabout his recent left eye vision loss due to central retinal artery occlusion and MRI findings of silent bilateral embolic strokes, risk for recurrent stroke/TIAs, personally independently reviewed imaging studies and stroke evaluation results and answered questions.Continue aspirin 81 mg daily  for secondary stroke prevention and maintain strict control of hypertension with blood pressure goal below 130/90, diabetes with hemoglobin A1c goal below 6.5% and lipids with LDL cholesterol goal below 70 mg/dL. I also advised the patient to eat a healthy diet with plenty of whole grains, cereals, fruits and vegetables, exercise regularly and maintain ideal body weight .Followup in the future with my nurse practitioner Janett Billow in 6 months or call earlier if necessary.   Stroke Prevention Some medical conditions and behaviors are associated with a higher chance of having a stroke. You can help prevent a stroke by making nutrition, lifestyle, and other changes, including managing any medical conditions you may have. What nutrition changes can be made?   Eat healthy foods. You can do this by: ? Choosing foods high in fiber, such as fresh fruits and vegetables and whole grains. ? Eating at least 5 or more servings of fruits and vegetables a day. Try to fill half of your plate at each meal with fruits and vegetables. ? Choosing lean protein foods, such as lean cuts of meat, poultry without skin, fish, tofu, beans, and nuts. ? Eating low-fat dairy products. ? Avoiding foods that are high in salt (sodium). This can help lower blood pressure. ? Avoiding foods that have saturated fat, trans fat, and cholesterol. This can help prevent high cholesterol. ? Avoiding processed and premade foods.  Follow your health care provider's specific guidelines for losing weight, controlling high blood pressure (hypertension), lowering high cholesterol, and managing diabetes. These may  include: ? Reducing your daily calorie intake. ? Limiting your daily sodium intake to 1,500 milligrams (mg). ? Using only healthy fats for cooking, such as olive oil, canola oil, or sunflower oil. ? Counting your daily carbohydrate intake. What lifestyle changes can be made?  Maintain a healthy weight. Talk to your health care provider about your ideal weight.  Get at least 30 minutes of moderate physical activity at least 5 days a week. Moderate activity includes brisk walking, biking, and swimming.  Do not use any products that contain nicotine or tobacco, such as cigarettes and e-cigarettes. If you need help quitting, ask your health care provider. It may also be helpful to avoid exposure to secondhand smoke.  Limit alcohol intake to no more than 1 drink a day for nonpregnant women and 2 drinks a day for men. One drink equals 12 oz of beer, 5 oz of wine, or 1 oz of hard liquor.  Stop any illegal drug use.  Avoid taking birth control pills. Talk to your health care provider about the risks of taking birth control pills if: ? You are over 71 years old. ? You smoke. ? You get migraines. ? You have ever had a blood clot. What other changes can be made?  Manage your cholesterol levels. ? Eating a healthy diet is important for preventing high cholesterol. If cholesterol cannot be managed through diet alone, you may also need to take medicines. ? Take any prescribed medicines to control your cholesterol as told by your health care provider.  Manage your diabetes. ? Eating a healthy diet and exercising regularly are important parts of managing your blood sugar. If your blood sugar cannot be managed  through diet and exercise, you may need to take medicines. ? Take any prescribed medicines to control your diabetes as told by your health care provider.  Control your hypertension. ? To reduce your risk of stroke, try to keep your blood pressure below 130/80. ? Eating a healthy diet and  exercising regularly are an important part of controlling your blood pressure. If your blood pressure cannot be managed through diet and exercise, you may need to take medicines. ? Take any prescribed medicines to control hypertension as told by your health care provider. ? Ask your health care provider if you should monitor your blood pressure at home. ? Have your blood pressure checked every year, even if your blood pressure is normal. Blood pressure increases with age and some medical conditions.  Get evaluated for sleep disorders (sleep apnea). Talk to your health care provider about getting a sleep evaluation if you snore a lot or have excessive sleepiness.  Take over-the-counter and prescription medicines only as told by your health care provider. Aspirin or blood thinners (antiplatelets or anticoagulants) may be recommended to reduce your risk of forming blood clots that can lead to stroke.  Make sure that any other medical conditions you have, such as atrial fibrillation or atherosclerosis, are managed. What are the warning signs of a stroke? The warning signs of a stroke can be easily remembered as BEFAST.  B is for balance. Signs include: ? Dizziness. ? Loss of balance or coordination. ? Sudden trouble walking.  E is for eyes. Signs include: ? A sudden change in vision. ? Trouble seeing.  F is for face. Signs include: ? Sudden weakness or numbness of the face. ? The face or eyelid drooping to one side.  A is for arms. Signs include: ? Sudden weakness or numbness of the arm, usually on one side of the body.  S is for speech. Signs include: ? Trouble speaking (aphasia). ? Trouble understanding.  T is for time. ? These symptoms may represent a serious problem that is an emergency. Do not wait to see if the symptoms will go away. Get medical help right away. Call your local emergency services (911 in the [place] Do not drive yourself to the hospital.  Other signs of stroke  may include: ? A sudden, severe headache with no known cause. ? Nausea or vomiting. ? Seizure. Where to find more information For more information, visit:  American Stroke Association: www.strokeassociation.org  National Stroke Association: www.stroke.org Summary  You can prevent a stroke by eating healthy, exercising, not smoking, limiting alcohol intake, and managing any medical conditions you may have.  Do not use any products that contain nicotine or tobacco, such as cigarettes and e-cigarettes. If you need help quitting, ask your health care provider. It may also be helpful to avoid exposure to secondhand smoke.  Remember BEFAST for warning signs of stroke. Get help right away if you or a loved one has any of these signs. This information is not intended to replace advice given to you by your health care provider. Make sure you discuss any questions you have with your health care provider. Document Revised: 02/02/2017 Document Reviewed: 03/28/2016 Elsevier Patient Education  2020 [employer].

## 2019-07-29 NOTE — Telephone Encounter (Signed)
L   lets wait and see if he has HCM, a high risk situation for afib; and follow for longer episodes of afib. Thanks SK

## 2019-08-08 ENCOUNTER — Telehealth (HOSPITAL_COMMUNITY): Payer: Self-pay | Admitting: *Deleted

## 2019-08-08 NOTE — Telephone Encounter (Signed)
Reaching out to patient to offer assistance regarding upcoming cardiac imaging study; pt verbalizes understanding of appt date/time, parking situation and where to check in, and verified current allergies; name and call back number provided for further questions should they arise  [facility] [phone] office [phone] cell

## 2019-08-10 ENCOUNTER — Encounter: Payer: Self-pay | Admitting: Cardiology

## 2019-08-10 DIAGNOSIS — I517 Cardiomegaly: Secondary | ICD-10-CM | POA: Insufficient documentation

## 2019-08-11 ENCOUNTER — Ambulatory Visit (HOSPITAL_COMMUNITY)
Admission: RE | Admit: 2019-08-11 | Discharge: 2019-08-11 | Disposition: A | Discharge status: Home / Self Care | Payer: Medicare Other | Source: Ambulatory Visit | Attending: Student | Admitting: Student

## 2019-08-11 ENCOUNTER — Other Ambulatory Visit: Payer: Self-pay

## 2019-08-11 DIAGNOSIS — I429 Cardiomyopathy, unspecified: Secondary | ICD-10-CM | POA: Insufficient documentation

## 2019-08-11 MED ORDER — GADOBUTROL 1 MMOL/ML IV SOLN
10.0000 mL | Freq: Once | INTRAVENOUS | Status: AC | PRN
  Administered 2019-08-11: 10 mL via INTRAVENOUS

## 2019-08-12 ENCOUNTER — Telehealth: Payer: Self-pay

## 2019-08-12 NOTE — Telephone Encounter (Signed)
The patient has been notified of the Cardiac MRI result and verbalized understanding.  All questions (if any) were answered. Frederik Schmidt, RN 08/12/2019 8:07 AM

## 2019-08-12 NOTE — Telephone Encounter (Signed)
-----   Message from Shirley Friar, PA-C sent at 08/11/2019 12:54 PM EDT ----- Please let this patient know that his cardiac MRI showed normal left ventricular size and function.   No evidence of hypertrophic cardiomyopathy. Will also forward to Dr. Caryl Comes.   Thank you!  Legrand Como [address] [place], PA-C  08/11/2019 12:54 PM

## 2019-08-25 ENCOUNTER — Other Ambulatory Visit: Payer: Self-pay

## 2019-08-25 ENCOUNTER — Ambulatory Visit (INDEPENDENT_AMBULATORY_CARE_PROVIDER_SITE_OTHER): Payer: Medicare Other | Admitting: *Deleted

## 2019-08-25 ENCOUNTER — Encounter (INDEPENDENT_AMBULATORY_CARE_PROVIDER_SITE_OTHER): Payer: Self-pay | Admitting: Ophthalmology

## 2019-08-25 ENCOUNTER — Ambulatory Visit (INDEPENDENT_AMBULATORY_CARE_PROVIDER_SITE_OTHER): Payer: Medicare Other | Admitting: Ophthalmology

## 2019-08-25 DIAGNOSIS — H3412 Central retinal artery occlusion, left eye: Secondary | ICD-10-CM

## 2019-08-25 DIAGNOSIS — I63449 Cerebral infarction due to embolism of unspecified cerebellar artery: Secondary | ICD-10-CM | POA: Diagnosis not present

## 2019-08-25 NOTE — Progress Notes (Signed)
08/25/2019     CHIEF COMPLAINT Patient presents for Retina Follow Up   HISTORY OF PRESENT ILLNESS: Alec Maldonado is a 69 y.o. male who presents to the clinic today for:   HPI    Retina Follow Up    Patient presents with  Other.  In left eye.  This started 8 weeks ago.  Severity is moderate.  Duration of 8 weeks.  Since onset it is stable.          Comments    8 Week F/U OS for central retinal artery occlusion left eye.  No change in vision over the last weeks.  Pt denies noticeable changes to [place] OU since last visit. Pt denies ocular pain, flashes of light, or floaters OU.         Last edited by Hurman Horn, MD on 08/25/2019  3:02 PM. (History)      Referring physician: Ria Bush, MD [place],  [place] [address]  HISTORICAL INFORMATION:   Selected notes from the MEDICAL RECORD NUMBER    Lab Results  Component Value Date   HGBA1C 5.7 (H) 06/17/2019     CURRENT MEDICATIONS: No current outpatient medications on file. (Ophthalmic Drugs)   No current facility-administered medications for this visit. (Ophthalmic Drugs)   Current Outpatient Medications (Other)  Medication Sig  . aspirin 81 MG tablet Take 81 mg by mouth daily.  . butalbital-acetaminophen-caffeine (FIORICET, ESGIC) 50-325-40 MG tablet Take 1 tablet by mouth every 6 (six) hours as needed. (Patient taking differently: Take 1 tablet by mouth every 6 (six) hours as needed for headache or migraine. )  . carvedilol (COREG) 6.25 MG tablet Take 1 tablet (6.25 mg total) by mouth 2 (two) times daily with a meal.  . diclofenac Sodium (VOLTAREN) 1 % GEL Apply 2 g topically as needed (knee pain).   Marland Kitchen ezetimibe (ZETIA) 10 MG tablet TAKE 1 TABLET(10 MG) BY MOUTH DAILY (Patient taking differently: Take 10 mg by mouth daily. TAKE 1 TABLET(10 MG) BY MOUTH DAILY)  . finasteride (PROSCAR) 5 MG tablet Take 1 tablet (5 mg total) by mouth daily.  Marland Kitchen ibuprofen (ADVIL) 200 MG tablet Take 400 mg by mouth  every 6 (six) hours as needed for moderate pain.  . Omega-3 Fatty Acids (FISH OIL) 1200 MG CAPS Take 1,200 mg by mouth daily. Reported on 02/23/2015  . simvastatin (ZOCOR) 80 MG tablet TAKE 1 TABLET(80 MG) BY MOUTH DAILY (Patient taking differently: Take 80 mg by mouth daily at 6 PM. TAKE 1 TABLET(80 MG) BY MOUTH DAILY)  . tamsulosin (FLOMAX) 0.4 MG CAPS capsule Take 1 capsule (0.4 mg total) by mouth at bedtime.   No current facility-administered medications for this visit. (Other)      REVIEW OF SYSTEMS:    ALLERGIES No Known Allergies  PAST MEDICAL HISTORY Past Medical History:  Diagnosis Date  . Allergy   . Aortic insufficiency    Mild, echo, January, 2014  . BCC (basal cell carcinoma), back 2017   s/p excisional biospy by derm  . BPH (benign prostatic hypertrophy)   . Chest discomfort    nuclear 06/24/09, suggestion prior small inferior MI with moderate peri-infarvt ischemia versus variable diaphragmatic attenuation  . Colon polyps    colonoscopy 07/02/06, repeat in 10 years  . Dyslipidemia   . Ejection fraction    EF 55%, echo, October, 2010  //   EF 45-50%,  catheterization, 2011  . Heart murmur   . Hyperlipidemia   .  Migraines   . Mitral regurgitation    Mitral valve repair 2001  . Neurofibroma of foot 04/2017   s/p removal by derm  . Palpitations 07/2009    helped with carvedilol   . Pleurisy   . Postural dizziness    mild, no orthostatic changes by blood pressure check  . Pre-syncope    January, 2014  . RBBB    First seen January, 2014  . Right ventricular dysfunction    Mild/moderate right ventricular dysfunction by echo, January, 2014, no tricuspid regurgitation, therefore right heart pressure could not be estimated.  . Shortness of breath   . Status post mitral valve repair    2001,  //  echo, October, 2010, good function of the repaired mitral valve  . Stroke ([facility])   . SVT (supraventricular tachycardia) ([facility]) 03/11/2013   Isolated episodes - last 03/2013  after flu treated with adenosine cardioversion in ER.   Marland Kitchen Thyroid disease    Past Surgical History:  Procedure Laterality Date  . aneurysmal dilatation     proximal LAD cath 2001, reoeat catheter 2004, no significant change  . aneurysmal dilatiation  07/15/09   no change, normal right heart filling pressures and LVEDP- EF 45-50%  . APPENDECTOMY  1970  . BUBBLE STUDY  06/18/2019   Procedure: BUBBLE STUDY;  Surgeon: Sueanne Margarita, MD;  Location: [facility] ENDOSCOPY;  Service: Cardiovascular;;  . CARDIAC CATHETERIZATION  2010   mild global LV dysfunction EF 45-50%, no significant CAD obstruction, aneurysmal dilatation LAD  . COLONOSCOPY  2008  . COLONOSCOPY  06/2016   diverticulosis, rpt 10 yrs Ardis Hughs)  . CORONARY ANGIOPLASTY  09/1999   severe MR o/w normal limits  . CORONARY ANGIOPLASTY  12/19/02   pos. CAD, mild LV dysfunction- med treatment  . EYE SURGERY Left   . HERNIA REPAIR  01/11/05   left, Dr. Hassell Done  . I & D EXTREMITY Right 02/04/2015   Procedure: IRRIGATION AND DEBRIDEMENT RIGHT INDEX FINGER OPEN FRACTURE AND REPAIR DIGITAL NERVE AND ARTERY;  Surgeon: Leanora Cover, MD;  Location: [facility];  Service: Orthopedics;  Laterality: Right;  . LOOP RECORDER INSERTION N/A 06/18/2019   Procedure: LOOP RECORDER INSERTION;  Surgeon: Deboraha Sprang, MD;  Location: [facility] CV LAB;  Service: Cardiovascular;  Laterality: N/A;  . MITRAL VALVE REPAIR  01/22/00   good mitral valve function- echo 12/2008- EF 55% / nuclear 06/2009- EF 36%,? reliable? / EF 45-50%, cath rated 02/2010  . ORIF ELBOW FRACTURE     left  . POLYPECTOMY    . TEE WITHOUT CARDIOVERSION N/A 06/18/2019   Procedure: TRANSESOPHAGEAL ECHOCARDIOGRAM (TEE);  Surgeon: Sueanne Margarita, MD;  Location: [facility] ENDOSCOPY;  Service: Cardiovascular;  Laterality: N/A;  . THYROID SURGERY    . THYROIDECTOMY     left, due to goiter 1994    FAMILY HISTORY Family History  Problem Relation Age of Onset  . Alzheimer's disease Mother   . Heart disease  Mother        CHF  . Arthritis Father   . Heart disease Father        leaking mitral valve  . Benign prostatic hyperplasia Father   . Heart disease Brother        murmur  . Hypertension Neg Hx   . Diabetes Neg Hx   . Depression Neg Hx   . Alcohol abuse Neg Hx   . Drug abuse Neg Hx   . Stroke Neg Hx   . Cancer Neg Hx   .  Colon cancer Neg Hx   . Esophageal cancer Neg Hx   . Rectal cancer Neg Hx   . Stomach cancer Neg Hx     SOCIAL HISTORY Social History   Tobacco Use  . Smoking status: Never Smoker  . Smokeless tobacco: Never Used  Vaping Use  . Vaping Use: Never used  Substance Use Topics  . Alcohol use: No  . Drug use: No         OPHTHALMIC EXAM:  Base Eye Exam    Visual Acuity (ETDRS)      Right Left   Dist [place] 20/25 HM   Dist ph [place]  NI       Tonometry (Tonopen, 2:05 PM)      Right Left   Pressure 15 13       Pupils      Dark Light Shape React APD   Right 4 3 Round Brisk None   Left 4 3 Round Brisk +1       Visual Fields (Counting fingers)      Left Right   Restrictions Total superior temporal, inferior temporal, superior nasal, inferior nasal deficiencies        Extraocular Movement      Right Left    Full Full       Neuro/Psych    Oriented x3: Yes   Mood/Affect: Normal       Dilation    Left eye: 1.0% Mydriacyl, 2.5% Phenylephrine @ 2:05 PM        Slit Lamp and Fundus Exam    External Exam      Right Left   External Normal Normal       Slit Lamp Exam      Right Left   Lids/Lashes Normal Normal   Conjunctiva/Sclera White and quiet White and quiet   Cornea Clear Clear   Anterior Chamber Deep and quiet Deep and quiet   Iris Round and reactive Round and reactive, sluggish   Lens Posterior chamber intraocular lens Posterior chamber intraocular lens   Anterior Vitreous Normal Normal       Fundus Exam      Right Left   Posterior Vitreous  Central vitreous floaters   Disc  Peripapillary atrophy,,, 2+ Optic disc atrophy, 3+  Pallor, small peripapillary hemorrhages.  No neovascularization   C/D Ratio  0.4   Macula  Diffuse macular atrophy   Vessels  Arteriolar narrowing,, attenuated particularly in the macula.   Periphery  Normal          IMAGING AND PROCEDURES  Imaging and Procedures for 08/25/19  Color Fundus Photography Optos - OU - Both Eyes       Right Eye Progression has been stable. Macula : normal observations. Vessels : normal observations. Periphery : normal observations.   Left Eye Progression has been stable. Disc findings include pallor, thinning of rim, hemorrhage. Vessels : normal observations. Periphery : normal observations.   Notes Small peripapillary hemorrhages, no retinal neovascularization.                ASSESSMENT/PLAN:  Central retinal artery occlusion of left eye No signs of complications from the central retinal artery occlusion left eye.  Medical work-up systemically was negative for carotid disease or heart disease.      ICD-10-CM   1. Central retinal artery occlusion of left eye  H34.12 Color Fundus Photography Optos - OU - Both Eyes    1.  No retinal neovascularization, no complications.  2.  3.  Ophthalmic Meds Ordered this visit:  No orders of the defined types were placed in this encounter.      Return in about 4 months (around 12/25/2019) for DILATE OU, COLOR FP.  Patient Instructions  Patient is to contact the office should severe achy pain develop or deep redness to the left eye    Explained the diagnoses, plan, and follow up with the patient and they expressed understanding.  Patient expressed understanding of the importance of proper follow up care.   Clent Demark Aryiana Klinkner M.D. Diseases & Surgery of the Retina and Vitreous [facility] [place] 08/25/19     Abbreviations: M myopia (nearsighted); A astigmatism; H hyperopia (farsighted); P presbyopia; Mrx spectacle prescription;  CTL contact lenses; OD right eye; OS left eye; OU  both eyes  XT exotropia; ET esotropia; PEK punctate epithelial keratitis; PEE punctate epithelial erosions; DES dry eye syndrome; MGD meibomian gland dysfunction; ATs artificial tears; PFAT's preservative free artificial tears; [place] nuclear sclerotic cataract; PSC posterior subcapsular cataract; ERM epi-retinal membrane; PVD posterior vitreous detachment; RD retinal detachment; DM diabetes mellitus; DR diabetic retinopathy; NPDR non-proliferative diabetic retinopathy; PDR proliferative diabetic retinopathy; CSME clinically significant macular edema; DME diabetic macular edema; dbh dot blot hemorrhages; CWS cotton wool spot; POAG primary open angle glaucoma; C/D cup-to-disc ratio; HVF humphrey visual field; GVF goldmann visual field; OCT optical coherence tomography; IOP intraocular pressure; BRVO Branch retinal vein occlusion; CRVO central retinal vein occlusion; CRAO central retinal artery occlusion; BRAO branch retinal artery occlusion; RT retinal tear; SB scleral buckle; PPV pars plana vitrectomy; VH Vitreous hemorrhage; PRP panretinal laser photocoagulation; IVK intravitreal kenalog; VMT vitreomacular traction; MH Macular hole;  NVD neovascularization of the disc; NVE neovascularization elsewhere; AREDS age related eye disease study; ARMD age related macular degeneration; POAG primary open angle glaucoma; EBMD epithelial/anterior basement membrane dystrophy; ACIOL anterior chamber intraocular lens; IOL intraocular lens; PCIOL posterior chamber intraocular lens; Phaco/IOL phacoemulsification with intraocular lens placement; [place] photorefractive keratectomy; LASIK laser assisted in situ keratomileusis; HTN hypertension; DM diabetes mellitus; COPD chronic obstructive pulmonary disease

## 2019-08-25 NOTE — Assessment & Plan Note (Signed)
No signs of complications from the central retinal artery occlusion left eye.  Medical work-up systemically was negative for carotid disease or heart disease.

## 2019-08-25 NOTE — Patient Instructions (Signed)
Patient is to contact the office should severe achy pain develop or deep redness to the left eye

## 2019-08-26 LAB — CUP PACEART REMOTE DEVICE CHECK
Date Time Interrogation Session: 20210620032244
Implantable Pulse Generator Implant Date: 20210414

## 2019-08-26 NOTE — Progress Notes (Signed)
[facility] Summary Report / Loop Recorder 

## 2019-09-23 ENCOUNTER — Ambulatory Visit: Payer: Medicare Other | Admitting: Cardiology

## 2019-09-29 ENCOUNTER — Ambulatory Visit (INDEPENDENT_AMBULATORY_CARE_PROVIDER_SITE_OTHER): Payer: Medicare Other | Admitting: *Deleted

## 2019-09-29 DIAGNOSIS — I63449 Cerebral infarction due to embolism of unspecified cerebellar artery: Secondary | ICD-10-CM

## 2019-09-30 LAB — CUP PACEART REMOTE DEVICE CHECK
Date Time Interrogation Session: 20210725230626
Implantable Pulse Generator Implant Date: 20210414

## 2019-10-02 NOTE — Progress Notes (Signed)
[facility] Summary Report / Loop Recorder 

## 2019-10-07 ENCOUNTER — Encounter: Payer: Self-pay | Admitting: Family Medicine

## 2019-10-07 ENCOUNTER — Ambulatory Visit (INDEPENDENT_AMBULATORY_CARE_PROVIDER_SITE_OTHER): Payer: Medicare Other | Admitting: Family Medicine

## 2019-10-07 ENCOUNTER — Other Ambulatory Visit: Payer: Self-pay

## 2019-10-07 VITALS — BP 130/76 | HR 77 | Temp 97.8°F | Ht 70.0 in | Wt 235.5 lb

## 2019-10-07 DIAGNOSIS — Z8673 Personal history of transient ischemic attack (TIA), and cerebral infarction without residual deficits: Secondary | ICD-10-CM | POA: Diagnosis not present

## 2019-10-07 DIAGNOSIS — H3412 Central retinal artery occlusion, left eye: Secondary | ICD-10-CM

## 2019-10-07 DIAGNOSIS — R194 Change in bowel habit: Secondary | ICD-10-CM

## 2019-10-07 DIAGNOSIS — M545 Low back pain, unspecified: Secondary | ICD-10-CM

## 2019-10-07 DIAGNOSIS — R21 Rash and other nonspecific skin eruption: Secondary | ICD-10-CM

## 2019-10-07 DIAGNOSIS — N429 Disorder of prostate, unspecified: Secondary | ICD-10-CM

## 2019-10-07 LAB — POC URINALSYSI DIPSTICK (AUTOMATED)
Bilirubin, UA: NEGATIVE
Blood, UA: NEGATIVE
Glucose, UA: NEGATIVE
Ketones, UA: NEGATIVE
Leukocytes, UA: NEGATIVE
Nitrite, UA: NEGATIVE
Protein, UA: NEGATIVE
Spec Grav, UA: 1.01 (ref 1.010–1.025)
Urobilinogen, UA: 0.2 E.U./dL
pH, UA: 6 (ref 5.0–8.0)

## 2019-10-07 NOTE — Patient Instructions (Addendum)
For right lower back pain - possible lumbar strain. Treat with heating pad and tylenol as needed, do exercises provided today.  Let us know if not improving over 2-3 weeks.  For new stool urgency I would like you to see GI doctor - we will place referral.  Urology referral also placed today.  For bottom rash - start clotrimazole antifungal twice daily for 2 weeks. Also use desitin barrier cream to bottom.

## 2019-10-07 NOTE — Progress Notes (Signed)
This visit was conducted in person.  BP 130/76 (BP Location: Left Arm, Patient Position: Sitting, Cuff Size: Large)   Pulse 77   Temp 97.8 F (36.6 C) (Temporal)   Ht 5\' 10"  (1.778 m)   Wt 235 lb 8 oz (106.8 kg)   SpO2 97%   BMI 33.79 kg/m    CC: R lower back pain  Subjective:    Patient ID: Alec Maldonado, male    DOB: 1950-08-04, 69 y.o.   MRN: [id number]  HPI: Alec Maldonado is a 69 y.o. male presenting on 10/07/2019 for Back Pain (C/o right side low back pain.  Started about 2 wks ago. )   R lower back pain present for 2 weeks. Denies inciting trauma/injury or falls. Treating with cranberry juice and sparing ibuprofen. Has increased water intake. No urinary changes. No hematuria.   Notes stool change - trouble fully cleaning after BM, watery leaking throughout the day for the past 2 weeks - happening every day. No black tarry stools. No diet changes recently. No blood in stool. Stools not greasy. No abd pain, nausea.  COLONOSCOPY 06/2016 diverticulosis, rpt 10 yrs Ardis Hughs)   Recent L eye vision loss diagnosed with L central retinal artery CVA. Seeing cards and eye doctor - cryptogenic stroke so far.   We did not refer to urology at [facility] (see note)- new referral placed today.      Relevant past medical, surgical, family and social history reviewed and updated as indicated. Interim medical history since our last visit reviewed. Allergies and medications reviewed and updated. Outpatient Medications Prior to Visit  Medication Sig Dispense Refill  . aspirin 81 MG tablet Take 81 mg by mouth daily.    . butalbital-acetaminophen-caffeine (FIORICET, ESGIC) 50-325-40 MG tablet Take 1 tablet by mouth every 6 (six) hours as needed. (Patient taking differently: Take 1 tablet by mouth every 6 (six) hours as needed for headache or migraine. ) 30 tablet 1  . carvedilol (COREG) 6.25 MG tablet Take 1 tablet (6.25 mg total) by mouth 2 (two) times daily with a meal. 180 tablet 3  . diclofenac Sodium  (VOLTAREN) 1 % GEL Apply 2 g topically as needed (knee pain).     Marland Kitchen ezetimibe (ZETIA) 10 MG tablet TAKE 1 TABLET(10 MG) BY MOUTH DAILY (Patient taking differently: Take 10 mg by mouth daily. TAKE 1 TABLET(10 MG) BY MOUTH DAILY) 90 tablet 3  . finasteride (PROSCAR) 5 MG tablet Take 1 tablet (5 mg total) by mouth daily. 90 tablet 3  . ibuprofen (ADVIL) 200 MG tablet Take 400 mg by mouth every 6 (six) hours as needed for moderate pain.    . Omega-3 Fatty Acids (FISH OIL) 1200 MG CAPS Take 1,200 mg by mouth daily. Reported on 02/23/2015    . simvastatin (ZOCOR) 80 MG tablet TAKE 1 TABLET(80 MG) BY MOUTH DAILY (Patient taking differently: Take 80 mg by mouth daily at 6 PM. TAKE 1 TABLET(80 MG) BY MOUTH DAILY) 90 tablet 3  . tamsulosin (FLOMAX) 0.4 MG CAPS capsule Take 1 capsule (0.4 mg total) by mouth at bedtime. 90 capsule 3   No facility-administered medications prior to visit.     Per HPI unless specifically indicated in ROS section below Review of Systems Objective:  BP 130/76 (BP Location: Left Arm, Patient Position: Sitting, Cuff Size: Large)   Pulse 77   Temp 97.8 F (36.6 C) (Temporal)   Ht 5\' 10"  (1.778 m)   Wt 235 lb 8 oz (106.8 kg)  SpO2 97%   BMI 33.79 kg/m   Wt Readings from Last 3 Encounters:  10/13/19 233 lb 9.6 oz (106 kg)  10/07/19 235 lb 8 oz (106.8 kg)  07/24/19 234 lb (106.1 kg)      Physical Exam Vitals and nursing note reviewed.  Constitutional:      Appearance: Normal appearance. He is not ill-appearing.  Abdominal:     General: Abdomen is flat. Bowel sounds are normal. There is no distension.     Palpations: Abdomen is soft. There is no mass.     Tenderness: There is abdominal tenderness (mild) in the right lower quadrant. There is no right CVA tenderness, left CVA tenderness, guarding or rebound. Negative signs include Murphy's sign.     Hernia: No hernia is present.  Genitourinary:    Rectum: No anal fissure or external hemorrhoid.     Comments: Raw  erythematous perianal skin Musculoskeletal:     Right lower leg: No edema.     Left lower leg: No edema.     Comments:  No pain midline spine No paraspinous mm tenderness Neg SLR bilaterally. No pain with int/ext rotation at hip. Neg FABER. No pain at SIJ, GTB or sciatic notch bilaterally.   Skin:    General: Skin is warm and dry.     Findings: No rash.  Neurological:     Mental Status: He is alert.  Psychiatric:        Mood and Affect: Mood normal.        Behavior: Behavior normal.        Assessment & Plan:  This visit occurred during the SARS-CoV-2 public health emergency.  Safety protocols were in place, including screening questions prior to the visit, additional usage of staff PPE, and extensive cleaning of exam room while observing appropriate contact time as indicated for disinfecting solutions.   Problem List Items Addressed This Visit    Perianal rash    Rawness to perianal skin likely from recent looser difficult to clean stools - rec clotrimazole + barrier cream. Update if not improving.       History 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   Central retinal artery occlusion of left eye    L vision loss persists. Unrevealing workup. Continues aspirin, zetia, high dose simvastatin.       Bowel habit changes    Noticing daily change to bowels for the past 2 weeks with stool urgency associated with difficult to clean bowel movements and some watery leaking throughout the day. Exam with raw skin perianally but no obvious hemorrhoid or fissure. Will refer to GI for further evaluation. He is UTD on colonoscopy.       Acute right-sided low back pain - Primary    Anticipate lumbar strain, does have prior h/o R sciatica.  No red flags on exam.  Supportive care reviewed - rec ice, heating pad, tylenol. Update if not improving with treatment.       Relevant Orders   POCT Urinalysis Dipstick (Automated) (Completed)   Abnormal  prostate by palpation    Normal PSA however ?induration to R lobe - urology referral placed.       Relevant Orders   Ambulatory referral to Urology       No orders of the defined types were placed in this encounter.  Orders Placed This Encounter  Procedures  . Ambulatory referral to Urology    Referral Priority:   Routine    Referral Type:  Consultation    Referral Reason:   Specialty Services Required    Requested Specialty:   Urology    Number of Visits Requested:   1  . POCT Urinalysis Dipstick (Automated)    Patient Instructions  For right lower back pain - possible lumbar strain. Treat with heating pad and tylenol as needed, do exercises provided today.  Let us know if not improving over 2-3 weeks.  For new stool urgency I would like you to see GI doctor - we will place referral.  Urology referral also placed today.  For bottom rash - start clotrimazole antifungal twice daily for 2 weeks. Also use desitin barrier cream to bottom.    Follow up plan: Return if symptoms worsen or fail to improve.  Ria Bush, MD

## 2019-10-12 NOTE — Progress Notes (Signed)
10/13/2019 9:57 AM   Alec Maldonado Nov 07, 1950 [id number]  Referring provider: Ria Bush, MD [address] [place],  [place] [address] Chief Complaint  Patient presents with   New Patient (Initial Visit)    Abnormal prostate    HPI: Alec Maldonado is a 69 y.o. male seen at the request of Dr. Danise Mina for evaluation of an abnormal DRE.    -PCP visit 10/07/2019 remarkable for abnormal DRE -He notes mild LUTS with urgency and frequency; occasional urge incontinence and postvoid dribbling -On tamsulosin and finasteride -No FHx of prostate cancer in first-degree relative; grandfather died of prostate cancer  -PSA was 0.59 as of 05/08/2019.   PMH: Past Medical History:  Diagnosis Date   Allergy    Aortic insufficiency    Mild, echo, January, 2014   BCC (basal cell carcinoma), back 2017   s/p excisional biospy by derm   BPH (benign prostatic hypertrophy)    Chest discomfort    nuclear 06/24/09, suggestion prior small inferior MI with moderate peri-infarvt ischemia versus variable diaphragmatic attenuation   Colon polyps    colonoscopy 07/02/06, repeat in 10 years   Dyslipidemia    Ejection fraction    EF 55%, echo, October, 2010  //   EF 45-50%,  catheterization, 2011   Heart murmur    Hyperlipidemia    Migraines    Mitral regurgitation    Mitral valve repair 2001   Neurofibroma of foot 04/2017   s/p removal by derm   Palpitations 07/2009    helped with carvedilol    Pleurisy    Postural dizziness    mild, no orthostatic changes by blood pressure check   Pre-syncope    January, 2014   RBBB    First seen January, 2014   Right ventricular dysfunction    Mild/moderate right ventricular dysfunction by echo, January, 2014, no tricuspid regurgitation, therefore right heart pressure could not be estimated.   Shortness of breath    Status post mitral valve repair    2001,  //  echo, October, 2010, good function of the repaired mitral valve     Stroke [facility])    SVT (supraventricular tachycardia) ([place]) 03/11/2013   Isolated episodes - last 03/2013 after flu treated with adenosine cardioversion in ER.    Thyroid disease     Surgical History: Past Surgical History:  Procedure Laterality Date   aneurysmal dilatation     proximal LAD cath 2001, reoeat catheter 2004, no significant change   aneurysmal dilatiation  07/15/09   no change, normal right heart filling pressures and LVEDP- EF 45-50%   APPENDECTOMY  1970   BUBBLE STUDY  06/18/2019   Procedure: BUBBLE STUDY;  Surgeon: Sueanne Margarita, MD;  Location: [facility] ENDOSCOPY;  Service: Cardiovascular;;   CARDIAC CATHETERIZATION  2010   mild global LV dysfunction EF 45-50%, no significant CAD obstruction, aneurysmal dilatation LAD   COLONOSCOPY  2008   COLONOSCOPY  06/2016   diverticulosis, rpt 10 yrs Ardis Hughs)   CORONARY ANGIOPLASTY  09/1999   severe MR o/w normal limits   CORONARY ANGIOPLASTY  12/19/02   pos. CAD, mild LV dysfunction- med treatment   EYE SURGERY Left    HERNIA REPAIR  01/11/05   left, Dr. Hassell Done   I & D EXTREMITY Right 02/04/2015   Procedure: IRRIGATION AND DEBRIDEMENT RIGHT INDEX FINGER OPEN FRACTURE AND REPAIR DIGITAL NERVE AND ARTERY;  Surgeon: Leanora Cover, MD;  Location: [facility];  Service: Orthopedics;  Laterality: Right;  LOOP RECORDER INSERTION N/A 06/18/2019   Procedure: LOOP RECORDER INSERTION;  Surgeon: Deboraha Sprang, MD;  Location: [facility] CV LAB;  Service: Cardiovascular;  Laterality: N/A;   MITRAL VALVE REPAIR  01/22/00   good mitral valve function- echo 12/2008- EF 55% / nuclear 06/2009- EF 36%,? reliable? / EF 45-50%, cath rated 02/2010   ORIF ELBOW FRACTURE     left   POLYPECTOMY     TEE WITHOUT CARDIOVERSION N/A 06/18/2019   Procedure: TRANSESOPHAGEAL ECHOCARDIOGRAM (TEE);  Surgeon: Sueanne Margarita, MD;  Location: [facility] ENDOSCOPY;  Service: Cardiovascular;  Laterality: N/A;   THYROID SURGERY     THYROIDECTOMY     left, due to  goiter 1994    Home Medications:  Allergies as of 10/13/2019   No Known Allergies     Medication List       Accurate as of October 13, 2019  9:57 AM. If you have any questions, ask your nurse or doctor.        aspirin 81 MG tablet Take 81 mg by mouth daily.   butalbital-acetaminophen-caffeine 50-325-40 MG tablet Commonly known as: FIORICET Take 1 tablet by mouth every 6 (six) hours as needed. What changed: reasons to take this   carvedilol 6.25 MG tablet Commonly known as: COREG Take 1 tablet (6.25 mg total) by mouth 2 (two) times daily with a meal.   ezetimibe 10 MG tablet Commonly known as: ZETIA TAKE 1 TABLET(10 MG) BY MOUTH DAILY What changed:   how much to take  how to take this  when to take this   finasteride 5 MG tablet Commonly known as: PROSCAR Take 1 tablet (5 mg total) by mouth daily.   Fish Oil 1200 MG Caps Take 1,200 mg by mouth daily. Reported on 02/23/2015   ibuprofen 200 MG tablet Commonly known as: ADVIL Take 400 mg by mouth every 6 (six) hours as needed for moderate pain.   simvastatin 80 MG tablet Commonly known as: ZOCOR TAKE 1 TABLET(80 MG) BY MOUTH DAILY What changed:   how much to take  how to take this  when to take this   tamsulosin 0.4 MG Caps capsule Commonly known as: FLOMAX Take 1 capsule (0.4 mg total) by mouth at bedtime.   Voltaren 1 % Gel Generic drug: diclofenac Sodium Apply 2 g topically as needed (knee pain).       Allergies: No Known Allergies  Family History: Family History  Problem Relation Age of Onset   Alzheimer's disease Mother    Heart disease Mother        CHF   Arthritis Father    Heart disease Father        leaking mitral valve   Benign prostatic hyperplasia Father    Heart disease Brother        murmur   Hypertension Neg Hx    Diabetes Neg Hx    Depression Neg Hx    Alcohol abuse Neg Hx    Drug abuse Neg Hx    Stroke Neg Hx    Cancer Neg Hx    Colon cancer Neg Hx     Esophageal cancer Neg Hx    Rectal cancer Neg Hx    Stomach cancer Neg Hx     Social History:  reports that he has never smoked. He has never used smokeless tobacco. He reports that he does not drink alcohol and does not use drugs.   Physical Exam: BP 129/80    Pulse 92  Ht 5\' 10"  (1.778 m)    Wt 233 lb 9.6 oz (106 kg)    BMI 33.52 kg/m   Constitutional:  Alert and oriented, No acute distress. HEENT: [place] AT, moist mucus membranes.  Trachea midline, no masses. Cardiovascular: No clubbing, cyanosis, or edema. Respiratory: Normal respiratory effort, no increased work of breathing. GI: Abdomen is soft, nontender, nondistended, no abdominal masses GU: Prostate is approximately 40 grams, asymmetry present with left flat and right prostate more prominent with minimal increased firmness.  Skin: No rashes, bruises or suspicious lesions. Neurologic: Grossly intact, no focal deficits, moving all 4 extremities. Psychiatric: Normal mood and affect.  Laboratory Data:  Lab Results  Component Value Date   CREATININE 1.14 06/18/2019    Lab Results  Component Value Date   PSA 0.59 05/08/2019   PSA 0.78 05/10/2018   PSA 0.71 03/12/2017    Assessment & Plan:    1.  Asymmetric prostate  Minimal increased firmness and prominence right prostate  With a PSA 0.59 unlikely pathologic  Recommend 80-month follow-up for repeat DRE and PSA  2. BPH with LUTS  Symptoms are stable on tamsulosin and finasteride.    [facility] [address] [name], [place] [address] [phone]  I, Selena Batten, am acting as a scribe for Dr. Nicki Reaper C. Genova Kiner,  I have reviewed the above documentation for accuracy and completeness, and I agree with the above.    Abbie Sons, MD

## 2019-10-13 ENCOUNTER — Encounter: Payer: Self-pay | Admitting: Urology

## 2019-10-13 ENCOUNTER — Ambulatory Visit: Payer: Medicare Other | Admitting: Urology

## 2019-10-13 ENCOUNTER — Other Ambulatory Visit: Payer: Self-pay

## 2019-10-13 VITALS — BP 129/80 | HR 92 | Ht 70.0 in | Wt 233.6 lb

## 2019-10-13 DIAGNOSIS — R3989 Other symptoms and signs involving the genitourinary system: Secondary | ICD-10-CM | POA: Diagnosis not present

## 2019-10-13 DIAGNOSIS — N401 Enlarged prostate with lower urinary tract symptoms: Secondary | ICD-10-CM | POA: Diagnosis not present

## 2019-10-19 ENCOUNTER — Encounter: Payer: Self-pay | Admitting: Family Medicine

## 2019-10-19 DIAGNOSIS — M545 Low back pain, unspecified: Secondary | ICD-10-CM | POA: Insufficient documentation

## 2019-10-19 DIAGNOSIS — R194 Change in bowel habit: Secondary | ICD-10-CM | POA: Insufficient documentation

## 2019-10-19 DIAGNOSIS — R21 Rash and other nonspecific skin eruption: Secondary | ICD-10-CM | POA: Insufficient documentation

## 2019-10-19 NOTE — Assessment & Plan Note (Signed)
Normal PSA however ?induration to R lobe - urology referral placed.

## 2019-10-19 NOTE — Assessment & Plan Note (Deleted)
So far unrevealing evaluation. Continue zetia, aspirin, high dose simvastatin.

## 2019-10-19 NOTE — Assessment & Plan Note (Addendum)
Noticing daily change to bowels for the past 2 weeks with stool urgency associated with difficult to clean bowel movements and some watery leaking throughout the day. Exam with raw skin perianally but no obvious hemorrhoid or fissure. Will refer to GI for further evaluation. He is UTD on colonoscopy.

## 2019-10-19 NOTE — Assessment & Plan Note (Addendum)
L vision loss persists. Unrevealing workup. Continues aspirin, zetia, high dose simvastatin.

## 2019-10-19 NOTE — Assessment & Plan Note (Signed)
Rawness to perianal skin likely from recent looser difficult to clean stools - rec clotrimazole + barrier cream. Update if not improving.

## 2019-10-19 NOTE — Assessment & Plan Note (Addendum)
Anticipate lumbar strain, does have prior h/o R sciatica.  No red flags on exam.  Supportive care reviewed - rec ice, heating pad, tylenol. Update if not improving with treatment.

## 2019-11-03 ENCOUNTER — Ambulatory Visit (INDEPENDENT_AMBULATORY_CARE_PROVIDER_SITE_OTHER): Payer: Medicare Other | Admitting: *Deleted

## 2019-11-03 DIAGNOSIS — Z20822 Contact with and (suspected) exposure to covid-19: Secondary | ICD-10-CM | POA: Diagnosis not present

## 2019-11-03 DIAGNOSIS — I63449 Cerebral infarction due to embolism of unspecified cerebellar artery: Secondary | ICD-10-CM | POA: Diagnosis not present

## 2019-11-04 LAB — CUP PACEART REMOTE DEVICE CHECK
Date Time Interrogation Session: 20210827230552
Implantable Pulse Generator Implant Date: 20210414

## 2019-11-05 NOTE — Progress Notes (Signed)
[facility] Summary Report / Loop Recorder 

## 2019-12-08 ENCOUNTER — Ambulatory Visit (INDEPENDENT_AMBULATORY_CARE_PROVIDER_SITE_OTHER): Payer: Medicare Other

## 2019-12-08 DIAGNOSIS — I63449 Cerebral infarction due to embolism of unspecified cerebellar artery: Secondary | ICD-10-CM | POA: Diagnosis not present

## 2019-12-08 LAB — CUP PACEART REMOTE DEVICE CHECK
Date Time Interrogation Session: 20210929230503
Implantable Pulse Generator Implant Date: 20210414

## 2019-12-10 NOTE — Progress Notes (Signed)
[facility] Summary Report / Loop Recorder 

## 2019-12-17 ENCOUNTER — Telehealth: Payer: Self-pay

## 2019-12-17 NOTE — Telephone Encounter (Signed)
Receving frequent false alerts for AF. Reprogrammed ILR device settings to "Less sensitive" per Device Clinic Protocol.

## 2019-12-25 ENCOUNTER — Encounter (INDEPENDENT_AMBULATORY_CARE_PROVIDER_SITE_OTHER): Payer: Medicare Other | Admitting: Ophthalmology

## 2020-01-07 ENCOUNTER — Other Ambulatory Visit: Payer: Self-pay | Admitting: Family Medicine

## 2020-01-07 DIAGNOSIS — N401 Enlarged prostate with lower urinary tract symptoms: Secondary | ICD-10-CM

## 2020-01-08 ENCOUNTER — Other Ambulatory Visit: Payer: Self-pay

## 2020-01-08 ENCOUNTER — Other Ambulatory Visit: Payer: Medicare Other

## 2020-01-08 DIAGNOSIS — N401 Enlarged prostate with lower urinary tract symptoms: Secondary | ICD-10-CM

## 2020-01-09 LAB — CUP PACEART REMOTE DEVICE CHECK
Date Time Interrogation Session: 20211101230352
Implantable Pulse Generator Implant Date: 20210414

## 2020-01-09 LAB — PSA: Prostate Specific Ag, Serum: 0.7 ng/mL (ref 0.0–4.0)

## 2020-01-12 ENCOUNTER — Ambulatory Visit (INDEPENDENT_AMBULATORY_CARE_PROVIDER_SITE_OTHER): Payer: Medicare Other

## 2020-01-12 DIAGNOSIS — I63449 Cerebral infarction due to embolism of unspecified cerebellar artery: Secondary | ICD-10-CM

## 2020-01-13 ENCOUNTER — Other Ambulatory Visit: Payer: Self-pay

## 2020-01-13 ENCOUNTER — Ambulatory Visit (INDEPENDENT_AMBULATORY_CARE_PROVIDER_SITE_OTHER): Payer: Medicare Other | Admitting: Ophthalmology

## 2020-01-13 ENCOUNTER — Encounter (INDEPENDENT_AMBULATORY_CARE_PROVIDER_SITE_OTHER): Payer: Self-pay | Admitting: Ophthalmology

## 2020-01-13 DIAGNOSIS — H3412 Central retinal artery occlusion, left eye: Secondary | ICD-10-CM

## 2020-01-13 DIAGNOSIS — H43813 Vitreous degeneration, bilateral: Secondary | ICD-10-CM | POA: Diagnosis not present

## 2020-01-13 NOTE — Assessment & Plan Note (Signed)
This condition resolved into, a collateralized optic nerve, no signs of complication such as retinal neovascularization

## 2020-01-13 NOTE — Assessment & Plan Note (Signed)

## 2020-01-13 NOTE — Progress Notes (Signed)
[facility] Summary Report / Loop Recorder 

## 2020-01-13 NOTE — Progress Notes (Signed)
01/13/2020     CHIEF COMPLAINT Patient presents for Retina Follow Up   HISTORY OF PRESENT ILLNESS: Alec Maldonado is a 69 y.o. male who presents to the clinic today for:   HPI    Retina Follow Up    Diagnosis: Central retinal artery occlusion .  In left eye.  Severity is moderate.  Duration of 4 months.  Since onset it is stable.  I, the attending physician,  performed the HPI with the patient and updated documentation appropriately.          Comments    4 Month f\u OU. FP  Pt states vision is the same since last visit. Denies new complaints.       Last edited by Tilda Franco on 01/13/2020  1:36 PM. (History)      Referring physician: Ria Bush, MD [place],  [place] [id number]  HISTORICAL INFORMATION:   Selected notes from the MEDICAL RECORD NUMBER    Lab Results  Component Value Date   HGBA1C 5.7 (H) 06/17/2019     CURRENT MEDICATIONS: No current outpatient medications on file. (Ophthalmic Drugs)   No current facility-administered medications for this visit. (Ophthalmic Drugs)   Current Outpatient Medications (Other)  Medication Sig  . aspirin 81 MG tablet Take 81 mg by mouth daily.  . butalbital-acetaminophen-caffeine (FIORICET, ESGIC) 50-325-40 MG tablet Take 1 tablet by mouth every 6 (six) hours as needed. (Patient taking differently: Take 1 tablet by mouth every 6 (six) hours as needed for headache or migraine. )  . carvedilol (COREG) 6.25 MG tablet Take 1 tablet (6.25 mg total) by mouth 2 (two) times daily with a meal.  . diclofenac Sodium (VOLTAREN) 1 % GEL Apply 2 g topically as needed (knee pain).   Marland Kitchen ezetimibe (ZETIA) 10 MG tablet TAKE 1 TABLET(10 MG) BY MOUTH DAILY (Patient taking differently: Take 10 mg by mouth daily. TAKE 1 TABLET(10 MG) BY MOUTH DAILY)  . finasteride (PROSCAR) 5 MG tablet Take 1 tablet (5 mg total) by mouth daily.  Marland Kitchen ibuprofen (ADVIL) 200 MG tablet Take 400 mg by mouth every 6 (six) hours as needed for  moderate pain.  . Omega-3 Fatty Acids (FISH OIL) 1200 MG CAPS Take 1,200 mg by mouth daily. Reported on 02/23/2015  . simvastatin (ZOCOR) 80 MG tablet TAKE 1 TABLET(80 MG) BY MOUTH DAILY (Patient taking differently: Take 80 mg by mouth daily at 6 PM. TAKE 1 TABLET(80 MG) BY MOUTH DAILY)  . tamsulosin (FLOMAX) 0.4 MG CAPS capsule Take 1 capsule (0.4 mg total) by mouth at bedtime.   No current facility-administered medications for this visit. (Other)      REVIEW OF SYSTEMS:    ALLERGIES No Known Allergies  PAST MEDICAL HISTORY Past Medical History:  Diagnosis Date  . Allergy   . Aortic insufficiency    Mild, echo, January, 2014  . BCC (basal cell carcinoma), back 2017   s/p excisional biospy by derm  . BPH (benign prostatic hypertrophy)   . Chest discomfort    nuclear 06/24/09, suggestion prior small inferior MI with moderate peri-infarvt ischemia versus variable diaphragmatic attenuation  . Colon polyps    colonoscopy 07/02/06, repeat in 10 years  . Dyslipidemia   . Ejection fraction    EF 55%, echo, October, 2010  //   EF 45-50%,  catheterization, 2011  . Heart murmur   . Hyperlipidemia   . Migraines   . Mitral regurgitation    Mitral valve repair  2001  . Neurofibroma of foot 04/2017   s/p removal by derm  . Palpitations 07/2009    helped with carvedilol   . Pleurisy   . Postural dizziness    mild, no orthostatic changes by blood pressure check  . Pre-syncope    January, 2014  . RBBB    First seen January, 2014  . Right ventricular dysfunction    Mild/moderate right ventricular dysfunction by echo, January, 2014, no tricuspid regurgitation, therefore right heart pressure could not be estimated.  . Shortness of breath   . Status post mitral valve repair    2001,  //  echo, October, 2010, good function of the repaired mitral valve  . Stroke ([place])   . SVT (supraventricular tachycardia) ([place]) 03/11/2013   Isolated episodes - last 03/2013 after flu treated with adenosine  cardioversion in ER.   Marland Kitchen Thyroid disease    Past Surgical History:  Procedure Laterality Date  . aneurysmal dilatation     proximal LAD cath 2001, reoeat catheter 2004, no significant change  . aneurysmal dilatiation  07/15/09   no change, normal right heart filling pressures and LVEDP- EF 45-50%  . APPENDECTOMY  1970  . BUBBLE STUDY  06/18/2019   Procedure: BUBBLE STUDY;  Surgeon: Sueanne Margarita, MD;  Location: [facility] ENDOSCOPY;  Service: Cardiovascular;;  . CARDIAC CATHETERIZATION  2010   mild global LV dysfunction EF 45-50%, no significant CAD obstruction, aneurysmal dilatation LAD  . COLONOSCOPY  2008  . COLONOSCOPY  06/2016   diverticulosis, rpt 10 yrs Ardis Hughs)  . CORONARY ANGIOPLASTY  09/1999   severe MR o/w normal limits  . CORONARY ANGIOPLASTY  12/19/02   pos. CAD, mild LV dysfunction- med treatment  . EYE SURGERY Left   . HERNIA REPAIR  01/11/05   left, Dr. Hassell Done  . I & D EXTREMITY Right 02/04/2015   Procedure: IRRIGATION AND DEBRIDEMENT RIGHT INDEX FINGER OPEN FRACTURE AND REPAIR DIGITAL NERVE AND ARTERY;  Surgeon: Leanora Cover, MD;  Location: [facility];  Service: Orthopedics;  Laterality: Right;  . LOOP RECORDER INSERTION N/A 06/18/2019   Procedure: LOOP RECORDER INSERTION;  Surgeon: Deboraha Sprang, MD;  Location: [place] CV LAB;  Service: Cardiovascular;  Laterality: N/A;  . MITRAL VALVE REPAIR  01/22/00   good mitral valve function- echo 12/2008- EF 55% / nuclear 06/2009- EF 36%,? reliable? / EF 45-50%, cath rated 02/2010  . ORIF ELBOW FRACTURE     left  . POLYPECTOMY    . TEE WITHOUT CARDIOVERSION N/A 06/18/2019   Procedure: TRANSESOPHAGEAL ECHOCARDIOGRAM (TEE);  Surgeon: Sueanne Margarita, MD;  Location: [facility] ENDOSCOPY;  Service: Cardiovascular;  Laterality: N/A;  . THYROID SURGERY    . THYROIDECTOMY     left, due to goiter 1994    FAMILY HISTORY Family History  Problem Relation Age of Onset  . Alzheimer's disease Mother   . Heart disease Mother        CHF  . Arthritis  Father   . Heart disease Father        leaking mitral valve  . Benign prostatic hyperplasia Father   . Heart disease Brother        murmur  . Hypertension Neg Hx   . Diabetes Neg Hx   . Depression Neg Hx   . Alcohol abuse Neg Hx   . Drug abuse Neg Hx   . Stroke Neg Hx   . Cancer Neg Hx   . Colon cancer Neg Hx   . Esophageal cancer  Neg Hx   . Rectal cancer Neg Hx   . Stomach cancer Neg Hx     SOCIAL HISTORY Social History   Tobacco Use  . Smoking status: Never Smoker  . Smokeless tobacco: Never Used  Vaping Use  . Vaping Use: Never used  Substance Use Topics  . Alcohol use: No  . Drug use: No         OPHTHALMIC EXAM:  Base Eye Exam    Visual Acuity (Snellen - Linear)      Right Left   Dist [place] 20/25 + CF @ >1'   Dist ph [place]  NI       Tonometry (Tonopen, 1:40 PM)      Right Left   Pressure 12 12       Pupils      Pupils Dark Light Shape React APD   Right PERRL 4 3 Round Brisk None   Left PERRL 4 3 Round Brisk +1       Visual Fields (Counting fingers)      Left Right     Full   Restrictions Total superior temporal, inferior temporal, inferior nasal deficiencies        Neuro/Psych    Oriented x3: Yes   Mood/Affect: Normal       Dilation    Both eyes: 1.0% Mydriacyl, 2.5% Phenylephrine @ 1:40 PM        Slit Lamp and Fundus Exam    External Exam      Right Left   External Normal Normal       Slit Lamp Exam      Right Left   Lids/Lashes Normal Normal   Conjunctiva/Sclera White and quiet White and quiet   Cornea Clear Clear   Anterior Chamber Deep and quiet Deep and quiet   Iris Round and reactive Round and reactive, sluggish   Lens Posterior chamber intraocular lens Posterior chamber intraocular lens   Anterior Vitreous Normal Normal       Fundus Exam      Right Left   Posterior Vitreous Central vitreous floaters Central vitreous floaters   Disc Peripapillary atrophy Peripapillary atrophy,,, 2+ Optic disc atrophy, 3+ Pallor,.  No  neovascularization, collateralization of the optic nerve to the choroidal circulation nasally and superiorly   C/D Ratio 0.2-.3 0.4   Macula Normal Diffuse macular atrophy   Vessels Normal Arteriolar narrowing,, attenuated particularly in the macula.   Periphery Normal Normal          IMAGING AND PROCEDURES  Imaging and Procedures for 01/13/20  Color Fundus Photography Optos - OU - Both Eyes       Right Eye Progression has been stable. Disc findings include normal observations. Macula : normal observations. Vessels : normal observations. Periphery : normal observations.   Left Eye Progression has worsened. Disc findings include increased cup to disc ratio, pallor. Macula : retinal pigment epithelium abnormalities.   Notes Peripapillary atrophy OD, and OS  Retinal vasculature right eye is entirely normal  Left eye with attenuated retinal artery status post central retinal artery Yet with documented collateralization of vessels on the optic nerve, reaching into the peripapillary choroid                ASSESSMENT/PLAN:  Central retinal artery occlusion of left eye This condition resolved into, a collateralized optic nerve, no signs of complication such as retinal neovascularization  Posterior vitreous detachment, both eyes   The nature of posterior vitreous detachment was discussed with the  patient as well as its physiology, its age prevalence, and its possible implication regarding retinal breaks and detachment.  An informational brochure was given to the patient.  All the patient's questions were answered.  The patient was asked to return if new or different flashes or floaters develops.   Patient was instructed to contact office immediately if any changes were noticed. I explained to the patient that vitreous inside the eye is similar to jello inside a bowl. As the jello melts it can start to pull away from the bowl, similarly the vitreous throughout our lives can begin  to pull away from the retina. That process is called a posterior vitreous detachment. In some cases, the vitreous can tug hard enough on the retina to form a retinal tear. I discussed with the patient the signs and symptoms of a retinal detachment.  Do not rub the eye.      ICD-10-CM   1. Central retinal artery occlusion of left eye  H34.12 Color Fundus Photography Optos - OU - Both Eyes  2. Posterior vitreous detachment, both eyes  H43.813     1.  No complications of the central retinal artery occlusion left eye seen today  2.  Patient informed that should amaurosis fugax, darkening or graying out of the vision in the right eye occur for more than 30 seconds, the patient should seek medical assistance emergently, through the stroke unit at a local emergency room  3.  Ophthalmic Meds Ordered this visit:  No orders of the defined types were placed in this encounter.      Return in about 6 months (around 07/12/2020) for DILATE OU, COLOR FP.  There are no Patient Instructions on file for this visit.   Explained the diagnoses, plan, and follow up with the patient and they expressed understanding.  Patient expressed understanding of the importance of proper follow up care.   Clent Demark Amarisa Wilinski M.D. Diseases & Surgery of the Retina and Vitreous Retina & Diabetic [place] 01/13/20     Abbreviations: M myopia (nearsighted); A astigmatism; H hyperopia (farsighted); P presbyopia; Mrx spectacle prescription;  CTL contact lenses; OD right eye; OS left eye; OU both eyes  XT exotropia; ET esotropia; PEK punctate epithelial keratitis; PEE punctate epithelial erosions; DES dry eye syndrome; MGD meibomian gland dysfunction; ATs artificial tears; PFAT's preservative free artificial tears; [place] nuclear sclerotic cataract; PSC posterior subcapsular cataract; ERM epi-retinal membrane; PVD posterior vitreous detachment; RD retinal detachment; DM diabetes mellitus; DR diabetic retinopathy; NPDR  non-proliferative diabetic retinopathy; PDR proliferative diabetic retinopathy; CSME clinically significant macular edema; DME diabetic macular edema; dbh dot blot hemorrhages; CWS cotton wool spot; POAG primary open angle glaucoma; C/D cup-to-disc ratio; HVF humphrey visual field; GVF goldmann visual field; OCT optical coherence tomography; IOP intraocular pressure; BRVO Branch retinal vein occlusion; CRVO central retinal vein occlusion; CRAO central retinal artery occlusion; BRAO branch retinal artery occlusion; RT retinal tear; SB scleral buckle; PPV pars plana vitrectomy; VH Vitreous hemorrhage; PRP panretinal laser photocoagulation; IVK intravitreal kenalog; VMT vitreomacular traction; MH Macular hole;  NVD neovascularization of the disc; NVE neovascularization elsewhere; AREDS age related eye disease study; ARMD age related macular degeneration; POAG primary open angle glaucoma; EBMD epithelial/anterior basement membrane dystrophy; ACIOL anterior chamber intraocular lens; IOL intraocular lens; PCIOL posterior chamber intraocular lens; Phaco/IOL phacoemulsification with intraocular lens placement; [place] photorefractive keratectomy; LASIK laser assisted in situ keratomileusis; HTN hypertension; DM diabetes mellitus; COPD chronic obstructive pulmonary disease

## 2020-01-14 ENCOUNTER — Ambulatory Visit: Payer: Self-pay | Admitting: Urology

## 2020-01-19 ENCOUNTER — Ambulatory Visit (INDEPENDENT_AMBULATORY_CARE_PROVIDER_SITE_OTHER): Payer: Medicare Other | Admitting: Urology

## 2020-01-19 ENCOUNTER — Encounter: Payer: Self-pay | Admitting: Urology

## 2020-01-19 ENCOUNTER — Other Ambulatory Visit: Payer: Self-pay

## 2020-01-19 VITALS — BP 144/78 | HR 74 | Ht 71.0 in | Wt 235.0 lb

## 2020-01-19 DIAGNOSIS — N401 Enlarged prostate with lower urinary tract symptoms: Secondary | ICD-10-CM | POA: Diagnosis not present

## 2020-01-19 NOTE — Progress Notes (Signed)
01/19/2020 3:14 PM   Alec Maldonado February 10, 1951 [id number]  Referring provider: Ria Bush, MD [address] [place],  [place] [address]  Chief Complaint  Patient presents with  . Elevated PSA    HPI: 69 y.o. male initially seen 10/13/2019 for abnormal DRE.   Exam remarkable for asymmetric prostate with minimal increased firmness and prominence of the right prostate  PSA was 0.59  No complaints today; no bothersome LUTS  Follow-up PSA 01/08/2020 stable at 0.7  On tamsulosin/finasteride   PMH: Past Medical History:  Diagnosis Date  . Allergy   . Aortic insufficiency    Mild, echo, January, 2014  . BCC (basal cell carcinoma), back 2017   s/p excisional biospy by derm  . BPH (benign prostatic hypertrophy)   . Chest discomfort    nuclear 06/24/09, suggestion prior small inferior MI with moderate peri-infarvt ischemia versus variable diaphragmatic attenuation  . Colon polyps    colonoscopy 07/02/06, repeat in 10 years  . Dyslipidemia   . Ejection fraction    EF 55%, echo, October, 2010  //   EF 45-50%,  catheterization, 2011  . Heart murmur   . Hyperlipidemia   . Migraines   . Mitral regurgitation    Mitral valve repair 2001  . Neurofibroma of foot 04/2017   s/p removal by derm  . Palpitations 07/2009    helped with carvedilol   . Pleurisy   . Postural dizziness    mild, no orthostatic changes by blood pressure check  . Pre-syncope    January, 2014  . RBBB    First seen January, 2014  . Right ventricular dysfunction    Mild/moderate right ventricular dysfunction by echo, January, 2014, no tricuspid regurgitation, therefore right heart pressure could not be estimated.  . Shortness of breath   . Status post mitral valve repair    2001,  //  echo, October, 2010, good function of the repaired mitral valve  . Stroke ([place])   . SVT (supraventricular tachycardia) ([place]) 03/11/2013   Isolated episodes - last 03/2013 after flu treated with adenosine  cardioversion in ER.   Marland Kitchen Thyroid disease     Surgical History: Past Surgical History:  Procedure Laterality Date  . aneurysmal dilatation     proximal LAD cath 2001, reoeat catheter 2004, no significant change  . aneurysmal dilatiation  07/15/09   no change, normal right heart filling pressures and LVEDP- EF 45-50%  . APPENDECTOMY  1970  . BUBBLE STUDY  06/18/2019   Procedure: BUBBLE STUDY;  Surgeon: Sueanne Margarita, MD;  Location: [facility] ENDOSCOPY;  Service: Cardiovascular;;  . CARDIAC CATHETERIZATION  2010   mild global LV dysfunction EF 45-50%, no significant CAD obstruction, aneurysmal dilatation LAD  . COLONOSCOPY  2008  . COLONOSCOPY  06/2016   diverticulosis, rpt 10 yrs Ardis Hughs)  . CORONARY ANGIOPLASTY  09/1999   severe MR o/w normal limits  . CORONARY ANGIOPLASTY  12/19/02   pos. CAD, mild LV dysfunction- med treatment  . EYE SURGERY Left   . HERNIA REPAIR  01/11/05   left, Dr. Hassell Done  . I & D EXTREMITY Right 02/04/2015   Procedure: IRRIGATION AND DEBRIDEMENT RIGHT INDEX FINGER OPEN FRACTURE AND REPAIR DIGITAL NERVE AND ARTERY;  Surgeon: Leanora Cover, MD;  Location: [facility];  Service: Orthopedics;  Laterality: Right;  . LOOP RECORDER INSERTION N/A 06/18/2019   Procedure: LOOP RECORDER INSERTION;  Surgeon: Deboraha Sprang, MD;  Location: [facility] CV LAB;  Service: Cardiovascular;  Laterality:  N/A;  . MITRAL VALVE REPAIR  01/22/00   good mitral valve function- echo 12/2008- EF 55% / nuclear 06/2009- EF 36%,? reliable? / EF 45-50%, cath rated 02/2010  . ORIF ELBOW FRACTURE     left  . POLYPECTOMY    . TEE WITHOUT CARDIOVERSION N/A 06/18/2019   Procedure: TRANSESOPHAGEAL ECHOCARDIOGRAM (TEE);  Surgeon: Sueanne Margarita, MD;  Location: [facility] ENDOSCOPY;  Service: Cardiovascular;  Laterality: N/A;  . THYROID SURGERY    . THYROIDECTOMY     left, due to goiter 1994    Home Medications:  Allergies as of 01/19/2020   No Known Allergies     Medication List       Accurate as of January 19, 2020  3:14 PM. If you have any questions, ask your nurse or doctor.        aspirin 81 MG tablet Take 81 mg by mouth daily.   butalbital-acetaminophen-caffeine 50-325-40 MG tablet Commonly known as: FIORICET Take 1 tablet by mouth every 6 (six) hours as needed. What changed: reasons to take this   carvedilol 6.25 MG tablet Commonly known as: COREG Take 1 tablet (6.25 mg total) by mouth 2 (two) times daily with a meal.   ezetimibe 10 MG tablet Commonly known as: ZETIA TAKE 1 TABLET(10 MG) BY MOUTH DAILY What changed:   how much to take  how to take this  when to take this   finasteride 5 MG tablet Commonly known as: PROSCAR Take 1 tablet (5 mg total) by mouth daily.   Fish Oil 1200 MG Caps Take 1,200 mg by mouth daily. Reported on 02/23/2015   ibuprofen 200 MG tablet Commonly known as: ADVIL Take 400 mg by mouth every 6 (six) hours as needed for moderate pain.   simvastatin 80 MG tablet Commonly known as: ZOCOR TAKE 1 TABLET(80 MG) BY MOUTH DAILY What changed:   how much to take  how to take this  when to take this   tamsulosin 0.4 MG Caps capsule Commonly known as: FLOMAX Take 1 capsule (0.4 mg total) by mouth at bedtime.   Voltaren 1 % Gel Generic drug: diclofenac Sodium Apply 2 g topically as needed (knee pain).       Allergies: No Known Allergies  Family History: Family History  Problem Relation Age of Onset  . Alzheimer's disease Mother   . Heart disease Mother        CHF  . Arthritis Father   . Heart disease Father        leaking mitral valve  . Benign prostatic hyperplasia Father   . Heart disease Brother        murmur  . Hypertension Neg Hx   . Diabetes Neg Hx   . Depression Neg Hx   . Alcohol abuse Neg Hx   . Drug abuse Neg Hx   . Stroke Neg Hx   . Cancer Neg Hx   . Colon cancer Neg Hx   . Esophageal cancer Neg Hx   . Rectal cancer Neg Hx   . Stomach cancer Neg Hx     Social History:  reports that he has never smoked.  He has never used smokeless tobacco. He reports that he does not drink alcohol and does not use drugs.   Physical Exam: BP (!) 144/78   Pulse 74   Ht 5\' 11"  (1.803 m)   Wt 235 lb (106.6 kg)   BMI 32.78 kg/m   Constitutional:  Alert and oriented, No acute distress.  HEENT: [place] AT, moist mucus membranes.  Trachea midline, no masses. Cardiovascular: No clubbing, cyanosis, or edema. Respiratory: Normal respiratory effort, no increased work of breathing. GU: Prostate 40 g; the previously noted prominence and minimal increased firmness of the right prostate has resolved Skin: No rashes, bruises or suspicious lesions. Neurologic: Grossly intact, no focal deficits, moving all 4 extremities. Psychiatric: Normal mood and affect.   Assessment & Plan:     No significant abnormalities noted on today's DRE and PSA remains stable and low  Continue annual follow-up with PCP  We did discuss current prostate cancer screening guidelines PSA and DRE between the ages of 3-69 and that in healthy patients this can be pushed to age 54.    Abbie Sons, [facility] [address], [place], [place] [address] [phone]

## 2020-02-11 ENCOUNTER — Other Ambulatory Visit: Payer: Self-pay

## 2020-02-11 ENCOUNTER — Encounter: Payer: Self-pay | Admitting: Orthopaedic Surgery

## 2020-02-11 ENCOUNTER — Ambulatory Visit: Payer: Self-pay

## 2020-02-11 ENCOUNTER — Ambulatory Visit: Payer: Medicare Other | Admitting: Orthopaedic Surgery

## 2020-02-11 VITALS — Ht 71.0 in | Wt 235.0 lb

## 2020-02-11 DIAGNOSIS — M25561 Pain in right knee: Secondary | ICD-10-CM | POA: Diagnosis not present

## 2020-02-11 DIAGNOSIS — M170 Bilateral primary osteoarthritis of knee: Secondary | ICD-10-CM | POA: Insufficient documentation

## 2020-02-11 DIAGNOSIS — M25562 Pain in left knee: Secondary | ICD-10-CM | POA: Diagnosis not present

## 2020-02-11 DIAGNOSIS — G8929 Other chronic pain: Secondary | ICD-10-CM

## 2020-02-11 DIAGNOSIS — M1712 Unilateral primary osteoarthritis, left knee: Secondary | ICD-10-CM

## 2020-02-11 MED ORDER — METHYLPREDNISOLONE ACETATE 40 MG/ML IJ SUSP
80.0000 mg | INTRAMUSCULAR | Status: AC | PRN
  Administered 2020-02-11: 80 mg via INTRA_ARTICULAR

## 2020-02-11 MED ORDER — BUPIVACAINE HCL 0.5 % IJ SOLN
2.0000 mL | INTRAMUSCULAR | Status: AC | PRN
  Administered 2020-02-11: 2 mL via INTRA_ARTICULAR

## 2020-02-11 MED ORDER — LIDOCAINE HCL 1 % IJ SOLN
2.0000 mL | INTRAMUSCULAR | Status: AC | PRN
  Administered 2020-02-11: 2 mL

## 2020-02-11 NOTE — Progress Notes (Signed)
Office Visit Note   Patient: Alec Maldonado           Date of Birth: [birth year]           MRN: [id number] Visit Date: 02/11/2020              Requested by: Ria Bush, MD [address] [place],  [place] [address] PCP: Ria Bush, MD   Assessment & Plan: Visit Diagnoses:  1. Chronic pain of both knees   2. Primary osteoarthritis of left knee   3. Bilateral primary osteoarthritis of knee     Plan: Alec Maldonado has evidence of osteoarthritis of both knees.  He has had a prior left knee arthroscopy demonstrating degenerative changes in addition to the meniscal tear.  He has had an exacerbation of of the arthritis.  Films today demonstrate predominately medial compartment degenerative changes.  Will inject both knees with cortisone and monitor his response  Follow-Up Instructions: Return if symptoms worsen or fail to improve.   Orders:  Orders Placed This Encounter  Procedures  . Large Joint Inj: bilateral knee  . XR KNEE 3 VIEW RIGHT  . XR KNEE 3 VIEW LEFT   No orders of the defined types were placed in this encounter.     Procedures: Large Joint Inj: bilateral knee on 02/11/2020 10:44 AM Indications: diagnostic evaluation Details: 25 G 1.5 in needle, anteromedial approach  Arthrogram: No  Medications (Right): 2 mL lidocaine 1 %; 2 mL bupivacaine 0.5 %; 80 mg methylPREDNISolone acetate 40 MG/ML Medications (Left): 2 mL lidocaine 1 %; 2 mL bupivacaine 0.5 %; 80 mg methylPREDNISolone acetate 40 MG/ML Consent was given by the patient. Immediately prior to procedure a time out was called to verify the correct patient, procedure, equipment, support staff and site/side marked as required. Patient was prepped and draped in the usual sterile fashion.       Clinical Data: No additional findings.   Subjective: Chief Complaint  Patient presents with  . Right Knee - Pain  . Left Knee - Pain  Patient presents today for bilateral knee pain. He said that they  started months ago, with no injury. The right knee is worse than the left. His pain is located medially in both knees. No swelling, grinding, or giving way. The more activity he does, the more they hurt. Rest helps. He has been taking Advil and states that it helps some.   HPI  Review of Systems   Objective: Vital Signs: Ht 5\' 11"  (1.803 m)   Wt 235 lb (106.6 kg)   BMI 32.78 kg/m   Physical Exam Constitutional:      Appearance: He is well-developed.  Eyes:     Pupils: Pupils are equal, round, and reactive to light.  Pulmonary:     Effort: Pulmonary effort is normal.  Skin:    General: Skin is warm and dry.  Neurological:     Mental Status: He is alert and oriented to person, place, and time.  Psychiatric:        Behavior: Behavior normal.     Ortho Exam awake alert and oriented x3.  Comfortable sitting.  No obvious effusion of either knee.  Slight varus.  Full extension and flexed over 100 degrees without instability.  Predominately medial joint pain both knees.  Some patella crepitation but no pain with patella compression.  No popliteal pain or mass.  Straight leg raise negative.  Painless range of motion both hips Specialty Comments:  No specialty  comments available.  Imaging: XR KNEE 3 VIEW LEFT  Result Date: 02/11/2020 Films of the left knee were obtained in 3 projections standing.  There is more decrease in the medial joint space compared to the right knee and slight varus.  Lateral patella tilt with some changes of arthritis of the patellofemoral joint as well.  No significant lateral joint degenerative changes.  Films are consistent with moderate osteoarthritis without acute change or ectopic calcification  XR KNEE 3 VIEW RIGHT  Result Date: 02/11/2020 Films of the right knee were obtained in 3 projections standing.  There is some decrease in the medial joint space with slight varus consistent with osteoarthritis.  There is some faint calcification within the meniscus  that might be consistent with CPPD.  On the patella film there is a lateral patellar tilt and some very small osteophytes.  Lateral compartment appeared to be clear.  Films are consistent with moderate osteoarthritis without acute change    PMFS History: Patient Active Problem List   Diagnosis Date Noted  . Bilateral primary osteoarthritis of knee 02/11/2020  . Posterior vitreous detachment, both eyes 01/13/2020  . Acute right-sided low back pain 10/19/2019  . Bowel habit changes 10/19/2019  . Perianal rash 10/19/2019  . LVH (left ventricular hypertrophy) 08/10/2019  . Central retinal artery occlusion of left eye 06/16/2019  . History 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 06/16/2019  . Obesity 06/16/2019  . Abnormal prostate by palpation 05/13/2019  . Primary osteoarthritis of left knee 05/06/2019  . Sciatica, right side 05/06/2019  . Pain in left knee 01/21/2019  . Medicare annual wellness visit, subsequent 02/23/2015  . Advanced care planning/counseling discussion 02/23/2015  . Health maintenance examination 08/28/2013  . Paroxysmal SVT (supraventricular tachycardia) ([place]) 03/11/2013  . Dysplastic nevus of trunk 08/20/2012  . Aortic insufficiency   . Right ventricular dysfunction   . RBBB   . Status post mitral valve repair   . Chest discomfort   . Mitral regurgitation   . Migraines   . Exertional dyspnea 12/14/2008  . Dyslipidemia 12/13/2008  . Benign prostatic hyperplasia with lower urinary tract symptoms 05/10/2007   Past Medical History:  Diagnosis Date  . Allergy   . Aortic insufficiency    Mild, echo, January, 2014  . BCC (basal cell carcinoma), back 2017   s/p excisional biospy by derm  . BPH (benign prostatic hypertrophy)   . Chest discomfort    nuclear 06/24/09, suggestion prior small inferior MI with moderate peri-infarvt ischemia versus variable diaphragmatic attenuation  . Colon polyps    colonoscopy 07/02/06, repeat  in 10 years  . Dyslipidemia   . Ejection fraction    EF 55%, echo, October, 2010  //   EF 45-50%,  catheterization, 2011  . Heart murmur   . Hyperlipidemia   . Migraines   . Mitral regurgitation    Mitral valve repair 2001  . Neurofibroma of foot 04/2017   s/p removal by derm  . Palpitations 07/2009    helped with carvedilol   . Pleurisy   . Postural dizziness    mild, no orthostatic changes by blood pressure check  . Pre-syncope    January, 2014  . RBBB    First seen January, 2014  . Right ventricular dysfunction    Mild/moderate right ventricular dysfunction by echo, January, 2014, no tricuspid regurgitation, therefore right heart pressure could not be estimated.  . Shortness of breath   . Status post mitral valve repair  2001,  //  echo, October, 2010, good function of the repaired mitral valve  . Stroke ([place])   . SVT (supraventricular tachycardia) ([place]) 03/11/2013   Isolated episodes - last 03/2013 after flu treated with adenosine cardioversion in ER.   Marland Kitchen Thyroid disease     Family History  Problem Relation Age of Onset  . Alzheimer's disease Mother   . Heart disease Mother        CHF  . Arthritis Father   . Heart disease Father        leaking mitral valve  . Benign prostatic hyperplasia Father   . Heart disease Brother        murmur  . Hypertension Neg Hx   . Diabetes Neg Hx   . Depression Neg Hx   . Alcohol abuse Neg Hx   . Drug abuse Neg Hx   . Stroke Neg Hx   . Cancer Neg Hx   . Colon cancer Neg Hx   . Esophageal cancer Neg Hx   . Rectal cancer Neg Hx   . Stomach cancer Neg Hx     Past Surgical History:  Procedure Laterality Date  . aneurysmal dilatation     proximal LAD cath 2001, reoeat catheter 2004, no significant change  . aneurysmal dilatiation  07/15/09   no change, normal right heart filling pressures and LVEDP- EF 45-50%  . APPENDECTOMY  1970  . BUBBLE STUDY  06/18/2019   Procedure: BUBBLE STUDY;  Surgeon: Sueanne Margarita, MD;  Location: [facility]  ENDOSCOPY;  Service: Cardiovascular;;  . CARDIAC CATHETERIZATION  2010   mild global LV dysfunction EF 45-50%, no significant CAD obstruction, aneurysmal dilatation LAD  . COLONOSCOPY  2008  . COLONOSCOPY  06/2016   diverticulosis, rpt 10 yrs Ardis Hughs)  . CORONARY ANGIOPLASTY  09/1999   severe MR o/w normal limits  . CORONARY ANGIOPLASTY  12/19/02   pos. CAD, mild LV dysfunction- med treatment  . EYE SURGERY Left   . HERNIA REPAIR  01/11/05   left, Dr. Hassell Done  . I & D EXTREMITY Right 02/04/2015   Procedure: IRRIGATION AND DEBRIDEMENT RIGHT INDEX FINGER OPEN FRACTURE AND REPAIR DIGITAL NERVE AND ARTERY;  Surgeon: Leanora Cover, MD;  Location: [facility];  Service: Orthopedics;  Laterality: Right;  . LOOP RECORDER INSERTION N/A 06/18/2019   Procedure: LOOP RECORDER INSERTION;  Surgeon: Deboraha Sprang, MD;  Location: [facility] CV LAB;  Service: Cardiovascular;  Laterality: N/A;  . MITRAL VALVE REPAIR  01/22/00   good mitral valve function- echo 12/2008- EF 55% / nuclear 06/2009- EF 36%,? reliable? / EF 45-50%, cath rated 02/2010  . ORIF ELBOW FRACTURE     left  . POLYPECTOMY    . TEE WITHOUT CARDIOVERSION N/A 06/18/2019   Procedure: TRANSESOPHAGEAL ECHOCARDIOGRAM (TEE);  Surgeon: Sueanne Margarita, MD;  Location: [facility] ENDOSCOPY;  Service: Cardiovascular;  Laterality: N/A;  . THYROID SURGERY    . THYROIDECTOMY     left, due to goiter 1994   Social History   Occupational History  . Occupation: Artist  Tobacco Use  . Smoking status: Never Smoker  . Smokeless tobacco: Never Used  Vaping Use  . Vaping Use: Never used  Substance and Sexual Activity  . Alcohol use: No  . Drug use: No  . Sexual activity: Yes

## 2020-02-15 LAB — CUP PACEART REMOTE DEVICE CHECK
Date Time Interrogation Session: 20211204230400
Implantable Pulse Generator Implant Date: 20210414

## 2020-02-16 ENCOUNTER — Ambulatory Visit (INDEPENDENT_AMBULATORY_CARE_PROVIDER_SITE_OTHER): Payer: Medicare Other

## 2020-02-16 DIAGNOSIS — I63449 Cerebral infarction due to embolism of unspecified cerebellar artery: Secondary | ICD-10-CM | POA: Diagnosis not present

## 2020-03-02 NOTE — Progress Notes (Signed)
[facility] Summary Report / Loop Recorder 

## 2020-03-11 ENCOUNTER — Other Ambulatory Visit: Payer: Self-pay

## 2020-03-11 ENCOUNTER — Ambulatory Visit: Payer: Medicare Other | Admitting: Orthopaedic Surgery

## 2020-03-11 ENCOUNTER — Encounter: Payer: Self-pay | Admitting: Orthopaedic Surgery

## 2020-03-11 DIAGNOSIS — M1711 Unilateral primary osteoarthritis, right knee: Secondary | ICD-10-CM | POA: Diagnosis not present

## 2020-03-11 DIAGNOSIS — M170 Bilateral primary osteoarthritis of knee: Secondary | ICD-10-CM

## 2020-03-11 MED ORDER — LIDOCAINE HCL 1 % IJ SOLN
2.0000 mL | INTRAMUSCULAR | Status: AC | PRN
  Administered 2020-03-11: 2 mL

## 2020-03-11 MED ORDER — BUPIVACAINE HCL 0.5 % IJ SOLN
2.0000 mL | INTRAMUSCULAR | Status: AC | PRN
  Administered 2020-03-11: 2 mL via INTRA_ARTICULAR

## 2020-03-11 NOTE — Progress Notes (Signed)
Office Visit Note   Patient: Alec Maldonado           Date of Birth: 04/06/1950           MRN: [id number] Visit Date: 03/11/2020              Requested by: Eustaquio Boyden, MD [address] [place],  [place] [address] PCP: Eustaquio Boyden, MD   Assessment & Plan: Visit Diagnoses:  1. Bilateral primary osteoarthritis of knee     Plan: Alec Maldonado relates that the cortisone injection of both knees made a big difference on the left but minimally on the right.  He had about a week and a half relief only to have a twisting injury to his knee tripping over some objects on his floor with recurrent pain along the medial aspect of his right knee.  He has been able to ambulate without ambulatory aid but has been uncomfortable.  I am going to reinject the right knee with cortisone and monitor his response.  If he is not much better over the next 2 weeks I think it is worth obtaining an MRI scan.  He also would like to have another epidural steroid injection of his lumbar spine if he could have that at the same time of the MRI scan should he need that.  He will let me know Follow-Up Instructions: Return if symptoms worsen or fail to improve.   Orders:  No orders of the defined types were placed in this encounter.  No orders of the defined types were placed in this encounter.     Procedures: Large Joint Inj: R knee on 03/11/2020 1:42 PM Indications: pain and diagnostic evaluation Details: 25 G 1.5 in needle, anteromedial approach  Arthrogram: No  Medications: 2 mL lidocaine 1 %; 2 mL bupivacaine 0.5 %  2 mL betamethasone injected to medial compartment right knee with Marcaine and Xylocaine Procedure, treatment alternatives, risks and benefits explained, specific risks discussed. Consent was given by the patient. Immediately prior to procedure a time out was called to verify the correct patient, procedure, equipment, support staff and site/side marked as required. Patient was prepped and  draped in the usual sterile fashion.       Clinical Data: No additional findings.   Subjective: Chief Complaint  Patient presents with  . Right Knee - Pain  1 month status post injection of both knees with cortisone with evidence of osteoarthritis by plain film.  Right knee has still had some discomfort after a twisting injury.  Doing well on the left side.  Not using any ambulatory aid.  Has tried over-the-counter medicine but still having medial joint pain right knee.  Had films last month demonstrating degenerative changes predominate in the medial compartment of both knees.  There was more narrowing of the medial joint space on the left than the right.  Also lateral patella tilt.  No ectopic calcification HPI  Review of Systems   Objective: Vital Signs: There were no vitals taken for this visit.  Physical Exam Constitutional:      Appearance: He is well-developed and well-nourished.  HENT:     Mouth/Throat:     Mouth: Oropharynx is clear and moist.  Eyes:     Extraocular Movements: EOM normal.     Pupils: Pupils are equal, round, and reactive to light.  Pulmonary:     Effort: Pulmonary effort is normal.  Skin:    General: Skin is warm and dry.  Neurological:  Mental Status: He is alert and oriented to person, place, and time.  Psychiatric:        Mood and Affect: Mood and affect normal.        Behavior: Behavior normal.     Ortho Exam awake and alert.  Comfortable.  No acute distress.  Right knee with a very small effusion.  Increased varus with weightbearing.  Predominant medial joint pain but no popping or clicking.  No patella discomfort with compression.  No lateral joint pain.  Full extension flexed over 105 degrees without instability.  No calf pain no distal edema  Specialty Comments:  No specialty comments available.  Imaging: No results found.   PMFS History: Patient Active Problem List   Diagnosis Date Noted  . Bilateral primary osteoarthritis  of knee 02/11/2020  . Posterior vitreous detachment, both eyes 01/13/2020  . Acute right-sided low back pain 10/19/2019  . Bowel habit changes 10/19/2019  . Perianal rash 10/19/2019  . LVH (left ventricular hypertrophy) 08/10/2019  . Central retinal artery occlusion of left eye 06/16/2019  . History 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 06/16/2019  . Obesity 06/16/2019  . Abnormal prostate by palpation 05/13/2019  . Primary osteoarthritis of left knee 05/06/2019  . Sciatica, right side 05/06/2019  . Pain in left knee 01/21/2019  . Medicare annual wellness visit, subsequent 02/23/2015  . Advanced care planning/counseling discussion 02/23/2015  . Health maintenance examination 08/28/2013  . Paroxysmal SVT (supraventricular tachycardia) ([place]) 03/11/2013  . Dysplastic nevus of trunk 08/20/2012  . Aortic insufficiency   . Right ventricular dysfunction   . RBBB   . Status post mitral valve repair   . Chest discomfort   . Mitral regurgitation   . Migraines   . Exertional dyspnea 12/14/2008  . Dyslipidemia 12/13/2008  . Benign prostatic hyperplasia with lower urinary tract symptoms 05/10/2007   Past Medical History:  Diagnosis Date  . Allergy   . Aortic insufficiency    Mild, echo, January, 2014  . BCC (basal cell carcinoma), back 2017   s/p excisional biospy by derm  . BPH (benign prostatic hypertrophy)   . Chest discomfort    nuclear 06/24/09, suggestion prior small inferior MI with moderate peri-infarvt ischemia versus variable diaphragmatic attenuation  . Colon polyps    colonoscopy 07/02/06, repeat in 10 years  . Dyslipidemia   . Ejection fraction    EF 55%, echo, October, 2010  //   EF 45-50%,  catheterization, 2011  . Heart murmur   . Hyperlipidemia   . Migraines   . Mitral regurgitation    Mitral valve repair 2001  . Neurofibroma of foot 04/2017   s/p removal by derm  . Palpitations 07/2009    helped with carvedilol   .  Pleurisy   . Postural dizziness    mild, no orthostatic changes by blood pressure check  . Pre-syncope    January, 2014  . RBBB    First seen January, 2014  . Right ventricular dysfunction    Mild/moderate right ventricular dysfunction by echo, January, 2014, no tricuspid regurgitation, therefore right heart pressure could not be estimated.  . Shortness of breath   . Status post mitral valve repair    2001,  //  echo, October, 2010, good function of the repaired mitral valve  . Stroke ([place])   . SVT (supraventricular tachycardia) ([place]) 03/11/2013   Isolated episodes - last 03/2013 after flu treated with adenosine cardioversion in ER.   Marland Kitchen Thyroid  disease     Family History  Problem Relation Age of Onset  . Alzheimer's disease Mother   . Heart disease Mother        CHF  . Arthritis Father   . Heart disease Father        leaking mitral valve  . Benign prostatic hyperplasia Father   . Heart disease Brother        murmur  . Hypertension Neg Hx   . Diabetes Neg Hx   . Depression Neg Hx   . Alcohol abuse Neg Hx   . Drug abuse Neg Hx   . Stroke Neg Hx   . Cancer Neg Hx   . Colon cancer Neg Hx   . Esophageal cancer Neg Hx   . Rectal cancer Neg Hx   . Stomach cancer Neg Hx     Past Surgical History:  Procedure Laterality Date  . aneurysmal dilatation     proximal LAD cath 2001, reoeat catheter 2004, no significant change  . aneurysmal dilatiation  07/15/09   no change, normal right heart filling pressures and LVEDP- EF 45-50%  . APPENDECTOMY  1970  . BUBBLE STUDY  06/18/2019   Procedure: BUBBLE STUDY;  Surgeon: Sueanne Margarita, MD;  Location: [facility] ENDOSCOPY;  Service: Cardiovascular;;  . CARDIAC CATHETERIZATION  2010   mild global LV dysfunction EF 45-50%, no significant CAD obstruction, aneurysmal dilatation LAD  . COLONOSCOPY  2008  . COLONOSCOPY  06/2016   diverticulosis, rpt 10 yrs Ardis Hughs)  . CORONARY ANGIOPLASTY  09/1999   severe MR o/w normal limits  . CORONARY ANGIOPLASTY   12/19/02   pos. CAD, mild LV dysfunction- med treatment  . EYE SURGERY Left   . HERNIA REPAIR  01/11/05   left, Dr. Hassell Done  . I & D EXTREMITY Right 02/04/2015   Procedure: IRRIGATION AND DEBRIDEMENT RIGHT INDEX FINGER OPEN FRACTURE AND REPAIR DIGITAL NERVE AND ARTERY;  Surgeon: Leanora Cover, MD;  Location: [facility];  Service: Orthopedics;  Laterality: Right;  . LOOP RECORDER INSERTION N/A 06/18/2019   Procedure: LOOP RECORDER INSERTION;  Surgeon: Deboraha Sprang, MD;  Location: [facility] CV LAB;  Service: Cardiovascular;  Laterality: N/A;  . MITRAL VALVE REPAIR  01/22/00   good mitral valve function- echo 12/2008- EF 55% / nuclear 06/2009- EF 36%,? reliable? / EF 45-50%, cath rated 02/2010  . ORIF ELBOW FRACTURE     left  . POLYPECTOMY    . TEE WITHOUT CARDIOVERSION N/A 06/18/2019   Procedure: TRANSESOPHAGEAL ECHOCARDIOGRAM (TEE);  Surgeon: Sueanne Margarita, MD;  Location: [facility] ENDOSCOPY;  Service: Cardiovascular;  Laterality: N/A;  . THYROID SURGERY    . THYROIDECTOMY     left, due to goiter 1994   Social History   Occupational History  . Occupation: Artist  Tobacco Use  . Smoking status: Never Smoker  . Smokeless tobacco: Never Used  Vaping Use  . Vaping Use: Never used  Substance and Sexual Activity  . Alcohol use: No  . Drug use: No  . Sexual activity: Yes     Garald Balding, MD   Note - This record has been created using [employer].  Chart creation errors have been sought, but may not always  have been located. Such creation errors do not reflect on  the standard of medical care.

## 2020-03-22 ENCOUNTER — Ambulatory Visit (INDEPENDENT_AMBULATORY_CARE_PROVIDER_SITE_OTHER): Payer: Medicare Other

## 2020-03-22 DIAGNOSIS — I63449 Cerebral infarction due to embolism of unspecified cerebellar artery: Secondary | ICD-10-CM

## 2020-03-24 LAB — CUP PACEART REMOTE DEVICE CHECK
Date Time Interrogation Session: 20220115230553
Implantable Pulse Generator Implant Date: 20210414

## 2020-04-06 NOTE — Progress Notes (Signed)
[facility] Summary Report / Loop Recorder 

## 2020-04-26 ENCOUNTER — Ambulatory Visit (INDEPENDENT_AMBULATORY_CARE_PROVIDER_SITE_OTHER): Payer: Medicare Other

## 2020-04-26 DIAGNOSIS — I63449 Cerebral infarction due to embolism of unspecified cerebellar artery: Secondary | ICD-10-CM | POA: Diagnosis not present

## 2020-04-29 LAB — CUP PACEART REMOTE DEVICE CHECK
Date Time Interrogation Session: 20220224062842
Implantable Pulse Generator Implant Date: 20210414

## 2020-04-30 ENCOUNTER — Telehealth: Payer: Self-pay

## 2020-04-30 NOTE — Progress Notes (Signed)
[facility] Summary Report / Loop Recorder 

## 2020-04-30 NOTE — Telephone Encounter (Signed)
Patient called he would like a referral to be sent to [facility] imaging so he can have a mri done patient also stated he would like to get the epidural done there the same day call back:[phone]

## 2020-05-03 NOTE — Telephone Encounter (Signed)
MRI right knee... also needs another   ESI at [facility]

## 2020-05-04 ENCOUNTER — Other Ambulatory Visit: Payer: Self-pay

## 2020-05-04 DIAGNOSIS — G8929 Other chronic pain: Secondary | ICD-10-CM

## 2020-05-04 NOTE — Telephone Encounter (Signed)
Needs an appt if bothe knees hurt-also not sure medicare will allow two scans on same day

## 2020-05-04 NOTE — Telephone Encounter (Signed)
Patient wants to know if he can obtain an MRI of both knees?

## 2020-05-04 NOTE — Telephone Encounter (Signed)
Spoke with patient. I ordered the ESI at [facility]. He would prefer to have an MRI of his left knee because that bothers him more currently than his right. I have scheduled him to come in tomorrow morning to discuss with Dr.Whitfield.

## 2020-05-05 ENCOUNTER — Encounter: Payer: Self-pay | Admitting: Orthopaedic Surgery

## 2020-05-05 ENCOUNTER — Ambulatory Visit
Admission: RE | Admit: 2020-05-05 | Discharge: 2020-05-05 | Disposition: A | Discharge status: Home / Self Care | Payer: Medicare Other | Source: Ambulatory Visit | Attending: Orthopaedic Surgery | Admitting: Orthopaedic Surgery

## 2020-05-05 ENCOUNTER — Telehealth: Payer: Self-pay

## 2020-05-05 ENCOUNTER — Ambulatory Visit: Payer: Medicare Other | Admitting: Orthopaedic Surgery

## 2020-05-05 ENCOUNTER — Other Ambulatory Visit: Payer: Self-pay

## 2020-05-05 VITALS — Ht 71.0 in | Wt 235.0 lb

## 2020-05-05 DIAGNOSIS — G8929 Other chronic pain: Secondary | ICD-10-CM

## 2020-05-05 DIAGNOSIS — M1712 Unilateral primary osteoarthritis, left knee: Secondary | ICD-10-CM

## 2020-05-05 DIAGNOSIS — M5431 Sciatica, right side: Secondary | ICD-10-CM | POA: Diagnosis not present

## 2020-05-05 DIAGNOSIS — M47817 Spondylosis without myelopathy or radiculopathy, lumbosacral region: Secondary | ICD-10-CM | POA: Diagnosis not present

## 2020-05-05 MED ORDER — LIDOCAINE HCL 1 % IJ SOLN
2.0000 mL | INTRAMUSCULAR | Status: AC | PRN
  Administered 2020-05-05: 2 mL

## 2020-05-05 MED ORDER — BUPIVACAINE HCL 0.25 % IJ SOLN
2.0000 mL | INTRAMUSCULAR | Status: AC | PRN
  Administered 2020-05-05: 2 mL via INTRA_ARTICULAR

## 2020-05-05 MED ORDER — IOPAMIDOL (ISOVUE-M 200) INJECTION 41%
1.0000 mL | Freq: Once | INTRAMUSCULAR | Status: AC
  Administered 2020-05-05: 1 mL via EPIDURAL

## 2020-05-05 MED ORDER — METHYLPREDNISOLONE ACETATE 40 MG/ML INJ SUSP (RADIOLOG
120.0000 mg | Freq: Once | INTRAMUSCULAR | Status: AC
  Administered 2020-05-05: 120 mg via EPIDURAL

## 2020-05-05 NOTE — Telephone Encounter (Signed)
Noted  

## 2020-05-05 NOTE — Telephone Encounter (Signed)
Please precert for left knee visco injections. This is Dr.Whitfield's patient. Thanks! 

## 2020-05-05 NOTE — Discharge Instructions (Signed)

## 2020-05-05 NOTE — Progress Notes (Signed)
Office Visit Note   Patient: Alec Maldonado           Date of Birth: 1950/09/09           MRN: [id number] Visit Date: 05/05/2020              Requested by: Ria Bush, MD [address] [place],  [place] [address] PCP: Ria Bush, MD   Assessment & Plan: Visit Diagnoses:  1. Sciatica, right side   2. Primary osteoarthritis of left knee     Plan: Alec Maldonado has primary osteoarthritis of both knees.  He has had some response to cortisone injections bilaterally but notes that he does have compromise of his activities.  We have had a long discussion regarding the arthritis and x-ray findings and what he can expect over time.  He like to consider the viscosupplementation on the left.  I will repeat the cortisone injection.  He also is having recurrent problems with his back with the sciatica and will order the epidural steroid injection.  Plan to see him back once the Visco has been approved This patient is diagnosed with osteoarthritis of the knee(s).    Radiographs show evidence of joint space narrowing, osteophytes, subchondral sclerosis and/or subchondral cysts.  This patient has knee pain which interferes with functional and activities of daily living.    This patient has experienced inadequate response, adverse effects and/or intolerance with conservative treatments such as acetaminophen, NSAIDS, topical creams, physical therapy or regular exercise, knee bracing and/or weight loss.   This patient has experienced inadequate response or has a contraindication to intra articular steroid injections for at least 3 months.   This patient is not scheduled to have a total knee replacement within 6 months of starting treatment with viscosupplementation.   Follow-Up Instructions: Return in about 1 month (around 06/05/2020).   Orders:  Orders Placed This Encounter  Procedures  . Large Joint Inj: L knee   No orders of the defined types were placed in this encounter.      Procedures: Large Joint Inj: L knee on 05/05/2020 9:58 AM Indications: pain and diagnostic evaluation Details: 25 G 1.5 in needle, anteromedial approach  Arthrogram: No  Medications: 2 mL lidocaine 1 %; 2 mL bupivacaine 0.25 %  12 mg betamethasone injected the left knee with Marcaine and Xylocaine Procedure, treatment alternatives, risks and benefits explained, specific risks discussed. Consent was given by the patient. Patient was prepped and draped in the usual sterile fashion.       Clinical Data: No additional findings.   Subjective: Chief Complaint  Patient presents with  . Left Knee - Pain  . Right Knee - Pain  Patient presents today for follow up on his knees. He is wanting to get an MRI of his left knee. He states that it is bothering him more than his right knee. After further discussion he would like to have another cortisone injection in his left knee and then try viscosupplementation.  We will hold off on the MRI scan.  We will also order repeat epidural steroid injection at [facility] imaging  HPI  Review of Systems   Objective: Vital Signs: Ht 5\' 11"  (1.803 m)   Wt 235 lb (106.6 kg)   BMI 32.78 kg/m   Physical Exam Constitutional:      Appearance: He is well-developed and well-nourished.  HENT:     Mouth/Throat:     Mouth: Oropharynx is clear and moist.  Eyes:  Extraocular Movements: EOM normal.     Pupils: Pupils are equal, round, and reactive to light.  Pulmonary:     Effort: Pulmonary effort is normal.  Skin:    General: Skin is warm and dry.  Neurological:     Mental Status: He is alert and oriented to person, place, and time.  Psychiatric:        Mood and Affect: Mood and affect normal.        Behavior: Behavior normal.     Ortho Exam bilateral knee pain predominate medial compartment.  No effusion..  To have full extension today flexed over 105 degrees without instability.  Having more trouble with medial joint pain on the left than the  right knee.  Straight leg raise negative.  Painless range of motion both hips  Specialty Comments:  No specialty comments available.  Imaging: No results found.   PMFS History: Patient Active Problem List   Diagnosis Date Noted  . Bilateral primary osteoarthritis of knee 02/11/2020  . Posterior vitreous detachment, both eyes 01/13/2020  . Acute right-sided low back pain 10/19/2019  . Bowel habit changes 10/19/2019  . Perianal rash 10/19/2019  . LVH (left ventricular hypertrophy) 08/10/2019  . Central retinal artery occlusion of left eye 06/16/2019  . History 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 06/16/2019  . Obesity 06/16/2019  . Abnormal prostate by palpation 05/13/2019  . Primary osteoarthritis of left knee 05/06/2019  . Sciatica, right side 05/06/2019  . Pain in left knee 01/21/2019  . Medicare annual wellness visit, subsequent 02/23/2015  . Advanced care planning/counseling discussion 02/23/2015  . Health maintenance examination 08/28/2013  . Paroxysmal SVT (supraventricular tachycardia) ([place]) 03/11/2013  . Dysplastic nevus of trunk 08/20/2012  . Aortic insufficiency   . Right ventricular dysfunction   . RBBB   . Status post mitral valve repair   . Chest discomfort   . Mitral regurgitation   . Migraines   . Exertional dyspnea 12/14/2008  . Dyslipidemia 12/13/2008  . Benign prostatic hyperplasia with lower urinary tract symptoms 05/10/2007   Past Medical History:  Diagnosis Date  . Allergy   . Aortic insufficiency    Mild, echo, January, 2014  . BCC (basal cell carcinoma), back 2017   s/p excisional biospy by derm  . BPH (benign prostatic hypertrophy)   . Chest discomfort    nuclear 06/24/09, suggestion prior small inferior MI with moderate peri-infarvt ischemia versus variable diaphragmatic attenuation  . Colon polyps    colonoscopy 07/02/06, repeat in 10 years  . Dyslipidemia   . Ejection fraction    EF 55%, echo,  October, 2010  //   EF 45-50%,  catheterization, 2011  . Heart murmur   . Hyperlipidemia   . Migraines   . Mitral regurgitation    Mitral valve repair 2001  . Neurofibroma of foot 04/2017   s/p removal by derm  . Palpitations 07/2009    helped with carvedilol   . Pleurisy   . Postural dizziness    mild, no orthostatic changes by blood pressure check  . Pre-syncope    January, 2014  . RBBB    First seen January, 2014  . Right ventricular dysfunction    Mild/moderate right ventricular dysfunction by echo, January, 2014, no tricuspid regurgitation, therefore right heart pressure could not be estimated.  . Shortness of breath   . Status post mitral valve repair    2001,  //  echo, October, 2010, good function of the  repaired mitral valve  . Stroke ([place])   . SVT (supraventricular tachycardia) ([place]) 03/11/2013   Isolated episodes - last 03/2013 after flu treated with adenosine cardioversion in ER.   Marland Kitchen Thyroid disease     Family History  Problem Relation Age of Onset  . Alzheimer's disease Mother   . Heart disease Mother        CHF  . Arthritis Father   . Heart disease Father        leaking mitral valve  . Benign prostatic hyperplasia Father   . Heart disease Brother        murmur  . Hypertension Neg Hx   . Diabetes Neg Hx   . Depression Neg Hx   . Alcohol abuse Neg Hx   . Drug abuse Neg Hx   . Stroke Neg Hx   . Cancer Neg Hx   . Colon cancer Neg Hx   . Esophageal cancer Neg Hx   . Rectal cancer Neg Hx   . Stomach cancer Neg Hx     Past Surgical History:  Procedure Laterality Date  . aneurysmal dilatation     proximal LAD cath 2001, reoeat catheter 2004, no significant change  . aneurysmal dilatiation  07/15/09   no change, normal right heart filling pressures and LVEDP- EF 45-50%  . APPENDECTOMY  1970  . BUBBLE STUDY  06/18/2019   Procedure: BUBBLE STUDY;  Surgeon: Sueanne Margarita, MD;  Location: [facility] ENDOSCOPY;  Service: Cardiovascular;;  . CARDIAC CATHETERIZATION  2010    mild global LV dysfunction EF 45-50%, no significant CAD obstruction, aneurysmal dilatation LAD  . COLONOSCOPY  2008  . COLONOSCOPY  06/2016   diverticulosis, rpt 10 yrs Ardis Hughs)  . CORONARY ANGIOPLASTY  09/1999   severe MR o/w normal limits  . CORONARY ANGIOPLASTY  12/19/02   pos. CAD, mild LV dysfunction- med treatment  . EYE SURGERY Left   . HERNIA REPAIR  01/11/05   left, Dr. Hassell Done  . I & D EXTREMITY Right 02/04/2015   Procedure: IRRIGATION AND DEBRIDEMENT RIGHT INDEX FINGER OPEN FRACTURE AND REPAIR DIGITAL NERVE AND ARTERY;  Surgeon: Leanora Cover, MD;  Location: [facility];  Service: Orthopedics;  Laterality: Right;  . LOOP RECORDER INSERTION N/A 06/18/2019   Procedure: LOOP RECORDER INSERTION;  Surgeon: Deboraha Sprang, MD;  Location: [facility] CV LAB;  Service: Cardiovascular;  Laterality: N/A;  . MITRAL VALVE REPAIR  01/22/00   good mitral valve function- echo 12/2008- EF 55% / nuclear 06/2009- EF 36%,? reliable? / EF 45-50%, cath rated 02/2010  . ORIF ELBOW FRACTURE     left  . POLYPECTOMY    . TEE WITHOUT CARDIOVERSION N/A 06/18/2019   Procedure: TRANSESOPHAGEAL ECHOCARDIOGRAM (TEE);  Surgeon: Sueanne Margarita, MD;  Location: [facility] ENDOSCOPY;  Service: Cardiovascular;  Laterality: N/A;  . THYROID SURGERY    . THYROIDECTOMY     left, due to goiter 1994   Social History   Occupational History  . Occupation: Artist  Tobacco Use  . Smoking status: Never Smoker  . Smokeless tobacco: Never Used  Vaping Use  . Vaping Use: Never used  Substance and Sexual Activity  . Alcohol use: No  . Drug use: No  . Sexual activity: Yes

## 2020-05-06 NOTE — Telephone Encounter (Signed)
Patient called stating that he would like to hold off on getting gel injection and would like to proceed with surgery.  CB# [phone].  Please advise.  Thank you.

## 2020-05-06 NOTE — Telephone Encounter (Signed)
Call and ask which knee- start the surgery form with that info and I will complete

## 2020-05-09 ENCOUNTER — Other Ambulatory Visit: Payer: Self-pay | Admitting: Family Medicine

## 2020-05-09 DIAGNOSIS — R7303 Prediabetes: Secondary | ICD-10-CM

## 2020-05-09 DIAGNOSIS — E785 Hyperlipidemia, unspecified: Secondary | ICD-10-CM

## 2020-05-09 DIAGNOSIS — N401 Enlarged prostate with lower urinary tract symptoms: Secondary | ICD-10-CM

## 2020-05-09 NOTE — Addendum Note (Signed)
Addended by: Ria Bush on: 05/09/2020 11:18 PM   Modules accepted: Orders

## 2020-05-13 ENCOUNTER — Ambulatory Visit: Payer: Medicare Other

## 2020-05-13 ENCOUNTER — Other Ambulatory Visit: Payer: Self-pay

## 2020-05-13 ENCOUNTER — Other Ambulatory Visit (INDEPENDENT_AMBULATORY_CARE_PROVIDER_SITE_OTHER): Payer: Medicare Other

## 2020-05-13 DIAGNOSIS — N401 Enlarged prostate with lower urinary tract symptoms: Secondary | ICD-10-CM

## 2020-05-13 DIAGNOSIS — R7303 Prediabetes: Secondary | ICD-10-CM | POA: Diagnosis not present

## 2020-05-13 DIAGNOSIS — E785 Hyperlipidemia, unspecified: Secondary | ICD-10-CM

## 2020-05-13 LAB — LIPID PANEL
Cholesterol: 132 mg/dL (ref 0–200)
HDL: 46.4 mg/dL (ref >39.00)
LDL Cholesterol: 66 mg/dL (ref 0–99)
NonHDL: 85.3
Total CHOL/HDL Ratio: 3
Triglycerides: 95 mg/dL (ref 0.0–149.0)
VLDL: 19 mg/dL (ref 0.0–40.0)

## 2020-05-13 LAB — COMPREHENSIVE METABOLIC PANEL
ALT: 35 U/L (ref 0–53)
AST: 15 U/L (ref 0–37)
Albumin: 3.9 g/dL (ref 3.5–5.2)
Alkaline Phosphatase: 53 U/L (ref 39–117)
BUN: 21 mg/dL (ref 6–23)
CO2: 29 mEq/L (ref 19–32)
Calcium: 9.1 mg/dL (ref 8.4–10.5)
Chloride: 104 mEq/L (ref 96–112)
Creatinine, Ser: 1.26 mg/dL (ref 0.40–1.50)
GFR: 57.81 mL/min — ABNORMAL LOW (ref >60.00)
Glucose, Bld: 103 mg/dL — ABNORMAL HIGH (ref 70–99)
Potassium: 4.3 mEq/L (ref 3.5–5.1)
Sodium: 141 mEq/L (ref 135–145)
Total Bilirubin: 0.6 mg/dL (ref 0.2–1.2)
Total Protein: 6.2 g/dL (ref 6.0–8.3)

## 2020-05-13 LAB — HEMOGLOBIN A1C: Hgb A1c MFr Bld: 6.1 % (ref 4.6–6.5)

## 2020-05-13 LAB — PSA: PSA: 0.6 ng/mL (ref 0.10–4.00)

## 2020-05-14 ENCOUNTER — Ambulatory Visit (INDEPENDENT_AMBULATORY_CARE_PROVIDER_SITE_OTHER): Payer: Medicare Other

## 2020-05-14 DIAGNOSIS — Z0000 Encounter for general adult medical examination without abnormal findings: Secondary | ICD-10-CM

## 2020-05-14 NOTE — Patient Instructions (Signed)
Alec Maldonado , Thank you for taking time to come for your Medicare Wellness Visit. I appreciate your ongoing commitment to your health goals. Please review the following plan we discussed and let me know if I can assist you in the future.   Screening recommendations/referrals: Colonoscopy: Up to date, completed 06/28/2016, due 06/2026 Recommended yearly ophthalmology/optometry visit for glaucoma screening and checkup Recommended yearly dental visit for hygiene and checkup  Vaccinations: Influenza vaccine: Up to date, completed 12/2019, due 10/2020 Pneumococcal vaccine: Completed series Tdap vaccine: Up to date, completed 02/04/2015, due 02/2025 Shingles vaccine: due, check with your insurance regarding coverage if interested    Covid-19: Completed series  Advanced directives: copy in chart  Conditions/risks identified: hyperlipidemia  Next appointment: Follow up in one year for your annual wellness visit.   Preventive Care 71 Years and Older, Male Preventive care refers to lifestyle choices and visits with your health care provider that can promote health and wellness. What does preventive care include?  A yearly physical exam. This is also called an annual well check.  Dental exams once or twice a year.  Routine eye exams. Ask your health care provider how often you should have your eyes checked.  Personal lifestyle choices, including:  Daily care of your teeth and gums.  Regular physical activity.  Eating a healthy diet.  Avoiding tobacco and drug use.  Limiting alcohol use.  Practicing safe sex.  Taking low doses of aspirin every day.  Taking vitamin and mineral supplements as recommended by your health care provider. What happens during an annual well check? The services and screenings done by your health care provider during your annual well check will depend on your age, overall health, lifestyle risk factors, and family history of disease. Counseling  Your health  care provider may ask you questions about your:  Alcohol use.  Tobacco use.  Drug use.  Emotional well-being.  Home and relationship well-being.  Sexual activity.  Eating habits.  History of falls.  Memory and ability to understand (cognition).  Work and work Statistician. Screening  You may have the following tests or measurements:  Height, weight, and BMI.  Blood pressure.  Lipid and cholesterol levels. These may be checked every 5 years, or more frequently if you are over 32 years old.  Skin check.  Lung cancer screening. You may have this screening every year starting at age 36 if you have a 30-pack-year history of smoking and currently smoke or have quit within the past 15 years.  Fecal occult blood test (FOBT) of the stool. You may have this test every year starting at age 25.  Flexible sigmoidoscopy or colonoscopy. You may have a sigmoidoscopy every 5 years or a colonoscopy every 10 years starting at age 6.  Prostate cancer screening. Recommendations will vary depending on your family history and other risks.  Hepatitis C blood test.  Hepatitis B blood test.  Sexually transmitted disease (STD) testing.  Diabetes screening. This is done by checking your blood sugar (glucose) after you have not eaten for a while (fasting). You may have this done every 1-3 years.  Abdominal aortic aneurysm (AAA) screening. You may need this if you are a current or former smoker.  Osteoporosis. You may be screened starting at age 14 if you are at high risk. Talk with your health care provider about your test results, treatment options, and if necessary, the need for more tests. Vaccines  Your health care provider may recommend certain vaccines, such as:  Influenza vaccine. This is recommended every year.  Tetanus, diphtheria, and acellular pertussis (Tdap, Td) vaccine. You may need a Td booster every 10 years.  Zoster vaccine. You may need this after age  7.  Pneumococcal 13-valent conjugate (PCV13) vaccine. One dose is recommended after age 54.  Pneumococcal polysaccharide (PPSV23) vaccine. One dose is recommended after age 41. Talk to your health care provider about which screenings and vaccines you need and how often you need them. This information is not intended to replace advice given to you by your health care provider. Make sure you discuss any questions you have with your health care provider. Document Released: 03/19/2015 Document Revised: 11/10/2015 Document Reviewed: 12/22/2014 Elsevier Interactive Patient Education  2017 [employer] Prevention in the Home Falls can cause injuries. They can happen to people of all ages. There are many things you can do to make your home safe and to help prevent falls. What can I do on the outside of my home?  Regularly fix the edges of walkways and driveways and fix any cracks.  Remove anything that might make you trip as you walk through a door, such as a raised step or threshold.  Trim any bushes or trees on the path to your home.  Use bright outdoor lighting.  Clear any walking paths of anything that might make someone trip, such as rocks or tools.  Regularly check to see if handrails are loose or broken. Make sure that both sides of any steps have handrails.  Any raised decks and porches should have guardrails on the edges.  Have any leaves, snow, or ice cleared regularly.  Use sand or salt on walking paths during winter.  Clean up any spills in your garage right away. This includes oil or grease spills. What can I do in the bathroom?  Use night lights.  Install grab bars by the toilet and in the tub and shower. Do not use towel bars as grab bars.  Use non-skid mats or decals in the tub or shower.  If you need to sit down in the shower, use a plastic, non-slip stool.  Keep the floor dry. Clean up any water that spills on the floor as soon as it happens.  Remove  soap buildup in the tub or shower regularly.  Attach bath mats securely with double-sided non-slip rug tape.  Do not have throw rugs and other things on the floor that can make you trip. What can I do in the bedroom?  Use night lights.  Make sure that you have a light by your bed that is easy to reach.  Do not use any sheets or blankets that are too big for your bed. They should not hang down onto the floor.  Have a firm chair that has side arms. You can use this for support while you get dressed.  Do not have throw rugs and other things on the floor that can make you trip. What can I do in the kitchen?  Clean up any spills right away.  Avoid walking on wet floors.  Keep items that you use a lot in easy-to-reach places.  If you need to reach something above you, use a strong step stool that has a grab bar.  Keep electrical cords out of the way.  Do not use floor polish or wax that makes floors slippery. If you must use wax, use non-skid floor wax.  Do not have throw rugs and other things on the floor that  can make you trip. What can I do with my stairs?  Do not leave any items on the stairs.  Make sure that there are handrails on both sides of the stairs and use them. Fix handrails that are broken or loose. Make sure that handrails are as long as the stairways.  Check any carpeting to make sure that it is firmly attached to the stairs. Fix any carpet that is loose or worn.  Avoid having throw rugs at the top or bottom of the stairs. If you do have throw rugs, attach them to the floor with carpet tape.  Make sure that you have a light switch at the top of the stairs and the bottom of the stairs. If you do not have them, ask someone to add them for you. What else can I do to help prevent falls?  Wear shoes that:  Do not have high heels.  Have rubber bottoms.  Are comfortable and fit you well.  Are closed at the toe. Do not wear sandals.  If you use a  stepladder:  Make sure that it is fully opened. Do not climb a closed stepladder.  Make sure that both sides of the stepladder are locked into place.  Ask someone to hold it for you, if possible.  Clearly mark and make sure that you can see:  Any grab bars or handrails.  First and last steps.  Where the edge of each step is.  Use tools that help you move around (mobility aids) if they are needed. These include:  Canes.  Walkers.  Scooters.  Crutches.  Turn on the lights when you go into a dark area. Replace any light bulbs as soon as they burn out.  Set up your furniture so you have a clear path. Avoid moving your furniture around.  If any of your floors are uneven, fix them.  If there are any pets around you, be aware of where they are.  Review your medicines with your doctor. Some medicines can make you feel dizzy. This can increase your chance of falling. Ask your doctor what other things that you can do to help prevent falls. This information is not intended to replace advice given to you by your health care provider. Make sure you discuss any questions you have with your health care provider. Document Released: 12/17/2008 Document Revised: 07/29/2015 Document Reviewed: 03/27/2014 Elsevier Interactive Patient Education  2017 [employer].

## 2020-05-14 NOTE — Progress Notes (Signed)
PCP notes:  Health Maintenance: No gaps noted    Abnormal Screenings: none   Patient concerns: none   Nurse concerns: none   Next PCP appt.: 05/17/2020 @ 9:30 am

## 2020-05-14 NOTE — Progress Notes (Signed)
Subjective:   Alec Maldonado is a 70 y.o. male who presents for Medicare Annual/Subsequent preventive examination.  Review of Systems: N/A     I connected with the patient today by telephone and verified that I am speaking with the correct person using two identifiers. Location patient: home Location nurse: work Persons participating in the telephone visit: patient, nurse.   I discussed the limitations, risks, security and privacy concerns of performing an evaluation and management service by telephone and the availability of in person appointments. I also discussed with the patient that there may be a patient responsible charge related to this service. The patient expressed understanding and verbally consented to this telephonic visit.        Cardiac Risk Factors include: advanced age (>46men, >54 women);male gender;Other (see comment), Risk factor comments: hyperlipidemia     Objective:    Today's Vitals   There is no height or weight on file to calculate BMI.  Advanced Directives 05/14/2020 06/16/2019 05/10/2018 06/20/2017 06/14/2016 02/24/2016 02/04/2015  Does Patient Have a Medical Advance Directive? Yes No Yes Yes Yes Yes No;Yes  Type of Paramedic of [place];Living will - [place];Living will [place];Living will [place];Living will [place];Living will -  Copy of [place] in Chart? Yes - validated most recent copy scanned in chart (See row information) - No - copy requested No - copy requested - No - copy requested -  Would patient like information on creating a medical advance directive? - No - Patient declined - - - - -    Current Medications (verified) Outpatient Encounter Medications as of 05/14/2020  Medication Sig  . aspirin 81 MG tablet Take 81 mg by mouth daily.  . butalbital-acetaminophen-caffeine (FIORICET, ESGIC) 50-325-40 MG tablet Take 1  tablet by mouth every 6 (six) hours as needed. (Patient taking differently: Take 1 tablet by mouth every 6 (six) hours as needed for headache or migraine.)  . carvedilol (COREG) 6.25 MG tablet Take 1 tablet (6.25 mg total) by mouth 2 (two) times daily with a meal.  . diclofenac Sodium (VOLTAREN) 1 % GEL Apply 2 g topically as needed (knee pain).   Marland Kitchen ezetimibe (ZETIA) 10 MG tablet TAKE 1 TABLET(10 MG) BY MOUTH DAILY (Patient taking differently: Take 10 mg by mouth daily. TAKE 1 TABLET(10 MG) BY MOUTH DAILY)  . finasteride (PROSCAR) 5 MG tablet Take 1 tablet (5 mg total) by mouth daily.  Marland Kitchen ibuprofen (ADVIL) 200 MG tablet Take 400 mg by mouth every 6 (six) hours as needed for moderate pain.  . Omega-3 Fatty Acids (FISH OIL) 1200 MG CAPS Take 1,200 mg by mouth daily. Reported on 02/23/2015  . simvastatin (ZOCOR) 80 MG tablet TAKE 1 TABLET(80 MG) BY MOUTH DAILY (Patient taking differently: Take 80 mg by mouth daily at 6 PM. TAKE 1 TABLET(80 MG) BY MOUTH DAILY)  . tamsulosin (FLOMAX) 0.4 MG CAPS capsule Take 1 capsule (0.4 mg total) by mouth at bedtime.   No facility-administered encounter medications on file as of 05/14/2020.    Allergies (verified) Patient has no known allergies.   History: Past Medical History:  Diagnosis Date  . Allergy   . Aortic insufficiency    Mild, echo, January, 2014  . BCC (basal cell carcinoma), back 2017   s/p excisional biospy by derm  . BPH (benign prostatic hypertrophy)   . Chest discomfort    nuclear 06/24/09, suggestion prior small inferior  MI with moderate peri-infarvt ischemia versus variable diaphragmatic attenuation  . Colon polyps    colonoscopy 07/02/06, repeat in 10 years  . Dyslipidemia   . Ejection fraction    EF 55%, echo, October, 2010  //   EF 45-50%,  catheterization, 2011  . Heart murmur   . Hyperlipidemia   . Migraines   . Mitral regurgitation    Mitral valve repair 2001  . Neurofibroma of foot 04/2017   s/p removal by derm  .  Palpitations 07/2009    helped with carvedilol   . Pleurisy   . Postural dizziness    mild, no orthostatic changes by blood pressure check  . Pre-syncope    January, 2014  . RBBB    First seen January, 2014  . Right ventricular dysfunction    Mild/moderate right ventricular dysfunction by echo, January, 2014, no tricuspid regurgitation, therefore right heart pressure could not be estimated.  . Shortness of breath   . Status post mitral valve repair    2001,  //  echo, October, 2010, good function of the repaired mitral valve  . Stroke ([place])   . SVT (supraventricular tachycardia) ([place]) 03/11/2013   Isolated episodes - last 03/2013 after flu treated with adenosine cardioversion in ER.   Marland Kitchen Thyroid disease    Past Surgical History:  Procedure Laterality Date  . aneurysmal dilatation     proximal LAD cath 2001, reoeat catheter 2004, no significant change  . aneurysmal dilatiation  07/15/09   no change, normal right heart filling pressures and LVEDP- EF 45-50%  . APPENDECTOMY  1970  . BUBBLE STUDY  06/18/2019   Procedure: BUBBLE STUDY;  Surgeon: Sueanne Margarita, MD;  Location: [facility] ENDOSCOPY;  Service: Cardiovascular;;  . CARDIAC CATHETERIZATION  2010   mild global LV dysfunction EF 45-50%, no significant CAD obstruction, aneurysmal dilatation LAD  . COLONOSCOPY  2008  . COLONOSCOPY  06/2016   diverticulosis, rpt 10 yrs Ardis Hughs)  . CORONARY ANGIOPLASTY  09/1999   severe MR o/w normal limits  . CORONARY ANGIOPLASTY  12/19/02   pos. CAD, mild LV dysfunction- med treatment  . EYE SURGERY Left   . HERNIA REPAIR  01/11/05   left, Dr. Hassell Done  . I & D EXTREMITY Right 02/04/2015   Procedure: IRRIGATION AND DEBRIDEMENT RIGHT INDEX FINGER OPEN FRACTURE AND REPAIR DIGITAL NERVE AND ARTERY;  Surgeon: Leanora Cover, MD;  Location: [facility];  Service: Orthopedics;  Laterality: Right;  . LOOP RECORDER INSERTION N/A 06/18/2019   Procedure: LOOP RECORDER INSERTION;  Surgeon: Deboraha Sprang, MD;  Location: [facility];  Service: Cardiovascular;  Laterality: N/A;  . MITRAL VALVE REPAIR  01/22/00   good mitral valve function- echo 12/2008- EF 55% / nuclear 06/2009- EF 36%,? reliable? / EF 45-50%, cath rated 02/2010  . ORIF ELBOW FRACTURE     left  . POLYPECTOMY    . TEE WITHOUT CARDIOVERSION N/A 06/18/2019   Procedure: TRANSESOPHAGEAL ECHOCARDIOGRAM (TEE);  Surgeon: Sueanne Margarita, MD;  Location: [facility] ENDOSCOPY;  Service: Cardiovascular;  Laterality: N/A;  . THYROID SURGERY    . THYROIDECTOMY     left, due to goiter 1994   Family History  Problem Relation Age of Onset  . Alzheimer's disease Mother   . Heart disease Mother        CHF  . Arthritis Father   . Heart disease Father        leaking mitral valve  . Benign prostatic hyperplasia Father   .  Heart disease Brother        murmur  . Hypertension Neg Hx   . Diabetes Neg Hx   . Depression Neg Hx   . Alcohol abuse Neg Hx   . Drug abuse Neg Hx   . Stroke Neg Hx   . Cancer Neg Hx   . Colon cancer Neg Hx   . Esophageal cancer Neg Hx   . Rectal cancer Neg Hx   . Stomach cancer Neg Hx    Social History   Socioeconomic History  . Marital status: Married    Spouse name: Not on file  . Number of children: 2  . Years of education: Not on file  . Highest education level: Not on file  Occupational History  . Occupation: Artist  Tobacco Use  . Smoking status: Never Smoker  . Smokeless tobacco: Never Used  Vaping Use  . Vaping Use: Never used  Substance and Sexual Activity  . Alcohol use: No  . Drug use: No  . Sexual activity: Yes  Other Topics Concern  . Not on file  Social History Narrative   Widowed, wife deceased colon cancer August 11, 1996, remarried 2001, lives with remarried wife   Occupation: retired, worked for [employer], site   Activity: walking 30-36min 4-5d/wk   Diet: good water, fruits/vegetables daily   Social Determinants of Radio broadcast assistant Strain: Low Risk   . Difficulty of  Paying Living Expenses: Not hard at all  Food Insecurity: No Food Insecurity  . Worried About Charity fundraiser in the Last Year: Never true  . Ran Out of Food in the Last Year: Never true  Transportation Needs: No Transportation Needs  . Lack of Transportation (Medical): No  . Lack of Transportation (Non-Medical): No  Physical Activity: Inactive  . Days of Exercise per Week: 0 days  . Minutes of Exercise per Session: 0 min  Stress: No Stress Concern Present  . Feeling of Stress : Not at all  Social Connections: Not on file    Tobacco Counseling Counseling given: Not Answered   Clinical Intake:  Pre-visit preparation completed: Yes  Pain : No/denies pain     Nutritional Risks: None Diabetes: No  How often do you need to have someone help you when you read instructions, pamphlets, or other written materials from your doctor or pharmacy?: 1 - Never  Diabetic: No Nutrition Risk Assessment:  Has the patient had any N/V/D within the last 2 months?  No  Does the patient have any non-healing wounds?  No  Has the patient had any unintentional weight loss or weight gain?  No   Diabetes:  Is the patient diabetic?  No  If diabetic, was a CBG obtained today?  N/A Did the patient bring in their glucometer from home?  N/A How often do you monitor your CBG's? N/A.   Financial Strains and Diabetes Management:  Are you having any financial strains with the device, your supplies or your medication? N/A.  Does the patient want to be seen by Chronic Care Management for management of their diabetes?  N/A Would the patient like to be referred to a Nutritionist or for Diabetic Management?  N/A   Interpreter Needed?: No  Information entered by :: CJohnson, LPN   Activities of Daily Living In your present state of health, do you have any difficulty performing the following activities: 05/14/2020  Hearing? N  Vision? N  Difficulty concentrating or making decisions? N  Walking or  climbing stairs? N  Dressing or bathing? N  Doing errands, shopping? N  Preparing Food and eating ? N  Using the Toilet? N  In the past six months, have you accidently leaked urine? N  Do you have problems with loss of bowel control? N  Managing your Medications? N  Managing your Finances? N  Housekeeping or managing your Housekeeping? N  Some recent data might be hidden    Patient Care Team: Ria Bush, MD as PCP - General (Family Medicine) Buford Dresser, MD as PCP - Cardiology (Cardiology)  Indicate any recent Medical Services you may have received from other than [facility] providers in the past year (date may be approximate).     Assessment:   This is a routine wellness examination for Alec Maldonado.  Hearing/Vision screen  Hearing Screening   125Hz  250Hz  500Hz  1000Hz  2000Hz  3000Hz  4000Hz  6000Hz  8000Hz   Right ear:           Left ear:           Vision Screening Comments: Patient gets annual eye exams   Dietary issues and exercise activities discussed: Current Exercise Habits: The patient does not participate in regular exercise at present, Exercise limited by: None identified  Goals    . Increase physical activity     Starting 05/10/2018 I will continue to walk 1-2 miles once weekly and to chop wood for 2 hours every other day.     . Patient Stated     05/14/2020, I will maintain and continue medication as prescribed.       Depression Screen PHQ 2/9 Scores 05/14/2020 05/13/2019 05/10/2018 03/13/2017 02/24/2016 02/23/2015  PHQ - 2 Score 0 0 0 0 0 0  PHQ- 9 Score 0 - 0 - - -    Fall Risk Fall Risk  05/14/2020 07/24/2019 05/13/2019 05/10/2018 03/13/2017  Falls in the past year? 0 0 0 0 No  Number falls in past yr: 0 - - - -  Injury with Fall? 0 - - - -  Risk for fall due to : Medication side effect - - - -  Follow up Falls evaluation completed;Falls prevention discussed - - - -    FALL RISK PREVENTION PERTAINING TO THE HOME:  Any stairs in or around the home? Yes  If so,  are there any without handrails? No  Home free of loose throw rugs in walkways, pet beds, electrical cords, etc? Yes  Adequate lighting in your home to reduce risk of falls? Yes   ASSISTIVE DEVICES UTILIZED TO PREVENT FALLS:  Life alert? No  Use of a cane, walker or w/c? No  Grab bars in the bathroom? No  Shower chair or bench in shower? No  Elevated toilet seat or a handicapped toilet? No   TIMED UP AND GO:  Was the test performed? N/A telephone visit .    Cognitive Function: MMSE - Mini Mental State Exam 05/14/2020 05/10/2018 02/24/2016  Not completed: Refused - -  Orientation to time - 5 5  Orientation to Place - 5 5  Registration - 3 3  Attention/ Calculation - 0 0  Recall - 3 3  Language- name 2 objects - 0 0  Language- repeat - 1 1  Language- follow 3 step command - 3 3  Language- read & follow direction - 0 0  Write a sentence - 0 0  Copy design - 0 0  Total score - 20 20  Mini Cog  Mini-Cog screen was not completed. Maximum score is  22. A value of 0 denotes this part of the MMSE was not completed or the patient failed this part of the Mini-Cog screening.       Immunizations Immunization History  Administered Date(s) Administered  . Influenza, High Dose Seasonal PF 02/19/2017, 12/21/2017  . Influenza, Quadrivalent, Recombinant, Inj, Pf 11/30/2018  . Influenza,inj,Quad PF,6+ Mos 02/24/2016  . Influenza-Unspecified 12/05/2019  . PFIZER(Purple Top)SARS-COV-2 Vaccination 04/20/2019, 05/14/2019, 12/05/2019  . Pneumococcal Conjugate-13 02/23/2015  . Pneumococcal Polysaccharide-23 02/24/2016  . Td 02/25/2003  . Tdap 08/28/2013, 02/04/2015    TDAP status: Up to date  Flu Vaccine status: Up to date  Pneumococcal vaccine status: Up to date  Covid-19 vaccine status: Completed vaccines  Qualifies for Shingles Vaccine? Yes   Zostavax completed No   Shingrix Completed?: No.    Education has been provided regarding the importance of this vaccine. Patient has been  advised to call insurance company to determine out of pocket expense if they have not yet received this vaccine. Advised may also receive vaccine at local pharmacy or Health Dept. Verbalized acceptance and understanding.  Screening Tests Health Maintenance  Topic Date Due  . COVID-19 Vaccine (4 - Booster for Pfizer series) 06/04/2020  . TETANUS/TDAP  02/03/2025  . COLONOSCOPY (Pts 45-90yrs Insurance coverage will need to be confirmed)  06/29/2026  . INFLUENZA VACCINE  Completed  . Hepatitis C Screening  Completed  . PNA vac Low Risk Adult  Completed  . HPV VACCINES  Aged Out    Health Maintenance  There are no preventive care reminders to display for this patient.  Colorectal cancer screening: Type of screening: Colonoscopy. Completed 06/28/2016. Repeat every 10 years  Lung Cancer Screening: (Low Dose CT Chest recommended if Age 79-80 years, 30 pack-year currently smoking OR have quit w/in 15years.) does not qualify.   Additional Screening:  Hepatitis C Screening: does qualify; Completed 02/16/2015  Vision Screening: Recommended annual ophthalmology exams for early detection of glaucoma and other disorders of the eye. Is the patient up to date with their annual eye exam?  Yes  Who is the provider or what is the name of the office in which the patient attends annual eye exams? [facility] If pt is not established with a provider, would they like to be referred to a provider to establish care? No .   Dental Screening: Recommended annual dental exams for proper oral hygiene  Community Resource Referral / Chronic Care Management: CRR required this visit?  No   CCM required this visit?  No      Plan:     I have personally reviewed and noted the following in the patient's chart:   . Medical and social history . Use of alcohol, tobacco or illicit drugs  . Current medications and supplements . Functional ability and status . Nutritional status . Physical  activity . Advanced directives . List of other physicians . Hospitalizations, surgeries, and ER visits in previous 12 months . Vitals . Screenings to include cognitive, depression, and falls . Referrals and appointments  In addition, I have reviewed and discussed with patient certain preventive protocols, quality metrics, and best practice recommendations. A written personalized care plan for preventive services as well as general preventive health recommendations were provided to patient.   Due to this being a telephonic visit, the after visit summary with patients personalized plan was offered to patient via office or my-chart. Patient preferred to pick up at office at next visit or via mychart.   Andrez Grime,  LPN   [id number]

## 2020-05-17 ENCOUNTER — Telehealth: Payer: Self-pay | Admitting: Cardiology

## 2020-05-17 ENCOUNTER — Ambulatory Visit (INDEPENDENT_AMBULATORY_CARE_PROVIDER_SITE_OTHER)
Admission: RE | Admit: 2020-05-17 | Discharge: 2020-05-17 | Disposition: A | Discharge status: Home / Self Care | Payer: Medicare Other | Source: Ambulatory Visit | Attending: Family Medicine | Admitting: Family Medicine

## 2020-05-17 ENCOUNTER — Other Ambulatory Visit: Payer: Self-pay

## 2020-05-17 ENCOUNTER — Encounter: Payer: Self-pay | Admitting: Family Medicine

## 2020-05-17 ENCOUNTER — Ambulatory Visit (INDEPENDENT_AMBULATORY_CARE_PROVIDER_SITE_OTHER): Payer: Medicare Other | Admitting: Family Medicine

## 2020-05-17 VITALS — BP 128/68 | HR 60 | Temp 97.5°F | Ht 70.0 in | Wt 237.4 lb

## 2020-05-17 DIAGNOSIS — Z01818 Encounter for other preprocedural examination: Secondary | ICD-10-CM

## 2020-05-17 DIAGNOSIS — Z0000 Encounter for general adult medical examination without abnormal findings: Secondary | ICD-10-CM

## 2020-05-17 DIAGNOSIS — H3412 Central retinal artery occlusion, left eye: Secondary | ICD-10-CM | POA: Diagnosis not present

## 2020-05-17 DIAGNOSIS — I517 Cardiomegaly: Secondary | ICD-10-CM | POA: Diagnosis not present

## 2020-05-17 DIAGNOSIS — G43909 Migraine, unspecified, not intractable, without status migrainosus: Secondary | ICD-10-CM | POA: Diagnosis not present

## 2020-05-17 DIAGNOSIS — M1712 Unilateral primary osteoarthritis, left knee: Secondary | ICD-10-CM | POA: Diagnosis not present

## 2020-05-17 DIAGNOSIS — Z9889 Other specified postprocedural states: Secondary | ICD-10-CM | POA: Diagnosis not present

## 2020-05-17 DIAGNOSIS — N401 Enlarged prostate with lower urinary tract symptoms: Secondary | ICD-10-CM | POA: Diagnosis not present

## 2020-05-17 DIAGNOSIS — N3943 Post-void dribbling: Secondary | ICD-10-CM

## 2020-05-17 DIAGNOSIS — E785 Hyperlipidemia, unspecified: Secondary | ICD-10-CM

## 2020-05-17 LAB — POC URINALSYSI DIPSTICK (AUTOMATED)
Bilirubin, UA: NEGATIVE
Blood, UA: NEGATIVE
Glucose, UA: NEGATIVE
Ketones, UA: NEGATIVE
Leukocytes, UA: NEGATIVE
Nitrite, UA: NEGATIVE
Protein, UA: NEGATIVE
Spec Grav, UA: >=1.03 — AB
Urobilinogen, UA: 0.2 U/dL
pH, UA: 5.5

## 2020-05-17 MED ORDER — FINASTERIDE 5 MG PO TABS
5.0000 mg | ORAL_TABLET | Freq: Every day | ORAL | 3 refills | Status: DC
Start: 2020-05-17 — End: 2020-08-24

## 2020-05-17 MED ORDER — SIMVASTATIN 80 MG PO TABS: ORAL_TABLET | ORAL | 3 refills | Status: DC

## 2020-05-17 MED ORDER — BUTALBITAL-APAP-CAFFEINE 50-325-40 MG PO TABS: 1.0000 | ORAL_TABLET | Freq: Four times a day (QID) | ORAL | 1 refills | Status: DC | PRN

## 2020-05-17 MED ORDER — EZETIMIBE 10 MG PO TABS: ORAL_TABLET | ORAL | 3 refills | Status: DC

## 2020-05-17 MED ORDER — CARVEDILOL 6.25 MG PO TABS
6.2500 mg | ORAL_TABLET | Freq: Two times a day (BID) | ORAL | 3 refills | Status: DC
Start: 2020-05-17 — End: 2021-06-13

## 2020-05-17 MED ORDER — TAMSULOSIN HCL 0.4 MG PO CAPS: 0.8000 mg | ORAL_CAPSULE | Freq: Every day | ORAL | 3 refills | Status: DC

## 2020-05-17 NOTE — Progress Notes (Signed)
Patient ID: OCIE TINO, male    DOB: 1950-04-26, 70 y.o.   MRN: [id number]  This visit was conducted in person.  BP 128/68   Pulse 60   Temp (!) 97.5 F (36.4 C) (Temporal)   Ht 5\' 10"  (1.778 m)   Wt 237 lb 6 oz (107.7 kg)   SpO2 98%   BMI 34.06 kg/m    CC: CPE Subjective:   HPI: GILLIAM HAWKES is a 70 y.o. male presenting on 05/17/2020 for Annual Exam (Prt 2. )   Saw health advisor last week for medicare wellness visit. Note reviewed.    No exam data present  Flowsheet Row Clinical Support from 05/14/2020 in [place] at [place]  PHQ-2 Total Score 0      Fall Risk  05/14/2020 07/24/2019 05/13/2019 05/10/2018 03/13/2017  Falls in the past year? 0 0 0 0 No  Number falls in past yr: 0 - - - -  Injury with Fall? 0 - - - -  Risk for fall due to : Medication side effect - - - -  Follow up Falls evaluation completed;Falls prevention discussed - - - -    Upcoming L total knee replacement through Dr Geannie Risen in [place]. Date TBD. Unsure type of anesthesia.  He has previously tolerated general anesthesia, latest 2006 for L hernia repair. No difficulty awakening after surgery or post op nausea/vomiting.  S/p mitral valve repair 2001.  He does see cardiology, last seen 06/2019, will return to them for cardiac clearance, next appt scheduled 07/05/2020.  Denies recent chest pain,tightness, shortness of breath, dizziness, fevers or cough. No h/o OSA. No daytime somnolence. Has restorative sleep. No witnessed apneic episodes.  Headache, migraine - have improved since retirement. Takes advil or fioricet PRN.   S/p L central retinal artery CVA, cryptogenic as stroke workup unrevealing 06/2019, saw neurology and continues f/u with cardiology.  See below for bladder control issues.   Preventative: COLONOSCOPY 06/2016 diverticulosis, rpt 10 yrs Ardis Hughs) Prostate screen yearly. Nocturia x2-3, post void dribbling.  Lung cancer screening - not eligible Flu yearly Clive 04/2019, 05/2019, 12/2019 Tdap 2015 again 02/2015 Pneumovax 2017, prevnar 2016 Shingrix- declines, unaffordable, continues to consider  Advanced directive discussion -scanned in chart 03/2017.Wife Hoyle Sauer would be HCPOA, then son Marjory Lies and daughter Tilda Burrow.Would want HCPOA to make end of life decisions. Seat belt use discussed Sunscreen use discussed.S/p BCC removed from back 2018 - Dr Ronnald Ramp.  Non smoker  Alcohol - none Dentist q6 mo Eye exam yearly- sees retinologist - lost vision in left eye. Bowel - no diarrhea/constipation Bladder - ongoing for 3 years, random postvoid dribbling. No stress incontinence symptoms. He does have some urge incontinence symptoms. No key in door phenomenon, some foot on floor urgency. Saw urologist 2021 [facility]). Continues daily flomax and tamsulosin. Upset today - he feels concerns haven't been addressed.   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 Interim medical history since our last visit reviewed. Allergies and medications reviewed and updated. Outpatient Medications Prior to Visit  Medication Sig Dispense Refill  . aspirin 81 MG tablet Take 81 mg by mouth daily.    Marland Kitchen ibuprofen (ADVIL) 200 MG tablet Take 400 mg by mouth every 6 (six) hours  as needed for moderate pain.    . Omega-3 Fatty Acids (FISH OIL) 1200 MG CAPS Take 1,200 mg by mouth daily. Reported on 02/23/2015    . butalbital-acetaminophen-caffeine (FIORICET, ESGIC) 50-325-40 MG tablet Take 1 tablet by mouth every 6 (six) hours as needed. (Patient taking differently: Take 1 tablet by mouth every 6 (six) hours as needed for headache or migraine.) 30 tablet 1  . carvedilol (COREG) 6.25 MG tablet  Take 1 tablet (6.25 mg total) by mouth 2 (two) times daily with a meal. 180 tablet 3  . ezetimibe (ZETIA) 10 MG tablet TAKE 1 TABLET(10 MG) BY MOUTH DAILY (Patient taking differently: Take 10 mg by mouth daily. TAKE 1 TABLET(10 MG) BY MOUTH DAILY) 90 tablet 3  . finasteride (PROSCAR) 5 MG tablet Take 1 tablet (5 mg total) by mouth daily. 90 tablet 3  . simvastatin (ZOCOR) 80 MG tablet TAKE 1 TABLET(80 MG) BY MOUTH DAILY (Patient taking differently: Take 80 mg by mouth daily at 6 PM. TAKE 1 TABLET(80 MG) BY MOUTH DAILY) 90 tablet 3  . tamsulosin (FLOMAX) 0.4 MG CAPS capsule Take 1 capsule (0.4 mg total) by mouth at bedtime. 90 capsule 3  . diclofenac Sodium (VOLTAREN) 1 % GEL Apply 2 g topically as needed (knee pain).      No facility-administered medications prior to visit.     Per HPI unless specifically indicated in ROS section below Review of Systems  Constitutional: Negative for activity change, appetite change, chills, fatigue, fever and unexpected weight change.  HENT: Negative for hearing loss.   Eyes: Negative for visual disturbance.  Respiratory: Negative for cough, chest tightness, shortness of breath and wheezing.   Cardiovascular: Negative for chest pain, palpitations and leg swelling.  Gastrointestinal: Positive for abdominal pain (RLQ). Negative for abdominal distention, blood in stool, constipation, diarrhea, nausea and vomiting.  Genitourinary: Negative for difficulty urinating and hematuria.  Musculoskeletal: Negative for arthralgias, myalgias and neck pain.  Skin: Negative for rash.  Neurological: Negative for dizziness, seizures, syncope and headaches.  Hematological: Negative for adenopathy. Does not bruise/bleed easily.  Psychiatric/Behavioral: Negative for dysphoric mood. The patient is not nervous/anxious.    Objective:  BP 128/68   Pulse 60   Temp (!) 97.5 F (36.4 C) (Temporal)   Ht 5\' 10"  (1.778 m)   Wt 237 lb 6 oz (107.7 kg)   SpO2 98%   BMI 34.06 kg/m    Wt Readings from Last 3 Encounters:  05/17/20 237 lb 6 oz (107.7 kg)  05/05/20 235 lb (106.6 kg)  02/11/20 235 lb (106.6 kg)      Physical Exam Vitals and nursing note reviewed.  Constitutional:      General: He is not in acute distress.    Appearance: Normal appearance. He is well-developed. He is not ill-appearing.  HENT:     Head: Normocephalic and atraumatic.     Right Ear: Hearing, tympanic membrane, ear canal and external ear normal.     Left Ear: Hearing, tympanic membrane, ear canal and external ear normal.     Mouth/Throat:     Pharynx: Uvula midline.  Eyes:     General: No scleral icterus.    Extraocular Movements: Extraocular movements intact.     Conjunctiva/sclera: Conjunctivae normal.     Pupils: Pupils are equal, round, and reactive to light.  Neck:     Thyroid: No thyroid mass or thyromegaly.     Vascular: No carotid bruit.  Cardiovascular:     Rate and Rhythm: Normal  rate and regular rhythm.     Pulses: Normal pulses.          Radial pulses are 2+ on the right side and 2+ on the left side.     Heart sounds: Murmur (faint) heard.    Pulmonary:     Effort: Pulmonary effort is normal. No respiratory distress.     Breath sounds: Normal breath sounds. No wheezing, rhonchi or rales.  Abdominal:     General: Bowel sounds are normal. There is no distension.     Palpations: Abdomen is soft. There is no mass.     Tenderness: There is abdominal tenderness (mild) in the right lower quadrant. There is no guarding or rebound.     Hernia: No hernia is present. There is no hernia in the umbilical area, left inguinal area or right inguinal area.  Musculoskeletal:        General: Normal range of motion.     Cervical back: Normal range of motion and neck supple.     Right lower leg: No edema.     Left lower leg: No edema.  Lymphadenopathy:     Cervical: No cervical adenopathy.  Skin:    General: Skin is warm and dry.     Findings: No rash.  Neurological:      General: No focal deficit present.     Mental Status: He is alert and oriented to person, place, and time.     Comments: CN grossly intact, station and gait intact  Psychiatric:        Mood and Affect: Mood normal.        Behavior: Behavior normal.        Thought Content: Thought content normal.        Judgment: Judgment normal.       Results for orders placed or performed in visit on 05/17/20  POCT Urinalysis Dipstick (Automated)  Result Value Ref Range   Color, UA yellow    Clarity, UA clear    Glucose, UA Negative Negative   Bilirubin, UA negative    Ketones, UA negative    Spec Grav, UA >=1.030 (A) 1.010 - 1.025   Blood, UA negative    pH, UA 5.5 5.0 - 8.0   Protein, UA Negative Negative   Urobilinogen, UA 0.2 0.2 or 1.0 E.U./dL   Nitrite, UA negative    Leukocytes, UA Negative Negative   Lab Results  Component Value Date   CHOL 132 05/13/2020   HDL 46.40 05/13/2020   LDLCALC 66 05/13/2020   TRIG 95.0 05/13/2020   CHOLHDL 3 05/13/2020    Lab Results  Component Value Date   HGBA1C 6.1 05/13/2020    Lab Results  Component Value Date   CREATININE 1.26 05/13/2020   BUN 21 05/13/2020   NA 141 05/13/2020   K 4.3 05/13/2020   CL 104 05/13/2020   CO2 29 05/13/2020    Lab Results  Component Value Date   ALT 35 05/13/2020   AST 15 05/13/2020   ALKPHOS 53 05/13/2020   BILITOT 0.6 05/13/2020    Lab Results  Component Value Date   TSH 2.99 05/08/2019    Lab Results  Component Value Date   PSA1 0.7 01/08/2020   PSA 0.60 05/13/2020   PSA 0.59 05/08/2019   PSA 0.78 05/10/2018  DG Chest 2 View CLINICAL DATA:  Preop  EXAM: CHEST - 2 VIEW  COMPARISON:  06/20/2017  FINDINGS: Mild cardiomegaly. Loop recorder projects over the left chest. No  confluent airspace opacities, effusions or edema. No acute bony abnormality. Prior median sternotomy.  IMPRESSION: Mild cardiomegaly.  No active disease.  Electronically Signed   By: Rolm Baptise M.D.   On:  05/17/2020 10:56   Assessment & Plan:  This visit occurred during the SARS-CoV-2 public health emergency.  Safety protocols were in place, including screening questions prior to the visit, additional usage of staff PPE, and extensive cleaning of exam room while observing appropriate contact time as indicated for disinfecting solutions.   Problem List Items Addressed This Visit    Dyslipidemia    Chronic, stable on high dose simvastatin + zetia, also takes low dose fish oil.  The ASCVD Risk score Mikey Bussing DC Jr., et al., 2013) failed to calculate for the following reasons:   The patient has a prior MI or stroke diagnosis       Relevant Medications   ezetimibe (ZETIA) 10 MG tablet   simvastatin (ZOCOR) 80 MG tablet   Benign prostatic hyperplasia with lower urinary tract symptoms    PSA remains stable. Check UA today. Continues finasteride and flomax - but with ongoing bothersome LUTS of nocturia and post-void dribbling and mild urge incontinence. Will increase flomax to 0.8mg  nightly. Also reviewed fluid intake - he does drink 20 oz caffeinated beverage daily - rec transition some of that to plain water, limit drinking after 7pm. Update with effect of higher flomax, if no improvement, consider myrbetriq trial vs return to urology.  IPSS = 18-3. Moderate score.       Relevant Medications   finasteride (PROSCAR) 5 MG tablet   tamsulosin (FLOMAX) 0.4 MG CAPS capsule   Migraines    Improved after retirement.       Relevant Medications   butalbital-acetaminophen-caffeine (FIORICET) 50-325-40 MG tablet   carvedilol (COREG) 6.25 MG tablet   ezetimibe (ZETIA) 10 MG tablet   simvastatin (ZOCOR) 80 MG tablet   Status post mitral valve repair   Health maintenance examination - Primary    Preventative protocols reviewed and updated unless pt declined. Discussed healthy diet and lifestyle.       Primary osteoarthritis of left knee    Upcoming L knee replacement.       Relevant Medications    butalbital-acetaminophen-caffeine (FIORICET) 50-325-40 MG tablet   Central retinal artery occlusion of left eye    Cryptogenic retinal stroke, vision loss persists      Relevant Medications   carvedilol (COREG) 6.25 MG tablet   ezetimibe (ZETIA) 10 MG tablet   simvastatin (ZOCOR) 80 MG tablet   Pre-op evaluation    RCRI = 1 (prior h/o stroke) Check CXR today along with UA.  Has tolerated anesthesia well in the past.  Anticipate adequately low risk to proceed with surgery - will clear from medical standpoint. Do recommend f/u with cardiologist for cardiac clearance.       Relevant Orders   DG Chest 2 View (Completed)   POCT Urinalysis Dipstick (Automated) (Completed)       Meds ordered this encounter  Medications  . butalbital-acetaminophen-caffeine (FIORICET) 50-325-40 MG tablet    Sig: Take 1 tablet by mouth every 6 (six) hours as needed for headache or migraine.    Dispense:  30 tablet    Refill:  1  . carvedilol (COREG) 6.25 MG tablet    Sig: Take 1 tablet (6.25 mg total) by mouth 2 (two) times daily with a meal.    Dispense:  180 tablet    Refill:  3  .  ezetimibe (ZETIA) 10 MG tablet    Sig: TAKE 1 TABLET(10 MG) BY MOUTH DAILY    Dispense:  90 tablet    Refill:  3  . finasteride (PROSCAR) 5 MG tablet    Sig: Take 1 tablet (5 mg total) by mouth daily.    Dispense:  90 tablet    Refill:  3  . simvastatin (ZOCOR) 80 MG tablet    Sig: TAKE 1 TABLET(80 MG) BY MOUTH DAILY    Dispense:  90 tablet    Refill:  3  . tamsulosin (FLOMAX) 0.4 MG CAPS capsule    Sig: Take 2 capsules (0.8 mg total) by mouth at bedtime.    Dispense:  180 capsule    Refill:  3    Note new sig   Orders Placed This Encounter  Procedures  . DG Chest 2 View    Standing Status:   Future    Number of Occurrences:   1    Standing Expiration Date:   05/17/2021    Order Specific Question:   Reason for Exam (SYMPTOM  OR DIAGNOSIS REQUIRED)    Answer:   preop eval    Order Specific Question:    Preferred imaging location?    Answer:   [facility]  . POCT Urinalysis Dipstick (Automated)    Patient instructions: Chest xray today. Urinalysis today.  See your heart doctor for cardiac clearance.  Increase flomax to 2 pills at night time - update me with effect on urine symptoms in 2-3 weeks, if ongoing trouble we may add another urine medicine called Myrbetriq.  Return as needed or in 1 year for next physical/wellness visit   Follow up plan: Return in about 1 year (around 05/17/2021), or if symptoms worsen or fail to improve, for annual exam, prior fasting for blood work, medicare wellness visit.  Ria Bush, MD

## 2020-05-17 NOTE — Telephone Encounter (Signed)
Patient was calling to check the status of Medical clearance. Patient called the  Wrong office.

## 2020-05-17 NOTE — Patient Instructions (Addendum)
Chest xray today. Urinalysis today.  See your heart doctor for cardiac clearance.  Increase flomax to 2 pills at night time - update me with effect on urine symptoms in 2-3 weeks, if ongoing trouble we may add another urine medicine called Myrbetriq.  Return as needed or in 1 year for next physical/wellness visit   Health Maintenance After Age 71 After age 4, you are at a higher risk for certain long-term diseases and infections as well as injuries from falls. Falls are a major cause of broken bones and head injuries in people who are older than age 78. Getting regular preventive care can help to keep you healthy and well. Preventive care includes getting regular testing and making lifestyle changes as recommended by your health care provider. Talk with your health care provider about:  Which screenings and tests you should have. A screening is a test that checks for a disease when you have no symptoms.  A diet and exercise plan that is right for you. What should I know about screenings and tests to prevent falls? Screening and testing are the best ways to find a health problem early. Early diagnosis and treatment give you the best chance of managing medical conditions that are common after age 61. Certain conditions and lifestyle choices may make you more likely to have a fall. Your health care provider may recommend:  Regular vision checks. Poor vision and conditions such as cataracts can make you more likely to have a fall. If you wear glasses, make sure to get your prescription updated if your vision changes.  Medicine review. Work with your health care provider to regularly review all of the medicines you are taking, including over-the-counter medicines. Ask your health care provider about any side effects that may make you more likely to have a fall. Tell your health care provider if any medicines that you take make you feel dizzy or sleepy.  Osteoporosis screening. Osteoporosis is a  condition that causes the bones to get weaker. This can make the bones weak and cause them to break more easily.  Blood pressure screening. Blood pressure changes and medicines to control blood pressure can make you feel dizzy.  Strength and balance checks. Your health care provider may recommend certain tests to check your strength and balance while standing, walking, or changing positions.  Foot health exam. Foot pain and numbness, as well as not wearing proper footwear, can make you more likely to have a fall.  Depression screening. You may be more likely to have a fall if you have a fear of falling, feel emotionally low, or feel unable to do activities that you used to do.  Alcohol use screening. Using too much alcohol can affect your balance and may make you more likely to have a fall. What actions can I take to lower my risk of falls? General instructions  Talk with your health care provider about your risks for falling. Tell your health care provider if: ? You fall. Be sure to tell your health care provider about all falls, even ones that seem minor. ? You feel dizzy, sleepy, or off-balance.  Take over-the-counter and prescription medicines only as told by your health care provider. These include any supplements.  Eat a healthy diet and maintain a healthy weight. A healthy diet includes low-fat dairy products, low-fat (lean) meats, and fiber from whole grains, beans, and lots of fruits and vegetables. Home safety  Remove any tripping hazards, such as rugs, cords, and clutter.  Install safety equipment such as grab bars in bathrooms and safety rails on stairs.  Keep rooms and walkways well-lit. Activity  Follow a regular exercise program to stay fit. This will help you maintain your balance. Ask your health care provider what types of exercise are appropriate for you.  If you need a cane or walker, use it as recommended by your health care provider.  Wear supportive shoes that  have nonskid soles.   Lifestyle  Do not drink alcohol if your health care provider tells you not to drink.  If you drink alcohol, limit how much you have: ? 0-1 drink a day for women. ? 0-2 drinks a day for men.  Be aware of how much alcohol is in your drink. In the [place] one drink equals one typical bottle of beer (12 oz), one-half glass of wine (5 oz), or one shot of hard liquor (1 oz).  Do not use any products that contain nicotine or tobacco, such as cigarettes and e-cigarettes. If you need help quitting, ask your health care provider. Summary  Having a healthy lifestyle and getting preventive care can help to protect your health and wellness after age 57.  Screening and testing are the best way to find a health problem early and help you avoid having a fall. Early diagnosis and treatment give you the best chance for managing medical conditions that are more common for people who are older than age 76.  Falls are a major cause of broken bones and head injuries in people who are older than age 82. Take precautions to prevent a fall at home.  Work with your health care provider to learn what changes you can make to improve your health and wellness and to prevent falls. This information is not intended to replace advice given to you by your health care provider. Make sure you discuss any questions you have with your health care provider. Document Revised: 06/13/2018 Document Reviewed: 01/03/2017 Elsevier Patient Education  2021 [employer].

## 2020-05-18 DIAGNOSIS — Z01818 Encounter for other preprocedural examination: Secondary | ICD-10-CM | POA: Insufficient documentation

## 2020-05-18 NOTE — Assessment & Plan Note (Addendum)
Upcoming L knee replacement.

## 2020-05-18 NOTE — Assessment & Plan Note (Signed)
Preventative protocols reviewed and updated unless pt declined. Discussed healthy diet and lifestyle.  

## 2020-05-18 NOTE — Assessment & Plan Note (Signed)
Cryptogenic retinal stroke, vision loss persists

## 2020-05-18 NOTE — Assessment & Plan Note (Addendum)
PSA remains stable. Check UA today. Continues finasteride and flomax - but with ongoing bothersome LUTS of nocturia and post-void dribbling and mild urge incontinence. Will increase flomax to 0.8mg  nightly. Also reviewed fluid intake - he does drink 20 oz caffeinated beverage daily - rec transition some of that to plain water, limit drinking after 7pm. Update with effect of higher flomax, if no improvement, consider myrbetriq trial vs return to urology.  IPSS = 18-3. Moderate score.

## 2020-05-18 NOTE — Assessment & Plan Note (Signed)
Chronic, stable on high dose simvastatin + zetia, also takes low dose fish oil.  The ASCVD Risk score Mikey Bussing DC Jr., et al., 2013) failed to calculate for the following reasons:   The patient has a prior MI or stroke diagnosis

## 2020-05-18 NOTE — Assessment & Plan Note (Addendum)
RCRI = 1 (prior h/o stroke) Check CXR today along with UA.  Has tolerated anesthesia well in the past.  Anticipate adequately low risk to proceed with surgery - will clear from medical standpoint. Do recommend f/u with cardiologist for cardiac clearance.

## 2020-05-18 NOTE — Assessment & Plan Note (Deleted)
Pending L knee replacement.

## 2020-05-18 NOTE — Assessment & Plan Note (Signed)
Improved after retirement.

## 2020-05-25 ENCOUNTER — Telehealth: Payer: Self-pay

## 2020-05-25 NOTE — Telephone Encounter (Signed)
   [facility] Pre-operative Risk Assessment    HEARTCARE STAFF: - Please ensure there is not already an duplicate clearance open for this procedure. - Under Visit Info/Reason for Call, type in Other and utilize the format Clearance MM/DD/YY or Clearance TBD. Do not use dashes or single digits. - If request is for dental extraction, please clarify the # of teeth to be extracted.  Request for surgical clearance:  1. What type of surgery is being performed? Left Total Knee   2. When is this surgery scheduled? TBD   3. What type of clearance is required (medical clearance vs. Pharmacy clearance to hold med vs. Both)?    4. Are there any medications that need to be held prior to surgery and how long? ASA   5. Practice name and name of physician performing surgery? [facility], Dr. Joni Fears   6. What is the office phone number? [phone]   7.   What is the office fax number? [phone]  8.   Anesthesia type (None, local, MAC, general) ? Choice   Jacqulynn Cadet 05/25/2020, 10:07 AM  _________________________________________________________________   (provider comments below)

## 2020-05-25 NOTE — Telephone Encounter (Signed)
Called patient Alec Maldonado and explained reason for call. Patient is now scheduled to see Coletta Memos, NP on 06/06/20 at 8:45 AM. Patient verbalized understanding and thanked me for calling.

## 2020-05-25 NOTE — Telephone Encounter (Addendum)
Called the requesting office and left a detailed message for the surgery scheduler with the date and time for patient's appointment for clearance.

## 2020-05-25 NOTE — Telephone Encounter (Signed)
Primary Cardiologist:Alec Harrell Gave, MD  Chart reviewed as part of pre-operative protocol coverage. Because of Alec Maldonado's past medical history and time since last visit, he/she will require a follow-up visit in order to better assess preoperative cardiovascular risk.  Pre-op covering staff: - Please schedule appointment and call patient to inform them. - Please contact requesting surgeon's office via preferred method (i.e, phone, fax) to inform them of need for appointment prior to surgery.  If applicable, this message will also be routed to pharmacy pool and/or primary cardiologist for input on holding anticoagulant/antiplatelet agent as requested below so that this information is available at time of patient's appointment.   Deberah Pelton, NP  05/25/2020, 10:16 AM

## 2020-05-30 LAB — CUP PACEART REMOTE DEVICE CHECK
Date Time Interrogation Session: 20220322230253
Implantable Pulse Generator Implant Date: 20210414

## 2020-05-31 ENCOUNTER — Ambulatory Visit (INDEPENDENT_AMBULATORY_CARE_PROVIDER_SITE_OTHER): Payer: Medicare Other

## 2020-05-31 DIAGNOSIS — I63449 Cerebral infarction due to embolism of unspecified cerebellar artery: Secondary | ICD-10-CM | POA: Diagnosis not present

## 2020-06-08 ENCOUNTER — Telehealth: Payer: Self-pay

## 2020-06-08 ENCOUNTER — Telehealth: Payer: Self-pay | Admitting: Orthopaedic Surgery

## 2020-06-08 NOTE — Telephone Encounter (Signed)
Patient called to say he will be going for his cardiac clearance on 06-16-20 and has asked that we hold April 26th for him.  He will see Aaron Edelman for History & Physical on 19th of April @ 1pm.  The current surgery sheet indicates patient  is having a Left total knee arthroplasty HOWEVER patient states he would like to have surgery on the RIGHT KNEE first because it is giving him more of a problem than the left.    Please provide a new surgery sheet if Dr. Durward Fortes agrees with this decision.       AGAIN--PATIENT DOES WANT TO HAVE RIGHT KNEE DONE FIRST.    Pt's cb  [phone]

## 2020-06-08 NOTE — Telephone Encounter (Signed)
Error

## 2020-06-14 NOTE — Progress Notes (Signed)
[facility] Summary Report / Loop Recorder 

## 2020-06-14 NOTE — Telephone Encounter (Signed)
I need to see him this week-repeat films of right knee

## 2020-06-14 NOTE — Progress Notes (Signed)
Cardiology Clinic Note   Patient Name: Alec Maldonado Date of Encounter: 06/16/2020  Primary Care Provider:  Ria Bush, MD Primary Cardiologist:  Buford Dresser, MD  Patient Profile    Alec Maldonado 70 year old male presents the clinic today for follow-up evaluation of his LVH, paroxysmal SVT, and preoperative cardiac evaluation  Past Medical History    Past Medical History:  Diagnosis Date  . Allergy   . Aortic insufficiency    Mild, echo, January, 2014  . BCC (basal cell carcinoma), back 2017   s/p excisional biospy by derm  . BPH (benign prostatic hypertrophy)   . Chest discomfort    nuclear 06/24/09, suggestion prior small inferior MI with moderate peri-infarvt ischemia versus variable diaphragmatic attenuation  . Colon polyps    colonoscopy 07/02/06, repeat in 10 years  . Dyslipidemia   . Ejection fraction    EF 55%, echo, October, 2010  //   EF 45-50%,  catheterization, 2011  . Heart murmur   . Hyperlipidemia   . Migraines   . Mitral regurgitation    Mitral valve repair 2001  . Neurofibroma of foot 04/2017   s/p removal by derm  . Palpitations 07/2009    helped with carvedilol   . Pleurisy   . Postural dizziness    mild, no orthostatic changes by blood pressure check  . Pre-syncope    January, 2014  . RBBB    First seen January, 2014  . Right ventricular dysfunction    Mild/moderate right ventricular dysfunction by echo, January, 2014, no tricuspid regurgitation, therefore right heart pressure could not be estimated.  . Shortness of breath   . Status post mitral valve repair    2001,  //  echo, October, 2010, good function of the repaired mitral valve  . Stroke ([place])   . SVT (supraventricular tachycardia) ([place]) 03/11/2013   Isolated episodes - last 03/2013 after flu treated with adenosine cardioversion in ER.   Marland Kitchen Thyroid disease    Past Surgical History:  Procedure Laterality Date  . aneurysmal dilatation     proximal LAD cath 2001, reoeat  catheter 2004, no significant change  . aneurysmal dilatiation  07/15/09   no change, normal right heart filling pressures and LVEDP- EF 45-50%  . APPENDECTOMY  1970  . BUBBLE STUDY  06/18/2019   Procedure: BUBBLE STUDY;  Surgeon: Sueanne Margarita, MD;  Location: [facility] ENDOSCOPY;  Service: Cardiovascular;;  . CARDIAC CATHETERIZATION  2010   mild global LV dysfunction EF 45-50%, no significant CAD obstruction, aneurysmal dilatation LAD  . COLONOSCOPY  2008  . COLONOSCOPY  06/2016   diverticulosis, rpt 10 yrs Ardis Hughs)  . CORONARY ANGIOPLASTY  09/1999   severe MR o/w normal limits  . CORONARY ANGIOPLASTY  12/19/02   pos. CAD, mild LV dysfunction- med treatment  . EYE SURGERY Left   . HERNIA REPAIR  01/11/05   left, Dr. Hassell Done  . I & D EXTREMITY Right 02/04/2015   Procedure: IRRIGATION AND DEBRIDEMENT RIGHT INDEX FINGER OPEN FRACTURE AND REPAIR DIGITAL NERVE AND ARTERY;  Surgeon: Leanora Cover, MD;  Location: [place];  Service: Orthopedics;  Laterality: Right;  . LOOP RECORDER INSERTION N/A 06/18/2019   Procedure: LOOP RECORDER INSERTION;  Surgeon: Deboraha Sprang, MD;  Location: [facility];  Service: Cardiovascular;  Laterality: N/A;  . MITRAL VALVE REPAIR  01/22/00   good mitral valve function- echo 12/2008- EF 55% / nuclear 06/2009- EF 36%,? reliable? / EF 45-50%, cath rated 02/2010  .  ORIF ELBOW FRACTURE     left  . POLYPECTOMY    . TEE WITHOUT CARDIOVERSION N/A 06/18/2019   Procedure: TRANSESOPHAGEAL ECHOCARDIOGRAM (TEE);  Surgeon: Sueanne Margarita, MD;  Location: [facility] ENDOSCOPY;  Service: Cardiovascular;  Laterality: N/A;  . THYROID SURGERY    . THYROIDECTOMY     left, due to goiter 1994    Allergies  No Known Allergies  History of Present Illness    Alec Maldonado is a PMH of CVA, status post mitral valve repair, HLD, SVT, and LVH.  He was previously told that he would not regain vision after his retinal occlusion.  He reported he has had 3 episodes of SVT.  1 stopped on its own, on was  converted with adenosine, and 1 converted with a maneuvers.  He has no history of atrial fibrillation he underwent cardiac MRI 08/11/2019 which showed normal LV size and wall thickness with no evidence of hypertrophic cardiomyopathy EF 54% mild mid inferior lateral hypokinesis with septal bounce consistent with prior surgery.  Mildly elevated RV with mildly decreased systolic function status post mitral valve repair with minimal MR.  No myocardial LGE, so no evidence of definitive evidence of prior MI, myocarditis or infiltrative disease.  He was seen by Dr. Harrell Gave 07/02/2019.  During that time he denied chest pain, shortness of breath, PND, orthopnea, lower extremity edema and expected weight gain.  He denied syncope.  He was very active prior to hospitalization splitting wood and doing yard work.  He denied issues with his medications.  He reported occasionally having cramps.  Previously his simvastatin was reviewed for switching therapy however, he continued simvastatin.  He reported bruising more easily while on Plavix.  He denied other bleeding issues.  He reports the clinic today for follow-up evaluation and preoperative cardiac evaluation.  He states he feels well.  He continues to be very physically active doing yard work and cutting wood.  He reports that his right knee recently has been giving him more problems and he would like to have his right knee replaced prior to having his left knee replaced.  He recently had a gas furnace installed on his home because he is not able to be as active cutting wood.  He reports that he does have some salt to his food but is very little.  I will continue his current medication regimen.  We will give him the salty 6 diet sheet, have him maintain his current physical activity and follow-up in 12 months.  Today he denies chest pain, shortness of breath, lower extremity edema, fatigue, palpitations, melena, hematuria, hemoptysis, diaphoresis, weakness, presyncope,  syncope, orthopnea, and PND.     Home Medications    Prior to Admission medications   Medication Sig Start Date End Date Taking? Authorizing Provider  aspirin 81 MG tablet Take 81 mg by mouth daily.    [provider]  butalbital-acetaminophen-caffeine (FIORICET) 50-325-40 MG tablet Take 1 tablet by mouth every 6 (six) hours as needed for headache or migraine. 05/17/20   Ria Bush, MD  carvedilol (COREG) 6.25 MG tablet Take 1 tablet (6.25 mg total) by mouth 2 (two) times daily with a meal. 05/17/20   Ria Bush, MD  ezetimibe (ZETIA) 10 MG tablet TAKE 1 TABLET(10 MG) BY MOUTH DAILY 05/17/20   Ria Bush, MD  finasteride (PROSCAR) 5 MG tablet Take 1 tablet (5 mg total) by mouth daily. 05/17/20   Ria Bush, MD  ibuprofen (ADVIL) 200 MG tablet Take 400 mg by mouth  every 6 (six) hours as needed for moderate pain.    [provider]  Omega-3 Fatty Acids (FISH OIL) 1200 MG CAPS Take 1,200 mg by mouth daily. Reported on 02/23/2015    [provider]  simvastatin (ZOCOR) 80 MG tablet TAKE 1 TABLET(80 MG) BY MOUTH DAILY 05/17/20   Ria Bush, MD  tamsulosin (FLOMAX) 0.4 MG CAPS capsule Take 2 capsules (0.8 mg total) by mouth at bedtime. 05/17/20   Ria Bush, MD    Family History    Family History  Problem Relation Age of Onset  . Alzheimer's disease Mother   . Heart disease Mother        CHF  . Arthritis Father   . Heart disease Father        leaking mitral valve  . Benign prostatic hyperplasia Father   . Heart disease Brother        murmur  . Hypertension Neg Hx   . Diabetes Neg Hx   . Depression Neg Hx   . Alcohol abuse Neg Hx   . Drug abuse Neg Hx   . Stroke Neg Hx   . Cancer Neg Hx   . Colon cancer Neg Hx   . Esophageal cancer Neg Hx   . Rectal cancer Neg Hx   . Stomach cancer Neg Hx    He indicated that his mother is deceased. He indicated that his father is deceased. He indicated that his brother is deceased.  He indicated that the status of his neg hx is unknown.  Social History    Social History   Socioeconomic History  . Marital status: Married    Spouse name: Not on file  . Number of children: 2  . Years of education: Not on file  . Highest education level: Not on file  Occupational History  . Occupation: Artist  Tobacco Use  . Smoking status: Never Smoker  . Smokeless tobacco: Never Used  Vaping Use  . Vaping Use: Never used  Substance and Sexual Activity  . Alcohol use: No  . Drug use: No  . Sexual activity: Yes  Other Topics Concern  . Not on file  Social History Narrative   Widowed, wife deceased colon cancer August 09, 1996, remarried 2001, lives with remarried wife   Occupation: retired, worked for [employer], site   Activity: walking 30-68min 4-5d/wk   Diet: good water, fruits/vegetables daily   Social Determinants of Radio broadcast assistant Strain: Low Risk   . Difficulty of Paying Living Expenses: Not hard at all  Food Insecurity: No Food Insecurity  . Worried About Charity fundraiser in the Last Year: Never true  . Ran Out of Food in the Last Year: Never true  Transportation Needs: No Transportation Needs  . Lack of Transportation (Medical): No  . Lack of Transportation (Non-Medical): No  Physical Activity: Inactive  . Days of Exercise per Week: 0 days  . Minutes of Exercise per Session: 0 min  Stress: No Stress Concern Present  . Feeling of Stress : Not at all  Social Connections: Not on file  Intimate Partner Violence: Not At Risk  . Fear of Current or Ex-Partner: No  . Emotionally Abused: No  . Physically Abused: No  . Sexually Abused: No     Review of Systems    General:  No chills, fever, night sweats or weight changes.  Cardiovascular:  No chest pain, dyspnea on exertion, edema, orthopnea, palpitations, paroxysmal nocturnal dyspnea. Dermatological: No rash,  lesions/masses Respiratory: No cough, dyspnea Urologic: No hematuria,  dysuria Abdominal:   No nausea, vomiting, diarrhea, bright red blood per rectum, melena, or hematemesis Neurologic:  No visual changes, wkns, changes in mental status. All other systems reviewed and are otherwise negative except as noted above.  Physical Exam    VS:  BP 132/84 (BP Location: Left Arm, Patient Position: Sitting)   Pulse 69   Ht 5' 10.5" (1.791 m)   Wt 238 lb 12.8 oz (108.3 kg)   SpO2 96%   BMI 33.78 kg/m  , BMI Body mass index is 33.78 kg/m. GEN: Well nourished, well developed, in no acute distress. HEENT: normal. Neck: Supple, no JVD, carotid bruits, or masses. Cardiac: RRR, no murmurs, rubs, or gallops. No clubbing, cyanosis, edema.  Radials/DP/PT 2+ and equal bilaterally.  Respiratory:  Respirations regular and unlabored, clear to auscultation bilaterally. GI: Soft, nontender, nondistended, BS + x 4. MS: no deformity or atrophy. Skin: warm and dry, no rash. Neuro:  Strength and sensation are intact. Psych: Normal affect.  Accessory Clinical Findings    Recent Labs: 06/18/2019: Hemoglobin 12.2; Magnesium 2.0; Platelets 256 05/13/2020: ALT 35; BUN 21; Creatinine, Ser 1.26; Potassium 4.3; Sodium 141   Recent Lipid Panel    Component Value Date/Time   CHOL 132 05/13/2020 0755   TRIG 95.0 05/13/2020 0755   HDL 46.40 05/13/2020 0755   CHOLHDL 3 05/13/2020 0755   VLDL 19.0 05/13/2020 0755   LDLCALC 66 05/13/2020 0755    ECG personally reviewed by me today-normal sinus rhythm right from branch block 69 bpm- No acute changes  Echocardiogram 06/18/2019 IMPRESSIONS    1. Left ventricular ejection fraction, by estimation, is 50 to 55%. The  left ventricle has low normal function. The left ventricle has no regional  wall motion abnormalities.  2. Right ventricular systolic function is normal. The right ventricular  size is normal.  3. Left atrial size was severely dilated. No left atrial/left atrial  appendage thrombus was detected.  4. S/P MV repair. The  MV is degenerative with thickened and calcified  leaflets. There is some restricted leaflet motion but no mitral stenosis  with mean MVG of 39mmHg. There is mild mitral regurgitation.  5. The aortic valve is tricuspid. Aortic valve regurgitation is mild.  Mild aortic valve sclerosis is present, with no evidence of aortic valve  stenosis.  6. The inferior vena cava is normal in size with greater than 50%  respiratory variability, suggesting right atrial pressure of 3 mmHg.  7. The interatrial septum appears to be lipomatous. No atrial level shunt  detected by color flow Doppler. Agitated saline contrast was given  intravenously to evaluate for intracardiac shunting. There is no evidence  of a patent foramen ovale. There is no  evidence of an atrial septal defect.   Conclusion(s)/Recommendation(s): No source of embolism noted.  Cardiac MRI 08/11/2019 IMPRESSION: 1. Normal LV size and wall thickness, no evidence for hypertrophic cardiomyopathy. EF 54%, mild mid inferolateral hypokinesis with septal bounce consistent with prior surgery.  2. Mildly dilated RV with mildly decreased systolic function, EF 81%.  3.  S/p mitral valve repair with minimal MR noted.  4. No myocardial LGE, so no definitive evidence for prior MI, myocarditis, or infiltrative disease.  Dalton Mclean   Electronically Signed   By: Loralie Champagne M.D.   On: 08/11/2019 12:24  Assessment & Plan   1.  Paroxysmal SVT-denies recent episodes of irregular heartbeat or accelerated heartbeat.  Linq device last interrogated 05/25/2020  showed sinus tach with PVCs, battery status okay, normal device function Continue carvedilol Follows with EP  Left ventricular hypertrophy-cardiac MRI showed normal LV size and wall thickness with no evidence of hypertrophic cardiomyopathy.  Details above. Continue carvedilol Heart healthy low-sodium diet  Cryptogenic stroke-neurologically intact.  No recent episodes of vision  change, weakness, presyncope, syncope.  Linq device in place and functioning well. Continue aspirin, clopidogrel, simvastatin Heart healthy low-sodium diet-salty 6 given Increase physical activity as tolerated  Preoperative cardiac evaluation-right total knee replacement, Ortho care Dr. Joni Fears     Primary Cardiologist: Buford Dresser, MD  Chart reviewed as part of pre-operative protocol coverage. Given past medical history and time since last visit, based on ACC/AHA guidelines, OSAMU OLGUIN would be at acceptable risk for the planned procedure without further cardiovascular testing.   His RCRI is a class II risk, 0.9% risk of major cardiac event.  He is able to complete greater than 4 METs of physical activity.  Patient was advised that if he develops new symptoms prior to surgery to contact our office to arrange a follow-up appointment.  He verbalized understanding.  I will route this recommendation to the requesting party via Epic fax function and remove from pre-op pool.  Please call with questions.   Disposition: Follow-up with Dr. Harrell Gave in 12 months.  Alec Ng. Kashon Kraynak NP-C    06/16/2020, 9:03 AM [place] [address] Office [phone] Fax [phone]  Notice: This dictation was prepared with Dragon dictation along with smaller phrase technology. Any transcriptional errors that result from this process are unintentional and may not be corrected upon review.  I spent 12 minutes examining this patient, reviewing medications, and using patient centered shared decision making involving her cardiac care.  Prior to her visit I spent greater than 20 minutes reviewing her past medical history,  medications, and prior cardiac tests.

## 2020-06-14 NOTE — Telephone Encounter (Signed)
Tried to call patient. No answer. Left message that we need to see him in the office this week. Ask him to please call back so that we can get him scheduled and x-rayed.

## 2020-06-16 ENCOUNTER — Encounter: Payer: Self-pay | Admitting: General Practice

## 2020-06-16 ENCOUNTER — Other Ambulatory Visit: Payer: Self-pay

## 2020-06-16 ENCOUNTER — Ambulatory Visit: Payer: Medicare Other | Admitting: General Practice

## 2020-06-16 VITALS — BP 132/84 | HR 69 | Ht 70.5 in | Wt 238.8 lb

## 2020-06-16 DIAGNOSIS — I517 Cardiomegaly: Secondary | ICD-10-CM

## 2020-06-16 DIAGNOSIS — I63449 Cerebral infarction due to embolism of unspecified cerebellar artery: Secondary | ICD-10-CM | POA: Diagnosis not present

## 2020-06-16 DIAGNOSIS — I471 Supraventricular tachycardia: Secondary | ICD-10-CM | POA: Diagnosis not present

## 2020-06-16 DIAGNOSIS — Z01810 Encounter for preprocedural cardiovascular examination: Secondary | ICD-10-CM | POA: Diagnosis not present

## 2020-06-16 NOTE — Patient Instructions (Addendum)
Medication Instructions:  The current medical regimen is effective;  continue present plan and medications as directed. Please refer to the Current Medication list given to you today.  *If you need a refill on your cardiac medications before your next appointment, please call your pharmacy*  Special Instructions PLEASE READ AND FOLLOW SALTY 6-ATTACHED-1,800mg  daily  PLEASE MAINTAIN PHYSICAL ACTIVITY AS TOLERATED  Follow-Up: Your next appointment:  12 month(s) In Person with You may see Buford Dresser, MD IF UNAVAILABLE JESSE CLEAVER, FNP-C or one of the following Advanced Practice Providers on your designated Care Team:  Rosaria Ferries, PA-C  Jory Sims, DNP, ANP  Please call our office 2 months in advance to schedule this appointment   At [facility], you and your health needs are our priority.  As part of our continuing mission to provide you with exceptional heart care, we have created designated Provider Care Teams.  These Care Teams include your primary Cardiologist (physician) and Advanced Practice Providers (APPs -  Physician Assistants and Nurse Practitioners) who all work together to provide you with the care you need, when you need it.            6 SALTY THINGS TO AVOID     1,800MG  DAILY

## 2020-06-17 ENCOUNTER — Telehealth: Payer: Self-pay | Admitting: *Deleted

## 2020-06-17 ENCOUNTER — Ambulatory Visit: Payer: Medicare Other | Admitting: Orthopaedic Surgery

## 2020-06-17 ENCOUNTER — Encounter: Payer: Self-pay | Admitting: Orthopaedic Surgery

## 2020-06-17 ENCOUNTER — Ambulatory Visit: Payer: Self-pay

## 2020-06-17 VITALS — BP 116/74 | HR 81 | Ht 70.5 in | Wt 239.0 lb

## 2020-06-17 DIAGNOSIS — M25561 Pain in right knee: Secondary | ICD-10-CM | POA: Diagnosis not present

## 2020-06-17 DIAGNOSIS — G8929 Other chronic pain: Secondary | ICD-10-CM | POA: Diagnosis not present

## 2020-06-17 NOTE — Care Plan (Signed)
Patient seen in office today during his H&P appointment with Biagio Borg, PA-C for Dr. Durward Fortes. Patient is having a right total knee replacement done on 06/29/20 at [facility] and is an Ortho bundle patient through [facility]. Provided all post op care instructions and reviewed. He is planning on returning home with his wife helping after discharge. Referral will be made for CPM through [place]. He has a FWW and 3in1 already in the home. No other DME needed. Anticipate a few visits of HHPT after discharge. After choice provided, referral made to [facility] (formerly [facility] Patient has a f/u with Dr. Durward Fortes scheduled for 2 weeks post-op on 07/13/20. He wishes for [facility] [place] to be his OPPT facility. Referral will be made by office CM. Will continue to follow for needs.

## 2020-06-17 NOTE — Progress Notes (Signed)
Office Visit Note   Patient: Alec Maldonado           Date of Birth: [birth year]           MRN: [id number] Visit Date: 06/17/2020              Alec Fears, MD   Alec Borg, PA-C [address], [place], [place]  [address]                             [phone]   [place]  [name] MRN:  [id number] DOB/SEX:  1950-04-01/male  CHIEF COMPLAINT:  Painful right Knee  HISTORY: Patient is a 70 y.o. male presented with a history of pain in the right knee for 1 years. Onset of symptoms was gradual starting 2 years ago with rapidly worsening course since that time. Prior procedures on the knee are none. Patient has been treated conservatively with over-the-counter NSAIDs and activity modification. Patient currently rates pain in the knee at 9 out of 10 with activity. There is pain at night. present.  They have been previously treated with: NSAIDS: Ibuprofen with no improvement  Knee injection with corticosteroid  was performed Knee injection with visco supplementation was performed Medications: Ibuprofen with mild improvement  PAST MEDICAL HISTORY: Patient Active Problem List   Diagnosis Date Noted  . Pre-op evaluation 05/18/2020  . Bilateral primary osteoarthritis of knee 02/11/2020  . Posterior vitreous detachment, both eyes 01/13/2020  . Acute right-sided low back pain 10/19/2019  . Bowel habit changes 10/19/2019  . Perianal rash 10/19/2019  . LVH (left ventricular hypertrophy) 08/10/2019  . Central retinal artery occlusion of left eye 06/16/2019  . History 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 06/16/2019  . Obesity, Class I, BMI 30-34.9 06/16/2019  . Abnormal prostate by palpation 05/13/2019  . Primary osteoarthritis of left knee 05/06/2019  . Sciatica, right side 05/06/2019  . Medicare annual wellness visit, subsequent 02/23/2015  . Advanced care planning/counseling discussion 02/23/2015  . Health  maintenance examination 08/28/2013  . Paroxysmal SVT (supraventricular tachycardia) ([place]) 03/11/2013  . Dysplastic nevus of trunk 08/20/2012  . Aortic insufficiency   . Right ventricular dysfunction   . RBBB   . Status post mitral valve repair   . Chest discomfort   . Mitral regurgitation   . Migraines   . Exertional dyspnea 12/14/2008  . Dyslipidemia 12/13/2008  . Benign prostatic hyperplasia with lower urinary tract symptoms 05/10/2007   Past Medical History:  Diagnosis Date  . Allergy   . Aortic insufficiency    Mild, echo, January, 2014  . BCC (basal cell carcinoma), back 2017   s/p excisional biospy by derm  . BPH (benign prostatic hypertrophy)   . Chest discomfort    nuclear 06/24/09, suggestion prior small inferior MI with moderate peri-infarvt ischemia versus variable diaphragmatic attenuation  . Colon polyps    colonoscopy 07/02/06, repeat in 10 years  . Dyslipidemia   . Ejection fraction    EF 55%, echo, October, 2010  //   EF 45-50%,  catheterization, 2011  . Heart murmur   . Hyperlipidemia   . Migraines   . Mitral regurgitation    Mitral valve repair 2001  . Neurofibroma of foot 04/2017   s/p removal by derm  . Palpitations 07/2009    helped with carvedilol   . Pleurisy   . Postural dizziness    mild, no  orthostatic changes by blood pressure check  . Pre-syncope    January, 2014  . RBBB    First seen January, 2014  . Right ventricular dysfunction    Mild/moderate right ventricular dysfunction by echo, January, 2014, no tricuspid regurgitation, therefore right heart pressure could not be estimated.  . Shortness of breath   . Status post mitral valve repair    2001,  //  echo, October, 2010, good function of the repaired mitral valve  . Stroke ([place])   . SVT (supraventricular tachycardia) ([place]) 03/11/2013   Isolated episodes - last 03/2013 after flu treated with adenosine cardioversion in ER.   Marland Kitchen Thyroid disease    Past Surgical History:  Procedure  Laterality Date  . aneurysmal dilatation     proximal LAD cath 2001, reoeat catheter 2004, no significant change  . aneurysmal dilatiation  07/15/09   no change, normal right heart filling pressures and LVEDP- EF 45-50%  . APPENDECTOMY  1970  . BUBBLE STUDY  06/18/2019   Procedure: BUBBLE STUDY;  Surgeon: Alec Margarita, MD;  Location: [facility] ENDOSCOPY;  Service: Cardiovascular;;  . CARDIAC CATHETERIZATION  2010   mild global LV dysfunction EF 45-50%, no significant CAD obstruction, aneurysmal dilatation LAD  . COLONOSCOPY  2008  . COLONOSCOPY  06/2016   diverticulosis, rpt 10 yrs Alec Maldonado)  . CORONARY ANGIOPLASTY  09/1999   severe MR o/w normal limits  . CORONARY ANGIOPLASTY  12/19/02   pos. CAD, mild LV dysfunction- med treatment  . EYE SURGERY Left   . HERNIA REPAIR  01/11/05   left, Dr. Hassell Maldonado  . I & D EXTREMITY Right 02/04/2015   Procedure: IRRIGATION AND DEBRIDEMENT RIGHT INDEX FINGER OPEN FRACTURE AND REPAIR DIGITAL NERVE AND ARTERY;  Surgeon: Alec Cover, MD;  Location: [facility];  Service: Orthopedics;  Laterality: Right;  . LOOP RECORDER INSERTION N/A 06/18/2019   Procedure: LOOP RECORDER INSERTION;  Surgeon: Alec Sprang, MD;  Location: [facility] CV LAB;  Service: Cardiovascular;  Laterality: N/A;  . MITRAL VALVE REPAIR  01/22/00   good mitral valve function- echo 12/2008- EF 55% / nuclear 06/2009- EF 36%,? reliable? / EF 45-50%, cath rated 02/2010  . ORIF ELBOW FRACTURE     left  . POLYPECTOMY    . TEE WITHOUT CARDIOVERSION N/A 06/18/2019   Procedure: TRANSESOPHAGEAL ECHOCARDIOGRAM (TEE);  Surgeon: Alec Margarita, MD;  Location: [facility] ENDOSCOPY;  Service: Cardiovascular;  Laterality: N/A;  . THYROID SURGERY    . THYROIDECTOMY     left, due to goiter 1994     MEDICATIONS PRIOR TO ADMISSION: No current facility-administered medications for this encounter.  Current Outpatient Medications:  .  aspirin 81 MG tablet, Take 81 mg by mouth daily., Disp: , Rfl:  .   butalbital-acetaminophen-caffeine (FIORICET) 50-325-40 MG tablet, Take 1 tablet by mouth every 6 (six) hours as needed for headache or migraine., Disp: 30 tablet, Rfl: 1 .  carvedilol (COREG) 6.25 MG tablet, Take 1 tablet (6.25 mg total) by mouth 2 (two) times daily with a meal., Disp: 180 tablet, Rfl: 3 .  ezetimibe (ZETIA) 10 MG tablet, TAKE 1 TABLET(10 MG) BY MOUTH DAILY (Patient taking differently: Take 10 mg by mouth daily.), Disp: 90 tablet, Rfl: 3 .  finasteride (PROSCAR) 5 MG tablet, Take 1 tablet (5 mg total) by mouth daily., Disp: 90 tablet, Rfl: 3 .  ibuprofen (ADVIL) 200 MG tablet, Take 400 mg by mouth every 6 (six) hours as needed for moderate pain., Disp: , Rfl:  .  Omega-3 Fatty Acids (FISH OIL) 1200 MG CAPS, Take 1,200 mg by mouth daily., Disp: , Rfl:  .  simvastatin (ZOCOR) 80 MG tablet, TAKE 1 TABLET(80 MG) BY MOUTH DAILY, Disp: 90 tablet, Rfl: 3 .  tamsulosin (FLOMAX) 0.4 MG CAPS capsule, Take 2 capsules (0.8 mg total) by mouth at bedtime., Disp: 180 capsule, Rfl: 3   ALLERGIES:  No Known Allergies  REVIEW OF SYSTEMS:  ROS  FAMILY HISTORY:   Family History  Problem Relation Age of Onset  . Alzheimer's disease Mother   . Heart disease Mother        CHF  . Arthritis Father   . Heart disease Father        leaking mitral valve  . Benign prostatic hyperplasia Father   . Heart disease Brother        murmur  . Hypertension Neg Hx   . Diabetes Neg Hx   . Depression Neg Hx   . Alcohol abuse Neg Hx   . Drug abuse Neg Hx   . Stroke Neg Hx   . Cancer Neg Hx   . Colon cancer Neg Hx   . Esophageal cancer Neg Hx   . Rectal cancer Neg Hx   . Stomach cancer Neg Hx     SOCIAL HISTORY:   Social History   Occupational History  . Occupation: Artist  Tobacco Use  . Smoking status: Never Smoker  . Smokeless tobacco: Never Used  Vaping Use  . Vaping Use: Never used  Substance and Sexual Activity  . Alcohol use: No  . Drug use: No  . Sexual activity: Yes      EXAMINATION:  Vital signs in last 24 hours: There were no vitals taken for this visit.  Physical Exam Ortho Exam  Imaging Review Plain radiographs demonstrate moderate degenerative joint disease of the right knee. The overall alignment is significant varus. The bone quality appears to be good for age and reported activity level.  ASSESSMENT: End stage arthritis, right knee  Past Medical History:  Diagnosis Date  . Allergy   . Aortic insufficiency    Mild, echo, January, 2014  . BCC (basal cell carcinoma), back 2017   s/p excisional biospy by derm  . BPH (benign prostatic hypertrophy)   . Chest discomfort    nuclear 06/24/09, suggestion prior small inferior MI with moderate peri-infarvt ischemia versus variable diaphragmatic attenuation  . Colon polyps    colonoscopy 07/02/06, repeat in 10 years  . Dyslipidemia   . Ejection fraction    EF 55%, echo, October, 2010  //   EF 45-50%,  catheterization, 2011  . Heart murmur   . Hyperlipidemia   . Migraines   . Mitral regurgitation    Mitral valve repair 2001  . Neurofibroma of foot 04/2017   s/p removal by derm  . Palpitations 07/2009    helped with carvedilol   . Pleurisy   . Postural dizziness    mild, no orthostatic changes by blood pressure check  . Pre-syncope    January, 2014  . RBBB    First seen January, 2014  . Right ventricular dysfunction    Mild/moderate right ventricular dysfunction by echo, January, 2014, no tricuspid regurgitation, therefore right heart pressure could not be estimated.  . Shortness of breath   . Status post mitral valve repair    2001,  //  echo, October, 2010, good function of the repaired mitral valve  . Stroke ([place])   . SVT (supraventricular tachycardia) (  [place]) 03/11/2013   Isolated episodes - last 03/2013 after flu treated with adenosine cardioversion in ER.   Marland Kitchen Thyroid disease     PLAN: Plan for right total knee replacement.  The patient history, physical examination and imaging  studies are consistent with moderate degenerative joint disease of the right knee. The patient has failed conservative treatment.  The clearance notes were reviewed.  After discussion with the patient it was felt that Total Knee Replacement was indicated. The procedure,  risks, and benefits of total knee arthroplasty were presented and reviewed. The risks including but not limited to aseptic loosening, infection, blood clots, vascular and nerve injury, stiffness, patella tracking problems and fracture complications among others were discussed. The patient acknowledged the explanation, agreed to proceed with total knee replacement.   Patient's anticipated LOS is less than 2 midnights, meeting these requirements: - Younger than 79 - Lives within 1 hour of care - Has a competent adult at home to recover with post-op recover - NO history of  - Chronic pain requiring opiods  - Diabetes  - Coronary Artery Disease  - Heart failure  - Heart attack  - Stroke  - DVT/VTE  - Cardiac arrhythmia  - Respiratory Failure/COPD  - Renal failure  - Anemia  - Advanced Liver disease      Mike Craze. Micheal Likens [phone]  06/17/2020 11:17 AM

## 2020-06-17 NOTE — H&P (Signed)
Alec Fears, MD   Biagio Borg, PA-C [address] [place]  [address]                             [phone]   Alec Maldonado MRN:  [id number] DOB/SEX:  May 28, 1950/male  CHIEF COMPLAINT:  Painful right Knee  HISTORY: Patient is a 70 y.o. male presented with a history of pain in the right knee for 1 years. Onset of symptoms was gradual starting 2 years ago with rapidly worsening course since that time. Prior procedures on the knee are none. Patient has been treated conservatively with over-the-counter NSAIDs and activity modification. Patient currently rates pain in the knee at 9 out of 10 with activity. There is pain at night. present.  They have been previously treated with: NSAIDS: Ibuprofen with no improvement  Knee injection with corticosteroid  was performed Knee injection with visco supplementation was performed Medications: Ibuprofen with mild improvement  PAST MEDICAL HISTORY: Patient Active Problem List   Diagnosis Date Noted  . Pre-op evaluation 05/18/2020  . Bilateral primary osteoarthritis of knee 02/11/2020  . Posterior vitreous detachment, both eyes 01/13/2020  . Acute right-sided low back pain 10/19/2019  . Bowel habit changes 10/19/2019  . Perianal rash 10/19/2019  . LVH (left ventricular hypertrophy) 08/10/2019  . Central retinal artery occlusion of left eye 06/16/2019  . History 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 06/16/2019  . Obesity, Class I, BMI 30-34.9 06/16/2019  . Abnormal prostate by palpation 05/13/2019  . Primary osteoarthritis of left knee 05/06/2019  . Sciatica, right side 05/06/2019  . Medicare annual wellness visit, subsequent 02/23/2015  . Advanced care planning/counseling discussion 02/23/2015  . Health maintenance examination 08/28/2013  . Paroxysmal SVT (supraventricular tachycardia) ([place]) 03/11/2013  . Dysplastic nevus of trunk 08/20/2012   . Aortic insufficiency   . Right ventricular dysfunction   . RBBB   . Status post mitral valve repair   . Chest discomfort   . Mitral regurgitation   . Migraines   . Exertional dyspnea 12/14/2008  . Dyslipidemia 12/13/2008  . Benign prostatic hyperplasia with lower urinary tract symptoms 05/10/2007   Past Medical History:  Diagnosis Date  . Allergy   . Aortic insufficiency    Mild, echo, January, 2014  . BCC (basal cell carcinoma), back 2017   s/p excisional biospy by derm  . BPH (benign prostatic hypertrophy)   . Chest discomfort    nuclear 06/24/09, suggestion prior small inferior MI with moderate peri-infarvt ischemia versus variable diaphragmatic attenuation  . Colon polyps    colonoscopy 07/02/06, repeat in 10 years  . Dyslipidemia   . Ejection fraction    EF 55%, echo, October, 2010  //   EF 45-50%,  catheterization, 2011  . Heart murmur   . Hyperlipidemia   . Migraines   . Mitral regurgitation    Mitral valve repair 2001  . Neurofibroma of foot 04/2017   s/p removal by derm  . Palpitations 07/2009    helped with carvedilol   . Pleurisy   . Postural dizziness    mild, no orthostatic changes by blood pressure check  . Pre-syncope    January, 2014  . RBBB    First seen January, 2014  . Right ventricular dysfunction    Mild/moderate right ventricular dysfunction by echo, January, 2014, no tricuspid regurgitation, therefore right heart pressure could not be  estimated.  . Shortness of breath   . Status post mitral valve repair    2001,  //  echo, October, 2010, good function of the repaired mitral valve  . Stroke ([place])   . SVT (supraventricular tachycardia) ([place]) 03/11/2013   Isolated episodes - last 03/2013 after flu treated with adenosine cardioversion in ER.   Marland Kitchen Thyroid disease    Past Surgical History:  Procedure Laterality Date  . aneurysmal dilatation     proximal LAD cath 2001, reoeat catheter 2004, no significant change  . aneurysmal dilatiation  07/15/09    no change, normal right heart filling pressures and LVEDP- EF 45-50%  . APPENDECTOMY  1970  . BUBBLE STUDY  06/18/2019   Procedure: BUBBLE STUDY;  Surgeon: Sueanne Margarita, MD;  Location: [facility] ENDOSCOPY;  Service: Cardiovascular;;  . CARDIAC CATHETERIZATION  2010   mild global LV dysfunction EF 45-50%, no significant CAD obstruction, aneurysmal dilatation LAD  . COLONOSCOPY  2008  . COLONOSCOPY  06/2016   diverticulosis, rpt 10 yrs Ardis Hughs)  . CORONARY ANGIOPLASTY  09/1999   severe MR o/w normal limits  . CORONARY ANGIOPLASTY  12/19/02   pos. CAD, mild LV dysfunction- med treatment  . EYE SURGERY Left   . HERNIA REPAIR  01/11/05   left, Dr. Hassell Done  . I & D EXTREMITY Right 02/04/2015   Procedure: IRRIGATION AND DEBRIDEMENT RIGHT INDEX FINGER OPEN FRACTURE AND REPAIR DIGITAL NERVE AND ARTERY;  Surgeon: Leanora Cover, MD;  Location: [facility];  Service: Orthopedics;  Laterality: Right;  . LOOP RECORDER INSERTION N/A 06/18/2019   Procedure: LOOP RECORDER INSERTION;  Surgeon: Deboraha Sprang, MD;  Location: [facility] CV LAB;  Service: Cardiovascular;  Laterality: N/A;  . MITRAL VALVE REPAIR  01/22/00   good mitral valve function- echo 12/2008- EF 55% / nuclear 06/2009- EF 36%,? reliable? / EF 45-50%, cath rated 02/2010  . ORIF ELBOW FRACTURE     left  . POLYPECTOMY    . TEE WITHOUT CARDIOVERSION N/A 06/18/2019   Procedure: TRANSESOPHAGEAL ECHOCARDIOGRAM (TEE);  Surgeon: Sueanne Margarita, MD;  Location: [facility] ENDOSCOPY;  Service: Cardiovascular;  Laterality: N/A;  . THYROID SURGERY    . THYROIDECTOMY     left, due to goiter 1994     MEDICATIONS PRIOR TO ADMISSION: No current facility-administered medications for this encounter.  Current Outpatient Medications:  .  aspirin 81 MG tablet, Take 81 mg by mouth daily., Disp: , Rfl:  .  butalbital-acetaminophen-caffeine (FIORICET) 50-325-40 MG tablet, Take 1 tablet by mouth every 6 (six) hours as needed for headache or migraine., Disp: 30 tablet, Rfl: 1 .   carvedilol (COREG) 6.25 MG tablet, Take 1 tablet (6.25 mg total) by mouth 2 (two) times daily with a meal., Disp: 180 tablet, Rfl: 3 .  ezetimibe (ZETIA) 10 MG tablet, TAKE 1 TABLET(10 MG) BY MOUTH DAILY (Patient taking differently: Take 10 mg by mouth daily.), Disp: 90 tablet, Rfl: 3 .  finasteride (PROSCAR) 5 MG tablet, Take 1 tablet (5 mg total) by mouth daily., Disp: 90 tablet, Rfl: 3 .  ibuprofen (ADVIL) 200 MG tablet, Take 400 mg by mouth every 6 (six) hours as needed for moderate pain., Disp: , Rfl:  .  Omega-3 Fatty Acids (FISH OIL) 1200 MG CAPS, Take 1,200 mg by mouth daily., Disp: , Rfl:  .  simvastatin (ZOCOR) 80 MG tablet, TAKE 1 TABLET(80 MG) BY MOUTH DAILY, Disp: 90 tablet, Rfl: 3 .  tamsulosin (FLOMAX) 0.4 MG CAPS capsule, Take 2 capsules (  0.8 mg total) by mouth at bedtime., Disp: 180 capsule, Rfl: 3   ALLERGIES:  No Known Allergies  REVIEW OF SYSTEMS:  ROS  FAMILY HISTORY:   Family History  Problem Relation Age of Onset  . Alzheimer's disease Mother   . Heart disease Mother        CHF  . Arthritis Father   . Heart disease Father        leaking mitral valve  . Benign prostatic hyperplasia Father   . Heart disease Brother        murmur  . Hypertension Neg Hx   . Diabetes Neg Hx   . Depression Neg Hx   . Alcohol abuse Neg Hx   . Drug abuse Neg Hx   . Stroke Neg Hx   . Cancer Neg Hx   . Colon cancer Neg Hx   . Esophageal cancer Neg Hx   . Rectal cancer Neg Hx   . Stomach cancer Neg Hx     SOCIAL HISTORY:   Social History   Occupational History  . Occupation: Artist  Tobacco Use  . Smoking status: Never Smoker  . Smokeless tobacco: Never Used  Vaping Use  . Vaping Use: Never used  Substance and Sexual Activity  . Alcohol use: No  . Drug use: No  . Sexual activity: Yes     EXAMINATION:  Vital signs in last 24 hours: There were no vitals taken for this visit.  Physical Exam Ortho Exam  Imaging Review Plain radiographs demonstrate  moderate degenerative joint disease of the right knee. The overall alignment is significant varus. The bone quality appears to be good for age and reported activity level.  ASSESSMENT: End stage arthritis, right knee  Past Medical History:  Diagnosis Date  . Allergy   . Aortic insufficiency    Mild, echo, January, 2014  . BCC (basal cell carcinoma), back 2017   s/p excisional biospy by derm  . BPH (benign prostatic hypertrophy)   . Chest discomfort    nuclear 06/24/09, suggestion prior small inferior MI with moderate peri-infarvt ischemia versus variable diaphragmatic attenuation  . Colon polyps    colonoscopy 07/02/06, repeat in 10 years  . Dyslipidemia   . Ejection fraction    EF 55%, echo, October, 2010  //   EF 45-50%,  catheterization, 2011  . Heart murmur   . Hyperlipidemia   . Migraines   . Mitral regurgitation    Mitral valve repair 2001  . Neurofibroma of foot 04/2017   s/p removal by derm  . Palpitations 07/2009    helped with carvedilol   . Pleurisy   . Postural dizziness    mild, no orthostatic changes by blood pressure check  . Pre-syncope    January, 2014  . RBBB    First seen January, 2014  . Right ventricular dysfunction    Mild/moderate right ventricular dysfunction by echo, January, 2014, no tricuspid regurgitation, therefore right heart pressure could not be estimated.  . Shortness of breath   . Status post mitral valve repair    2001,  //  echo, October, 2010, good function of the repaired mitral valve  . Stroke ([place])   . SVT (supraventricular tachycardia) ([place]) 03/11/2013   Isolated episodes - last 03/2013 after flu treated with adenosine cardioversion in ER.   Marland Kitchen Thyroid disease     PLAN: Plan for right total knee replacement.  The patient history, physical examination and imaging studies are consistent with moderate degenerative joint  disease of the right knee. The patient has failed conservative treatment.  The clearance notes were reviewed.  After  discussion with the patient it was felt that Total Knee Replacement was indicated. The procedure,  risks, and benefits of total knee arthroplasty were presented and reviewed. The risks including but not limited to aseptic loosening, infection, blood clots, vascular and nerve injury, stiffness, patella tracking problems and fracture complications among others were discussed. The patient acknowledged the explanation, agreed to proceed with total knee replacement.   Patient's anticipated LOS is less than 2 midnights, meeting these requirements: - Younger than 32 - Lives within 1 hour of care - Has a competent adult at home to recover with post-op recover - NO history of  - Chronic pain requiring opiods  - Diabetes  - Coronary Artery Disease  - Heart failure  - Heart attack  - Stroke  - DVT/VTE  - Cardiac arrhythmia  - Respiratory Failure/COPD  - Renal failure  - Anemia  - Advanced Liver disease      Mike Craze. Micheal Likens [phone]  06/17/2020 11:17 AM

## 2020-06-17 NOTE — Telephone Encounter (Signed)
Ortho bundle Pre-op call completed. 

## 2020-06-22 ENCOUNTER — Ambulatory Visit: Payer: Medicare Other | Admitting: Orthopaedic Surgery

## 2020-06-24 ENCOUNTER — Ambulatory Visit: Payer: Medicare Other | Admitting: Orthopaedic Surgery

## 2020-06-24 ENCOUNTER — Encounter: Payer: Self-pay | Admitting: Internal Medicine

## 2020-06-24 NOTE — Progress Notes (Signed)
PERIOPERATIVE PRESCRIPTION FOR IMPLANTED CARDIAC DEVICE PROGRAMMING  Patient Information: Name:  DEMAREE LIBERTO  DOB:  [birth year]  MRN:  [id number]    Johny Drilling, RN  P Cv Div Heartcare Device Planned Procedure: Right total knee arthroplasty  Surgeon: Dr. Joni Fears  Date of Procedure: 06/29/20  Cautery will be used.  Position during surgery:    Please send documentation back to:  [facility] (Fax # [phone])   Johny Drilling, RN  06/24/2020 3:35 PM     Device Information:  Clinic EP Physician:  Virl Axe, MD   Device Type:  Implanted Loop Recorder Manufacturer and Phone #:  [employer]: [id number] Pacemaker Dependent?:  N/A Date of Last Device Check:  05/25/20 Normal Device Function?:  N/A  Electrophysiologist's Recommendations:   Have magnet available.  Provide continuous ECG monitoring when magnet is used or reprogramming is to be performed.   Procedure should not interfere with device function.  No device programming or magnet placement needed.  Per Device Clinic Standing Orders, York Ram, RN  3:57 PM 06/24/2020

## 2020-06-24 NOTE — Patient Instructions (Signed)
DUE TO COVID-19 ONLY ONE VISITOR IS ALLOWED TO COME WITH YOU AND STAY IN THE WAITING ROOM ONLY DURING PRE OP AND PROCEDURE DAY OF SURGERY. THE 1 VISITOR  MAY VISIT WITH YOU AFTER SURGERY IN YOUR PRIVATE ROOM DURING VISITING HOURS ONLY!  YOU NEED TO HAVE A COVID 19 TEST ON_4/22______ @_______ , THIS TEST MUST BE DONE BEFORE SURGERY,  COVID TESTING SITE [address] [place] [address], IT IS ON THE RIGHT GOING OUT [address] APPROXIMATELY  2 MINUTES PAST [employer] ON THE RIGHT. ONCE YOUR COVID TEST IS COMPLETED,  PLEASE BEGIN THE QUARANTINE INSTRUCTIONS AS OUTLINED IN YOUR HANDOUT.                Alec Maldonado    Your procedure is scheduled on: 06/29/20   Report to [facility] Main  Entrance   Report to Short stay at 5:15  AM     Call this number if you have problems the morning of surgery [name], NO CHEWING GUM [place].   No food after midnight.    You may have clear liquid until 4:30 AM.    At 4:00 AM drink pre surgery drink.   Nothing by mouth after 4:30 AM.   Take these medicines the morning of surgery with A SIP OF WATER: Carvedilol, Finasteride                                 You may not have any metal on your body including              piercings  Do not wear jewelry, lotions, powders or deodorant              Men may shave face and neck.   Do not bring valuables to the hospital. [place].  Contacts, dentures or bridgework may not be worn into surgery.      Patients discharged the day of surgery will not be allowed to drive home.   IF YOU ARE HAVING SURGERY AND GOING HOME THE SAME DAY, YOU MUST HAVE AN ADULT TO DRIVE YOU HOME AND BE WITH YOU FOR 24 HOURS.   YOU MAY GO HOME BY TAXI OR UBER OR ORTHERWISE, BUT AN ADULT MUST ACCOMPANY YOU HOME AND STAY WITH YOU FOR 24 HOURS.  Name and phone number of your  driver:  Special Instructions: N/A              Please read over the following fact sheets you were given: _____________________________________________________________________             [facility] - Preparing for Surgery Before surgery, you can play an important role.  Because skin is not sterile, your skin needs to be as free of germs as possible.  You can reduce the number of germs on your skin by washing with CHG (chlorahexidine gluconate) soap before surgery.  CHG is an antiseptic cleaner which kills germs and bonds with the skin to continue killing germs even after washing. Please DO NOT use if you have an allergy to CHG or antibacterial soaps.  If your skin becomes reddened/irritated stop using the CHG and inform your nurse when you arrive at Short Stay. Marland Kitchen  You may shave your face/neck.  Please follow these instructions carefully:  1.  Shower with CHG Soap the night before surgery and the  morning of Surgery.  2.  If you choose to wash your hair, wash your hair first as usual with your  normal  shampoo.  3.  After you shampoo, rinse your hair and body thoroughly to remove the  shampoo.                                        4.  Use CHG as you would any other liquid soap.  You can apply chg directly  to the skin and wash                       Gently with a scrungie or clean washcloth.  5.  Apply the CHG Soap to your body ONLY FROM THE NECK DOWN.   Do not use on face/ open                           Wound or open sores. Avoid contact with eyes, ears mouth and genitals (private parts).                       Wash face,  Genitals (private parts) with your normal soap.             6.  Wash thoroughly, paying special attention to the area where your surgery  will be performed.  7.  Thoroughly rinse your body with warm water from the neck down.  8.  DO NOT shower/wash with your normal soap after using and rinsing off  the CHG Soap.             9.  Pat yourself dry with a clean towel.             10.  Wear clean pajamas.            11.  Place clean sheets on your bed the night of your first shower and do not  sleep with pets. Day of Surgery : Do not apply any lotions/deodorants the morning of surgery.  Please wear clean clothes to the hospital/surgery center.  FAILURE TO FOLLOW THESE INSTRUCTIONS MAY RESULT IN THE CANCELLATION OF YOUR SURGERY PATIENT SIGNATURE_________________________________  NURSE SIGNATURE__________________________________  ________________________________________________________________________

## 2020-06-25 ENCOUNTER — Encounter (HOSPITAL_COMMUNITY): Payer: Self-pay

## 2020-06-25 ENCOUNTER — Other Ambulatory Visit (HOSPITAL_COMMUNITY)
Admission: RE | Admit: 2020-06-25 | Discharge: 2020-06-25 | Disposition: A | Discharge status: Home / Self Care | Payer: Medicare Other | Source: Ambulatory Visit | Attending: Orthopaedic Surgery | Admitting: Orthopaedic Surgery

## 2020-06-25 ENCOUNTER — Other Ambulatory Visit (HOSPITAL_COMMUNITY): Payer: Medicare Other

## 2020-06-25 ENCOUNTER — Other Ambulatory Visit: Payer: Self-pay

## 2020-06-25 ENCOUNTER — Ambulatory Visit (HOSPITAL_COMMUNITY)
Admission: RE | Admit: 2020-06-25 | Discharge: 2020-06-25 | Disposition: A | Discharge status: Home / Self Care | Payer: Medicare Other | Source: Ambulatory Visit | Attending: Orthopedic Surgery | Admitting: Orthopedic Surgery

## 2020-06-25 ENCOUNTER — Encounter (HOSPITAL_COMMUNITY)
Admission: RE | Admit: 2020-06-25 | Discharge: 2020-06-25 | Disposition: A | Discharge status: Home / Self Care | Payer: Medicare Other | Source: Ambulatory Visit | Attending: Orthopaedic Surgery | Admitting: Orthopaedic Surgery

## 2020-06-25 DIAGNOSIS — Z7982 Long term (current) use of aspirin: Secondary | ICD-10-CM | POA: Diagnosis not present

## 2020-06-25 DIAGNOSIS — Z8673 Personal history of transient ischemic attack (TIA), and cerebral infarction without residual deficits: Secondary | ICD-10-CM | POA: Diagnosis not present

## 2020-06-25 DIAGNOSIS — I2510 Atherosclerotic heart disease of native coronary artery without angina pectoris: Secondary | ICD-10-CM | POA: Diagnosis not present

## 2020-06-25 DIAGNOSIS — Z95818 Presence of other cardiac implants and grafts: Secondary | ICD-10-CM | POA: Insufficient documentation

## 2020-06-25 DIAGNOSIS — Z01818 Encounter for other preprocedural examination: Secondary | ICD-10-CM | POA: Diagnosis not present

## 2020-06-25 DIAGNOSIS — M1711 Unilateral primary osteoarthritis, right knee: Secondary | ICD-10-CM | POA: Diagnosis not present

## 2020-06-25 DIAGNOSIS — Z79899 Other long term (current) drug therapy: Secondary | ICD-10-CM | POA: Insufficient documentation

## 2020-06-25 DIAGNOSIS — Z791 Long term (current) use of non-steroidal anti-inflammatories (NSAID): Secondary | ICD-10-CM | POA: Diagnosis not present

## 2020-06-25 DIAGNOSIS — Z20822 Contact with and (suspected) exposure to covid-19: Secondary | ICD-10-CM | POA: Insufficient documentation

## 2020-06-25 DIAGNOSIS — I517 Cardiomegaly: Secondary | ICD-10-CM | POA: Insufficient documentation

## 2020-06-25 DIAGNOSIS — I471 Supraventricular tachycardia: Secondary | ICD-10-CM | POA: Diagnosis not present

## 2020-06-25 HISTORY — DX: Cardiac arrhythmia, unspecified: I49.9

## 2020-06-25 HISTORY — DX: Atherosclerotic heart disease of native coronary artery without angina pectoris: I25.10

## 2020-06-25 HISTORY — DX: Prediabetes: R73.03

## 2020-06-25 LAB — URINALYSIS, ROUTINE W REFLEX MICROSCOPIC
Bilirubin Urine: NEGATIVE
Glucose, UA: NEGATIVE mg/dL
Hgb urine dipstick: NEGATIVE
Ketones, ur: NEGATIVE mg/dL
Leukocytes,Ua: NEGATIVE
Nitrite: NEGATIVE
Protein, ur: NEGATIVE mg/dL
Specific Gravity, Urine: 1.004 — ABNORMAL LOW (ref 1.005–1.030)
pH: 6 (ref 5.0–8.0)

## 2020-06-25 LAB — CBC WITH DIFFERENTIAL/PLATELET
Abs Immature Granulocytes: 0.06 10*3/uL (ref 0.00–0.07)
Basophils Absolute: 0.1 10*3/uL (ref 0.0–0.1)
Basophils Relative: 1 %
Eosinophils Absolute: 0.2 10*3/uL (ref 0.0–0.5)
Eosinophils Relative: 2 %
HCT: 39.1 % (ref 39.0–52.0)
Hemoglobin: 12.3 g/dL — ABNORMAL LOW (ref 13.0–17.0)
Immature Granulocytes: 1 %
Lymphocytes Relative: 23 %
Lymphs Abs: 1.8 10*3/uL (ref 0.7–4.0)
MCH: 27.1 pg (ref 26.0–34.0)
MCHC: 31.5 g/dL (ref 30.0–36.0)
MCV: 86.1 fL (ref 80.0–100.0)
Monocytes Absolute: 0.9 10*3/uL (ref 0.1–1.0)
Monocytes Relative: 12 %
Neutro Abs: 4.8 10*3/uL (ref 1.7–7.7)
Neutrophils Relative %: 61 %
Platelets: 258 10*3/uL (ref 150–400)
RBC: 4.54 MIL/uL (ref 4.22–5.81)
RDW: 14 % (ref 11.5–15.5)
WBC: 7.9 10*3/uL (ref 4.0–10.5)
nRBC: 0 % (ref 0.0–0.2)

## 2020-06-25 LAB — SARS CORONAVIRUS 2 (TAT 6-24 HRS): SARS Coronavirus 2: NEGATIVE

## 2020-06-25 LAB — SURGICAL PCR SCREEN
MRSA, PCR: NEGATIVE
Staphylococcus aureus: NEGATIVE

## 2020-06-25 LAB — COMPREHENSIVE METABOLIC PANEL
ALT: 22 U/L (ref 0–44)
AST: 19 U/L (ref 15–41)
Albumin: 4.2 g/dL (ref 3.5–5.0)
Alkaline Phosphatase: 63 U/L (ref 38–126)
Anion gap: 9 (ref 5–15)
BUN: 18 mg/dL (ref 8–23)
CO2: 23 mmol/L (ref 22–32)
Calcium: 9.1 mg/dL (ref 8.9–10.3)
Chloride: 108 mmol/L (ref 98–111)
Creatinine, Ser: 1.07 mg/dL (ref 0.61–1.24)
GFR, Estimated: >60 mL/min (ref >60)
Glucose, Bld: 115 mg/dL — ABNORMAL HIGH (ref 70–99)
Potassium: 3.9 mmol/L (ref 3.5–5.1)
Sodium: 140 mmol/L (ref 135–145)
Total Bilirubin: 0.6 mg/dL (ref 0.3–1.2)
Total Protein: 7 g/dL (ref 6.5–8.1)

## 2020-06-25 LAB — PROTIME-INR
INR: 1 (ref 0.8–1.2)
Prothrombin Time: 12.9 seconds (ref 11.4–15.2)

## 2020-06-25 NOTE — Progress Notes (Signed)
COVID Vaccine Completed:Yes Date COVID Vaccine completed:01/2020-booster 04/2020 COVID vaccine manufacturer: Pfizer      PCP - Dr. Biagio Borg Cardiologist - Dr. Jacinto Reap. christopher  Chest x-ray - no EKG - 06/16/20-epic Stress Test - no ECHO - 06/18/19-epic Cardiac Cath - '01,04, 2010 Pacemaker/ICD device last checked:Loop recorder checked 05/30/20  Sleep Study - no CPAP -   Fasting Blood Sugar - NA Checks Blood Sugar _____ times a day  Blood Thinner Instructions:ASA 81/ Dr. Danise Mina Aspirin Instructions:stop 1 day prior to DOS/ [place] Last Dose:06/28/20  Anesthesia review:   Patient denies shortness of breath, fever, cough and chest pain at PAT appointment yes  Patient verbalized understanding of instructions that were given to them at the PAT appointment. Patient was also instructed that they will need to review over the PAT instructions again at home before surgery.yes Pt can clime only 1/2 flight of stairs without SOB. He can get SOB with some activities but seldom while getting dressed in the morning.  He had a stoke in 2021 leaving his left eye blind. The loop recorder was placed after the stroke.Marland Kitchen

## 2020-06-27 LAB — URINE CULTURE: Culture: NO GROWTH

## 2020-06-28 ENCOUNTER — Ambulatory Visit (INDEPENDENT_AMBULATORY_CARE_PROVIDER_SITE_OTHER): Payer: Medicare Other

## 2020-06-28 DIAGNOSIS — I63449 Cerebral infarction due to embolism of unspecified cerebellar artery: Secondary | ICD-10-CM | POA: Diagnosis not present

## 2020-06-28 NOTE — Anesthesia Preprocedure Evaluation (Addendum)
Anesthesia Evaluation  Patient identified by MRN, date of birth, ID band Patient awake    Reviewed: Allergy & Precautions, NPO status , Patient's Chart, lab work & pertinent test results  Airway Mallampati: II  TM Distance: >3 FB Neck ROM: Full    Dental  (+) Dental Advisory Given   Pulmonary shortness of breath,    Pulmonary exam normal breath sounds clear to auscultation       Cardiovascular hypertension, Pt. on home beta blockers + CAD  Normal cardiovascular exam+ dysrhythmias + Valvular Problems/Murmurs MR and AI  Rhythm:Regular Rate:Normal  Ech 06/2019 1. Left ventricular ejection fraction, by estimation, is 50 to 55%. The left ventricle has low normal function. The left ventricle has no regional wall motion abnormalities.  2. Right ventricular systolic function is normal. The right ventricular size is normal.  3. Left atrial size was severely dilated. No left atrial/left atrial appendage thrombus was detected.  4. S/P MV repair. The MV is degenerative with thickened and calcified leaflets. There is some restricted leaflet motion but no mitral stenosis with mean MVG of 40mHg. There is mild mitral regurgitation.  5. The aortic valve is tricuspid. Aortic valve regurgitation is mild. Mild aortic valve sclerosis is present, with no evidence of aortic valve stenosis.  6. The inferior vena cava is normal in size with greater than 50% respiratory variability, suggesting right atrial pressure of 361mg.  7. The interatrial septum appears to be lipomatous. No atrial level shunt detected by color flow Doppler. Agitated saline contrast was given intravenously to evaluate for intracardiac shunting. There is no evidence of a patent foramen ovale. There is no evidence of an atrial septal defect.    Neuro/Psych  Headaches,  Neuromuscular disease CVA    GI/Hepatic negative GI ROS, Neg liver ROS,   Endo/Other  negative endocrine ROS   Renal/GU negative Renal ROS     Musculoskeletal  (+) Arthritis ,   Abdominal (+) + obese,   Peds  Hematology negative hematology ROS (+)   Anesthesia Other Findings   Reproductive/Obstetrics                                                             Anesthesia Evaluation  Patient identified by MRN, date of birth, ID band Patient awake    Reviewed: Allergy & Precautions, NPO status , Patient's Chart, lab work & pertinent test results  Airway Mallampati: I  TM Distance: >3 FB Neck ROM: Full    Dental  (+) Teeth Intact, Dental Advisory Given   Pulmonary shortness of breath,    breath sounds clear to auscultation       Cardiovascular + dysrhythmias + Valvular Problems/Murmurs MR  Rhythm:Regular Rate:Normal  Echo 06/16/2019 1. Left ventricular ejection fraction, by estimation, is 50 to 55%. The left ventricle has low normal function. The left ventricle has no regional wall motion abnormalities. The left ventricular internal cavity size was mildly dilated. There is severe asymmetric left ventricular hypertrophy. Left ventricular diastolic parameters are consistent with Grade I diastolic dysfunction (impaired relaxation).  2. Right ventricular systolic function is normal. The right ventricular size is normal. There is normal pulmonary artery systolic pressure.  3. Left atrial size was severely dilated.  4. The mitral valve is degenerative. Trivial mitral valve regurgitation. No evidence of mitral  stenosis.  5. The aortic valve is tricuspid. Aortic valve regurgitation is trivial. No aortic stenosis is present.  6. Aortic dilatation noted. There is mild dilatation of the ascending aorta.  7. The inferior vena cava is normal in size with greater than 50% respiratory variability, suggesting right atrial pressure of 3 mmHg.   Neuro/Psych  Headaches, CVA negative psych ROS   GI/Hepatic negative GI ROS, Neg liver ROS,   Endo/Other   negative endocrine ROS  Renal/GU negative Renal ROS     Musculoskeletal  (+) Arthritis ,   Abdominal (+) + obese,   Peds  Hematology negative hematology ROS (+)   Anesthesia Other Findings   Reproductive/Obstetrics                             Anesthesia Physical  Anesthesia Plan  ASA: III  Anesthesia Plan: MAC   Post-op Pain Management:    Induction: Intravenous  PONV Risk Score and Plan: 1 and TIVA, Propofol infusion and Treatment may vary due to age or medical condition  Airway Management Planned: Natural Airway  Additional Equipment:   Intra-op Plan:   Post-operative Plan:   Informed Consent: I have reviewed the patients History and Physical, chart, labs and discussed the procedure including the risks, benefits and alternatives for the proposed anesthesia with the patient or authorized representative who has indicated his/her understanding and acceptance.     Dental advisory given  Plan Discussed with: CRNA  Anesthesia Plan Comments:         Anesthesia Quick Evaluation  Anesthesia Physical Anesthesia Plan  ASA: III  Anesthesia Plan: Spinal   Post-op Pain Management:  Regional for Post-op pain   Induction: Intravenous  PONV Risk Score and Plan: 2 and Propofol infusion, TIVA, Treatment may vary due to age or medical condition, Dexamethasone and Ondansetron  Airway Management Planned: Natural Airway  Additional Equipment: None  Intra-op Plan:   Post-operative Plan:   Informed Consent: I have reviewed the patients History and Physical, chart, labs and discussed the procedure including the risks, benefits and alternatives for the proposed anesthesia with the patient or authorized representative who has indicated his/her understanding and acceptance.     Dental advisory given  Plan Discussed with: CRNA  Anesthesia Plan Comments: (See PAT note 06/25/20, Konrad Felix, PA-C)      Anesthesia Quick  Evaluation

## 2020-06-28 NOTE — Progress Notes (Signed)
Anesthesia Chart Review   Case: [id number] Date/Time: 06/29/20 0700   Procedure: RIGHT TOTAL KNEE ARTHROPLASTY (Right Knee)   Anesthesia type: Choice   Pre-op diagnosis: RIGHT KNEE OSTEOARTHRITIS   Location: [facility] ROOM 04 / [facility]   Surgeons: Garald Balding, MD      DISCUSSION:70 y.o. never smoker with h/o CAD, stroke, paroxysmal SVT, loop recorder in place, right knee OA scheduled for above procedure 06/29/2020 with Dr. Joni Fears.   Pt see by cardiology 06/16/2020 for preoperative evaluation.  Per OV note, "Chart reviewed as part of pre-operative protocol coverage. Given past medical history and time since last visit, based on ACC/AHA guidelines, Alec Maldonado would be at acceptable risk for the planned procedure without further cardiovascular testing.  His RCRI is a class II risk, 0.9% risk of major cardiac event.  He is able to complete greater than 4 METs of physical activity."  Anticipate pt can proceed with planned procedure barring acute status change.   VS: BP 138/73   Pulse 73   Temp 36.8 C (Oral)   Resp (!) 22   Ht 5\' 10"  (1.778 m)   Wt 109.8 kg   SpO2 96%   BMI 34.72 kg/m   PROVIDERS: Ria Bush, MD is PCP   Buford Dresser, MD is Cardiologist  LABS: Labs reviewed: Acceptable for surgery. (all labs ordered are listed, but only abnormal results are displayed)  Labs Reviewed  CBC WITH DIFFERENTIAL/PLATELET - Abnormal; Notable for the following components:      Result Value   Hemoglobin 12.3 (*)    All other components within normal limits  COMPREHENSIVE METABOLIC PANEL - Abnormal; Notable for the following components:   Glucose, Bld 115 (*)    All other components within normal limits  URINALYSIS, ROUTINE W REFLEX MICROSCOPIC - Abnormal; Notable for the following components:   Color, Urine COLORLESS (*)    Specific Gravity, Urine 1.004 (*)    All other components within normal limits  URINE CULTURE  SURGICAL PCR SCREEN  PROTIME-INR      IMAGES:   EKG: 06/16/2020 Rate 69 bpm  NSR  RBBB  CV: Echo 06/18/2019 1. Left ventricular ejection fraction, by estimation, is 50 to 55%. The  left ventricle has low normal function. The left ventricle has no regional  wall motion abnormalities.  2. Right ventricular systolic function is normal. The right ventricular  size is normal.  3. Left atrial size was severely dilated. No left atrial/left atrial  appendage thrombus was detected.  4. S/P MV repair. The MV is degenerative with thickened and calcified  leaflets. There is some restricted leaflet motion but no mitral stenosis  with mean MVG of 55mmHg. There is mild mitral regurgitation.  5. The aortic valve is tricuspid. Aortic valve regurgitation is mild.  Mild aortic valve sclerosis is present, with no evidence of aortic valve  stenosis.  6. The inferior vena cava is normal in size with greater than 50%  respiratory variability, suggesting right atrial pressure of 3 mmHg.  7. The interatrial septum appears to be lipomatous. No atrial level shunt  detected by color flow Doppler. Agitated saline contrast was given  intravenously to evaluate for intracardiac shunting. There is no evidence  of a patent foramen ovale. There is no  evidence of an atrial septal defect.  Past Medical History:  Diagnosis Date  . Allergy   . Aortic insufficiency    Mild, echo, January, 2014  . BCC (basal cell carcinoma), back 2017  s/p excisional biospy by derm  . BPH (benign prostatic hypertrophy)   . Chest discomfort    nuclear 06/24/09, suggestion prior small inferior MI with moderate peri-infarvt ischemia versus variable diaphragmatic attenuation  . Colon polyps    colonoscopy 07/02/06, repeat in 10 years  . Coronary artery disease   . Dyslipidemia   . Dysrhythmia    supraventricular tachycardia, paplitations  . Ejection fraction    EF 55%, echo, October, 2010  //   EF 45-50%,  catheterization, 2011  . Heart murmur   .  Hyperlipidemia   . Migraines    rare  . Mitral regurgitation    Mitral valve repair 2001  . Neurofibroma of foot 04/2017   s/p removal by derm  . Palpitations 07/2009    helped with carvedilol   . Pleurisy   . Postural dizziness    mild, no orthostatic changes by blood pressure check  . Pre-diabetes   . Pre-syncope    January, 2014  . Right ventricular dysfunction    Mild/moderate right ventricular dysfunction by echo, January, 2014, no tricuspid regurgitation, therefore right heart pressure could not be estimated.  . Shortness of breath   . Status post mitral valve repair    2001,  //  echo, October, 2010, good function of the repaired mitral valve  . Stroke ([place]) 2021   lt eye   . SVT (supraventricular tachycardia) ([place]) 03/11/2013   Isolated episodes - last 03/2013 after flu treated with adenosine cardioversion in ER.   Marland Kitchen Thyroid disease     Past Surgical History:  Procedure Laterality Date  . aneurysmal dilatation     proximal LAD cath 2001, reoeat catheter 2004, no significant change  . aneurysmal dilatiation  07/15/09   no change, normal right heart filling pressures and LVEDP- EF 45-50%  . APPENDECTOMY  1970  . BUBBLE STUDY  06/18/2019   Procedure: BUBBLE STUDY;  Surgeon: Sueanne Margarita, MD;  Location: [facility] ENDOSCOPY;  Service: Cardiovascular;;  . CARDIAC CATHETERIZATION  2010   mild global LV dysfunction EF 45-50%, no significant CAD obstruction, aneurysmal dilatation LAD  . COLONOSCOPY  2008  . COLONOSCOPY  06/2016   diverticulosis, rpt 10 yrs Ardis Hughs)  . CORONARY ANGIOPLASTY  09/1999   severe MR o/w normal limits  . CORONARY ANGIOPLASTY  12/19/02   pos. CAD, mild LV dysfunction- med treatment  . EYE SURGERY Left   . HERNIA REPAIR  01/11/05   left, Dr. Hassell Done  . I & D EXTREMITY Right 02/04/2015   Procedure: IRRIGATION AND DEBRIDEMENT RIGHT INDEX FINGER OPEN FRACTURE AND REPAIR DIGITAL NERVE AND ARTERY;  Surgeon: Leanora Cover, MD;  Location: [place];  Service: Orthopedics;   Laterality: Right;  . LOOP RECORDER INSERTION N/A 06/18/2019   Procedure: LOOP RECORDER INSERTION;  Surgeon: Deboraha Sprang, MD;  Location: [place] CV LAB;  Service: Cardiovascular;  Laterality: N/A;  . MITRAL VALVE REPAIR  01/22/00   good mitral valve function- echo 12/2008- EF 55% / nuclear 06/2009- EF 36%,? reliable? / EF 45-50%, cath rated 02/2010  . ORIF ELBOW FRACTURE     left  . POLYPECTOMY    . TEE WITHOUT CARDIOVERSION N/A 06/18/2019   Procedure: TRANSESOPHAGEAL ECHOCARDIOGRAM (TEE);  Surgeon: Sueanne Margarita, MD;  Location: [facility] ENDOSCOPY;  Service: Cardiovascular;  Laterality: N/A;  . THYROID SURGERY    . THYROIDECTOMY     left, due to goiter 1994    MEDICATIONS: . aspirin 81 MG tablet  . butalbital-acetaminophen-caffeine (FIORICET) [phone]  MG tablet  . carvedilol (COREG) 6.25 MG tablet  . ezetimibe (ZETIA) 10 MG tablet  . finasteride (PROSCAR) 5 MG tablet  . ibuprofen (ADVIL) 200 MG tablet  . Omega-3 Fatty Acids (FISH OIL) 1200 MG CAPS  . simvastatin (ZOCOR) 80 MG tablet  . tamsulosin (FLOMAX) 0.4 MG CAPS capsule   No current facility-administered medications for this encounter.     Konrad Felix, PA-C [facility]-Surgical Testing [phone]

## 2020-06-29 ENCOUNTER — Encounter (HOSPITAL_COMMUNITY)
Admission: RE | Disposition: A | Discharge status: Home / Self Care | Payer: Self-pay | Source: Home / Self Care | Attending: Orthopaedic Surgery

## 2020-06-29 ENCOUNTER — Ambulatory Visit (HOSPITAL_COMMUNITY): Payer: Medicare Other | Admitting: Anesthesiology

## 2020-06-29 ENCOUNTER — Encounter (HOSPITAL_COMMUNITY): Payer: Self-pay | Admitting: Orthopaedic Surgery

## 2020-06-29 ENCOUNTER — Telehealth: Payer: Self-pay | Admitting: Orthopaedic Surgery

## 2020-06-29 ENCOUNTER — Ambulatory Visit (HOSPITAL_COMMUNITY)
Admission: RE | Admit: 2020-06-29 | Discharge: 2020-06-29 | Disposition: A | Discharge status: Home / Self Care | Payer: Medicare Other | Attending: Orthopaedic Surgery | Admitting: Orthopaedic Surgery

## 2020-06-29 ENCOUNTER — Ambulatory Visit (HOSPITAL_COMMUNITY): Payer: Medicare Other | Admitting: Physician Assistant

## 2020-06-29 DIAGNOSIS — N400 Enlarged prostate without lower urinary tract symptoms: Secondary | ICD-10-CM | POA: Diagnosis not present

## 2020-06-29 DIAGNOSIS — Z79899 Other long term (current) drug therapy: Secondary | ICD-10-CM | POA: Diagnosis not present

## 2020-06-29 DIAGNOSIS — Z8719 Personal history of other diseases of the digestive system: Secondary | ICD-10-CM | POA: Insufficient documentation

## 2020-06-29 DIAGNOSIS — E669 Obesity, unspecified: Secondary | ICD-10-CM | POA: Insufficient documentation

## 2020-06-29 DIAGNOSIS — E785 Hyperlipidemia, unspecified: Secondary | ICD-10-CM | POA: Insufficient documentation

## 2020-06-29 DIAGNOSIS — M6281 Muscle weakness (generalized): Secondary | ICD-10-CM | POA: Diagnosis not present

## 2020-06-29 DIAGNOSIS — I080 Rheumatic disorders of both mitral and aortic valves: Secondary | ICD-10-CM | POA: Insufficient documentation

## 2020-06-29 DIAGNOSIS — Z7982 Long term (current) use of aspirin: Secondary | ICD-10-CM | POA: Insufficient documentation

## 2020-06-29 DIAGNOSIS — Z8673 Personal history of transient ischemic attack (TIA), and cerebral infarction without residual deficits: Secondary | ICD-10-CM | POA: Insufficient documentation

## 2020-06-29 DIAGNOSIS — E890 Postprocedural hypothyroidism: Secondary | ICD-10-CM | POA: Insufficient documentation

## 2020-06-29 DIAGNOSIS — M1711 Unilateral primary osteoarthritis, right knee: Secondary | ICD-10-CM | POA: Insufficient documentation

## 2020-06-29 DIAGNOSIS — Z86010 Personal history of colonic polyps: Secondary | ICD-10-CM | POA: Diagnosis not present

## 2020-06-29 DIAGNOSIS — Z8261 Family history of arthritis: Secondary | ICD-10-CM | POA: Diagnosis not present

## 2020-06-29 DIAGNOSIS — Z842 Family history of other diseases of the genitourinary system: Secondary | ICD-10-CM | POA: Diagnosis not present

## 2020-06-29 DIAGNOSIS — Z85828 Personal history of other malignant neoplasm of skin: Secondary | ICD-10-CM | POA: Insufficient documentation

## 2020-06-29 DIAGNOSIS — Z96651 Presence of right artificial knee joint: Secondary | ICD-10-CM

## 2020-06-29 DIAGNOSIS — G8918 Other acute postprocedural pain: Secondary | ICD-10-CM | POA: Diagnosis not present

## 2020-06-29 DIAGNOSIS — Z8249 Family history of ischemic heart disease and other diseases of the circulatory system: Secondary | ICD-10-CM | POA: Insufficient documentation

## 2020-06-29 DIAGNOSIS — I4510 Unspecified right bundle-branch block: Secondary | ICD-10-CM | POA: Diagnosis not present

## 2020-06-29 HISTORY — PX: TOTAL KNEE ARTHROPLASTY: SHX125

## 2020-06-29 LAB — CUP PACEART REMOTE DEVICE CHECK
Date Time Interrogation Session: 20220424230238
Implantable Pulse Generator Implant Date: 20210414

## 2020-06-29 SURGERY — TOTAL KNEE ARTHROPLASTY
Anesthesia: Spinal | Site: Knee | Laterality: Right

## 2020-06-29 MED ORDER — LIDOCAINE 2% (20 MG/ML) 5 ML SYRINGE
INTRAMUSCULAR | Status: DC | PRN
  Administered 2020-06-29: 40 mg via INTRAVENOUS

## 2020-06-29 MED ORDER — 0.9 % SODIUM CHLORIDE (POUR BTL) OPTIME
TOPICAL | Status: DC | PRN
  Administered 2020-06-29: 1000 mL

## 2020-06-29 MED ORDER — OXYCODONE-ACETAMINOPHEN 5-325 MG PO TABS: 1.0000 | ORAL_TABLET | Freq: Four times a day (QID) | ORAL | 0 refills | Status: DC | PRN

## 2020-06-29 MED ORDER — DROPERIDOL 2.5 MG/ML IJ SOLN
0.6250 mg | Freq: Once | INTRAMUSCULAR | Status: DC | PRN
Start: 2020-06-29 — End: 2020-06-29

## 2020-06-29 MED ORDER — POVIDONE-IODINE 10 % EX SWAB
2.0000 "application " | Freq: Once | CUTANEOUS | Status: AC
  Administered 2020-06-29: 2 via TOPICAL

## 2020-06-29 MED ORDER — PROPOFOL 1000 MG/100ML IV EMUL
INTRAVENOUS | Status: AC
  Filled 2020-06-29: qty 100

## 2020-06-29 MED ORDER — BUPIVACAINE IN DEXTROSE 0.75-8.25 % IT SOLN
INTRATHECAL | Status: DC | PRN
  Administered 2020-06-29: 2 mL via INTRATHECAL

## 2020-06-29 MED ORDER — PROPOFOL 500 MG/50ML IV EMUL
INTRAVENOUS | Status: DC | PRN
  Administered 2020-06-29: 85 ug/kg/min via INTRAVENOUS

## 2020-06-29 MED ORDER — CEFAZOLIN SODIUM-DEXTROSE 2-4 GM/100ML-% IV SOLN
2.0000 g | INTRAVENOUS | Status: AC
  Administered 2020-06-29: 2 g via INTRAVENOUS
  Filled 2020-06-29: qty 100

## 2020-06-29 MED ORDER — LIDOCAINE 2% (20 MG/ML) 5 ML SYRINGE
INTRAMUSCULAR | Status: AC
  Filled 2020-06-29: qty 5

## 2020-06-29 MED ORDER — OXYCODONE-ACETAMINOPHEN 5-325 MG PO TABS: 1.0000 | ORAL_TABLET | Freq: Four times a day (QID) | ORAL | Status: DC | PRN

## 2020-06-29 MED ORDER — CLONIDINE HCL (ANALGESIA) 100 MCG/ML EP SOLN
EPIDURAL | Status: DC | PRN
  Administered 2020-06-29: 80 ug

## 2020-06-29 MED ORDER — DEXAMETHASONE SODIUM PHOSPHATE 10 MG/ML IJ SOLN
INTRAMUSCULAR | Status: DC | PRN
  Administered 2020-06-29: 4 mg via INTRAVENOUS

## 2020-06-29 MED ORDER — ROPIVACAINE HCL 7.5 MG/ML IJ SOLN
INTRAMUSCULAR | Status: DC | PRN
  Administered 2020-06-29: 20 mL via PERINEURAL

## 2020-06-29 MED ORDER — FENTANYL CITRATE (PF) 100 MCG/2ML IJ SOLN
INTRAMUSCULAR | Status: DC | PRN
  Administered 2020-06-29: 50 ug via INTRAVENOUS

## 2020-06-29 MED ORDER — PROPOFOL 500 MG/50ML IV EMUL
INTRAVENOUS | Status: AC
  Filled 2020-06-29: qty 50

## 2020-06-29 MED ORDER — LACTATED RINGERS IV BOLUS
250.0000 mL | Freq: Once | INTRAVENOUS | Status: AC
  Administered 2020-06-29: 250 mL via INTRAVENOUS

## 2020-06-29 MED ORDER — PROPOFOL 10 MG/ML IV BOLUS
INTRAVENOUS | Status: AC
  Filled 2020-06-29: qty 40

## 2020-06-29 MED ORDER — LACTATED RINGERS IV BOLUS
500.0000 mL | Freq: Once | INTRAVENOUS | Status: AC
  Administered 2020-06-29: 500 mL via INTRAVENOUS

## 2020-06-29 MED ORDER — ONDANSETRON HCL 4 MG/2ML IJ SOLN
INTRAMUSCULAR | Status: DC | PRN
  Administered 2020-06-29: 4 mg via INTRAVENOUS

## 2020-06-29 MED ORDER — STERILE WATER FOR IRRIGATION IR SOLN
Status: DC | PRN
  Administered 2020-06-29 (×2): 1000 mL

## 2020-06-29 MED ORDER — TRANEXAMIC ACID-NACL 1000-0.7 MG/100ML-% IV SOLN
1000.0000 mg | INTRAVENOUS | Status: AC
  Administered 2020-06-29: 1000 mg via INTRAVENOUS
  Filled 2020-06-29: qty 100

## 2020-06-29 MED ORDER — CEFAZOLIN SODIUM-DEXTROSE 2-4 GM/100ML-% IV SOLN
2.0000 g | Freq: Four times a day (QID) | INTRAVENOUS | Status: DC
  Administered 2020-06-29: 2 g via INTRAVENOUS

## 2020-06-29 MED ORDER — TRANEXAMIC ACID-NACL 1000-0.7 MG/100ML-% IV SOLN: 1000.0000 mg | Freq: Once | INTRAVENOUS | 0 refills | Status: AC

## 2020-06-29 MED ORDER — BUPIVACAINE-EPINEPHRINE 0.25% -1:200000 IJ SOLN
INTRAMUSCULAR | Status: DC | PRN
  Administered 2020-06-29: 30 mL

## 2020-06-29 MED ORDER — BUPIVACAINE-EPINEPHRINE (PF) 0.25% -1:200000 IJ SOLN
INTRAMUSCULAR | Status: AC
  Filled 2020-06-29: qty 30

## 2020-06-29 MED ORDER — POVIDONE-IODINE 10 % EX SWAB: 2.0000 "application " | Freq: Once | CUTANEOUS | Status: DC

## 2020-06-29 MED ORDER — SODIUM CHLORIDE 0.9 % IV SOLN: INTRAVENOUS | Status: DC

## 2020-06-29 MED ORDER — MIDAZOLAM HCL 5 MG/5ML IJ SOLN
INTRAMUSCULAR | Status: DC | PRN
  Administered 2020-06-29 (×2): 1 mg via INTRAVENOUS

## 2020-06-29 MED ORDER — PHENYLEPHRINE HCL-NACL 10-0.9 MG/250ML-% IV SOLN
INTRAVENOUS | Status: DC | PRN
  Administered 2020-06-29: 25 ug/min via INTRAVENOUS

## 2020-06-29 MED ORDER — DEXAMETHASONE SODIUM PHOSPHATE 4 MG/ML IJ SOLN
INTRAMUSCULAR | Status: DC | PRN
  Administered 2020-06-29: 5 mg via PERINEURAL

## 2020-06-29 MED ORDER — ORAL CARE MOUTH RINSE: 15.0000 mL | Freq: Once | OROMUCOSAL | Status: AC

## 2020-06-29 MED ORDER — SODIUM CHLORIDE 0.9 % IV SOLN
INTRAVENOUS | Status: AC
  Filled 2020-06-29: qty 10

## 2020-06-29 MED ORDER — CEFAZOLIN SODIUM-DEXTROSE 2-4 GM/100ML-% IV SOLN
INTRAVENOUS | Status: AC
  Filled 2020-06-29: qty 100

## 2020-06-29 MED ORDER — HYDROMORPHONE HCL 1 MG/ML IJ SOLN: 0.2500 mg | INTRAMUSCULAR | Status: DC | PRN

## 2020-06-29 MED ORDER — PHENYLEPHRINE HCL-NACL 10-0.9 MG/250ML-% IV SOLN
INTRAVENOUS | Status: AC
  Filled 2020-06-29: qty 500

## 2020-06-29 MED ORDER — CHLORHEXIDINE GLUCONATE 0.12 % MT SOLN
15.0000 mL | Freq: Once | OROMUCOSAL | Status: AC
  Administered 2020-06-29: 15 mL via OROMUCOSAL

## 2020-06-29 MED ORDER — DEXAMETHASONE SODIUM PHOSPHATE 10 MG/ML IJ SOLN
INTRAMUSCULAR | Status: AC
  Filled 2020-06-29: qty 1

## 2020-06-29 MED ORDER — ASPIRIN 81 MG PO TABS: 81.0000 mg | ORAL_TABLET | Freq: Two times a day (BID) | ORAL | 0 refills | Status: DC

## 2020-06-29 MED ORDER — MIDAZOLAM HCL 2 MG/2ML IJ SOLN
INTRAMUSCULAR | Status: AC
  Filled 2020-06-29: qty 2

## 2020-06-29 MED ORDER — FENTANYL CITRATE (PF) 100 MCG/2ML IJ SOLN
INTRAMUSCULAR | Status: AC
  Filled 2020-06-29: qty 2

## 2020-06-29 MED ORDER — LACTATED RINGERS IV SOLN: INTRAVENOUS | Status: DC

## 2020-06-29 MED ORDER — LACTATED RINGERS IV BOLUS: 250.0000 mL | Freq: Once | INTRAVENOUS | 0 refills | Status: AC

## 2020-06-29 MED ORDER — TRANEXAMIC ACID-NACL 1000-0.7 MG/100ML-% IV SOLN
INTRAVENOUS | Status: AC
  Administered 2020-06-29: 1000 mg
  Filled 2020-06-29: qty 100

## 2020-06-29 MED ORDER — SODIUM CHLORIDE 0.9 % IR SOLN
Status: DC | PRN
  Administered 2020-06-29: 3000 mL

## 2020-06-29 MED ORDER — TRANEXAMIC ACID-NACL 1000-0.7 MG/100ML-% IV SOLN: 1000.0000 mg | Freq: Once | INTRAVENOUS | Status: DC

## 2020-06-29 MED ORDER — ONDANSETRON HCL 4 MG/2ML IJ SOLN
INTRAMUSCULAR | Status: AC
  Filled 2020-06-29: qty 2

## 2020-06-29 SURGICAL SUPPLY — 60 items
BAG DECANTER FOR FLEXI CONT (MISCELLANEOUS) ×2
BLADE SAGITTAL 25.0X1.19X90 (BLADE) ×2
BNDG ESMARK 6X9 LF (GAUZE/BANDAGES/DRESSINGS) ×2
BOWL SMART MIX CTS (DISPOSABLE) ×2
CEMENT HV SMART SET (Cement) ×4 IMPLANT
COMP FEM CEM LRG RT LCS (Orthopedic Implant) ×2 IMPLANT
COVER SURGICAL LIGHT HANDLE (MISCELLANEOUS) ×2
COVER WAND RF STERILE (DRAPES) ×2
CUFF TOURN SGL QUICK 34 (TOURNIQUET CUFF) ×2
CUFF TOURN SGL QUICK 42 (TOURNIQUET CUFF)
CUFF TRNQT CYL 34X4.125X (TOURNIQUET CUFF) ×1
DECANTER SPIKE VIAL GLASS SM (MISCELLANEOUS) ×2
DRAPE EXTREMITY T 121X128X90 (DISPOSABLE) ×2
DRAPE SHEET LG 3/4 BI-LAMINATE (DRAPES) ×4
DRSG ADAPTIC 3X8 NADH LF (GAUZE/BANDAGES/DRESSINGS) ×2
DRSG PAD ABDOMINAL 8X10 ST (GAUZE/BANDAGES/DRESSINGS) ×4
DURAPREP 26ML APPLICATOR (WOUND CARE) ×4
ELECT CAUTERY BLADE TIP 2.5 (TIP) ×2
ELECT REM PT RETURN 15FT ADLT (MISCELLANEOUS) ×2
EVACUATOR 1/8 PVC DRAIN (DRAIN)
FACESHIELD WRAPAROUND (MASK) ×4 IMPLANT
GAUZE SPONGE 4X4 12PLY STRL (GAUZE/BANDAGES/DRESSINGS) ×2
GLOVE SRG 8 PF TXTR STRL LF DI (GLOVE) ×1
GLOVE SURG LTX SZ8 (GLOVE) ×4
GLOVE SURG LTX SZ8.5 (GLOVE) ×4
GLOVE SURG UNDER POLY LF SZ8 (GLOVE) ×2
GLOVE SURG UNDER POLY LF SZ8.5 (GLOVE) ×2
GOWN STRL REUS W/ TWL LRG LVL3 (GOWN DISPOSABLE) ×2
GOWN STRL REUS W/TWL 2XL LVL3 (GOWN DISPOSABLE) ×2
GOWN STRL REUS W/TWL LRG LVL3 (GOWN DISPOSABLE) ×4
HANDPIECE INTERPULSE COAX TIP (DISPOSABLE) ×2
INSERT TIB LCS RP LRG 12.5 (Knees) ×2 IMPLANT
KIT BASIN OR (CUSTOM PROCEDURE TRAY) ×2
KIT TURNOVER KIT A (KITS) ×2
MANIFOLD NEPTUNE II (INSTRUMENTS) ×2
NEEDLE HYPO 22GX1.5 SAFETY (NEEDLE) ×2
NS IRRIG 1000ML POUR BTL (IV SOLUTION) ×2
PACK TOTAL JOINT (CUSTOM PROCEDURE TRAY) ×2
PAD ARMBOARD 7.5X6 YLW CONV (MISCELLANEOUS) ×4
PAD CAST 4YDX4 CTTN HI CHSV (CAST SUPPLIES) ×1
PADDING CAST COTTON 4X4 STRL (CAST SUPPLIES) ×2
PADDING CAST COTTON 6X4 STRL (CAST SUPPLIES) ×2
PEG PATELLA CEM MB COMP LG (Knees) ×2 IMPLANT
PIN STEINMAN FIXATION KNEE (PIN) ×2
SET HNDPC FAN SPRY TIP SCT (DISPOSABLE) ×1
STAPLER VISISTAT 35W (STAPLE) ×2
SUCTION FRAZIER HANDLE 10FR (MISCELLANEOUS) ×2
SUCTION TUBE FRAZIER 10FR DISP (MISCELLANEOUS) ×1
SURGIFLO W/THROMBIN 8M KIT (HEMOSTASIS)
SUT BONE WAX W31G (SUTURE) ×2
SUT ETHIBOND NAB CT1 #1 30IN (SUTURE) ×4
SUT MNCRL AB 3-0 PS2 18 (SUTURE) ×2
SUT VIC AB 0 CT1 27 (SUTURE) ×2
SUT VIC AB 0 CT1 27XBRD ANBCTR (SUTURE) ×1
SYR CONTROL 10ML LL (SYRINGE)
TIBIA MBT CEMENT SIZE 5 (Knees) ×2 IMPLANT
TOWEL OR 17X26 10 PK STRL BLUE (TOWEL DISPOSABLE) ×2
TOWEL OR NON WOVEN STRL DISP B (DISPOSABLE) ×2
TRAY FOLEY MTR SLVR 16FR STAT (SET/KITS/TRAYS/PACK) ×2
WRAP KNEE MAXI GEL POST OP (GAUZE/BANDAGES/DRESSINGS) ×2

## 2020-06-29 NOTE — Care Plan (Signed)
Ortho Bundle Case Management Note  Patient Details  Name: Alec Maldonado MRN: [id number] Date of Birth: 03-May-1950     Patient seen in office today during his H&P appointment with Biagio Borg, PA-C for Dr. Durward Fortes. Patient is having a right total knee replacement done on 06/29/20 at [facility] and is an Ortho bundle patient through [facility]. Agreeable to case management. Provided all post op care instructions and reviewed. He is planning on returning home with his wife helping after discharge. He has a FWW and 3in1 already in the home. Patient has already obtained CPM for home use through [facility]. No other DME needed. Anticipate a few visits of HHPT after discharge. After choice provided, referral made to [facility] (formerly [facility]). Patient has a f/u with Dr. Durward Fortes scheduled for 2 weeks post-op on 07/13/20. He wishes for [facility] [place] to be his OPPT facility. Scheduled to begin 07/07/20 with OPPT. Will continue to follow for needs.                     DME Arranged:  [facility] (Patient reports having a 3in1 and FWW in home already) DME Agency:  [employer]  HH Arranged:  PT [place] Agency:  [facility] ([facility])  Additional Comments: Please contact me with any questions of if this plan should need to change.  Jamse Arn, RN, BSN, [employer]  [phone] 06/29/2020, 4:00 PM

## 2020-06-29 NOTE — Progress Notes (Signed)
Physical Therapy Treatment Patient Details Name: Alec Maldonado MRN: [id number] DOB: 1950/08/09 Today's Date: 06/29/2020    History of Present Illness Patient is 70 y.o. male s/p Rt TKA On 06/29/20 with PMH significant for OA, SVT, Stroke, HLD, CAD, BPH, mitral valve replacement, loop recorder insertion.    PT Comments    Patient seen for follow up session to progress mobility. Patient up in recliner and required verbal cues for safe technique with RW for transfers and gait. He demonstrated improved balance and now requires min guard/supervision for transfers and gait with RW. Patient was able to ambulate ~90 feet with RW and min guard/supervision and cues for safe walker management. Patient educated on safe sequencing for stair mobility and verbalized safe guarding position for people assisting with mobility. Patient's wife demonstrated safe technique for guarding with gait and stair mobility. Patient instructed in exercises to facilitate ROM and circulation. Patient will benefit from continued skilled PT interventions to address impairments and progress towards PLOF. Patient has met mobility goals at adequate level for discharge home; will continue to follow if pt continues acute stay to progress towards Mod I goals.     Follow Up Recommendations  Follow surgeon's recommendation for DC plan and follow-up therapies;Outpatient PT     Equipment Recommendations  None recommended by PT    Recommendations for Other Services       Precautions / Restrictions Precautions Precautions: Fall Restrictions Weight Bearing Restrictions: No Other Position/Activity Restrictions: WBAT    Mobility  Bed Mobility               General bed mobility comments: Pt OOB in recliner    Transfers Overall transfer level: Needs assistance Equipment used: Rolling walker (2 wheeled) Transfers: Sit to/from Stand Sit to Stand: Min guard;Supervision         General transfer comment: cues for safe  technique, guarding/supervision for power up from recliner.  Ambulation/Gait Ambulation/Gait assistance: Min guard;Supervision Gait Distance (Feet): 90 Feet Assistive device: Rolling walker (2 wheeled) Gait Pattern/deviations: Step-to pattern;Decreased stride length;Decreased weight shift to right Gait velocity: decr   General Gait Details: Min cues for safe step pattern and proximity to RW, pt improved walker management throughout. Pt's wife present and gaurding pt safely with instruction from therapist.   Stairs Stairs: Yes Stairs assistance: Min assist;Min guard Stair Management: One rail Right;Sideways;Step to pattern Number of Stairs: 4 (2x2) General stair comments: Cues for safe stair sequencing for side step "up with good, down with bad". pt's wife provided safe guarding and verbalized understanding of walker management.   Wheelchair Mobility    Modified Rankin (Stroke Patients Only)       Balance Overall balance assessment: Needs assistance Sitting-balance support: Feet supported Sitting balance-Leahy Scale: Good     Standing balance support: During functional activity;Bilateral upper extremity supported Standing balance-Leahy Scale: Poor                              Cognition Arousal/Alertness: Awake/alert Behavior During Therapy: WFL for tasks assessed/performed Overall Cognitive Status: Within Functional Limits for tasks assessed                                        Exercises Total Joint Exercises Ankle Circles/Pumps: AROM;Both;20 reps;Seated Quad Sets: AROM;Right;5 reps;Seated Short Arc Quad: AROM;Right;Other reps (comment);Seated (3) Heel Slides: AROM;Right;Other reps (comment);Seated (3)  Hip ABduction/ADduction: AROM;Right;Other reps (comment);Seated (3) Straight Leg Raises: AROM;Right;Other reps (comment);Seated (3) Long Arc Quad: AROM;Right;Other reps (comment);Seated (1) Knee Flexion: AROM;Right;Other reps  (comment);Seated;AAROM (3)    General Comments        Pertinent Vitals/Pain Pain Assessment: 0-10 Pain Score: 4  Pain Location: Rt knee Pain Descriptors / Indicators: Aching;Discomfort Pain Intervention(s): Limited activity within patient's tolerance;Monitored during session;Repositioned    Home Living                      Prior Function            PT Goals (current goals can now be found in the care plan section) Acute Rehab PT Goals Patient Stated Goal: to go home today PT Goal Formulation: With patient/family Time For Goal Achievement: 07/06/20 Potential to Achieve Goals: Good Progress towards PT goals: Progressing toward goals    Frequency    7X/week      PT Plan Current plan remains appropriate    Co-evaluation              AM-PAC PT "6 Clicks" Mobility   Outcome Measure  Help needed turning from your back to your side while in a flat bed without using bedrails?: A Little Help needed moving from lying on your back to sitting on the side of a flat bed without using bedrails?: A Little Help needed moving to and from a bed to a chair (including a wheelchair)?: A Little Help needed standing up from a chair using your arms (e.g., wheelchair or bedside chair)?: A Little Help needed to walk in hospital room?: A Little Help needed climbing 3-5 steps with a railing? : A Little 6 Click Score: 18    End of Session Equipment Utilized During Treatment: Gait belt Activity Tolerance: Patient limited by pain Patient left: in bed;with call bell/phone within reach;with family/visitor present Nurse Communication: Mobility status PT Visit Diagnosis: Muscle weakness (generalized) (M62.81);Difficulty in walking, not elsewhere classified (R26.2)     Time: 2080-2233 PT Time Calculation (min) (ACUTE ONLY): 41 min  Charges:  $Gait Training: 23-37 mins $Therapeutic Exercise: 8-22 mins                     Verner Mould, DPT Acute Rehabilitation  Services Office [phone] Pager [phone]     Jacques Navy 06/29/2020, 4:52 PM

## 2020-06-29 NOTE — Anesthesia Procedure Notes (Signed)
Anesthesia Regional Block: Adductor canal block   Pre-Anesthetic Checklist: ,, timeout performed, Correct Patient, Correct Site, Correct Laterality, Correct Procedure, Correct Position, site marked, Risks and benefits discussed,  Surgical consent,  Pre-op evaluation,  At surgeon's request and post-op pain management  Laterality: Right  Prep: chloraprep       Needles:  Injection technique: Single-shot  Needle Type: Stimiplex     Needle Length: 9cm  Needle Gauge: 21     Additional Needles:   Procedures:,,,, ultrasound used (permanent image in chart),,,,  Narrative:  Start time: 06/29/2020 7:01 AM End time: 06/29/2020 7:16 AM Injection made incrementally with aspirations every 5 mL.  Performed by: Personally  Anesthesiologist: Nolon Nations, MD  Additional Notes: BP cuff, EKG monitors applied. Sedation begun. Artery and nerve location verified with U/S and anesthetic injected incrementally, slowly, and after negative aspirations under direct u/s guidance. Good fascial /perineural spread. Tolerated well.

## 2020-06-29 NOTE — Discharge Summary (Signed)
Alec Fears, MD   Biagio Borg, PA-C [address], [place], [place]  [address]                             [phone]  PATIENT ID: Alec Maldonado        MRN:  [id number]          DOB/AGE: September 14, 1950 / 70 y.o.    DISCHARGE SUMMARY  ADMISSION DATE:    06/29/2020 DISCHARGE DATE:   06/29/2020   ADMISSION DIAGNOSIS: RIGHT KNEE OSTEOARTHRITIS    DISCHARGE DIAGNOSIS:  RIGHT KNEE OSTEOARTHRITIS    ADDITIONAL DIAGNOSIS: Active Problems:   * No active hospital problems. *  Past Medical History:  Diagnosis Date  . Allergy   . Aortic insufficiency    Mild, echo, January, 2014  . BCC (basal cell carcinoma), back 2017   s/p excisional biospy by derm  . BPH (benign prostatic hypertrophy)   . Chest discomfort    nuclear 06/24/09, suggestion prior small inferior MI with moderate peri-infarvt ischemia versus variable diaphragmatic attenuation  . Colon polyps    colonoscopy 07/02/06, repeat in 10 years  . Coronary artery disease   . Dyslipidemia   . Dysrhythmia    supraventricular tachycardia, paplitations  . Ejection fraction    EF 55%, echo, October, 2010  //   EF 45-50%,  catheterization, 2011  . Heart murmur   . Hyperlipidemia   . Migraines    rare  . Mitral regurgitation    Mitral valve repair 2001  . Neurofibroma of foot 04/2017   s/p removal by derm  . Palpitations 07/2009    helped with carvedilol   . Pleurisy   . Postural dizziness    mild, no orthostatic changes by blood pressure check  . Pre-diabetes   . Pre-syncope    January, 2014  . Right ventricular dysfunction    Mild/moderate right ventricular dysfunction by echo, January, 2014, no tricuspid regurgitation, therefore right heart pressure could not be estimated.  . Shortness of breath   . Status post mitral valve repair    2001,  //  echo, October, 2010, good function of the repaired mitral valve  . Stroke ([facility]) 2021   lt eye   . SVT (supraventricular tachycardia) ([facility]) 03/11/2013   Isolated  episodes - last 03/2013 after flu treated with adenosine cardioversion in ER.   Marland Kitchen Thyroid disease     PROCEDURE: Procedure(s): RIGHT TOTAL KNEE ARTHROPLASTY  on 06/29/2020  CONSULTS: none    HISTORY:  Patient is a 70 y.o. male presented with a history of pain in the right knee for 1 years. Onset of symptoms was gradual starting 2 years ago with rapidly worsening course since that time. Prior procedures on the knee are none. Patient has been treated conservatively with over-the-counter NSAIDs and activity modification. Patient currently rates pain in the knee at 9 out of 10 with activity. There is pain at night. present.  They have been previously treated with: NSAIDS: Ibuprofen with no improvement  Knee injection with corticosteroid  was performed Knee injection with visco supplementation was performed Medications: Ibuprofen with mild improvement  HOSPITAL COURSE:  Alec Maldonado is a 70 y.o. admitted on 06/29/2020 and found to have a diagnosis of [place].  After appropriate laboratory studies were obtained  they were taken to the operating room on 06/29/2020 and underwent  Procedure(s): RIGHT TOTAL KNEE ARTHROPLASTY  .   They  were given perioperative antibiotics:  Anti-infectives (From admission, onward)   Start     Dose/Rate Route Frequency Ordered Stop   06/29/20 1015  ceFAZolin (ANCEF) IVPB 2g/100 mL premix        2 g 200 mL/hr over 30 Minutes Intravenous Every 6 hours 06/29/20 1013 06/29/20 2214   06/29/20 0600  ceFAZolin (ANCEF) IVPB 2g/100 mL premix        2 g 200 mL/hr over 30 Minutes Intravenous On call to O.R. 06/29/20 [id number] 06/29/20 0725    .  Tolerated the procedure well.  .  The remainder of the hospital course was dedicated to ambulation and strengthening.   The patient was discharged on Day of Surgery in  Stable condition.  Blood products given:none  DIAGNOSTIC STUDIES: Recent vital signs:  Patient Vitals for the past 24 hrs:  BP Temp Temp src Pulse  Resp SpO2 Height Weight  06/29/20 1030 131/85 -- -- 71 (!) 23 92 % -- --  06/29/20 1015 121/77 (!) 97.3 F (36.3 C) -- 76 20 98 % -- --  06/29/20 1000 110/65 -- -- 75 15 97 % -- --  06/29/20 0953 114/72 (!) 97.1 F (36.2 C) -- 75 (!) 22 95 % -- --  06/29/20 0550 125/74 97.8 F (36.6 C) Oral 83 20 95 % 5\' 10"  (1.778 m) 109.8 kg       Recent laboratory studies: Recent Labs    06/25/20 1409  WBC 7.9  HGB 12.3*  HCT 39.1  PLT 258   Recent Labs    06/25/20 1409  NA 140  K 3.9  CL 108  CO2 23  BUN 18  CREATININE 1.07  GLUCOSE 115*  CALCIUM 9.1   Lab Results  Component Value Date   INR 1.0 06/25/2020   INR 1.0 06/16/2019   INR 1.0 ratio 07/12/2009     Recent Radiographic Studies :  DG Chest 2 View  Result Date: 06/27/2020 CLINICAL DATA:  Pre upper right knee replacement. EXAM: CHEST - 2 VIEW COMPARISON:  05/17/2020 FINDINGS: Patient is post median sternotomy. Stable mild cardiomegaly. Implanted loop recorder projects over the left chest wall. No acute airspace disease, pulmonary edema, pleural effusion or pneumothorax. Minimal lingular scarring. No acute osseous abnormalities are seen. IMPRESSION: Stable mild cardiomegaly. No acute findings. Electronically Signed   By: Keith Rake M.D.   On: 06/27/2020 21:27   XR KNEE 3 VIEW RIGHT  Result Date: 06/17/2020 Several degrees varus with joint space narrowing and areas of bone on bone   DISCHARGE INSTRUCTIONS: Discharge Instructions    CPM   Complete by: As directed    Continuous passive motion machine (CPM):      Use the CPM from 0 to 60 for 6-8 hours per day.      You may increase by 5-10 degrees per day.  You may break it up into 2 or 3 sessions per day.      Use CPM for 3-4  weeks or until you are told to stop.   CPM   Complete by: As directed    Continuous passive motion machine (CPM):      Use the CPM from 0 to 60 for 6-8 hours per day.      You may increase by 5-10 degrees per day.  You may break it up into  2 or 3 sessions per day.      Use CPM for 3-4  weeks or until you are told to stop.   Call MD /  Call 911   Complete by: As directed    If you experience chest pain or shortness of breath, CALL 911 and be transported to the hospital emergency room.  If you develope a fever above 101 F, pus (white drainage) or increased drainage or redness at the wound, or calf pain, call your surgeon's office.   Call MD / Call 911   Complete by: As directed    If you experience chest pain or shortness of breath, CALL 911 and be transported to the hospital emergency room.  If you develope a fever above 101 F, pus (white drainage) or increased drainage or redness at the wound, or calf pain, call your surgeon's office.   Change dressing   Complete by: As directed    DO NOT CHANGE YOUR DRESSING   Change dressing   Complete by: As directed    DO NOT CHANGE YOUR DRESSING   Constipation Prevention   Complete by: As directed    Drink plenty of fluids.  Prune juice may be helpful.  You may use a stool softener, such as Colace (over the counter) 100 mg twice a day.  Use MiraLax (over the counter) for constipation as needed.   Constipation Prevention   Complete by: As directed    Drink plenty of fluids.  Prune juice may be helpful.  You may use a stool softener, such as Colace (over the counter) 100 mg twice a day.  Use MiraLax (over the counter) for constipation as needed.   Diet general   Complete by: As directed    Discharge instructions   Complete by: As directed    [place] items at home which could result in a fall. This includes throw rugs or furniture in walking pathways ICE to the affected joint every three hours while awake for 30 minutes at a time, for at least the first 3-5 days, and then as needed for pain and swelling.  Continue to use ice for pain and swelling. You may notice swelling that will progress down to the foot and ankle.  This is normal after surgery.   Elevate your leg when you are not up walking on it.   Continue to use the breathing machine you got in the hospital (incentive spirometer) which will help keep your temperature down.  It is common for your temperature to cycle up and down following surgery, especially at night when you are not up moving around and exerting yourself.  The breathing machine keeps your lungs expanded and your temperature down.   DIET:  As you were doing prior to hospitalization, we recommend a well-balanced diet.  DRESSING / WOUND CARE / SHOWERING  Keep the surgical dressing until follow up.  The dressing is water proof, so you can shower without any extra covering.  IF THE DRESSING FALLS OFF or the wound gets wet inside, change the dressing with sterile gauze.  Please use good hand washing techniques before changing the dressing.  Do not use any lotions or creams on the incision until instructed by your surgeon.    ACTIVITY  Increase activity slowly as tolerated, but follow the weight bearing instructions below.   No driving for 6 weeks or until further direction given by your physician.  You cannot drive while taking narcotics.  No lifting or carrying greater than 10 lbs. until further directed by your surgeon. Avoid periods of inactivity such as sitting longer than an hour when not asleep. This helps prevent  blood clots.  You may return to work once you are authorized by your doctor.     WEIGHT BEARING   Weight bearing as tolerated with assist device (walker, cane, etc) as directed, use it as long as suggested by your surgeon or therapist, typically at least 4-6 weeks.   EXERCISES  Results after joint replacement surgery are often greatly improved when you follow the exercise, range of motion and muscle strengthening exercises prescribed by your doctor. Safety measures are also important to protect the joint from further injury. Any time any of these exercises cause you to have increased pain or swelling,  decrease what you are doing until you are comfortable again and then slowly increase them. If you have problems or questions, call your caregiver or physical therapist for advice.   Rehabilitation is important following a joint replacement. After just a few days of immobilization, the muscles of the leg can become weakened and shrink (atrophy).  These exercises are designed to build up the tone and strength of the thigh and leg muscles and to improve motion. Often times heat used for twenty to thirty minutes before working out will loosen up your tissues and help with improving the range of motion but do not use heat for the first two weeks following surgery (sometimes heat can increase post-operative swelling).   These exercises can be done on a training (exercise) mat, on the floor, on a table or on a bed. Use whatever works the best and is most comfortable for you.    Use music or television while you are exercising so that the exercises are a pleasant break in your day. This will make your life better with the exercises acting as a break in your routine that you can look forward to.   Perform all exercises about fifteen times, three times per day or as directed.  You should exercise both the operative leg and the other leg as well.  Exercises include:   Quad Sets - Tighten up the muscle on the front of the thigh (Quad) and hold for 5-10 seconds.   Straight Leg Raises - With your knee straight (if you were given a brace, keep it on), lift the leg to 60 degrees, hold for 3 seconds, and slowly lower the leg.  Perform this exercise against resistance later as your leg gets stronger.  Leg Slides: Lying on your back, slowly slide your foot toward your buttocks, bending your knee up off the floor (only go as far as is comfortable). Then slowly slide your foot back down until your leg is flat on the floor again.  Angel Wings: Lying on your back spread your legs to the side as far apart as you can without  causing discomfort.  Hamstring Strength:  Lying on your back, push your heel against the floor with your leg straight by tightening up the muscles of your buttocks.  Repeat, but this time bend your knee to a comfortable angle, and push your heel against the floor.  You may put a pillow under the heel to make it more comfortable if necessary.   A rehabilitation program following joint replacement surgery can speed recovery and prevent re-injury in the future due to weakened muscles. Contact your doctor or a physical therapist for more information on knee rehabilitation.    CONSTIPATION  Constipation is defined medically as fewer than three stools per week and severe constipation as less than one stool per week.  Even if you have a regular  bowel pattern at home, your normal regimen is likely to be disrupted due to multiple reasons following surgery.  Combination of anesthesia, postoperative narcotics, change in appetite and fluid intake all can affect your bowels.   YOU MUST use at least one of the following options; they are listed in order of increasing strength to get the job done.  They are all available over the counter, and you may need to use some, POSSIBLY even all of these options:    Drink plenty of fluids (prune juice may be helpful) and high fiber foods Colace 100 mg by mouth twice a day  Senokot for constipation as directed and as needed Dulcolax (bisacodyl), take with full glass of water  Miralax (polyethylene glycol) once or twice a day as needed.  If you have tried all these things and are unable to have a bowel movement in the first 3-4 days after surgery call either your surgeon or your primary doctor.    If you experience loose stools or diarrhea, hold the medications until you stool forms back up.  If your symptoms do not get better within 1 week or if they get worse, check with your doctor.  If you experience "the worst abdominal pain ever" or develop nausea or vomiting, please  contact the office immediately for further recommendations for treatment.   ITCHING:  If you experience itching with your medications, try taking only a single pain pill, or even half a pain pill at a time.  You can also use Benadryl over the counter for itching or also to help with sleep.   TED HOSE STOCKINGS:  Use stockings on both legs until for at least 2 weeks or as directed by physician office. They may be removed at night for sleeping.  MEDICATIONS:  See your medication summary on the "After Visit Summary" that nursing will review with you.  You may have some home medications which will be placed on hold until you complete the course of blood thinner medication.  It is important for you to complete the blood thinner medication as prescribed.  PRECAUTIONS:  If you experience chest pain or shortness of breath - call 911 immediately for transfer to the hospital emergency department.   If you develop a fever greater that 101 F, purulent drainage from wound, increased redness or drainage from wound, foul odor from the wound/dressing, or calf pain - CONTACT YOUR SURGEON.                                                   FOLLOW-UP APPOINTMENTS:  If you do not already have a post-op appointment, please call the office for an appointment to be seen by your surgeon.  Guidelines for how soon to be seen are listed in your "After Visit Summary", but are typically between 1-4 weeks after surgery.  OTHER INSTRUCTIONS:   Knee Replacement:  Do not place pillow under knee, focus on keeping the knee straight while resting. CPM instructions: 0-90 degrees, 2 hours in the morning, 2 hours in the afternoon, and 2 hours in the evening. Place foam block, curve side up under heel at all times except when in CPM or when walking.  DO NOT modify, tear, cut, or change the foam block in any way.  POST-OPERATIVE OPIOID TAPER INSTRUCTIONS: It is important to wean off of your opioid medication  as soon as possible. If you  do not need pain medication after your surgery it is ok to stop day one. Opioids include: Codeine, Hydrocodone(Norco, Vicodin), Oxycodone(Percocet, oxycontin) and hydromorphone amongst others.  Long term and even short term use of opiods can cause: Increased pain response Dependence Constipation Depression Respiratory depression And more.  Withdrawal symptoms can include Flu like symptoms Nausea, vomiting And more Techniques to manage these symptoms Hydrate well Eat regular healthy meals Stay active Use relaxation techniques(deep breathing, meditating, yoga) Do Not substitute Alcohol to help with tapering If you have been on opioids for less than two weeks and do not have pain than it is ok to stop all together.  Plan to wean off of opioids This plan should start within one week post op of your joint replacement. Maintain the same interval or time between taking each dose and first decrease the dose.  Cut the total daily intake of opioids by one tablet each day Next start to increase the time between doses. The last dose that should be eliminated is the evening dose.     MAKE SURE YOU:  Understand these instructions.  Get help right away if you are not doing well or get worse.    Thank you for letting us be a part of your medical care team.  It is a privilege we respect greatly.  We hope these instructions will help you stay on track for a fast and full recovery!   Do not put a pillow under the knee. Place it under the heel.   Complete by: As directed    Do not put a pillow under the knee. Place it under the heel.   Complete by: As directed    Driving restrictions   Complete by: As directed    No driving for 6 weeks   Driving restrictions   Complete by: As directed    No driving for 6 weeks   Increase activity slowly as tolerated   Complete by: As directed    Increase activity slowly as tolerated   Complete by: As directed    Lifting restrictions   Complete by: As  directed    No lifting for 6 weeks   Lifting restrictions   Complete by: As directed    No lifting for 6 weeks   Patient may shower   Complete by: As directed    You may shower over the brown dressing   Patient may shower   Complete by: As directed    You may shower over the brown dressing   Post-operative opioid taper instructions:   Complete by: As directed    POST-OPERATIVE OPIOID TAPER INSTRUCTIONS: It is important to wean off of your opioid medication as soon as possible. If you do not need pain medication after your surgery it is ok to stop day one. Opioids include: Codeine, Hydrocodone(Norco, Vicodin), Oxycodone(Percocet, oxycontin) and hydromorphone amongst others.  Long term and even short term use of opiods can cause: Increased pain response Dependence Constipation Depression Respiratory depression And more.  Withdrawal symptoms can include Flu like symptoms Nausea, vomiting And more Techniques to manage these symptoms Hydrate well Eat regular healthy meals Stay active Use relaxation techniques(deep breathing, meditating, yoga) Do Not substitute Alcohol to help with tapering If you have been on opioids for less than two weeks and do not have pain than it is ok to stop all together.  Plan to wean off of opioids This plan should start within one week post op  of your joint replacement. Maintain the same interval or time between taking each dose and first decrease the dose.  Cut the total daily intake of opioids by one tablet each day Next start to increase the time between doses. The last dose that should be eliminated is the evening dose.      Post-operative opioid taper instructions:   Complete by: As directed    POST-OPERATIVE OPIOID TAPER INSTRUCTIONS: It is important to wean off of your opioid medication as soon as possible. If you do not need pain medication after your surgery it is ok to stop day one. Opioids include: Codeine, Hydrocodone(Norco,  Vicodin), Oxycodone(Percocet, oxycontin) and hydromorphone amongst others.  Long term and even short term use of opiods can cause: Increased pain response Dependence Constipation Depression Respiratory depression And more.  Withdrawal symptoms can include Flu like symptoms Nausea, vomiting And more Techniques to manage these symptoms Hydrate well Eat regular healthy meals Stay active Use relaxation techniques(deep breathing, meditating, yoga) Do Not substitute Alcohol to help with tapering If you have been on opioids for less than two weeks and do not have pain than it is ok to stop all together.  Plan to wean off of opioids This plan should start within one week post op of your joint replacement. Maintain the same interval or time between taking each dose and first decrease the dose.  Cut the total daily intake of opioids by one tablet each day Next start to increase the time between doses. The last dose that should be eliminated is the evening dose.      TED hose   Complete by: As directed    Use stockings (TED hose) for 2-3 weeks on right leg.  You may remove them at night for sleeping.   TED hose   Complete by: As directed    Use stockings (TED hose) for 2-3 weeks on right leg.  You may remove them at night for sleeping.   Weight bearing as tolerated   Complete by: As directed       DISCHARGE MEDICATIONS:   Allergies as of 06/29/2020   No Known Allergies     Medication List    STOP taking these medications   butalbital-acetaminophen-caffeine 50-325-40 MG tablet Commonly known as: FIORICET   ibuprofen 200 MG tablet Commonly known as: ADVIL     TAKE these medications   aspirin 81 MG tablet Take 1 tablet (81 mg total) by mouth 2 (two) times daily with a meal. What changed: when to take this   carvedilol 6.25 MG tablet Commonly known as: COREG Take 1 tablet (6.25 mg total) by mouth 2 (two) times daily with a meal.   ezetimibe 10 MG tablet Commonly known  as: ZETIA TAKE 1 TABLET(10 MG) BY MOUTH DAILY What changed:   how much to take  how to take this  when to take this  additional instructions   finasteride 5 MG tablet Commonly known as: PROSCAR Take 1 tablet (5 mg total) by mouth daily.   Fish Oil 1200 MG Caps Take 1,200 mg by mouth daily.   lactated ringers Inject 250 mLs into the vein once for 1 dose.   oxyCODONE-acetaminophen 5-325 MG tablet Commonly known as: PERCOCET/ROXICET Take 1-2 tablets by mouth every 6 (six) hours as needed for moderate pain or severe pain.   simvastatin 80 MG tablet Commonly known as: ZOCOR TAKE 1 TABLET(80 MG) BY MOUTH DAILY   tamsulosin 0.4 MG Caps capsule Commonly known as: FLOMAX Take 2  capsules (0.8 mg total) by mouth at bedtime.   tranexamic acid 1000MG /123mL IVPB Commonly known as: CYKLOKAPRON Inject 100 mLs (1,000 mg total) into the vein once for 1 dose.            Discharge Care Instructions  (From admission, onward)         Start     Ordered   06/29/20 0000  Change dressing       Comments: DO NOT CHANGE YOUR DRESSING   06/29/20 1018   06/29/20 0000  Change dressing       Comments: DO NOT CHANGE YOUR DRESSING   06/29/20 1026   06/29/20 0000  Weight bearing as tolerated        06/29/20 1026          FOLLOW UP VISIT:    DISPOSITION:   Home  CONDITION:  Stable  INSTRUCTIONS AFTER JOINT REPLACEMENT   o Remove items at home which could result in a fall. This includes throw rugs or furniture in walking pathways o ICE to the affected joint every three hours while awake for 30 minutes at a time, for at least the first 3-5 days, and then as needed for pain and swelling.  Continue to use ice for pain and swelling. You may notice swelling that will progress down to the foot and ankle.  This is normal after surgery.  Elevate your leg when you are not up walking on it.   o Continue to use the breathing machine you got in the hospital (incentive spirometer) which will  help keep your temperature down.  It is common for your temperature to cycle up and down following surgery, especially at night when you are not up moving around and exerting yourself.  The breathing machine keeps your lungs expanded and your temperature down.   DIET:  As you were doing prior to hospitalization, we recommend a well-balanced diet.  DRESSING / WOUND CARE / SHOWERING  Keep the surgical dressing until follow up.  The dressing is water proof, so you can shower without any extra covering.  IF THE DRESSING FALLS OFF or the wound gets wet inside, change the dressing with sterile gauze.  Please use good hand washing techniques before changing the dressing.  Do not use any lotions or creams on the incision until instructed by your surgeon.    ACTIVITY  o Increase activity slowly as tolerated, but follow the weight bearing instructions below.   o No driving for 6 weeks or until further direction given by your physician.  You cannot drive while taking narcotics.  o No lifting or carrying greater than 10 lbs. until further directed by your surgeon. o Avoid periods of inactivity such as sitting longer than an hour when not asleep. This helps prevent blood clots.  o You may return to work once you are authorized by your doctor.     WEIGHT BEARING   Weight bearing as tolerated with assist device (walker, cane, etc) as directed, use it as long as suggested by your surgeon or therapist, typically at least 4-6 weeks.   EXERCISES  Results after joint replacement surgery are often greatly improved when you follow the exercise, range of motion and muscle strengthening exercises prescribed by your doctor. Safety measures are also important to protect the joint from further injury. Any time any of these exercises cause you to have increased pain or swelling, decrease what you are doing until you are comfortable again and then slowly increase them. If  you have problems or questions, call your  caregiver or physical therapist for advice.   Rehabilitation is important following a joint replacement. After just a few days of immobilization, the muscles of the leg can become weakened and shrink (atrophy).  These exercises are designed to build up the tone and strength of the thigh and leg muscles and to improve motion. Often times heat used for twenty to thirty minutes before working out will loosen up your tissues and help with improving the range of motion but do not use heat for the first two weeks following surgery (sometimes heat can increase post-operative swelling).   These exercises can be done on a training (exercise) mat, on the floor, on a table or on a bed. Use whatever works the best and is most comfortable for you.    Use music or television while you are exercising so that the exercises are a pleasant break in your day. This will make your life better with the exercises acting as a break in your routine that you can look forward to.   Perform all exercises about fifteen times, three times per day or as directed.  You should exercise both the operative leg and the other leg as well.  Exercises include:   . Quad Sets - Tighten up the muscle on the front of the thigh (Quad) and hold for 5-10 seconds.   . Straight Leg Raises - With your knee straight (if you were given a brace, keep it on), lift the leg to 60 degrees, hold for 3 seconds, and slowly lower the leg.  Perform this exercise against resistance later as your leg gets stronger.  . Leg Slides: Lying on your back, slowly slide your foot toward your buttocks, bending your knee up off the floor (only go as far as is comfortable). Then slowly slide your foot back down until your leg is flat on the floor again.  Glenard Haring Wings: Lying on your back spread your legs to the side as far apart as you can without causing discomfort.  . Hamstring Strength:  Lying on your back, push your heel against the floor with your leg straight by  tightening up the muscles of your buttocks.  Repeat, but this time bend your knee to a comfortable angle, and push your heel against the floor.  You may put a pillow under the heel to make it more comfortable if necessary.   A rehabilitation program following joint replacement surgery can speed recovery and prevent re-injury in the future due to weakened muscles. Contact your doctor or a physical therapist for more information on knee rehabilitation.    CONSTIPATION  Constipation is defined medically as fewer than three stools per week and severe constipation as less than one stool per week.  Even if you have a regular bowel pattern at home, your normal regimen is likely to be disrupted due to multiple reasons following surgery.  Combination of anesthesia, postoperative narcotics, change in appetite and fluid intake all can affect your bowels.   YOU MUST use at least one of the following options; they are listed in order of increasing strength to get the job done.  They are all available over the counter, and you may need to use some, POSSIBLY even all of these options:    Drink plenty of fluids (prune juice may be helpful) and high fiber foods Colace 100 mg by mouth twice a day  Senokot for constipation as directed and as needed Dulcolax (bisacodyl), take  with full glass of water  Miralax (polyethylene glycol) once or twice a day as needed.  If you have tried all these things and are unable to have a bowel movement in the first 3-4 days after surgery call either your surgeon or your primary doctor.    If you experience loose stools or diarrhea, hold the medications until you stool forms back up.  If your symptoms do not get better within 1 week or if they get worse, check with your doctor.  If you experience "the worst abdominal pain ever" or develop nausea or vomiting, please contact the office immediately for further recommendations for treatment.   ITCHING:  If you experience itching with  your medications, try taking only a single pain pill, or even half a pain pill at a time.  You can also use Benadryl over the counter for itching or also to help with sleep.   TED HOSE STOCKINGS:  Use stockings on both legs until for at least 2 weeks or as directed by physician office. They may be removed at night for sleeping.  MEDICATIONS:  See your medication summary on the "After Visit Summary" that nursing will review with you.  You may have some home medications which will be placed on hold until you complete the course of blood thinner medication.  It is important for you to complete the blood thinner medication as prescribed.  PRECAUTIONS:  If you experience chest pain or shortness of breath - call 911 immediately for transfer to the hospital emergency department.   If you develop a fever greater that 101 F, purulent drainage from wound, increased redness or drainage from wound, foul odor from the wound/dressing, or calf pain - CONTACT YOUR SURGEON.                                                   FOLLOW-UP APPOINTMENTS:  If you do not already have a post-op appointment, please call the office for an appointment to be seen by your surgeon.  Guidelines for how soon to be seen are listed in your "After Visit Summary", but are typically between 1-4 weeks after surgery.  OTHER INSTRUCTIONS:   Knee Replacement:  Do not place pillow under knee, focus on keeping the knee straight while resting. CPM instructions: 0-90 degrees, 2 hours in the morning, 2 hours in the afternoon, and 2 hours in the evening. Place foam block, curve side up under heel at all times except when in CPM or when walking.  DO NOT modify, tear, cut, or change the foam block in any way.  POST-OPERATIVE OPIOID TAPER INSTRUCTIONS: . It is important to wean off of your opioid medication as soon as possible. If you do not need pain medication after your surgery it is ok to stop day one. Marland Kitchen Opioids include: o Codeine,  Hydrocodone(Norco, Vicodin), Oxycodone(Percocet, oxycontin) and hydromorphone amongst others.  . Long term and even short term use of opiods can cause: o Increased pain response o Dependence o Constipation o Depression o Respiratory depression o And more.  . Withdrawal symptoms can include o Flu like symptoms o Nausea, vomiting o And more . Techniques to manage these symptoms o Hydrate well o Eat regular healthy meals o Stay active o Use relaxation techniques(deep breathing, meditating, yoga) . Do Not substitute Alcohol to help with tapering . If you  have been on opioids for less than two weeks and do not have pain than it is ok to stop all together.  . Plan to wean off of opioids o This plan should start within one week post op of your joint replacement. o Maintain the same interval or time between taking each dose and first decrease the dose.  o Cut the total daily intake of opioids by one tablet each day o Next start to increase the time between doses. o The last dose that should be eliminated is the evening dose.     MAKE SURE YOU:  . Understand these instructions.  . Get help right away if you are not doing well or get worse.    Thank you for letting us be a part of your medical care team.  It is a privilege we respect greatly.  We hope these instructions will help you stay on track for a fast and full recovery!        Alec Maldonado [place], Gem [phone]  06/29/2020 10:41 AM

## 2020-06-29 NOTE — Evaluation (Signed)
Physical Therapy Evaluation Patient Details Name: Alec Maldonado MRN: [id number] DOB: 1950-03-26 Today's Date: 06/29/2020   History of Present Illness  Patient is 70 y.o. male s/p Rt TKA On 06/29/20 with PMH significant for OA, SVT, Stroke, HLD, CAD, BPH, mitral valve replacement, loop recorder insertion.    Clinical Impression  Alec Maldonado is a 70 y.o. male POD 0 s/p Rt TKA. Patient reports independence with mobility at baseline. Patient is now limited by functional impairments (see PT problem list below) and requires min assist for transfers and gait with RW. Patient was limited by pain and poor balance 2/2 spinal block weakness, he was able to take 2 small steps forward/backward with RW and min assist. Patient returned to supine and educated on need for additional therapy session. Patient will benefit from continued skilled PT interventions to address impairments and progress towards PLOF. Acute PT will follow to progress mobility and stair training in preparation for safe discharge home.     Follow Up Recommendations Follow surgeon's recommendation for DC plan and follow-up therapies;Outpatient PT    Equipment Recommendations  None recommended by PT    Recommendations for Other Services       Precautions / Restrictions Precautions Precautions: Fall Restrictions Weight Bearing Restrictions: No Other Position/Activity Restrictions: WBAT      Mobility  Bed Mobility Overal bed mobility: Needs Assistance Bed Mobility: Supine to Sit;Sit to Supine     Supine to sit: Min guard;HOB elevated Sit to supine: Min assist   General bed mobility comments: Cues for sequencing to bring LE's off EOB and press up. pt required cues to breathe while mobility. Assist needed to raise LE's back onto EOB.    Transfers Overall transfer level: Needs assistance Equipment used: Rolling walker (2 wheeled) Transfers: Sit to/from Stand Sit to Stand: Min assist         General transfer comment: Cues  for power up from EOB and min assist to steady due to anterior lean in standing.  Ambulation/Gait Ambulation/Gait assistance: Min assist Gait Distance (Feet): 2 Feet Assistive device: Rolling walker (2 wheeled) Gait Pattern/deviations: Step-to pattern;Decreased stride length;Decreased weight shift to right Gait velocity: decr   General Gait Details: pt took 2 small steps forward/backward from EOB with min assist to steady and manual blocking at Rt knee to prevent buckling.  Stairs            Wheelchair Mobility    Modified Rankin (Stroke Patients Only)       Balance Overall balance assessment: Needs assistance Sitting-balance support: Feet supported;Single extremity supported Sitting balance-Leahy Scale: Fair     Standing balance support: During functional activity;Bilateral upper extremity supported Standing balance-Leahy Scale: Poor                               Pertinent Vitals/Pain Pain Assessment: 0-10 Pain Score: 5  Pain Location: Rt knee Pain Descriptors / Indicators: Aching;Discomfort Pain Intervention(s): Limited activity within patient's tolerance;Monitored during session;Repositioned    Home Living Family/patient expects to be discharged to:: Private residence Living Arrangements: Spouse/significant other Available Help at Discharge: Family Type of Home: House Home Access: Stairs to enter Entrance Stairs-Rails: Right Entrance Stairs-Number of Steps: 3-4 Home Layout: One level Home Equipment: Environmental consultant - 2 wheels;Cane - single point;Bedside commode      Prior Function Level of Independence: Independent               Hand Dominance  Dominant Hand: Right    Extremity/Trunk Assessment   Upper Extremity Assessment Upper Extremity Assessment: Overall WFL for tasks assessed    Lower Extremity Assessment Lower Extremity Assessment: RLE deficits/detail RLE Deficits / Details: good quad activation, no extensor lag with SLR RLE  Sensation: WNL RLE Coordination: WNL    Cervical / Trunk Assessment Cervical / Trunk Assessment: Normal  Communication   Communication: No difficulties  Cognition Arousal/Alertness: Awake/alert Behavior During Therapy: WFL for tasks assessed/performed Overall Cognitive Status: Within Functional Limits for tasks assessed                                        General Comments      Exercises     Assessment/Plan    PT Assessment Patient needs continued PT services  PT Problem List Decreased strength;Decreased range of motion;Decreased activity tolerance;Decreased balance;Decreased mobility;Decreased knowledge of use of DME;Decreased knowledge of precautions;Pain;Decreased safety awareness       PT Treatment Interventions DME instruction;Gait training;Stair training;Functional mobility training;Therapeutic activities;Balance training;Therapeutic exercise;Patient/family education    PT Goals (Current goals can be found in the Care Plan section)  Acute Rehab PT Goals Patient Stated Goal: to go home today PT Goal Formulation: With patient/family Time For Goal Achievement: 07/06/20 Potential to Achieve Goals: Good    Frequency 7X/week   Barriers to discharge        Co-evaluation               AM-PAC PT "6 Clicks" Mobility  Outcome Measure Help needed turning from your back to your side while in a flat bed without using bedrails?: A Little Help needed moving from lying on your back to sitting on the side of a flat bed without using bedrails?: A Little Help needed moving to and from a bed to a chair (including a wheelchair)?: A Little Help needed standing up from a chair using your arms (e.g., wheelchair or bedside chair)?: A Little Help needed to walk in hospital room?: A Lot Help needed climbing 3-5 steps with a railing? : A Lot 6 Click Score: 16    End of Session Equipment Utilized During Treatment: Gait belt Activity Tolerance: Patient limited  by pain Patient left: in bed;with call bell/phone within reach;with family/visitor present Nurse Communication: Mobility status PT Visit Diagnosis: Muscle weakness (generalized) (M62.81);Difficulty in walking, not elsewhere classified (R26.2)    Time: 6568-1275 PT Time Calculation (min) (ACUTE ONLY): 24 min   Charges:   PT Evaluation $PT Eval Low Complexity: 1 Low PT Treatments $Therapeutic Activity: 8-22 mins        Verner Mould, DPT Acute Rehabilitation Services Office [phone] Pager [phone]    Jacques Navy 06/29/2020, 2:18 PM

## 2020-06-29 NOTE — Telephone Encounter (Signed)
Alec Maldonado with [facility] pharmacy called stating they don't have the tranexamic acid he called in; and would like for him to either change the rx or send it to a different pharmacy.   [facility]# [phone]

## 2020-06-29 NOTE — Op Note (Signed)
The recent History & Physical has been reviewed. I have personally examined the patient today. There is no interval change to the documented History & Physical. The patient would like to proceed with the procedure.  Garald Balding 06/29/2020,  7:26 AM

## 2020-06-29 NOTE — Op Note (Signed)
PATIENT ID:      NYJAH DENIO  MRN:     [id number] DOB/AGE:    08/21/1950 / 70 y.o.       OPERATIVE REPORT    DATE OF PROCEDURE:  06/29/2020       PREOPERATIVE DIAGNOSIS: END STAGE  RIGHT KNEE OSTEOARTHRITIS                                                       Estimated body mass index is 34.72 kg/m as calculated from the following:   Height as of this encounter: 5\' 10"  (1.778 m).   Weight as of this encounter: 109.8 kg.     POSTOPERATIVE DIAGNOSIS:END STAGE RIGHT KNEE OSTEOARTHRITIS                                                                     Estimated body mass index is 34.72 kg/m as calculated from the following:   Height as of this encounter: 5\' 10"  (1.778 m).   Weight as of this encounter: 109.8 kg.     PROCEDURE:  Procedure(s): RIGHT TOTAL KNEE ARTHROPLASTY      SURGEON:  Joni Fears, MD    ASSISTANT:   Biagio Borg, PA-C   (Present and scrubbed throughout the case, critical for assistance with exposure, retraction, instrumentation, and closure.)          ANESTHESIA: regional, epidural and IV sedation     DRAINS: none :      TOURNIQUET TIME:  Total Tourniquet Time Documented: Thigh (Right) - 83 minutes Total: Thigh (Right) - 83 minutes     COMPLICATIONS:  None   CONDITION:  stable  PROCEDURE IN DETAIL:    Garald Balding 06/29/2020, 9:38 AM

## 2020-06-29 NOTE — Anesthesia Procedure Notes (Signed)
Spinal  Patient location during procedure: OR Start time: 06/29/2020 7:19 AM End time: 06/29/2020 7:28 AM Reason for block: surgical anesthesia Staffing Performed: anesthesiologist  Anesthesiologist: Nolon Nations, MD Preanesthetic Checklist Completed: patient identified, IV checked, site marked, risks and benefits discussed, surgical consent, monitors and equipment checked, pre-op evaluation and timeout performed Spinal Block Patient position: sitting Prep: DuraPrep and site prepped and draped Patient monitoring: heart rate, continuous pulse ox and blood pressure Approach: right paramedian Location: L3-4 Injection technique: single-shot Needle Needle type: Sprotte  Needle gauge: 24 G Needle length: 9 cm Additional Notes Expiration date of kit checked and confirmed. Patient tolerated procedure well, without complications.

## 2020-06-29 NOTE — Op Note (Signed)
NAME: Alec Maldonado RECORD NO: [id number] ACCOUNT NO: [id number] DATE OF BIRTH: 01/29/1951 FACILITY: [facility] LOCATION: [facility] PHYSICIAN: Vonna Kotyk. Durward Fortes, MD  Operative Report   DATE OF PROCEDURE: 06/29/2020  PREOPERATIVE DIAGNOSIS:  End-stage osteoarthritis, right knee.  POSTOPERATIVE DIAGNOSIS:  End-stage osteoarthritis, right knee.  PROCEDURE:  Right total knee arthroplasty.  SURGEON:  Vonna Kotyk. Durward Fortes, MD.  ASSISTANT:  Biagio Borg, PA-C  ANESTHESIA:  Epidural with IV sedation and adductor canal block.  COMPLICATIONS:  None.  COMPONENTS:  DePuy LCS large femoral component, #5 rotating keeled tibial tray with a 12.5 mm polyethylene bridging bearing, metal-back 3 peg rotating patella.  Components were secured with polymethylmethacrylate.  DESCRIPTION OF PROCEDURE:  The patient was met in the holding area.  He identified the right knee as the appropriate operative site and marked it accordingly.  Any questions were answered.  Anesthesia performed an adductor canal block.  The patient was transported to room #4.  The spinal anesthetic was performed without difficulty.  He was then placed comfortably in the operating room table in the supine position under IV sedation.  Nursing staff inserted a Foley catheter.  Urine was  clear.  Right lower extremity was placed in a thigh tourniquet.  Leg was then prepared with chlorhexidine scrub and DuraPrep x2 from the tourniquet to the tips of the toes.  Sterile draping was performed.  Timeout was called.  Right lower extremity was elevated and Esmarch exsanguinated with a proximal tourniquet at 325 mmHg.  A midline longitudinal incision was then made centered about the patella extending from the superior pouch to the tibial tubercle.  Via sharp dissection, incision was carried down to subcutaneous tissue.  The first layer of capsule was incised in the  midline.  A medial parapatellar incision was then made with the Bovie.  The  joint was entered.  There was a minimal clear yellow joint effusion.  The patella was everted and 180 degrees laterally, the knee flexed to 90 degrees.  There were small osteophytes along the medial and femoral condyle.  There were large areas of grade 4 chondromalacia and erosions.  There was to a lesser extent changes on  the corresponding medial tibial plateau.  There was some grade III and early grade IV changes in the lateral compartment and patella.  There was a moderate amount of beefy red synovitis.  A synovectomy was performed.  I sized a large femoral component.  First, bony cut was then made transversely in the proximal tibia with the external tibial guide.  After each bony cut on the femur and the tibia, we checked our alignment with the external jigs.  Cuts were then made on the femur using the large femoral component.  Laminar spreaders were then inserted in the medial and lateral compartment.  Medial and lateral menisci were removed as well as ACL and PCL.  A 3/4 inch curved osteotome was used to  remove the posterior osteophytes from the medial and lateral femoral condyles.  I then measured my flexion and extension gaps at 12.5.  Further cuts were then made on the femur with a 4-degree distal femoral valgus position.  Flexion and extension gaps  were perfectly symmetrical at 12.5.  MCL and LCL remained intact.  Final cut was then made for taping purposes on the femur.  Retractors were then placed around the tibia.  I measured a #5 tibial tray.  This was pinned into place.  The center hole was then made followed by the keeled  cut.  With the tibial #5 jig in place, the 12.5 mm polyethylene bridging bearing was applied  followed by the trial large femoral component.  This was reduced and through a full range of motion, had full extension, flexion well over 105 degrees without instability or malrotation of the components.  Patella was prepared by removing about 12 mm of bone, leaving 15  mm of patellar thickness.  Three holes were then made.  Trial patella was inserted and reduced and through full range of motion and perfectly stable.  Trial components were removed.  The joint was copiously irrigated with saline solution.  Final components were then impacted with polymethylmethacrylate.  We initially applied, the #5 tibial tray followed by the 12.5 mm polyethylene bridging bearing and the large femoral component.  The knee was placed in extension.  We had excellent  apposition of components to bone.  Extraneous methacrylate was removed from the periphery of the components.  Patella was applied with methacrylate and the patellar clamp.  At approximately 16 minutes the methacrylate had matured.  During this time, we performed further synovectomy and injected the deep capsule with 0.25% Marcaine with epinephrine.  At 83 minutes the tourniquet was released, we had immediate bleeding to the joint surface.  Gross bleeders and Bovie coagulated.  We had a nice dry field before closure.  The deep capsule was closed with a running #1 Ethibond suture, for the superficial  capsule with a running 2-0 Vicryl subcutaneous with 3-0 Monocryl, skin closed with skin clips.  Sterile bulky dressing was applied.  The patient was then returned to the postanesthesia recovery room without complications.   PUS D: 06/29/2020 9:51:00 am T: 06/29/2020 2:10:00 pm  JOB: [id number]/ [id number]

## 2020-06-29 NOTE — Transfer of Care (Signed)
Immediate Anesthesia Transfer of Care Note  Patient: Alec Maldonado  Procedure(s) Performed: Procedure(s): RIGHT TOTAL KNEE ARTHROPLASTY (Right)  Patient Location: PACU  Anesthesia Type:General  Level of Consciousness:  sedated, patient cooperative and responds to stimulation  Airway & Oxygen Therapy:Patient Spontanous Breathing and Patient connected to face mask oxgen  Post-op Assessment:  Report given to PACU RN and Post -op Vital signs reviewed and stable  Post vital signs:  Reviewed and stable  Last Vitals:  Vitals:   06/29/20 0550  BP: 125/74  Pulse: 83  Resp: 20  Temp: 36.6 C  SpO2: 61%    Complications: No apparent anesthesia complications

## 2020-06-30 ENCOUNTER — Encounter (HOSPITAL_COMMUNITY): Payer: Self-pay | Admitting: Orthopaedic Surgery

## 2020-06-30 ENCOUNTER — Other Ambulatory Visit: Payer: Self-pay | Admitting: Orthopaedic Surgery

## 2020-06-30 DIAGNOSIS — Z96651 Presence of right artificial knee joint: Secondary | ICD-10-CM | POA: Diagnosis not present

## 2020-06-30 DIAGNOSIS — Z471 Aftercare following joint replacement surgery: Secondary | ICD-10-CM | POA: Diagnosis not present

## 2020-06-30 DIAGNOSIS — Z85828 Personal history of other malignant neoplasm of skin: Secondary | ICD-10-CM | POA: Diagnosis not present

## 2020-06-30 DIAGNOSIS — G43909 Migraine, unspecified, not intractable, without status migrainosus: Secondary | ICD-10-CM | POA: Diagnosis not present

## 2020-06-30 DIAGNOSIS — Z86010 Personal history of colonic polyps: Secondary | ICD-10-CM | POA: Diagnosis not present

## 2020-06-30 DIAGNOSIS — Z8673 Personal history of transient ischemic attack (TIA), and cerebral infarction without residual deficits: Secondary | ICD-10-CM | POA: Diagnosis not present

## 2020-06-30 DIAGNOSIS — I2510 Atherosclerotic heart disease of native coronary artery without angina pectoris: Secondary | ICD-10-CM | POA: Diagnosis not present

## 2020-06-30 DIAGNOSIS — Z86018 Personal history of other benign neoplasm: Secondary | ICD-10-CM | POA: Diagnosis not present

## 2020-06-30 DIAGNOSIS — I351 Nonrheumatic aortic (valve) insufficiency: Secondary | ICD-10-CM | POA: Diagnosis not present

## 2020-06-30 DIAGNOSIS — H43813 Vitreous degeneration, bilateral: Secondary | ICD-10-CM | POA: Diagnosis not present

## 2020-06-30 DIAGNOSIS — I499 Cardiac arrhythmia, unspecified: Secondary | ICD-10-CM | POA: Diagnosis not present

## 2020-06-30 DIAGNOSIS — R7303 Prediabetes: Secondary | ICD-10-CM | POA: Diagnosis not present

## 2020-06-30 DIAGNOSIS — E7800 Pure hypercholesterolemia, unspecified: Secondary | ICD-10-CM | POA: Diagnosis not present

## 2020-06-30 DIAGNOSIS — Z9089 Acquired absence of other organs: Secondary | ICD-10-CM | POA: Diagnosis not present

## 2020-06-30 DIAGNOSIS — E079 Disorder of thyroid, unspecified: Secondary | ICD-10-CM | POA: Diagnosis not present

## 2020-06-30 DIAGNOSIS — M1712 Unilateral primary osteoarthritis, left knee: Secondary | ICD-10-CM | POA: Diagnosis not present

## 2020-06-30 DIAGNOSIS — Z951 Presence of aortocoronary bypass graft: Secondary | ICD-10-CM | POA: Diagnosis not present

## 2020-06-30 DIAGNOSIS — Z7982 Long term (current) use of aspirin: Secondary | ICD-10-CM | POA: Diagnosis not present

## 2020-06-30 MED ORDER — METHOCARBAMOL 500 MG PO TABS: 500.0000 mg | ORAL_TABLET | Freq: Two times a day (BID) | ORAL | 1 refills | Status: DC | PRN

## 2020-06-30 NOTE — Anesthesia Postprocedure Evaluation (Signed)
Anesthesia Post Note  Patient: Alec Maldonado  Procedure(s) Performed: RIGHT TOTAL KNEE ARTHROPLASTY (Right Knee)     Patient location during evaluation: PACU Anesthesia Type: Spinal Level of consciousness: awake and alert Pain management: pain level controlled Vital Signs Assessment: post-procedure vital signs reviewed and stable Respiratory status: spontaneous breathing and respiratory function stable Cardiovascular status: blood pressure returned to baseline and stable Postop Assessment: spinal receding Anesthetic complications: no   No complications documented.  Last Vitals:  Vitals:   06/29/20 1430 06/29/20 1545  BP: 137/78 138/72  Pulse: 85 88  Resp: 20 20  Temp: 36.5 C 36.5 C  SpO2: 97% 97%    Last Pain:  Vitals:   06/29/20 1545  TempSrc:   PainSc: 0-No pain                 Nolon Nations

## 2020-07-01 ENCOUNTER — Telehealth: Payer: Self-pay | Admitting: *Deleted

## 2020-07-01 DIAGNOSIS — Z7982 Long term (current) use of aspirin: Secondary | ICD-10-CM | POA: Diagnosis not present

## 2020-07-01 DIAGNOSIS — Z951 Presence of aortocoronary bypass graft: Secondary | ICD-10-CM | POA: Diagnosis not present

## 2020-07-01 DIAGNOSIS — Z85828 Personal history of other malignant neoplasm of skin: Secondary | ICD-10-CM | POA: Diagnosis not present

## 2020-07-01 DIAGNOSIS — Z9089 Acquired absence of other organs: Secondary | ICD-10-CM | POA: Diagnosis not present

## 2020-07-01 DIAGNOSIS — Z471 Aftercare following joint replacement surgery: Secondary | ICD-10-CM | POA: Diagnosis not present

## 2020-07-01 DIAGNOSIS — E7800 Pure hypercholesterolemia, unspecified: Secondary | ICD-10-CM | POA: Diagnosis not present

## 2020-07-01 DIAGNOSIS — I499 Cardiac arrhythmia, unspecified: Secondary | ICD-10-CM | POA: Diagnosis not present

## 2020-07-01 DIAGNOSIS — G43909 Migraine, unspecified, not intractable, without status migrainosus: Secondary | ICD-10-CM | POA: Diagnosis not present

## 2020-07-01 DIAGNOSIS — R7303 Prediabetes: Secondary | ICD-10-CM | POA: Diagnosis not present

## 2020-07-01 DIAGNOSIS — H43813 Vitreous degeneration, bilateral: Secondary | ICD-10-CM | POA: Diagnosis not present

## 2020-07-01 DIAGNOSIS — I351 Nonrheumatic aortic (valve) insufficiency: Secondary | ICD-10-CM | POA: Diagnosis not present

## 2020-07-01 DIAGNOSIS — Z86010 Personal history of colonic polyps: Secondary | ICD-10-CM | POA: Diagnosis not present

## 2020-07-01 DIAGNOSIS — E079 Disorder of thyroid, unspecified: Secondary | ICD-10-CM | POA: Diagnosis not present

## 2020-07-01 DIAGNOSIS — Z8673 Personal history of transient ischemic attack (TIA), and cerebral infarction without residual deficits: Secondary | ICD-10-CM | POA: Diagnosis not present

## 2020-07-01 DIAGNOSIS — M1712 Unilateral primary osteoarthritis, left knee: Secondary | ICD-10-CM | POA: Diagnosis not present

## 2020-07-01 DIAGNOSIS — Z86018 Personal history of other benign neoplasm: Secondary | ICD-10-CM | POA: Diagnosis not present

## 2020-07-01 DIAGNOSIS — Z96651 Presence of right artificial knee joint: Secondary | ICD-10-CM | POA: Diagnosis not present

## 2020-07-01 DIAGNOSIS — I2510 Atherosclerotic heart disease of native coronary artery without angina pectoris: Secondary | ICD-10-CM | POA: Diagnosis not present

## 2020-07-01 NOTE — Telephone Encounter (Signed)
Ortho bundle D/C call to patient.  

## 2020-07-02 ENCOUNTER — Telehealth: Payer: Self-pay | Admitting: *Deleted

## 2020-07-02 DIAGNOSIS — Z951 Presence of aortocoronary bypass graft: Secondary | ICD-10-CM | POA: Diagnosis not present

## 2020-07-02 DIAGNOSIS — Z9089 Acquired absence of other organs: Secondary | ICD-10-CM | POA: Diagnosis not present

## 2020-07-02 DIAGNOSIS — Z8673 Personal history of transient ischemic attack (TIA), and cerebral infarction without residual deficits: Secondary | ICD-10-CM | POA: Diagnosis not present

## 2020-07-02 DIAGNOSIS — E7800 Pure hypercholesterolemia, unspecified: Secondary | ICD-10-CM | POA: Diagnosis not present

## 2020-07-02 DIAGNOSIS — M1712 Unilateral primary osteoarthritis, left knee: Secondary | ICD-10-CM | POA: Diagnosis not present

## 2020-07-02 DIAGNOSIS — R7303 Prediabetes: Secondary | ICD-10-CM | POA: Diagnosis not present

## 2020-07-02 DIAGNOSIS — Z86018 Personal history of other benign neoplasm: Secondary | ICD-10-CM | POA: Diagnosis not present

## 2020-07-02 DIAGNOSIS — I2510 Atherosclerotic heart disease of native coronary artery without angina pectoris: Secondary | ICD-10-CM | POA: Diagnosis not present

## 2020-07-02 DIAGNOSIS — Z86010 Personal history of colonic polyps: Secondary | ICD-10-CM | POA: Diagnosis not present

## 2020-07-02 DIAGNOSIS — G43909 Migraine, unspecified, not intractable, without status migrainosus: Secondary | ICD-10-CM | POA: Diagnosis not present

## 2020-07-02 DIAGNOSIS — Z96651 Presence of right artificial knee joint: Secondary | ICD-10-CM | POA: Diagnosis not present

## 2020-07-02 DIAGNOSIS — Z471 Aftercare following joint replacement surgery: Secondary | ICD-10-CM | POA: Diagnosis not present

## 2020-07-02 DIAGNOSIS — H43813 Vitreous degeneration, bilateral: Secondary | ICD-10-CM | POA: Diagnosis not present

## 2020-07-02 DIAGNOSIS — Z85828 Personal history of other malignant neoplasm of skin: Secondary | ICD-10-CM | POA: Diagnosis not present

## 2020-07-02 DIAGNOSIS — I499 Cardiac arrhythmia, unspecified: Secondary | ICD-10-CM | POA: Diagnosis not present

## 2020-07-02 DIAGNOSIS — Z7982 Long term (current) use of aspirin: Secondary | ICD-10-CM | POA: Diagnosis not present

## 2020-07-02 DIAGNOSIS — E079 Disorder of thyroid, unspecified: Secondary | ICD-10-CM | POA: Diagnosis not present

## 2020-07-02 DIAGNOSIS — I351 Nonrheumatic aortic (valve) insufficiency: Secondary | ICD-10-CM | POA: Diagnosis not present

## 2020-07-02 NOTE — Telephone Encounter (Signed)
Ortho bundle call to patient to check status.

## 2020-07-05 ENCOUNTER — Ambulatory Visit: Payer: Medicare Other | Admitting: Cardiology

## 2020-07-05 DIAGNOSIS — Z96651 Presence of right artificial knee joint: Secondary | ICD-10-CM | POA: Diagnosis not present

## 2020-07-05 DIAGNOSIS — H43813 Vitreous degeneration, bilateral: Secondary | ICD-10-CM | POA: Diagnosis not present

## 2020-07-05 DIAGNOSIS — I351 Nonrheumatic aortic (valve) insufficiency: Secondary | ICD-10-CM | POA: Diagnosis not present

## 2020-07-05 DIAGNOSIS — Z9089 Acquired absence of other organs: Secondary | ICD-10-CM | POA: Diagnosis not present

## 2020-07-05 DIAGNOSIS — I2510 Atherosclerotic heart disease of native coronary artery without angina pectoris: Secondary | ICD-10-CM | POA: Diagnosis not present

## 2020-07-05 DIAGNOSIS — I499 Cardiac arrhythmia, unspecified: Secondary | ICD-10-CM | POA: Diagnosis not present

## 2020-07-05 DIAGNOSIS — E7800 Pure hypercholesterolemia, unspecified: Secondary | ICD-10-CM | POA: Diagnosis not present

## 2020-07-05 DIAGNOSIS — Z86010 Personal history of colonic polyps: Secondary | ICD-10-CM | POA: Diagnosis not present

## 2020-07-05 DIAGNOSIS — Z8673 Personal history of transient ischemic attack (TIA), and cerebral infarction without residual deficits: Secondary | ICD-10-CM | POA: Diagnosis not present

## 2020-07-05 DIAGNOSIS — Z471 Aftercare following joint replacement surgery: Secondary | ICD-10-CM | POA: Diagnosis not present

## 2020-07-05 DIAGNOSIS — M1712 Unilateral primary osteoarthritis, left knee: Secondary | ICD-10-CM | POA: Diagnosis not present

## 2020-07-05 DIAGNOSIS — G43909 Migraine, unspecified, not intractable, without status migrainosus: Secondary | ICD-10-CM | POA: Diagnosis not present

## 2020-07-05 DIAGNOSIS — E079 Disorder of thyroid, unspecified: Secondary | ICD-10-CM | POA: Diagnosis not present

## 2020-07-05 DIAGNOSIS — Z85828 Personal history of other malignant neoplasm of skin: Secondary | ICD-10-CM | POA: Diagnosis not present

## 2020-07-05 DIAGNOSIS — Z951 Presence of aortocoronary bypass graft: Secondary | ICD-10-CM | POA: Diagnosis not present

## 2020-07-05 DIAGNOSIS — R7303 Prediabetes: Secondary | ICD-10-CM | POA: Diagnosis not present

## 2020-07-05 DIAGNOSIS — Z7982 Long term (current) use of aspirin: Secondary | ICD-10-CM | POA: Diagnosis not present

## 2020-07-05 DIAGNOSIS — Z86018 Personal history of other benign neoplasm: Secondary | ICD-10-CM | POA: Diagnosis not present

## 2020-07-06 ENCOUNTER — Telehealth: Payer: Self-pay | Admitting: *Deleted

## 2020-07-06 DIAGNOSIS — G43909 Migraine, unspecified, not intractable, without status migrainosus: Secondary | ICD-10-CM | POA: Diagnosis not present

## 2020-07-06 DIAGNOSIS — Z951 Presence of aortocoronary bypass graft: Secondary | ICD-10-CM | POA: Diagnosis not present

## 2020-07-06 DIAGNOSIS — Z471 Aftercare following joint replacement surgery: Secondary | ICD-10-CM | POA: Diagnosis not present

## 2020-07-06 DIAGNOSIS — Z86010 Personal history of colonic polyps: Secondary | ICD-10-CM | POA: Diagnosis not present

## 2020-07-06 DIAGNOSIS — Z86018 Personal history of other benign neoplasm: Secondary | ICD-10-CM | POA: Diagnosis not present

## 2020-07-06 DIAGNOSIS — Z96651 Presence of right artificial knee joint: Secondary | ICD-10-CM | POA: Diagnosis not present

## 2020-07-06 DIAGNOSIS — Z8673 Personal history of transient ischemic attack (TIA), and cerebral infarction without residual deficits: Secondary | ICD-10-CM | POA: Diagnosis not present

## 2020-07-06 DIAGNOSIS — I499 Cardiac arrhythmia, unspecified: Secondary | ICD-10-CM | POA: Diagnosis not present

## 2020-07-06 DIAGNOSIS — Z85828 Personal history of other malignant neoplasm of skin: Secondary | ICD-10-CM | POA: Diagnosis not present

## 2020-07-06 DIAGNOSIS — Z7982 Long term (current) use of aspirin: Secondary | ICD-10-CM | POA: Diagnosis not present

## 2020-07-06 DIAGNOSIS — E079 Disorder of thyroid, unspecified: Secondary | ICD-10-CM | POA: Diagnosis not present

## 2020-07-06 DIAGNOSIS — R7303 Prediabetes: Secondary | ICD-10-CM | POA: Diagnosis not present

## 2020-07-06 DIAGNOSIS — Z9089 Acquired absence of other organs: Secondary | ICD-10-CM | POA: Diagnosis not present

## 2020-07-06 DIAGNOSIS — I2510 Atherosclerotic heart disease of native coronary artery without angina pectoris: Secondary | ICD-10-CM | POA: Diagnosis not present

## 2020-07-06 DIAGNOSIS — H43813 Vitreous degeneration, bilateral: Secondary | ICD-10-CM | POA: Diagnosis not present

## 2020-07-06 DIAGNOSIS — I351 Nonrheumatic aortic (valve) insufficiency: Secondary | ICD-10-CM | POA: Diagnosis not present

## 2020-07-06 DIAGNOSIS — E7800 Pure hypercholesterolemia, unspecified: Secondary | ICD-10-CM | POA: Diagnosis not present

## 2020-07-06 DIAGNOSIS — M1712 Unilateral primary osteoarthritis, left knee: Secondary | ICD-10-CM | POA: Diagnosis not present

## 2020-07-06 NOTE — Telephone Encounter (Signed)
Ortho bundle 7 day call completed. 

## 2020-07-07 DIAGNOSIS — M25561 Pain in right knee: Secondary | ICD-10-CM | POA: Diagnosis not present

## 2020-07-07 DIAGNOSIS — R262 Difficulty in walking, not elsewhere classified: Secondary | ICD-10-CM | POA: Diagnosis not present

## 2020-07-09 DIAGNOSIS — M25561 Pain in right knee: Secondary | ICD-10-CM | POA: Diagnosis not present

## 2020-07-09 DIAGNOSIS — R262 Difficulty in walking, not elsewhere classified: Secondary | ICD-10-CM | POA: Diagnosis not present

## 2020-07-12 ENCOUNTER — Encounter (INDEPENDENT_AMBULATORY_CARE_PROVIDER_SITE_OTHER): Payer: Medicare Other | Admitting: Ophthalmology

## 2020-07-13 ENCOUNTER — Other Ambulatory Visit: Payer: Self-pay

## 2020-07-13 ENCOUNTER — Encounter: Payer: Self-pay | Admitting: Orthopaedic Surgery

## 2020-07-13 ENCOUNTER — Ambulatory Visit (INDEPENDENT_AMBULATORY_CARE_PROVIDER_SITE_OTHER): Payer: Medicare Other | Admitting: Orthopaedic Surgery

## 2020-07-13 ENCOUNTER — Ambulatory Visit (INDEPENDENT_AMBULATORY_CARE_PROVIDER_SITE_OTHER): Payer: Medicare Other

## 2020-07-13 ENCOUNTER — Telehealth: Payer: Self-pay | Admitting: *Deleted

## 2020-07-13 VITALS — Ht 70.0 in | Wt 242.0 lb

## 2020-07-13 DIAGNOSIS — Z96651 Presence of right artificial knee joint: Secondary | ICD-10-CM

## 2020-07-13 DIAGNOSIS — R262 Difficulty in walking, not elsewhere classified: Secondary | ICD-10-CM | POA: Diagnosis not present

## 2020-07-13 DIAGNOSIS — M25561 Pain in right knee: Secondary | ICD-10-CM | POA: Diagnosis not present

## 2020-07-13 NOTE — Telephone Encounter (Signed)
Ortho bundle 14 day in office meeting complete.

## 2020-07-13 NOTE — Progress Notes (Signed)
Office Visit Note   Patient: Alec Maldonado           Date of Birth: [birth year]           MRN: [id number] Visit Date: 07/13/2020              Requested by: Ria Bush, MD [address] [place],  [place] [address] PCP: Ria Bush, MD   Assessment & Plan: Visit Diagnoses:  1. S/P total knee arthroplasty, right   2. Status post total right knee replacement     Plan: 2-week status post primary right total knee replacement.  Still has 13 oxycodones "left".  Doing well with physical therapy and is now walking with a cane.  He is achieved about 110 degrees of flexion according to the therapist.  He has full extension.  Wound is healing nicely without problems.  Clips removed and Steri-Strips applied.  No calf pain.  Very mild ankle edema.  Neurologically intact.  Minimal effusion and no instability.  Office 1 month.  Continue with outpatient PT  Follow-Up Instructions: Return in about 1 month (around 08/13/2020).   Orders:  Orders Placed This Encounter  Procedures  . XR KNEE 3 VIEW RIGHT   No orders of the defined types were placed in this encounter.     Procedures: No procedures performed   Clinical Data: No additional findings.   Subjective: Chief Complaint  Patient presents with  . Right Knee - Follow-up    TKA 06/29/2020  Patient presents today for follow up on his right knee. He had a right total knee arthroplasty on 06/29/2020. He is now two weeks out from surgery. Patient states that he is doing okay. He has finished home therapy.   HPI  Review of Systems   Objective: Vital Signs: Ht 5\' 10"  (1.778 m)   Wt 242 lb (109.8 kg)   BMI 34.72 kg/m   Physical Exam  Ortho Exam right knee incision is healing without a problem.  Steri-Strips were applied after removal of the surgical clips.  No calf pain.  Mild ankle edema.  Neurologically intact.  Nice capillary refill to toes.  Full extension of flexed over 90 degrees without instability.  No popliteal  pain.  Some resolving ecchymosis along the medial distal quad probably related to the tourniquet  Specialty Comments:  No specialty comments available.  Imaging: XR KNEE 3 VIEW RIGHT  Result Date: 07/13/2020 Films of the right total knee replacement were obtained in 3 projections.  Nice alignment of all the components.  There is a little lack of methacrylate along the anterior femoral flange but otherwise appears to be very well seated.  No ectopic calcification or methacrylate.  Patella is in the midline.  No roentgenographic complication    PMFS History: Patient Active Problem List   Diagnosis Date Noted  . Status post total right knee replacement 07/13/2020  . Pre-op evaluation 05/18/2020  . Bilateral primary osteoarthritis of knee 02/11/2020  . Posterior vitreous detachment, both eyes 01/13/2020  . Acute right-sided low back pain 10/19/2019  . Bowel habit changes 10/19/2019  . Perianal rash 10/19/2019  . LVH (left ventricular hypertrophy) 08/10/2019  . Central retinal artery occlusion of left eye 06/16/2019  . History 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 06/16/2019  . Obesity, Class I, BMI 30-34.9 06/16/2019  . Abnormal prostate by palpation 05/13/2019  . Primary osteoarthritis of left knee 05/06/2019  . Sciatica, right side 05/06/2019  . Medicare  annual wellness visit, subsequent 02/23/2015  . Advanced care planning/counseling discussion 02/23/2015  . Health maintenance examination 08/28/2013  . Paroxysmal SVT (supraventricular tachycardia) ([place]) 03/11/2013  . Dysplastic nevus of trunk 08/20/2012  . Aortic insufficiency   . Right ventricular dysfunction   . RBBB   . Status post mitral valve repair   . Chest discomfort   . Mitral regurgitation   . Migraines   . Exertional dyspnea 12/14/2008  . Dyslipidemia 12/13/2008  . Benign prostatic hyperplasia with lower urinary tract symptoms 05/10/2007   Past Medical History:   Diagnosis Date  . Allergy   . Aortic insufficiency    Mild, echo, January, 2014  . BCC (basal cell carcinoma), back 2017   s/p excisional biospy by derm  . BPH (benign prostatic hypertrophy)   . Chest discomfort    nuclear 06/24/09, suggestion prior small inferior MI with moderate peri-infarvt ischemia versus variable diaphragmatic attenuation  . Colon polyps    colonoscopy 07/02/06, repeat in 10 years  . Coronary artery disease   . Dyslipidemia   . Dysrhythmia    supraventricular tachycardia, paplitations  . Ejection fraction    EF 55%, echo, October, 2010  //   EF 45-50%,  catheterization, 2011  . Heart murmur   . Hyperlipidemia   . Migraines    rare  . Mitral regurgitation    Mitral valve repair 2001  . Neurofibroma of foot 04/2017   s/p removal by derm  . Palpitations 07/2009    helped with carvedilol   . Pleurisy   . Postural dizziness    mild, no orthostatic changes by blood pressure check  . Pre-diabetes   . Pre-syncope    January, 2014  . Right ventricular dysfunction    Mild/moderate right ventricular dysfunction by echo, January, 2014, no tricuspid regurgitation, therefore right heart pressure could not be estimated.  . Shortness of breath   . Status post mitral valve repair    2001,  //  echo, October, 2010, good function of the repaired mitral valve  . Stroke ([place]) 2021   lt eye   . SVT (supraventricular tachycardia) ([place]) 03/11/2013   Isolated episodes - last 03/2013 after flu treated with adenosine cardioversion in ER.   Marland Kitchen Thyroid disease     Family History  Problem Relation Age of Onset  . Alzheimer's disease Mother   . Heart disease Mother        CHF  . Arthritis Father   . Heart disease Father        leaking mitral valve  . Benign prostatic hyperplasia Father   . Heart disease Brother        murmur  . Hypertension Neg Hx   . Diabetes Neg Hx   . Depression Neg Hx   . Alcohol abuse Neg Hx   . Drug abuse Neg Hx   . Stroke Neg Hx   . Cancer Neg Hx    . Colon cancer Neg Hx   . Esophageal cancer Neg Hx   . Rectal cancer Neg Hx   . Stomach cancer Neg Hx     Past Surgical History:  Procedure Laterality Date  . aneurysmal dilatation     proximal LAD cath 2001, reoeat catheter 2004, no significant change  . aneurysmal dilatiation  07/15/09   no change, normal right heart filling pressures and LVEDP- EF 45-50%  . APPENDECTOMY  1970  . BUBBLE STUDY  06/18/2019   Procedure: BUBBLE STUDY;  Surgeon: Sueanne Margarita, MD;  Location: [facility];  Service: Cardiovascular;;  . CARDIAC CATHETERIZATION  2010   mild global LV dysfunction EF 45-50%, no significant CAD obstruction, aneurysmal dilatation LAD  . COLONOSCOPY  2008  . COLONOSCOPY  06/2016   diverticulosis, rpt 10 yrs Ardis Hughs)  . CORONARY ANGIOPLASTY  09/1999   severe MR o/w normal limits  . CORONARY ANGIOPLASTY  12/19/02   pos. CAD, mild LV dysfunction- med treatment  . EYE SURGERY Left   . HERNIA REPAIR  01/11/05   left, Dr. Hassell Done  . I & D EXTREMITY Right 02/04/2015   Procedure: IRRIGATION AND DEBRIDEMENT RIGHT INDEX FINGER OPEN FRACTURE AND REPAIR DIGITAL NERVE AND ARTERY;  Surgeon: Leanora Cover, MD;  Location: [facility];  Service: Orthopedics;  Laterality: Right;  . LOOP RECORDER INSERTION N/A 06/18/2019   Procedure: LOOP RECORDER INSERTION;  Surgeon: Deboraha Sprang, MD;  Location: [facility];  Service: Cardiovascular;  Laterality: N/A;  . MITRAL VALVE REPAIR  01/22/00   good mitral valve function- echo 12/2008- EF 55% / nuclear 06/2009- EF 36%,? reliable? / EF 45-50%, cath rated 02/2010  . ORIF ELBOW FRACTURE     left  . POLYPECTOMY    . TEE WITHOUT CARDIOVERSION N/A 06/18/2019   Procedure: TRANSESOPHAGEAL ECHOCARDIOGRAM (TEE);  Surgeon: Sueanne Margarita, MD;  Location: [facility];  Service: Cardiovascular;  Laterality: N/A;  . THYROID SURGERY    . THYROIDECTOMY     left, due to goiter 1994  . TOTAL KNEE ARTHROPLASTY Right 06/29/2020   Procedure: RIGHT TOTAL KNEE  ARTHROPLASTY;  Surgeon: Garald Balding, MD;  Location: [facility];  Service: Orthopedics;  Laterality: Right;   Social History   Occupational History  . Occupation: Artist  Tobacco Use  . Smoking status: Never Smoker  . Smokeless tobacco: Never Used  Vaping Use  . Vaping Use: Never used  Substance and Sexual Activity  . Alcohol use: No  . Drug use: No  . Sexual activity: Yes

## 2020-07-15 DIAGNOSIS — M25561 Pain in right knee: Secondary | ICD-10-CM | POA: Diagnosis not present

## 2020-07-15 DIAGNOSIS — R262 Difficulty in walking, not elsewhere classified: Secondary | ICD-10-CM | POA: Diagnosis not present

## 2020-07-16 NOTE — Progress Notes (Signed)
[facility] Summary Report / Loop Recorder 

## 2020-07-19 ENCOUNTER — Ambulatory Visit (INDEPENDENT_AMBULATORY_CARE_PROVIDER_SITE_OTHER): Payer: Medicare Other | Admitting: Family Medicine

## 2020-07-19 ENCOUNTER — Encounter: Payer: Self-pay | Admitting: Family Medicine

## 2020-07-19 ENCOUNTER — Ambulatory Visit (INDEPENDENT_AMBULATORY_CARE_PROVIDER_SITE_OTHER): Payer: Medicare Other

## 2020-07-19 VITALS — BP 114/78 | HR 84 | Temp 97.6°F | Resp 18

## 2020-07-19 VITALS — BP 144/78 | HR 84 | Temp 97.6°F | Resp 18 | Ht 70.0 in

## 2020-07-19 DIAGNOSIS — I4510 Unspecified right bundle-branch block: Secondary | ICD-10-CM

## 2020-07-19 DIAGNOSIS — Z96651 Presence of right artificial knee joint: Secondary | ICD-10-CM | POA: Diagnosis not present

## 2020-07-19 DIAGNOSIS — Z8673 Personal history of transient ischemic attack (TIA), and cerebral infarction without residual deficits: Secondary | ICD-10-CM

## 2020-07-19 DIAGNOSIS — R0602 Shortness of breath: Secondary | ICD-10-CM

## 2020-07-19 DIAGNOSIS — Z9889 Other specified postprocedural states: Secondary | ICD-10-CM

## 2020-07-19 NOTE — Patient Instructions (Addendum)
Labs today Push fluids over the next few days.  If symptoms return, go to the ER.

## 2020-07-19 NOTE — Progress Notes (Signed)
Patient ID: Alec Maldonado, male    DOB: August 26, 1950, 70 y.o.   MRN: [id number]  This visit was conducted in person.  BP (!) 144/78   Pulse 84   Temp 97.6 F (36.4 C) (Oral)   Resp 18   Ht 5\' 10"  (1.778 m)   SpO2 97%   BMI 34.72 kg/m    CC: dyspnea, not feeling well  Subjective:   HPI: Alec Maldonado is a 70 y.o. male presenting on 07/19/2020 for Dizziness ( Pt walked into clinic today requesting apt for these symptoms) and Shortness of Breath (About one hour ago, "just don't feel right)   Known h/o PSVT, cryptogenic stroke 06/2019. S/p mitral valve repair 2001.  Just had R knee replacement surgery 06/29/2020 [facility]). Painful recovery. He took aspirin 162mg  daily until 5/10, now back to 81mg  daily.   Presents today with 1d h/o - started around 1:30pm - dyspnea associated with clamminess and sweats. Notes strange sensation to chest ?tightness. Notes constipation as he's been taking oxycodone 5/325mg  Q6 hours PRN post-op. Taking stool dulcolax stool softener. Did have BM yesterday.  No fevers/chills, cough, chest pain, unexpected leg swelling, abd pain, nausea/vomiting, diarrhea, dysuria, urinary urgency.   No missed meals today - ate a sandwich for lunch, started feeling ill after this.      Relevant past medical, surgical, family and social history reviewed and updated as indicated. Interim medical history since our last visit reviewed. Allergies and medications reviewed and updated. Outpatient Medications Prior to Visit  Medication Sig Dispense Refill  . aspirin 81 MG tablet Take 1 tablet (81 mg total) by mouth 2 (two) times daily with a meal. 60 tablet 0  . carvedilol (COREG) 6.25 MG tablet Take 1 tablet (6.25 mg total) by mouth 2 (two) times daily with a meal. 180 tablet 3  . ezetimibe (ZETIA) 10 MG tablet TAKE 1 TABLET(10 MG) BY MOUTH DAILY (Patient taking differently: Take 10 mg by mouth daily.) 90 tablet 3  . finasteride (PROSCAR) 5 MG tablet Take 1 tablet (5 mg total) by  mouth daily. 90 tablet 3  . methocarbamol (ROBAXIN) 500 MG tablet Take 1 tablet (500 mg total) by mouth 2 (two) times daily as needed for muscle spasms. 40 tablet 1  . Omega-3 Fatty Acids (FISH OIL) 1200 MG CAPS Take 1,200 mg by mouth daily.    Marland Kitchen oxyCODONE-acetaminophen (PERCOCET/ROXICET) 5-325 MG tablet Take 1-2 tablets by mouth every 6 (six) hours as needed for moderate pain or severe pain. 60 tablet 0  . simvastatin (ZOCOR) 80 MG tablet TAKE 1 TABLET(80 MG) BY MOUTH DAILY 90 tablet 3  . tamsulosin (FLOMAX) 0.4 MG CAPS capsule Take 2 capsules (0.8 mg total) by mouth at bedtime. 180 capsule 3   No facility-administered medications prior to visit.     Per HPI unless specifically indicated in ROS section below Review of Systems Objective:  BP (!) 144/78   Pulse 84   Temp 97.6 F (36.4 C) (Oral)   Resp 18   Ht 5\' 10"  (1.778 m)   SpO2 97%   BMI 34.72 kg/m   Wt Readings from Last 3 Encounters:  07/13/20 242 lb (109.8 kg)  06/29/20 242 lb (109.8 kg)  06/25/20 242 lb (109.8 kg)      Physical Exam Vitals and nursing note reviewed.  Constitutional:      Appearance: Normal appearance. He is not ill-appearing.  Eyes:     Extraocular Movements: Extraocular movements intact.  Pupils: Pupils are equal, round, and reactive to light.     Comments: Conjunctival pallor  Cardiovascular:     Rate and Rhythm: Normal rate. Rhythm irregular.     Pulses: Normal pulses.     Heart sounds: Normal heart sounds. No murmur heard.   Pulmonary:     Effort: Pulmonary effort is normal. No respiratory distress.     Breath sounds: Normal breath sounds. No wheezing, rhonchi or rales.  Musculoskeletal:     Right lower leg: No edema.     Left lower leg: No edema.  Skin:    General: Skin is warm and dry.     Findings: No rash.  Neurological:     Mental Status: He is alert.  Psychiatric:        Mood and Affect: Mood normal.        Behavior: Behavior normal.       Results for orders placed or  performed in visit on 06/28/20  [facility] [place]  Result Value Ref Range   Date Time Interrogation Session [phone]    Pulse Generator Manufacturer MERM    Pulse Gen Model LNQ22 LINQ II    Pulse Gen Serial Number [id number] [facility] [place] Pulse Generator Type ICM/ILR    Implantable Pulse Generator Implant Date [id number]     EKG - RBBB, frequent PVCs (every 4th beat), largely unchanged from 06/2020.  Assessment & Plan:  This visit occurred during the SARS-CoV-2 public health emergency.  Safety protocols were in place, including screening questions prior to the visit, additional usage of staff PPE, and extensive cleaning of exam room while observing appropriate contact time as indicated for disinfecting solutions.   Problem List Items Addressed This Visit    Dyspnea - Primary    New dyspnea over last several hours associated with generalized malaise and feeling unwell, in setting of recent knee replacement surgery. Stable vital signs as well as overall stable exam. Recommend further eval to r/o PE, symptomatic anemia, cardiac issue. Given marked malaise and pallor, did recommend further evaluation at hospital today - he initially agreed however after resting in office started feeling better and now desires to go home. Will further evaluate with labwork. Red flags to seek ER care reviewed. He and wife agree with plan.       Relevant Orders   CBC with Differential/Platelet   Comprehensive metabolic panel   D-dimer, quantitative   TSH   Troponin I (High Sensitivity)   Status post mitral valve repair   RBBB   History 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   Status post total right knee replacement       No orders of the defined types were placed in this encounter.  Orders Placed This Encounter  Procedures  . CBC with Differential/Platelet  . Comprehensive metabolic panel  . D-dimer, quantitative   . TSH    Patient Instructions  Labs today Push fluids over the next few days.  If symptoms return, go to the ER.    Follow up plan: Return if symptoms worsen or fail to improve.  Ria Bush, MD

## 2020-07-19 NOTE — Progress Notes (Signed)
Pt came to the clinic today with his wife requesting to be seen due to SOB and dizziness. Pt was with his wife, Hoyle Sauer, who reported pt was not feeling well so they [place] the fire station first and they advised to go to PCP office after checking BP, which was elevated.   Assessed vital signs. Heart rhythm normal when first assessed but later became erratic. Pt has hx of Afib per pt's wife.  Pt c/o "just not feeling right."  Pt c/o SOB and dizziness started 30 to 60 minutes before arriving at the clinic but he reports he did not feel well all morning. He said he feels like he has to keep taking deep breathes and he is sweating. When inquired about dizziness, pt could not explain how he felt when he was dizzy.  Pt also reports he has had knee surgery about 3 wks ago but no problems with th surgery or changes. Pt has not had any recent changes in medications.  Discussed with PCP who agreed an EKG should be performed. EKG was performed and handed to PCP for interpretation. PCP requested pt be placed on his schedule for further assessment. Pt was placed on PCP schedule. Pt was placed in room for PCP to assess.   Assessment: Pt is clammy, sweaty and appears to be pale. No difficulty breathing or wheezing. Heart rhythm  normal at the beginning of the assessment but after the EKG it heart rhythm was erratic. Pt had a steady gate when walking to room for assessment. Pt denied any pain. No chest pain and no radiating jaw or arm pain.

## 2020-07-19 NOTE — Assessment & Plan Note (Addendum)
New dyspnea over last several hours associated with generalized malaise and feeling unwell, in setting of recent knee replacement surgery. Stable vital signs as well as overall stable exam. Recommend further eval to r/o PE, symptomatic anemia, cardiac issue. Given marked malaise and pallor, did recommend further evaluation at hospital today - he initially agreed however after resting in office started feeling better and now desires to go home. Will further evaluate with labwork. Red flags to seek ER care reviewed. He and wife agree with plan.

## 2020-07-20 ENCOUNTER — Ambulatory Visit
Admission: RE | Admit: 2020-07-20 | Discharge: 2020-07-20 | Disposition: A | Discharge status: Home / Self Care | Payer: Medicare Other | Source: Ambulatory Visit | Attending: Family Medicine | Admitting: Family Medicine

## 2020-07-20 ENCOUNTER — Other Ambulatory Visit: Payer: Self-pay | Admitting: Family Medicine

## 2020-07-20 ENCOUNTER — Other Ambulatory Visit: Payer: Self-pay

## 2020-07-20 ENCOUNTER — Telehealth: Payer: Self-pay

## 2020-07-20 DIAGNOSIS — R079 Chest pain, unspecified: Secondary | ICD-10-CM

## 2020-07-20 DIAGNOSIS — R0602 Shortness of breath: Secondary | ICD-10-CM | POA: Diagnosis not present

## 2020-07-20 LAB — CBC WITH DIFFERENTIAL/PLATELET
Basophils Absolute: 0.1 10*3/uL (ref 0.0–0.1)
Basophils Relative: 0.6 % (ref 0.0–3.0)
Eosinophils Absolute: 0.2 10*3/uL (ref 0.0–0.7)
Eosinophils Relative: 1.9 % (ref 0.0–5.0)
HCT: 33.8 % — ABNORMAL LOW (ref 39.0–52.0)
Hemoglobin: 11.4 g/dL — ABNORMAL LOW (ref 13.0–17.0)
Lymphocytes Relative: 14.5 % (ref 12.0–46.0)
Lymphs Abs: 1.3 10*3/uL (ref 0.7–4.0)
MCHC: 33.6 g/dL (ref 30.0–36.0)
MCV: 81.5 fl (ref 78.0–100.0)
Monocytes Absolute: 0.8 10*3/uL (ref 0.1–1.0)
Monocytes Relative: 8.9 % (ref 3.0–12.0)
Neutro Abs: 6.7 10*3/uL (ref 1.4–7.7)
Neutrophils Relative %: 74.1 % (ref 43.0–77.0)
Platelets: 427 10*3/uL — ABNORMAL HIGH (ref 150.0–400.0)
RBC: 4.15 Mil/uL — ABNORMAL LOW (ref 4.22–5.81)
RDW: 14.9 % (ref 11.5–15.5)
WBC: 9 10*3/uL (ref 4.0–10.5)

## 2020-07-20 LAB — COMPREHENSIVE METABOLIC PANEL
ALT: 17 U/L (ref 0–53)
AST: 14 U/L (ref 0–37)
Albumin: 4.3 g/dL (ref 3.5–5.2)
Alkaline Phosphatase: 72 U/L (ref 39–117)
BUN: 16 mg/dL (ref 6–23)
CO2: 28 mEq/L (ref 19–32)
Calcium: 9.6 mg/dL (ref 8.4–10.5)
Chloride: 101 mEq/L (ref 96–112)
Creatinine, Ser: 1.08 mg/dL (ref 0.40–1.50)
GFR: 69.47 mL/min (ref >60.00)
Glucose, Bld: 118 mg/dL — ABNORMAL HIGH (ref 70–99)
Potassium: 4.4 mEq/L (ref 3.5–5.1)
Sodium: 138 mEq/L (ref 135–145)
Total Bilirubin: 0.6 mg/dL (ref 0.2–1.2)
Total Protein: 6.7 g/dL (ref 6.0–8.3)

## 2020-07-20 LAB — TSH: TSH: 2.03 u[IU]/mL (ref 0.35–4.50)

## 2020-07-20 LAB — TROPONIN I (HIGH SENSITIVITY): High Sens Troponin I: 7 ng/L (ref 2–17)

## 2020-07-20 LAB — D-DIMER, QUANTITATIVE: D-Dimer, Quant: 7.05 mcg/mL FEU — ABNORMAL HIGH (ref <0.50)

## 2020-07-20 MED ORDER — TECHNETIUM TO 99M ALBUMIN AGGREGATED
4.0000 | Freq: Once | INTRAVENOUS | Status: AC | PRN
  Administered 2020-07-20: 4.35 via INTRAVENOUS

## 2020-07-20 NOTE — Telephone Encounter (Signed)
Noted. See result note. I have ordered V/Q scan due to national contrast shortage.

## 2020-07-20 NOTE — Addendum Note (Signed)
Addended by: Kris Mouton on: 07/20/2020 09:29 AM   Modules accepted: Orders

## 2020-07-20 NOTE — Telephone Encounter (Signed)
Faxed report taken to Dr Darnell Level.

## 2020-07-20 NOTE — Progress Notes (Signed)
Per Butch Penny at [facility] in Nuclear Medicine department patient needs to have Chest Xray done before they can do the NM scan ordered. Order put in and patient advised to be at [facility] between 12 and 12:30 pm today.

## 2020-07-20 NOTE — Telephone Encounter (Signed)
[place] [facility] - Client TELEPHONE ADVICE RECORD [facility] Patient Name: Alec Maldonado Gender: Unknown DOB: 27-Dec-1950 Age: 70 Y 58 M 17 D Return Phone Number: Address: City/ State/ Zip: [place] Client [name] Night - Client Client Site [name] Physician Ria Bush - MD Contact Type Call Who Is Calling Lab Lab Name [facility] Lab Phone Number [phone] Lab Tech Name Allena Katz Reference Number [id number] S Chief Complaint Lab Result (Critical or Stat) Call Type Lab Send to RN Reason for Call Report lab results Initial Comment [place] [place] [facility] calling with urgent results Translation No Nurse Assessment Nurse: Ysidro Evert, RN, Levada Dy Date/Time (Eastern Time): 07/20/2020 7:37:10 AM Is there an on-call provider listed? ---Yes Please document the following items: Lab name Lab value (read back to lab to verify) Reference range for lab value Date and time blood was drawn Collect time of birth for bilirubin results ---D-Dimer Quantitative 7.05 collected 07/19/20 @ 1618 ordered by Dr Danise Mina Disp. Time Eilene Ghazi Time) Disposition Final User 07/20/2020 7:43:10 AM Called On-Call Provider Ysidro Evert, RN, Levada Dy 07/20/2020 7:43:55 AM Clinical Call Yes Ysidro Evert, RN, Richarda Blade DoctorName Phone DateTime Result/ Outcome Message Type Notes Billey Gosling - MD [phone] 07/20/2020 7:43:10 AM Called On Call Provider - Reached Doctor Paged Billey Gosling - MD 07/20/2020 7:43:46 AM Spoke with On Call - General Message Result Reported D-Dimer Quantitative 7.05 to Dr Quay Burow

## 2020-07-21 DIAGNOSIS — R262 Difficulty in walking, not elsewhere classified: Secondary | ICD-10-CM | POA: Diagnosis not present

## 2020-07-21 DIAGNOSIS — M25561 Pain in right knee: Secondary | ICD-10-CM | POA: Diagnosis not present

## 2020-07-22 ENCOUNTER — Telehealth: Payer: Self-pay | Admitting: *Deleted

## 2020-07-22 ENCOUNTER — Other Ambulatory Visit: Payer: Self-pay | Admitting: Orthopaedic Surgery

## 2020-07-22 MED ORDER — OXYCODONE-ACETAMINOPHEN 5-325 MG PO TABS: 1.0000 | ORAL_TABLET | ORAL | 0 refills | Status: DC | PRN

## 2020-07-22 NOTE — Telephone Encounter (Signed)
Sent via imprivata

## 2020-07-22 NOTE — Telephone Encounter (Signed)
Patient called requesting refill of pain medication. Attending OPPT and doing well. Thanks.

## 2020-07-23 DIAGNOSIS — R262 Difficulty in walking, not elsewhere classified: Secondary | ICD-10-CM | POA: Diagnosis not present

## 2020-07-23 DIAGNOSIS — M25561 Pain in right knee: Secondary | ICD-10-CM | POA: Diagnosis not present

## 2020-07-25 ENCOUNTER — Other Ambulatory Visit: Payer: Self-pay | Admitting: Family Medicine

## 2020-07-28 DIAGNOSIS — M25561 Pain in right knee: Secondary | ICD-10-CM | POA: Diagnosis not present

## 2020-07-28 DIAGNOSIS — R262 Difficulty in walking, not elsewhere classified: Secondary | ICD-10-CM | POA: Diagnosis not present

## 2020-07-29 ENCOUNTER — Telehealth: Payer: Self-pay | Admitting: *Deleted

## 2020-07-29 NOTE — Telephone Encounter (Signed)
Patient call today and is getting frustrated over amount of pain he is still in. We discussed and he states he feels good when at therapy, but as soon as he leaves pain returns to moderate to severe and doesn't know if this is normal at 1 month post-op. States it is very tight. Discussed medications and he is still taking Oxycodone for pain 1-2 per day. Only taking Robaxin (1/2 tablet) at night because he says makes him very "loopy". Ice, heat, all discussed. Any recommendations?

## 2020-07-30 ENCOUNTER — Telehealth: Payer: Self-pay | Admitting: *Deleted

## 2020-07-30 DIAGNOSIS — R262 Difficulty in walking, not elsewhere classified: Secondary | ICD-10-CM | POA: Diagnosis not present

## 2020-07-30 DIAGNOSIS — M25561 Pain in right knee: Secondary | ICD-10-CM | POA: Diagnosis not present

## 2020-07-30 NOTE — Telephone Encounter (Signed)
30 day bundle call and survey completed.

## 2020-08-02 LAB — CUP PACEART REMOTE DEVICE CHECK
Date Time Interrogation Session: 20220527230205
Implantable Pulse Generator Implant Date: 20210414

## 2020-08-03 ENCOUNTER — Ambulatory Visit (INDEPENDENT_AMBULATORY_CARE_PROVIDER_SITE_OTHER): Payer: Medicare Other

## 2020-08-03 DIAGNOSIS — I63449 Cerebral infarction due to embolism of unspecified cerebellar artery: Secondary | ICD-10-CM

## 2020-08-03 DIAGNOSIS — R262 Difficulty in walking, not elsewhere classified: Secondary | ICD-10-CM | POA: Diagnosis not present

## 2020-08-03 DIAGNOSIS — M25561 Pain in right knee: Secondary | ICD-10-CM | POA: Diagnosis not present

## 2020-08-10 DIAGNOSIS — R262 Difficulty in walking, not elsewhere classified: Secondary | ICD-10-CM | POA: Diagnosis not present

## 2020-08-10 DIAGNOSIS — M25561 Pain in right knee: Secondary | ICD-10-CM | POA: Diagnosis not present

## 2020-08-12 ENCOUNTER — Other Ambulatory Visit: Payer: Self-pay

## 2020-08-12 ENCOUNTER — Ambulatory Visit (INDEPENDENT_AMBULATORY_CARE_PROVIDER_SITE_OTHER): Payer: Medicare Other | Admitting: Orthopaedic Surgery

## 2020-08-12 ENCOUNTER — Encounter: Payer: Self-pay | Admitting: Orthopaedic Surgery

## 2020-08-12 DIAGNOSIS — Z96651 Presence of right artificial knee joint: Secondary | ICD-10-CM

## 2020-08-12 DIAGNOSIS — M170 Bilateral primary osteoarthritis of knee: Secondary | ICD-10-CM

## 2020-08-12 DIAGNOSIS — R262 Difficulty in walking, not elsewhere classified: Secondary | ICD-10-CM | POA: Diagnosis not present

## 2020-08-12 DIAGNOSIS — M25561 Pain in right knee: Secondary | ICD-10-CM | POA: Diagnosis not present

## 2020-08-12 NOTE — Progress Notes (Signed)
Office Visit Note   Patient: Alec Maldonado           Date of Birth: 1950-05-06           MRN: [id number] Visit Date: 08/12/2020              Requested by: Ria Bush, MD [address] [place],  [place] [address] PCP: Ria Bush, MD   Assessment & Plan: Visit Diagnoses:  1. Bilateral primary osteoarthritis of knee   2. Status post total right knee replacement     Plan: Mr. Rehberg is 6 weeks status post primary right total knee replacement doing very well.  Not taking any pain medicines.  Not using any ambulatory aid and continues to be followed in physical therapy.  His range of motion is 0 to at least 105 in his head as high as 120 in therapy.  No fever or chills.  I will plan to see him back in 3 months and continue to urged him to work with the strengthening exercises.  He is doing very well  Follow-Up Instructions: Return in about 3 months (around 11/12/2020).   Orders:  No orders of the defined types were placed in this encounter.  No orders of the defined types were placed in this encounter.     Procedures: No procedures performed   Clinical Data: No additional findings.   Subjective: No chief complaint on file. 6 weeks status post primary cemented right total knee replacement and doing very well.  No significant complaints other than he still has a few aches and pains.  Not using any ambulatory aid.  Denies any fever or chills shortness of breath or chest pain  HPI  Review of Systems   Objective: Vital Signs: There were no vitals taken for this visit.  Physical Exam  Ortho Exam right knee incision is healing without problem.  There is no effusion.  No opening with varus valgus stress and nice alignment of the knee.  There is a slight anterior drawer sign of about 3 mm.  No popliteal pain or calf pain.  No distal edema.  Neurologically intact  Specialty Comments:  No specialty comments available.  Imaging: No results found.   PMFS  History: Patient Active Problem List   Diagnosis Date Noted   Status post total right knee replacement 07/13/2020   Pre-op evaluation 05/18/2020   Bilateral primary osteoarthritis of knee 02/11/2020   Posterior vitreous detachment, both eyes 01/13/2020   Acute right-sided low back pain 10/19/2019   Bowel habit changes 10/19/2019   Perianal rash 10/19/2019   LVH (left ventricular hypertrophy) 08/10/2019   Central retinal artery occlusion of left eye 06/16/2019   History 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 06/16/2019   Obesity, Class I, BMI 30-34.9 06/16/2019   Abnormal prostate by palpation 05/13/2019   Primary osteoarthritis of left knee 05/06/2019   Sciatica, right side 05/06/2019   Medicare annual wellness visit, subsequent 02/23/2015   Advanced care planning/counseling discussion 02/23/2015   Health maintenance examination 08/28/2013   Paroxysmal SVT (supraventricular tachycardia) ([place]) 03/11/2013   Dysplastic nevus of trunk 08/20/2012   Aortic insufficiency    Right ventricular dysfunction    RBBB    Status post mitral valve repair    Chest discomfort    Mitral regurgitation    Migraines    Dyspnea 12/14/2008   Dyslipidemia 12/13/2008   Benign prostatic hyperplasia with lower urinary tract symptoms 05/10/2007   Past  Medical History:  Diagnosis Date   Allergy    Aortic insufficiency    Mild, echo, January, 2014   BCC (basal cell carcinoma), back 2017   s/p excisional biospy by derm   BPH (benign prostatic hypertrophy)    Chest discomfort    nuclear 06/24/09, suggestion prior small inferior MI with moderate peri-infarvt ischemia versus variable diaphragmatic attenuation   Colon polyps    colonoscopy 07/02/06, repeat in 10 years   Coronary artery disease    Dyslipidemia    Dysrhythmia    supraventricular tachycardia, paplitations   Ejection fraction    EF 55%, echo, October, 2010  //   EF 45-50%,  catheterization, 2011    Heart murmur    Hyperlipidemia    Migraines    rare   Mitral regurgitation    Mitral valve repair 2001   Neurofibroma of foot 04/2017   s/p removal by derm   Palpitations 07/2009    helped with carvedilol    Pleurisy    Postural dizziness    mild, no orthostatic changes by blood pressure check   Pre-diabetes    Pre-syncope    January, 2014   Right ventricular dysfunction    Mild/moderate right ventricular dysfunction by echo, January, 2014, no tricuspid regurgitation, therefore right heart pressure could not be estimated.   Shortness of breath    Status post mitral valve repair    2001,  //  echo, October, 2010, good function of the repaired mitral valve   Stroke [facility]) 2021   lt eye    SVT (supraventricular tachycardia) ([place] [address]) 03/11/2013   Isolated episodes - last 03/2013 after flu treated with adenosine cardioversion in ER.    Thyroid disease     Family History  Problem Relation Age of Onset   Alzheimer's disease Mother    Heart disease Mother        CHF   Arthritis Father    Heart disease Father        leaking mitral valve   Benign prostatic hyperplasia Father    Heart disease Brother        murmur   Hypertension Neg Hx    Diabetes Neg Hx    Depression Neg Hx    Alcohol abuse Neg Hx    Drug abuse Neg Hx    Stroke Neg Hx    Cancer Neg Hx    Colon cancer Neg Hx    Esophageal cancer Neg Hx    Rectal cancer Neg Hx    Stomach cancer Neg Hx     Past Surgical History:  Procedure Laterality Date   aneurysmal dilatation     proximal LAD cath 2001, reoeat catheter 2004, no significant change   aneurysmal dilatiation  07/15/09   no change, normal right heart filling pressures and LVEDP- EF 45-50%   APPENDECTOMY  1970   BUBBLE STUDY  06/18/2019   Procedure: BUBBLE STUDY;  Surgeon: Sueanne Margarita, MD;  Location: [place];  Service: Cardiovascular;;   CARDIAC CATHETERIZATION  2010   mild global LV dysfunction EF 45-50%, no significant CAD obstruction, aneurysmal  dilatation LAD   COLONOSCOPY  2008   COLONOSCOPY  06/2016   diverticulosis, rpt 10 yrs Ardis Hughs)   CORONARY ANGIOPLASTY  09/1999   severe MR o/w normal limits   CORONARY ANGIOPLASTY  12/19/02   pos. CAD, mild LV dysfunction- med treatment   EYE SURGERY Left    HERNIA REPAIR  01/11/05   left, Dr. Hassell Done   I &  D EXTREMITY Right 02/04/2015   Procedure: IRRIGATION AND DEBRIDEMENT RIGHT INDEX FINGER OPEN FRACTURE AND REPAIR DIGITAL NERVE AND ARTERY;  Surgeon: Leanora Cover, MD;  Location: [place];  Service: Orthopedics;  Laterality: Right;   LOOP RECORDER INSERTION N/A 06/18/2019   Procedure: LOOP RECORDER INSERTION;  Surgeon: Deboraha Sprang, MD;  Location: [place] [facility];  Service: Cardiovascular;  Laterality: N/A;   MITRAL VALVE REPAIR  01/22/00   good mitral valve function- echo 12/2008- EF 55% / nuclear 06/2009- EF 36%,? reliable? / EF 45-50%, cath rated 02/2010   ORIF ELBOW FRACTURE     left   POLYPECTOMY     TEE WITHOUT CARDIOVERSION N/A 06/18/2019   Procedure: TRANSESOPHAGEAL ECHOCARDIOGRAM (TEE);  Surgeon: Sueanne Margarita, MD;  Location: [facility] ENDOSCOPY;  Service: Cardiovascular;  Laterality: N/A;   THYROID SURGERY     THYROIDECTOMY     left, due to goiter [place] Right 06/29/2020   Procedure: RIGHT TOTAL KNEE ARTHROPLASTY;  Surgeon: Garald Balding, MD;  Location: [facility];  Service: Orthopedics;  Laterality: Right;   Social History   Occupational History   Occupation: Artist  Tobacco Use   Smoking status: Never   Smokeless tobacco: Never  Vaping Use   Vaping Use: Never used  Substance and Sexual Activity   Alcohol use: No   Drug use: No   Sexual activity: Yes     Garald Balding, MD   Note - This record has been created using Editor, commissioning.  Chart creation errors have been sought, but may not always  have been located. Such creation errors do not reflect on  the standard of medical care.

## 2020-08-16 DIAGNOSIS — R262 Difficulty in walking, not elsewhere classified: Secondary | ICD-10-CM | POA: Diagnosis not present

## 2020-08-16 DIAGNOSIS — M25561 Pain in right knee: Secondary | ICD-10-CM | POA: Diagnosis not present

## 2020-08-19 DIAGNOSIS — M25561 Pain in right knee: Secondary | ICD-10-CM | POA: Diagnosis not present

## 2020-08-19 DIAGNOSIS — R262 Difficulty in walking, not elsewhere classified: Secondary | ICD-10-CM | POA: Diagnosis not present

## 2020-08-22 ENCOUNTER — Other Ambulatory Visit: Payer: Self-pay | Admitting: Family Medicine

## 2020-08-23 ENCOUNTER — Other Ambulatory Visit: Payer: Self-pay | Admitting: Family Medicine

## 2020-08-23 DIAGNOSIS — R262 Difficulty in walking, not elsewhere classified: Secondary | ICD-10-CM | POA: Diagnosis not present

## 2020-08-23 DIAGNOSIS — M25561 Pain in right knee: Secondary | ICD-10-CM | POA: Diagnosis not present

## 2020-08-26 DIAGNOSIS — R262 Difficulty in walking, not elsewhere classified: Secondary | ICD-10-CM | POA: Diagnosis not present

## 2020-08-26 DIAGNOSIS — M25561 Pain in right knee: Secondary | ICD-10-CM | POA: Diagnosis not present

## 2020-08-26 NOTE — Progress Notes (Signed)
[facility] Summary Report / Loop Recorder 

## 2020-08-30 DIAGNOSIS — R262 Difficulty in walking, not elsewhere classified: Secondary | ICD-10-CM | POA: Diagnosis not present

## 2020-08-30 DIAGNOSIS — M25561 Pain in right knee: Secondary | ICD-10-CM | POA: Diagnosis not present

## 2020-09-07 ENCOUNTER — Ambulatory Visit (INDEPENDENT_AMBULATORY_CARE_PROVIDER_SITE_OTHER): Payer: Medicare Other

## 2020-09-07 DIAGNOSIS — M25561 Pain in right knee: Secondary | ICD-10-CM | POA: Diagnosis not present

## 2020-09-07 DIAGNOSIS — I63449 Cerebral infarction due to embolism of unspecified cerebellar artery: Secondary | ICD-10-CM | POA: Diagnosis not present

## 2020-09-07 DIAGNOSIS — R262 Difficulty in walking, not elsewhere classified: Secondary | ICD-10-CM | POA: Diagnosis not present

## 2020-09-08 LAB — CUP PACEART REMOTE DEVICE CHECK
Date Time Interrogation Session: 20220702000512
Implantable Pulse Generator Implant Date: 20210414

## 2020-09-10 DIAGNOSIS — M25561 Pain in right knee: Secondary | ICD-10-CM | POA: Diagnosis not present

## 2020-09-10 DIAGNOSIS — R262 Difficulty in walking, not elsewhere classified: Secondary | ICD-10-CM | POA: Diagnosis not present

## 2020-09-13 DIAGNOSIS — R262 Difficulty in walking, not elsewhere classified: Secondary | ICD-10-CM | POA: Diagnosis not present

## 2020-09-13 DIAGNOSIS — M25561 Pain in right knee: Secondary | ICD-10-CM | POA: Diagnosis not present

## 2020-09-16 DIAGNOSIS — M25561 Pain in right knee: Secondary | ICD-10-CM | POA: Diagnosis not present

## 2020-09-16 DIAGNOSIS — R262 Difficulty in walking, not elsewhere classified: Secondary | ICD-10-CM | POA: Diagnosis not present

## 2020-09-20 DIAGNOSIS — R262 Difficulty in walking, not elsewhere classified: Secondary | ICD-10-CM | POA: Diagnosis not present

## 2020-09-20 DIAGNOSIS — M25561 Pain in right knee: Secondary | ICD-10-CM | POA: Diagnosis not present

## 2020-09-27 DIAGNOSIS — R262 Difficulty in walking, not elsewhere classified: Secondary | ICD-10-CM | POA: Diagnosis not present

## 2020-09-27 DIAGNOSIS — M25561 Pain in right knee: Secondary | ICD-10-CM | POA: Diagnosis not present

## 2020-09-28 NOTE — Progress Notes (Signed)
[facility] Summary Report / Loop Recorder 

## 2020-10-01 ENCOUNTER — Telehealth: Payer: Self-pay | Admitting: *Deleted

## 2020-10-01 NOTE — Telephone Encounter (Signed)
Ortho bundle 90 day call completed. 

## 2020-10-07 LAB — CUP PACEART REMOTE DEVICE CHECK
Date Time Interrogation Session: 20220804092903
Implantable Pulse Generator Implant Date: 20210414

## 2020-10-11 ENCOUNTER — Ambulatory Visit (INDEPENDENT_AMBULATORY_CARE_PROVIDER_SITE_OTHER): Payer: Medicare Other

## 2020-10-11 DIAGNOSIS — I63449 Cerebral infarction due to embolism of unspecified cerebellar artery: Secondary | ICD-10-CM

## 2020-11-04 NOTE — Progress Notes (Signed)
[facility] Summary Report / Loop Recorder 

## 2020-11-11 ENCOUNTER — Encounter: Payer: Medicare Other | Admitting: Orthopaedic Surgery

## 2020-11-11 LAB — CUP PACEART REMOTE DEVICE CHECK
Date Time Interrogation Session: 20220908074725
Implantable Pulse Generator Implant Date: 20210414

## 2020-11-12 ENCOUNTER — Encounter: Payer: Self-pay | Admitting: Family Medicine

## 2020-11-15 ENCOUNTER — Ambulatory Visit (INDEPENDENT_AMBULATORY_CARE_PROVIDER_SITE_OTHER): Payer: Medicare Other

## 2020-11-15 DIAGNOSIS — I63449 Cerebral infarction due to embolism of unspecified cerebellar artery: Secondary | ICD-10-CM

## 2020-11-16 ENCOUNTER — Ambulatory Visit: Payer: Self-pay

## 2020-11-16 ENCOUNTER — Ambulatory Visit (INDEPENDENT_AMBULATORY_CARE_PROVIDER_SITE_OTHER): Payer: Medicare Other | Admitting: Orthopaedic Surgery

## 2020-11-16 ENCOUNTER — Encounter: Payer: Self-pay | Admitting: Orthopaedic Surgery

## 2020-11-16 ENCOUNTER — Other Ambulatory Visit: Payer: Self-pay

## 2020-11-16 VITALS — Ht 70.0 in | Wt 242.0 lb

## 2020-11-16 DIAGNOSIS — M170 Bilateral primary osteoarthritis of knee: Secondary | ICD-10-CM

## 2020-11-16 DIAGNOSIS — Z96651 Presence of right artificial knee joint: Secondary | ICD-10-CM | POA: Diagnosis not present

## 2020-11-16 NOTE — Progress Notes (Signed)
Office Visit Note   Patient: Alec Maldonado           Date of Birth: March 02, 1951           MRN: [id number] Visit Date: 11/16/2020              Requested by: Ria Bush, MD [address] [place],  [place] [address] PCP: Ria Bush, MD   Assessment & Plan: Visit Diagnoses:  1. Status post total right knee replacement   2. Bilateral primary osteoarthritis of knee     Plan: 4-1/2 months status post primary right total knee replacement and was doing well but notes that he has now had some discomfort as the day progresses and when he first gets up from a sitting position.  He denies any numbness or tingling in his lower extremities.Marland Kitchen  He is not had any fever or chills.  His exam was completely benign.  There was no knee effusion.  I had full extension but only about 95 to 100 degrees of flexion.  There is no instability.  He is lost at least 20degrees over time.  He has not been exercising and I think that is going to be important.  Discussed Long discussion regarding exercise program.  He does have some cardiac issues and just recently had an embolism in the cerebral artery that appears to have cleared according to Alec Maldonado.  Like to check him back in 3 months.  Follow-Up Instructions: Return in about 3 months (around 02/15/2021).   Orders:  Orders Placed This Encounter  Procedures   XR KNEE 3 VIEW RIGHT   No orders of the defined types were placed in this encounter.     Procedures: No procedures performed   Clinical Data: No additional findings.   Subjective: Chief Complaint  Patient presents with   Right Knee - Follow-up    Right total knee arthroplasty 06/29/2020  Patient presents today for a three month follow up on his right knee. He had a right total knee arthroplasty on 06/29/2020. He states that he is doing fair. He has swelling in his foot and pain upon getting up or down.   HPI  Review of Systems   Objective: Vital Signs: Ht '5\' 10"'$  (1.778 m)    Wt 242 lb (109.8 kg)   BMI 34.72 kg/m   Physical Exam Constitutional:      Appearance: He is well-developed.  Pulmonary:     Effort: Pulmonary effort is normal.  Skin:    General: Skin is warm and dry.  Neurological:     Mental Status: He is alert and oriented to person, place, and time.  Psychiatric:        Behavior: Behavior normal.    Ortho Exam awake alert and oriented x3.  Comfortable sitting.  Had little bit of a limp when he first got up from a sitting position in reference to his right knee.  Actively he lacked the little to full extension in the right knee but passively I can get it fully extended.  There is no opening with varus valgus stress or anterior drawer sign.  The knee was a little warm but not pathologically.  There was no effusion.  I only had about 95 to 100 degrees of flexion passively and actively.  There is no calf pain.  Mild pitting edema both ankles  Specialty Comments:  No specialty comments available.  Imaging: XR KNEE 3 VIEW RIGHT  Result Date: 11/16/2020 Was of the  right total knee replacement were obtained in 3 projections.  Patella tracks in the midline.  Normal alignment.  Nice glue mantle and no evidence of any complications.  There is no fracture.  Patient has been experiencing some discomfort in his knee but without any obvious x-ray changes    PMFS History: Patient Active Problem List   Diagnosis Date Noted   Status post total right knee replacement 07/13/2020   Pre-op evaluation 05/18/2020   Bilateral primary osteoarthritis of knee 02/11/2020   Posterior vitreous detachment, both eyes 01/13/2020   Acute right-sided low back pain 10/19/2019   Bowel habit changes 10/19/2019   Perianal rash 10/19/2019   LVH (left ventricular hypertrophy) 08/10/2019   Central retinal artery occlusion of left eye 06/16/2019   History 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 06/16/2019   Obesity, Class I, BMI 30-34.9  06/16/2019   Abnormal prostate by palpation 05/13/2019   Primary osteoarthritis of left knee 05/06/2019   Sciatica, right side 05/06/2019   Medicare annual wellness visit, subsequent 02/23/2015   Advanced care planning/counseling discussion 02/23/2015   Health maintenance examination 08/28/2013   Paroxysmal SVT (supraventricular tachycardia) ([place]) 03/11/2013   Dysplastic nevus of trunk 08/20/2012   Aortic insufficiency    Right ventricular dysfunction    RBBB    Status post mitral valve repair    Chest discomfort    Mitral regurgitation    Migraines    Dyspnea 12/14/2008   Dyslipidemia 12/13/2008   Benign prostatic hyperplasia with lower urinary tract symptoms 05/10/2007   Past Medical History:  Diagnosis Date   Allergy    Aortic insufficiency    Mild, echo, January, 2014   BCC (basal cell carcinoma), back 2017   s/p excisional biospy by derm   BPH (benign prostatic hypertrophy)    Central retinal artery occlusion of left eye 06/16/2019   S/p ER eval 06/2019 Saw neurology Leonie Man) - left eye vision loss due to central retinal artery occlusion in April 2021, MRI scan showing silent cerebral embolic infarcts of cryptogenic etiology   Chest discomfort    nuclear 06/24/09, suggestion prior small inferior MI with moderate peri-infarvt ischemia versus variable diaphragmatic attenuation   Colon polyps    colonoscopy 07/02/06, repeat in 10 years   Coronary artery disease    Dyslipidemia    Dysrhythmia    supraventricular tachycardia, paplitations   Ejection fraction    EF 55%, echo, October, 2010  //   EF 45-50%,  catheterization, 2011   Heart murmur    History 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 TEE 06/2019 IMPRESSION: 1. Left ventricular ejection fraction, by estimation, is 50 to 55%. The left ventricle has low normal function. The  left ventricle has no regional wall motion abnormalities. There is severe asymmetric left ventricular hypertrophy 2. Right ventricular systolic function is normal.   Hyperlipidemia    Migraines    rare   Mitral regurgitation    Mitral valve repair 2001   Neurofibroma of foot 04/2017   s/p removal by derm   Palpitations 07/2009    helped with carvedilol    Pleurisy    Postural dizziness    mild, no orthostatic changes by blood pressure check   Pre-diabetes    Pre-syncope    January, 2014   Right ventricular dysfunction    Mild/moderate right ventricular dysfunction by echo,  January, 2014, no tricuspid regurgitation, therefore right heart pressure could not be estimated.   Shortness of breath    Status post mitral valve repair    2001,  //  echo, October, 2010, good function of the repaired mitral valve   Stroke [facility]) 2021   lt eye    SVT (supraventricular tachycardia) ([place]) 03/11/2013   Isolated episodes - last 03/2013 after flu treated with adenosine cardioversion in ER.    Thyroid disease     Family History  Problem Relation Age of Onset   Alzheimer's disease Mother    Heart disease Mother        CHF   Arthritis Father    Heart disease Father        leaking mitral valve   Benign prostatic hyperplasia Father    Heart disease Brother        murmur   Hypertension Neg Hx    Diabetes Neg Hx    Depression Neg Hx    Alcohol abuse Neg Hx    Drug abuse Neg Hx    Stroke Neg Hx    Cancer Neg Hx    Colon cancer Neg Hx    Esophageal cancer Neg Hx    Rectal cancer Neg Hx    Stomach cancer Neg Hx     Past Surgical History:  Procedure Laterality Date   aneurysmal dilatation     proximal LAD cath 2001, reoeat catheter 2004, no significant change   aneurysmal dilatiation  07/15/09   no change, normal right heart filling pressures and LVEDP- EF 45-50%   APPENDECTOMY  1970   BUBBLE STUDY  06/18/2019   Procedure: BUBBLE STUDY;  Surgeon: Sueanne Margarita, MD;  Location: [place];   Service: Cardiovascular;;   CARDIAC CATHETERIZATION  2010   mild global LV dysfunction EF 45-50%, no significant CAD obstruction, aneurysmal dilatation LAD   COLONOSCOPY  2008   COLONOSCOPY  06/2016   diverticulosis, rpt 10 yrs Ardis Hughs)   CORONARY ANGIOPLASTY  09/1999   severe MR o/w normal limits   CORONARY ANGIOPLASTY  12/19/02   pos. CAD, mild LV dysfunction- med treatment   EYE SURGERY Left    HERNIA REPAIR  01/11/05   left, Dr. Hassell Done   I & D EXTREMITY Right 02/04/2015   Procedure: IRRIGATION AND DEBRIDEMENT RIGHT INDEX FINGER OPEN FRACTURE AND REPAIR DIGITAL NERVE AND ARTERY;  Surgeon: Leanora Cover, MD;  Location: [place];  Service: Orthopedics;  Laterality: Right;   LOOP RECORDER INSERTION N/A 06/18/2019   Procedure: LOOP RECORDER INSERTION;  Surgeon: Deboraha Sprang, MD;  Location: [place] [facility];  Service: Cardiovascular;  Laterality: N/A;   MITRAL VALVE REPAIR  01/22/00   good mitral valve function- echo 12/2008- EF 55% / nuclear 06/2009- EF 36%,? reliable? / EF 45-50%, cath rated 02/2010   ORIF ELBOW FRACTURE     left   POLYPECTOMY     TEE WITHOUT CARDIOVERSION N/A 06/18/2019   Procedure: TRANSESOPHAGEAL ECHOCARDIOGRAM (TEE);  Surgeon: Sueanne Margarita, MD;  Location: [facility] ENDOSCOPY;  Service: Cardiovascular;  Laterality: N/A;   THYROID SURGERY     THYROIDECTOMY     left, due to goiter [place] Right 06/29/2020   Procedure: RIGHT TOTAL KNEE ARTHROPLASTY;  Surgeon: Garald Balding, MD;  Location: [facility];  Service: Orthopedics;  Laterality: Right;   Social History   Occupational History   Occupation: Artist  Tobacco Use   Smoking status: Never   Smokeless tobacco:  Never  Vaping Use   Vaping Use: Never used  Substance and Sexual Activity   Alcohol use: No   Drug use: No   Sexual activity: Yes

## 2020-11-24 NOTE — Progress Notes (Signed)
[facility] Summary Report / Loop Recorder 

## 2020-12-20 ENCOUNTER — Ambulatory Visit (INDEPENDENT_AMBULATORY_CARE_PROVIDER_SITE_OTHER): Payer: Medicare Other

## 2020-12-20 DIAGNOSIS — I63449 Cerebral infarction due to embolism of unspecified cerebellar artery: Secondary | ICD-10-CM

## 2020-12-23 LAB — CUP PACEART REMOTE DEVICE CHECK
Date Time Interrogation Session: 20221019144359
Implantable Pulse Generator Implant Date: 20210414

## 2020-12-27 ENCOUNTER — Telehealth: Payer: Self-pay | Admitting: Cardiology

## 2020-12-27 NOTE — Telephone Encounter (Signed)
No, ok for him to keep appt 10/26 as his bradycardia is likely due to bigeminy

## 2020-12-27 NOTE — Telephone Encounter (Signed)
Spoke with patient of Dr. Harrell Gave who reports low HR and chest pain  He describes chest pain as dull, lasting about 15 mins, cannot correlate with activity  He said he has gotten chest pain when he has afib with fast HRs, but this time his HR is low  He reports HR varies, typically 30s-50s, sometimes up to 80s but not often in this range.   No recent episodes of afib with rvr that he is aware of  Scheduled for DOD appt 10/26 with Dr. Harriet Masson (he wishes to be worked with Dr. Harrell Gave -- explained no openings until mod-Nov). His wife was scheduled with Vella Raring NP this week for acute issues.   Will send message to device team to pull report on ILR and MD/RN to review

## 2020-12-27 NOTE — Telephone Encounter (Signed)
STAT if HR is under 50 or over 120 (normal HR is 60-100 beats per minute)  What is your heart rate? 35  Do you have a log of your heart rate readings (document readings)? 75-35  Do you have any other symptoms? Chest Pain, feet swelling and numb toes   Has been occurring since Friday 12/24/20    Pt c/o of Chest Pain: STAT if CP now or developed within 24 hours  1. Are you having CP right now? No   2. Are you experiencing any other symptoms (ex. SOB, nausea, vomiting, sweating)? See above telephone note   3. How long have you been experiencing CP? Since Friday 12/24/20  4. Is your CP continuous or coming and going? Coming and going   5. Have you taken Nitroglycerin? No  ?

## 2020-12-27 NOTE — Telephone Encounter (Signed)
Todays Linq transmission reviewed. Presenting ECG appears to be Bigeminy which would correlate with patients reported HR if he is utilizing a pulse oximeter or other HR monitoring device.

## 2020-12-27 NOTE — Telephone Encounter (Signed)
Spoke with patient and relayed message from Dr. Harrell Gave There was an opening with her tmr 12/28/20 -- scheduled patient in this spot per request

## 2020-12-28 ENCOUNTER — Other Ambulatory Visit: Payer: Self-pay

## 2020-12-28 ENCOUNTER — Ambulatory Visit (HOSPITAL_BASED_OUTPATIENT_CLINIC_OR_DEPARTMENT_OTHER): Payer: Medicare Other | Admitting: Cardiology

## 2020-12-28 ENCOUNTER — Encounter (HOSPITAL_BASED_OUTPATIENT_CLINIC_OR_DEPARTMENT_OTHER): Payer: Self-pay | Admitting: Cardiology

## 2020-12-28 VITALS — BP 132/68 | HR 76 | Ht 70.0 in | Wt 242.2 lb

## 2020-12-28 DIAGNOSIS — I517 Cardiomegaly: Secondary | ICD-10-CM

## 2020-12-28 DIAGNOSIS — Z8673 Personal history of transient ischemic attack (TIA), and cerebral infarction without residual deficits: Secondary | ICD-10-CM | POA: Diagnosis not present

## 2020-12-28 DIAGNOSIS — I493 Ventricular premature depolarization: Secondary | ICD-10-CM | POA: Diagnosis not present

## 2020-12-28 DIAGNOSIS — E119 Type 2 diabetes mellitus without complications: Secondary | ICD-10-CM | POA: Diagnosis not present

## 2020-12-28 DIAGNOSIS — I471 Supraventricular tachycardia: Secondary | ICD-10-CM | POA: Diagnosis not present

## 2020-12-28 NOTE — Progress Notes (Signed)
Cardiology Office Note:    Date:  12/29/2020   ID:  Alec Maldonado, DOB 29-Apr-1950, MRN [id number]  PCP:  Ria Bush, MD  Cardiologist:  Buford Dresser, MD  EP: Dr. Caryl Comes  Referring MD: Ria Bush, MD   CC: follow up  History of Present Illness:    Alec Maldonado is a 70 y.o. male with a hx of CVA, prior MV repair, hyperlipidemia, SVT who is seen for follow-up. I initially met him 07/02/2019 as a new consult at the request of Ria Bush, MD for the evaluation and management of left ventricular hypertrophy.  Pertinent PMH -was told he will not regain vision after his retinal occlusion. Silent embolic infarct, had TEE. -Has had 3 episodes of SVT, once stopped on its own, once with adenosine, and once with vagal maneuvers.   Today: Overall, he was feeling pretty good until last Friday, when his heart rate dropped unexpectedly based on his home readings. He denies feeling dizzy or lightheaded. At home, his resting heart rate is usually 65-75 bpm. This morning his heart rate was reading about 55-61 bpm.  He did experience some minor chest pains. Of note, he states this pain is not nearly as severe as the pain he felt during episodes of rapid rhythm.   Also, he has noticed some increased LE edema at times, mostly in his feet. There has been numbness in the toes of his right foot as well. He underwent a right total knee arthroplasty in April.  Sometimes has nasal drainage causing him to cough, which he attributes to seasonal allergies.  He denies any palpitations, or shortness of breath. No headaches, syncope, orthopnea, PND, or exertional symptoms.   Past Medical History:  Diagnosis Date   Allergy    Aortic insufficiency    Mild, echo, January, 2014   BCC (basal cell carcinoma), back 2017   s/p excisional biospy by derm   BPH (benign prostatic hypertrophy)    Central retinal artery occlusion of left eye 06/16/2019   S/p ER eval 06/2019 Saw neurology Leonie Man) -  left eye vision loss due to central retinal artery occlusion in April 2021, MRI scan showing silent cerebral embolic infarcts of cryptogenic etiology   Chest discomfort    nuclear 06/24/09, suggestion prior small inferior MI with moderate peri-infarvt ischemia versus variable diaphragmatic attenuation   Colon polyps    colonoscopy 07/02/06, repeat in 10 years   Coronary artery disease    Dyslipidemia    Dysrhythmia    supraventricular tachycardia, paplitations   Ejection fraction    EF 55%, echo, October, 2010  //   EF 45-50%,  catheterization, 2011   Heart murmur    History 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 TEE 06/2019 IMPRESSION: 1. Left ventricular ejection fraction, by estimation, is 50 to 55%. The left ventricle has low normal function. The left ventricle has no regional wall motion abnormalities. There is severe asymmetric left ventricular hypertrophy 2. Right ventricular systolic function is normal.   Hyperlipidemia    Migraines    rare   Mitral regurgitation    Mitral valve repair 2001   Neurofibroma of foot 04/2017   s/p removal by derm   Palpitations 07/2009    helped with carvedilol    Pleurisy    Postural dizziness    mild, no orthostatic changes by blood pressure check   Pre-diabetes  Pre-syncope    January, 2014   Right ventricular dysfunction    Mild/moderate right ventricular dysfunction by echo, January, 2014, no tricuspid regurgitation, therefore right heart pressure could not be estimated.   Shortness of breath    Status post mitral valve repair    2001,  //  echo, October, 2010, good function of the repaired mitral valve   Stroke [facility]) 2021   lt eye    SVT (supraventricular tachycardia) ([place]) 03/11/2013   Isolated episodes - last 03/2013 after flu treated with adenosine cardioversion in ER.    Thyroid disease      Past Surgical History:  Procedure Laterality Date   aneurysmal dilatation     proximal LAD cath 2001, reoeat catheter 2004, no significant change   aneurysmal dilatiation  07/15/09   no change, normal right heart filling pressures and LVEDP- EF 45-50%   APPENDECTOMY  1970   BUBBLE STUDY  06/18/2019   Procedure: BUBBLE STUDY;  Surgeon: Sueanne Margarita, MD;  Location: [facility] ENDOSCOPY;  Service: Cardiovascular;;   CARDIAC CATHETERIZATION  2010   mild global LV dysfunction EF 45-50%, no significant CAD obstruction, aneurysmal dilatation LAD   COLONOSCOPY  2008   COLONOSCOPY  06/2016   diverticulosis, rpt 10 yrs Ardis Hughs)   CORONARY ANGIOPLASTY  09/1999   severe MR o/w normal limits   CORONARY ANGIOPLASTY  12/19/02   pos. CAD, mild LV dysfunction- med treatment   EYE SURGERY Left    HERNIA REPAIR  01/11/05   left, Dr. Hassell Done   I & D EXTREMITY Right 02/04/2015   Procedure: IRRIGATION AND DEBRIDEMENT RIGHT INDEX FINGER OPEN FRACTURE AND REPAIR DIGITAL NERVE AND ARTERY;  Surgeon: Leanora Cover, MD;  Location: [place];  Service: Orthopedics;  Laterality: Right;   LOOP RECORDER INSERTION N/A 06/18/2019   Procedure: LOOP RECORDER INSERTION;  Surgeon: Deboraha Sprang, MD;  Location: [place] CV LAB;  Service: Cardiovascular;  Laterality: N/A;   MITRAL VALVE REPAIR  01/22/00   good mitral valve function- echo 12/2008- EF 55% / nuclear 06/2009- EF 36%,? reliable? / EF 45-50%, cath rated 02/2010   ORIF ELBOW FRACTURE     left   POLYPECTOMY     TEE WITHOUT CARDIOVERSION N/A 06/18/2019   Procedure: TRANSESOPHAGEAL ECHOCARDIOGRAM (TEE);  Surgeon: Sueanne Margarita, MD;  Location: [facility] ENDOSCOPY;  Service: Cardiovascular;  Laterality: N/A;   THYROID SURGERY     THYROIDECTOMY     left, due to goiter [place] Right 06/29/2020   Procedure: RIGHT TOTAL KNEE ARTHROPLASTY;  Surgeon: Garald Balding, MD;  Location: [facility];  Service: Orthopedics;  Laterality: Right;    Current  Medications: Current Outpatient Medications on File Prior to Visit  Medication Sig   acetaminophen (TYLENOL) 500 MG tablet Take 500 mg by mouth every 6 (six) hours as needed.   aspirin 81 MG tablet Take 1 tablet (81 mg total) by mouth 2 (two) times daily with a meal.   carvedilol (COREG) 6.25 MG tablet Take 1 tablet (6.25 mg total) by mouth 2 (two) times daily with a meal.   ezetimibe (ZETIA) 10 MG tablet TAKE 1 TABLET(10 MG) BY MOUTH DAILY (Patient taking differently: Take 10 mg by mouth daily.)   finasteride (PROSCAR) 5 MG tablet TAKE 1 TABLET(5 MG) BY MOUTH DAILY   Omega-3 Fatty Acids (FISH OIL) 1200 MG CAPS Take 1,200 mg by mouth daily.   simvastatin (ZOCOR) 80 MG tablet TAKE 1 TABLET(80 MG) BY MOUTH DAILY  tamsulosin (FLOMAX) 0.4 MG CAPS capsule Take 2 capsules (0.8 mg total) by mouth at bedtime.   No current facility-administered medications on file prior to visit.     Allergies:   Patient has no known allergies.   Social History   Tobacco Use   Smoking status: Never   Smokeless tobacco: Never  Vaping Use   Vaping Use: Never used  Substance Use Topics   Alcohol use: No   Drug use: No    Family History: family history includes Alzheimer's disease in his mother; Arthritis in his father; Benign prostatic hyperplasia in his father; Heart disease in his brother, father, and mother. There is no history of Hypertension, Diabetes, Depression, Alcohol abuse, Drug abuse, Stroke, Cancer, Colon cancer, Esophageal cancer, Rectal cancer, or Stomach cancer.  ROS:   Please see the history of present illness. (+) Minor chest pain (+) Bilateral LE edema, feet (+) Numbness, toes of right foot (+) Seasonal allergies All other systems are reviewed and negative.     EKGs/Labs/Other Studies Reviewed:    The following studies were reviewed today:  Cardiac MRI 08/11/2019: FINDINGS: The patient is s/p sternotomy. Limited images of the lung fields showed no gross abnormalities.   Normal  left ventricular size with normal wall thickness. LV EF 54%, mild mid inferolateral wall hypokinesis with septal bounce consistent with prior cardiac surgery noted. Mildly dilated right ventricle with mildly decreased systolic function, EF 79%. Mild biatrial enlargement. Lipomatous interatrial septal hypertrophy. Trileaflet aortic valve with mild regurgitation, no significant stenosis. The patient is s/p mitral valve repair, trivial mitral regurgitation noted.   On delayed enhancement imaging, there was no myocardial late gadolinium enhancement (LGE).   Measurements:   LVEDV 171 mL  LVSV 92 mL   LVEF 54%   RVEDV 190 mL  RVSV 70 mL   RVEF 37%   IMPRESSION: 1. Normal LV size and wall thickness, no evidence for hypertrophic cardiomyopathy. EF 54%, mild mid inferolateral hypokinesis with septal bounce consistent with prior surgery.   2. Mildly dilated RV with mildly decreased systolic function, EF 15%.   3.  S/p mitral valve repair with minimal MR noted.   4. No myocardial LGE, so no definitive evidence for prior MI, myocarditis, or infiltrative disease.  Echo TEE 06/18/2019:  1. Left ventricular ejection fraction, by estimation, is 50 to 55%. The  left ventricle has low normal function. The left ventricle has no regional  wall motion abnormalities.   2. Right ventricular systolic function is normal. The right ventricular  size is normal.   3. Left atrial size was severely dilated. No left atrial/left atrial  appendage thrombus was detected.   4. S/P MV repair. The MV is degenerative with thickened and calcified  leaflets. There is some restricted leaflet motion but no mitral stenosis  with mean MVG of 60mHg. There is mild mitral regurgitation.   5. The aortic valve is tricuspid. Aortic valve regurgitation is mild.  Mild aortic valve sclerosis is present, with no evidence of aortic valve  stenosis.   6. The inferior vena cava is normal in size with greater than 50%   respiratory variability, suggesting right atrial pressure of 3 mmHg.   7. The interatrial septum appears to be lipomatous. No atrial level shunt  detected by color flow Doppler. Agitated saline contrast was given  intravenously to evaluate for intracardiac shunting. There is no evidence  of a patent foramen ovale. There is no   evidence of an atrial septal defect.   Conclusion(s)/Recommendation(s):  No source of embolism noted.   Echo 06/16/19 1. Left ventricular ejection fraction, by estimation, is 50 to 55%. The  left ventricle has low normal function. The left ventricle has no regional  wall motion abnormalities. The left ventricular internal cavity size was  mildly dilated. There is severe  asymmetric left ventricular hypertrophy. Left ventricular diastolic  parameters are consistent with Grade I diastolic dysfunction (impaired  relaxation).   2. Right ventricular systolic function is normal. The right ventricular  size is normal. There is normal pulmonary artery systolic pressure.   3. Left atrial size was severely dilated.   4. The mitral valve is degenerative. Trivial mitral valve regurgitation.  No evidence of mitral stenosis.   5. The aortic valve is tricuspid. Aortic valve regurgitation is trivial.  No aortic stenosis is present.   6. Aortic dilatation noted. There is mild dilatation of the ascending  aorta.   7. The inferior vena cava is normal in size with greater than 50%  respiratory variability, suggesting right atrial pressure of 3 mmHg.   EKG:  EKG is personally reviewed.   12/29/2019: sinus rhythm with frequent ectopy, both PACs and PVCs. RBBB 07/02/2019: sinus rhythm, RBBB, 2 PVCs  Recent Labs: 07/19/2020: ALT 17; Hemoglobin 11.4; Platelets 427.0; TSH 2.03 12/28/2020: BUN 14; Creatinine, Ser 1.07; Magnesium 2.1; Potassium 4.2; Sodium 143  Recent Lipid Panel    Component Value Date/Time   CHOL 132 05/13/2020 0755   TRIG 95.0 05/13/2020 0755   HDL 46.40  05/13/2020 0755   CHOLHDL 3 05/13/2020 0755   VLDL 19.0 05/13/2020 0755   LDLCALC 66 05/13/2020 0755    Physical Exam:    VS:  BP 132/68   Pulse 76   Ht 5' 10"  (1.778 m)   Wt 242 lb 3.2 oz (109.9 kg)   SpO2 96%   BMI 34.75 kg/m     Wt Readings from Last 3 Encounters:  12/28/20 242 lb 3.2 oz (109.9 kg)  11/16/20 242 lb (109.8 kg)  07/13/20 242 lb (109.8 kg)    GEN: Well nourished, well developed in no acute distress HEENT: Normal, moist mucous membranes NECK: No JVD CARDIAC: regular rhythm, normal S1 and S2, no rubs or gallops. 1/6 systolic murmur. VASCULAR: Radial and DP pulses 2+ bilaterally. No carotid bruits RESPIRATORY:  Clear to auscultation without rales, wheezing or rhonchi  ABDOMEN: Soft, non-tender, non-distended MUSCULOSKELETAL:  Ambulates independently SKIN: Warm and dry, no edema NEUROLOGIC:  Alert and oriented x 3. No focal neuro deficits noted. PSYCHIATRIC:  Normal affect    ASSESSMENT:    1. PVC (premature ventricular contraction)   2. LVH (left ventricular hypertrophy)   3. Paroxysmal SVT (supraventricular tachycardia) ([place])   4. History of CVA (cerebrovascular accident)   5. Type 2 diabetes mellitus without complication, without long-term current use of insulin (HCC)     PLAN:    Frequent atrial and ventricular ectopy, including bigeminy -he is asymptomatic, but based on ECG today and strip from loop recorder, this is likely why his monitor reads heart rates in the 30s-40s -he has an upcoming interrogation. Based on that, I will reach out to Dr. Caryl Comes to see if he has suggestions for further management  Left ventricular hypertrophy: -cMRI without scar or evidence of infiltrative disease -no syncope  Paroxysmal SVT Atrial fibrillation detected 07/30/20 loop interrogation, very brief Cryptogenic stroke: -followed by Dr. Caryl Comes. Per comments, very brief afib, longest event 14 minutes. Given brevity, anticoagulation not started -LINQ loop recorder in  place  -  on carvedilol 6.25 mg BID -on aspirin 81 mg, clopidogrel 75 mg daily and simvastatin 80 mg daily for secondary prevention  Type II diabetes Hyperlipidemia -continue GLP1RA given CVA history LDL goal <70, continue simvastatin and ezetimine  Cardiac risk counseling and prevention recommendations: -recommend heart healthy/Mediterranean diet, with whole grains, fruits, vegetable, fish, lean meats, nuts, and olive oil. Limit salt. -recommend moderate walking, 3-5 times/week for 30-50 minutes each session. Aim for at least 150 minutes.week. Goal should be pace of 3 miles/hours, or walking 1.5 miles in 30 minutes -recommend avoidance of tobacco products. Avoid excess alcohol.  Plan for follow up: 3 mos or sooner as needed.  Buford Dresser, MD, PhD [place]  [facility]    Medication Adjustments/Labs and Tests Ordered: Current medicines are reviewed at length with the patient today.  Concerns regarding medicines are outlined above.   Orders Placed This Encounter  Procedures   Magnesium   Basic metabolic panel   EKG 40-CXKG    No orders of the defined types were placed in this encounter.   Patient Instructions  Medication Instructions:  Your Physician recommend you continue on your current medication as directed.    *If you need a refill on your cardiac medications before your next appointment, please call your pharmacy*   Lab Work: Please go to [facility] for lab work   If you have labs (blood work) drawn today and your tests are completely normal, you will receive your results only by: [place] (if you have MyChart) OR A paper copy in the mail If you have any lab test that is abnormal or we need to change your treatment, we will call you to review the results.   Testing/Procedures: None ordered   Follow-Up: At [facility], you and your health needs are our priority.  As part of our continuing mission to provide you with exceptional heart  care, we have created designated Provider Care Teams.  These Care Teams include your primary Cardiologist (physician) and Advanced Practice Providers (APPs -  Physician Assistants and Nurse Practitioners) who all work together to provide you with the care you need, when you need it.  We recommend signing up for the patient portal called "MyChart".  Sign up information is provided on this After Visit Summary.  MyChart is used to connect with patients for Virtual Visits (Telemedicine).  Patients are able to view lab/test results, encounter notes, upcoming appointments, etc.  Non-urgent messages can be sent to your provider as well.   To learn more about what you can do with MyChart, go to [email or web address].    Your next appointment:   3 month(s)  The format for your next appointment:   In Person  Provider:   Buford Dresser, MD   Other Instructions None today   I,Mathew Stumpf,acting as a scribe for Buford Dresser, MD.,have documented all relevant documentation on the behalf of Buford Dresser, MD,as directed by  Buford Dresser, MD while in the presence of Buford Dresser, MD.  I, Buford Dresser, MD, have reviewed all documentation for this visit. The documentation on 12/29/20 for the exam, diagnosis, procedures, and orders are all accurate and complete.   Signed, Buford Dresser, MD PhD 12/29/2020 2:51 PM    [facility]

## 2020-12-28 NOTE — Patient Instructions (Signed)
Medication Instructions:  Your Physician recommend you continue on your current medication as directed.    *If you need a refill on your cardiac medications before your next appointment, please call your pharmacy*   Lab Work: Please go to [facility] for lab work   If you have labs (blood work) drawn today and your tests are completely normal, you will receive your results only by: [place] (if you have MyChart) OR A paper copy in the mail If you have any lab test that is abnormal or we need to change your treatment, we will call you to review the results.   Testing/Procedures: None ordered   Follow-Up: At [facility], you and your health needs are our priority.  As part of our continuing mission to provide you with exceptional heart care, we have created designated Provider Care Teams.  These Care Teams include your primary Cardiologist (physician) and Advanced Practice Providers (APPs -  Physician Assistants and Nurse Practitioners) who all work together to provide you with the care you need, when you need it.  We recommend signing up for the patient portal called "MyChart".  Sign up information is provided on this After Visit Summary.  MyChart is used to connect with patients for Virtual Visits (Telemedicine).  Patients are able to view lab/test results, encounter notes, upcoming appointments, etc.  Non-urgent messages can be sent to your provider as well.   To learn more about what you can do with MyChart, go to [email or web address].    Your next appointment:   3 month(s)  The format for your next appointment:   In Person  Provider:   Buford Dresser, MD   Other Instructions None today

## 2020-12-29 ENCOUNTER — Ambulatory Visit: Payer: Medicare Other | Admitting: Cardiology

## 2020-12-29 DIAGNOSIS — E119 Type 2 diabetes mellitus without complications: Secondary | ICD-10-CM | POA: Insufficient documentation

## 2020-12-29 DIAGNOSIS — I493 Ventricular premature depolarization: Secondary | ICD-10-CM | POA: Insufficient documentation

## 2020-12-29 LAB — BASIC METABOLIC PANEL
BUN/Creatinine Ratio: 13 (ref 10–24)
BUN: 14 mg/dL (ref 8–27)
CO2: 23 mmol/L (ref 20–29)
Calcium: 9.6 mg/dL (ref 8.6–10.2)
Chloride: 106 mmol/L (ref 96–106)
Creatinine, Ser: 1.07 mg/dL (ref 0.76–1.27)
Glucose: 73 mg/dL (ref 70–99)
Potassium: 4.2 mmol/L (ref 3.5–5.2)
Sodium: 143 mmol/L (ref 134–144)
eGFR: 74 mL/min/{1.73_m2} (ref >59)

## 2020-12-29 LAB — MAGNESIUM: Magnesium: 2.1 mg/dL (ref 1.6–2.3)

## 2020-12-29 NOTE — Progress Notes (Signed)
[facility] Summary Report / Loop Recorder 

## 2021-01-24 ENCOUNTER — Ambulatory Visit (INDEPENDENT_AMBULATORY_CARE_PROVIDER_SITE_OTHER): Payer: Medicare Other

## 2021-01-24 DIAGNOSIS — I63449 Cerebral infarction due to embolism of unspecified cerebellar artery: Secondary | ICD-10-CM | POA: Diagnosis not present

## 2021-01-25 LAB — CUP PACEART REMOTE DEVICE CHECK
Date Time Interrogation Session: 20221122102955
Implantable Pulse Generator Implant Date: 20210414

## 2021-02-03 NOTE — Progress Notes (Signed)
[facility] Summary Report / Loop Recorder 

## 2021-02-15 ENCOUNTER — Other Ambulatory Visit: Payer: Self-pay

## 2021-02-15 ENCOUNTER — Ambulatory Visit: Payer: Medicare Other | Admitting: Orthopaedic Surgery

## 2021-02-15 ENCOUNTER — Encounter: Payer: Self-pay | Admitting: Orthopaedic Surgery

## 2021-02-15 DIAGNOSIS — M170 Bilateral primary osteoarthritis of knee: Secondary | ICD-10-CM

## 2021-02-15 DIAGNOSIS — Z96651 Presence of right artificial knee joint: Secondary | ICD-10-CM | POA: Diagnosis not present

## 2021-02-15 NOTE — Progress Notes (Signed)
Office Visit Note   Patient: Alec Maldonado           Date of Birth: November 26, 1950           MRN: [id number] Visit Date: 02/15/2021              Requested by: Ria Bush, MD [address] [place],  [place] [address] PCP: Ria Bush, MD   Assessment & Plan: Visit Diagnoses:  1. Bilateral primary osteoarthritis of knee   2. Status post total right knee replacement     Plan: 7-1/2 months status post primary right total knee replacement and doing well.  No fever or chills.  Has regained more flexion is now from 0 to 105 degrees.  There is no instability.  Very minimal effusion.  He has a number of medical issues and notes that he still having some trouble with his left knee but not to the point where he wants a knee replacement at this limits his ability to "exercise".  We have discussed exercise to help strengthen both quads.  Overall I think he is doing well he does not use any ambulatory aid not taking anything for the right knee .plan to see him back as needed.  Follow-Up Instructions: Return if symptoms worsen or fail to improve.   Orders:  No orders of the defined types were placed in this encounter.  No orders of the defined types were placed in this encounter.     Procedures: No procedures performed   Clinical Data: No additional findings.   Subjective: Chief Complaint  Patient presents with   Right Knee - Follow-up    Right total knee arthroplasty 06/29/2020  Patient presents today for follow up on his right knee. He had a right total knee arthroplasty on 06/29/2020. He is now 7 months out from surgery.  No related problems.  He is having some issues with his left knee but not to the point where he would want to consider knee replacement  HPI  Review of Systems   Objective: Vital Signs: There were no vitals taken for this visit.  Physical Exam Constitutional:      Appearance: He is well-developed.  Pulmonary:     Effort: Pulmonary effort is  normal.  Skin:    General: Skin is warm and dry.  Neurological:     Mental Status: He is alert and oriented to person, place, and time.  Psychiatric:        Behavior: Behavior normal.    Ortho Exam right knee with very minimal effusion.  It was minimally warm but not much more than the left knee.  No opening with varus or valgus stress.  Negative anterior drawer sign.  Full quick extension and flex to 105 degrees with a goniometer.  No popliteal pain or mass.  No calf pain.  Walks without a limp.  Specialty Comments:  No specialty comments available.  Imaging: No results found.   PMFS History: Patient Active Problem List   Diagnosis Date Noted   PVC (premature ventricular contraction) 12/29/2020   Type 2 diabetes mellitus without complication, without long-term current use of insulin ([place]) 12/29/2020   Status post total right knee replacement 07/13/2020   Pre-op evaluation 05/18/2020   Bilateral primary osteoarthritis of knee 02/11/2020   Posterior vitreous detachment, both eyes 01/13/2020   Acute right-sided low back pain 10/19/2019   Bowel habit changes 10/19/2019   Perianal rash 10/19/2019   LVH (left ventricular hypertrophy) 08/10/2019  Central retinal artery occlusion of left eye 06/16/2019   History 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 06/16/2019   Obesity, Class I, BMI 30-34.9 06/16/2019   Abnormal prostate by palpation 05/13/2019   Primary osteoarthritis of left knee 05/06/2019   Sciatica, right side 05/06/2019   Medicare annual wellness visit, subsequent 02/23/2015   Advanced care planning/counseling discussion 02/23/2015   Health maintenance examination 08/28/2013   Paroxysmal SVT (supraventricular tachycardia) ([place]) 03/11/2013   Dysplastic nevus of trunk 08/20/2012   Aortic insufficiency    Right ventricular dysfunction    RBBB    Status post mitral valve repair    Chest discomfort    Mitral regurgitation     Migraines    Dyspnea 12/14/2008   Dyslipidemia 12/13/2008   Benign prostatic hyperplasia with lower urinary tract symptoms 05/10/2007   Past Medical History:  Diagnosis Date   Allergy    Aortic insufficiency    Mild, echo, January, 2014   BCC (basal cell carcinoma), back 2017   s/p excisional biospy by derm   BPH (benign prostatic hypertrophy)    Central retinal artery occlusion of left eye 06/16/2019   S/p ER eval 06/2019 Saw neurology Leonie Man) - left eye vision loss due to central retinal artery occlusion in April 2021, MRI scan showing silent cerebral embolic infarcts of cryptogenic etiology   Chest discomfort    nuclear 06/24/09, suggestion prior small inferior MI with moderate peri-infarvt ischemia versus variable diaphragmatic attenuation   Colon polyps    colonoscopy 07/02/06, repeat in 10 years   Coronary artery disease    Dyslipidemia    Dysrhythmia    supraventricular tachycardia, paplitations   Ejection fraction    EF 55%, echo, October, 2010  //   EF 45-50%,  catheterization, 2011   Heart murmur    History 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 TEE 06/2019 IMPRESSION: 1. Left ventricular ejection fraction, by estimation, is 50 to 55%. The left ventricle has low normal function. The left ventricle has no regional wall motion abnormalities. There is severe asymmetric left ventricular hypertrophy 2. Right ventricular systolic function is normal.   Hyperlipidemia    Migraines    rare   Mitral regurgitation    Mitral valve repair 2001   Neurofibroma of foot 04/2017   s/p removal by derm   Palpitations 07/2009    helped with carvedilol    Pleurisy    Postural dizziness    mild, no orthostatic changes by blood pressure check   Pre-diabetes    Pre-syncope    January, 2014   Right ventricular dysfunction    Mild/moderate right  ventricular dysfunction by echo, January, 2014, no tricuspid regurgitation, therefore right heart pressure could not be estimated.   Shortness of breath    Status post mitral valve repair    2001,  //  echo, October, 2010, good function of the repaired mitral valve   Stroke [facility]) 2021   lt eye    SVT (supraventricular tachycardia) ([place]) 03/11/2013   Isolated episodes - last 03/2013 after flu treated with adenosine cardioversion in ER.    Thyroid disease     Family History  Problem Relation Age of Onset   Alzheimer's disease Mother    Heart disease Mother        CHF   Arthritis Father    Heart disease Father  leaking mitral valve   Benign prostatic hyperplasia Father    Heart disease Brother        murmur   Hypertension Neg Hx    Diabetes Neg Hx    Depression Neg Hx    Alcohol abuse Neg Hx    Drug abuse Neg Hx    Stroke Neg Hx    Cancer Neg Hx    Colon cancer Neg Hx    Esophageal cancer Neg Hx    Rectal cancer Neg Hx    Stomach cancer Neg Hx     Past Surgical History:  Procedure Laterality Date   aneurysmal dilatation     proximal LAD cath 2001, reoeat catheter 2004, no significant change   aneurysmal dilatiation  07/15/09   no change, normal right heart filling pressures and LVEDP- EF 45-50%   APPENDECTOMY  1970   BUBBLE STUDY  06/18/2019   Procedure: BUBBLE STUDY;  Surgeon: Sueanne Margarita, MD;  Location: [facility] ENDOSCOPY;  Service: Cardiovascular;;   CARDIAC CATHETERIZATION  2010   mild global LV dysfunction EF 45-50%, no significant CAD obstruction, aneurysmal dilatation LAD   COLONOSCOPY  2008   COLONOSCOPY  06/2016   diverticulosis, rpt 10 yrs Ardis Hughs)   CORONARY ANGIOPLASTY  09/1999   severe MR o/w normal limits   CORONARY ANGIOPLASTY  12/19/02   pos. CAD, mild LV dysfunction- med treatment   EYE SURGERY Left    HERNIA REPAIR  01/11/05   left, Dr. Hassell Done   I & D EXTREMITY Right 02/04/2015   Procedure: IRRIGATION AND DEBRIDEMENT RIGHT INDEX FINGER OPEN FRACTURE  AND REPAIR DIGITAL NERVE AND ARTERY;  Surgeon: Leanora Cover, MD;  Location: [place];  Service: Orthopedics;  Laterality: Right;   LOOP RECORDER INSERTION N/A 06/18/2019   Procedure: LOOP RECORDER INSERTION;  Surgeon: Deboraha Sprang, MD;  Location: [facility];  Service: Cardiovascular;  Laterality: N/A;   MITRAL VALVE REPAIR  01/22/00   good mitral valve function- echo 12/2008- EF 55% / nuclear 06/2009- EF 36%,? reliable? / EF 45-50%, cath rated 02/2010   ORIF ELBOW FRACTURE     left   POLYPECTOMY     TEE WITHOUT CARDIOVERSION N/A 06/18/2019   Procedure: TRANSESOPHAGEAL ECHOCARDIOGRAM (TEE);  Surgeon: Sueanne Margarita, MD;  Location: [facility] ENDOSCOPY;  Service: Cardiovascular;  Laterality: N/A;   THYROID SURGERY     THYROIDECTOMY     left, due to goiter [place] Right 06/29/2020   Procedure: RIGHT TOTAL KNEE ARTHROPLASTY;  Surgeon: Garald Balding, MD;  Location: [facility];  Service: Orthopedics;  Laterality: Right;   Social History   Occupational History   Occupation: Artist  Tobacco Use   Smoking status: Never   Smokeless tobacco: Never  Vaping Use   Vaping Use: Never used  Substance and Sexual Activity   Alcohol use: No   Drug use: No   Sexual activity: Yes

## 2021-02-24 ENCOUNTER — Telehealth: Payer: Self-pay | Admitting: Cardiology

## 2021-02-24 NOTE — Telephone Encounter (Signed)
Scheduler documented phone note on incorrect patient, please disregard

## 2021-02-24 NOTE — Telephone Encounter (Signed)
Alec Maldonado abnormal ekg

## 2021-03-01 ENCOUNTER — Ambulatory Visit (INDEPENDENT_AMBULATORY_CARE_PROVIDER_SITE_OTHER): Payer: Medicare Other

## 2021-03-01 DIAGNOSIS — I63449 Cerebral infarction due to embolism of unspecified cerebellar artery: Secondary | ICD-10-CM | POA: Diagnosis not present

## 2021-03-01 LAB — CUP PACEART REMOTE DEVICE CHECK
Date Time Interrogation Session: 20221227044538
Implantable Pulse Generator Implant Date: 20210414

## 2021-03-06 DIAGNOSIS — C4491 Basal cell carcinoma of skin, unspecified: Secondary | ICD-10-CM

## 2021-03-06 HISTORY — DX: Basal cell carcinoma of skin, unspecified: C44.91

## 2021-03-11 NOTE — Progress Notes (Signed)
[facility] Summary Report / Loop Recorder 

## 2021-03-30 ENCOUNTER — Ambulatory Visit (HOSPITAL_BASED_OUTPATIENT_CLINIC_OR_DEPARTMENT_OTHER): Payer: Medicare Other | Admitting: Cardiology

## 2021-04-04 ENCOUNTER — Ambulatory Visit (INDEPENDENT_AMBULATORY_CARE_PROVIDER_SITE_OTHER): Payer: Medicare Other

## 2021-04-04 DIAGNOSIS — I63449 Cerebral infarction due to embolism of unspecified cerebellar artery: Secondary | ICD-10-CM

## 2021-04-05 LAB — CUP PACEART REMOTE DEVICE CHECK
Date Time Interrogation Session: 20230131075957
Implantable Pulse Generator Implant Date: 20210414

## 2021-04-12 NOTE — Progress Notes (Signed)
[facility] Summary Report / Loop Recorder 

## 2021-04-18 ENCOUNTER — Ambulatory Visit (HOSPITAL_BASED_OUTPATIENT_CLINIC_OR_DEPARTMENT_OTHER): Payer: Medicare Other | Admitting: Cardiology

## 2021-05-02 ENCOUNTER — Telehealth: Payer: Self-pay | Admitting: Family Medicine

## 2021-05-02 MED ORDER — TAMSULOSIN HCL 0.4 MG PO CAPS: 0.8000 mg | ORAL_CAPSULE | Freq: Every day | ORAL | 0 refills | Status: DC

## 2021-05-02 NOTE — Telephone Encounter (Signed)
Refill sent to pharmacy electronically.

## 2021-05-02 NOTE — Telephone Encounter (Signed)
Pt needs a refill on tamsulosin (FLOMAX) 0.4 MG CAPS capsule sent to [facility]

## 2021-05-02 NOTE — Addendum Note (Signed)
Addended by: Emelia Salisbury C on: 05/02/2021 11:14 AM   Modules accepted: Orders

## 2021-05-02 NOTE — Telephone Encounter (Signed)
Patient notified by telephone that script has been sent in. Patient was reminded to keep his upcoming appointment.

## 2021-05-09 ENCOUNTER — Ambulatory Visit (INDEPENDENT_AMBULATORY_CARE_PROVIDER_SITE_OTHER): Payer: Medicare Other

## 2021-05-09 DIAGNOSIS — I63449 Cerebral infarction due to embolism of unspecified cerebellar artery: Secondary | ICD-10-CM

## 2021-05-10 LAB — CUP PACEART REMOTE DEVICE CHECK
Date Time Interrogation Session: 20230307160405
Implantable Pulse Generator Implant Date: 20210414

## 2021-05-20 NOTE — Progress Notes (Signed)
[facility] Summary Report / Loop Recorder 

## 2021-06-04 ENCOUNTER — Other Ambulatory Visit: Payer: Self-pay | Admitting: Family Medicine

## 2021-06-04 DIAGNOSIS — E119 Type 2 diabetes mellitus without complications: Secondary | ICD-10-CM

## 2021-06-04 DIAGNOSIS — E785 Hyperlipidemia, unspecified: Secondary | ICD-10-CM

## 2021-06-04 DIAGNOSIS — N3943 Post-void dribbling: Secondary | ICD-10-CM

## 2021-06-04 DIAGNOSIS — D649 Anemia, unspecified: Secondary | ICD-10-CM

## 2021-06-04 DIAGNOSIS — N401 Enlarged prostate with lower urinary tract symptoms: Secondary | ICD-10-CM

## 2021-06-06 ENCOUNTER — Other Ambulatory Visit (INDEPENDENT_AMBULATORY_CARE_PROVIDER_SITE_OTHER): Payer: Medicare Other

## 2021-06-06 ENCOUNTER — Ambulatory Visit (INDEPENDENT_AMBULATORY_CARE_PROVIDER_SITE_OTHER): Payer: Medicare Other

## 2021-06-06 VITALS — Ht 70.0 in | Wt 242.0 lb

## 2021-06-06 DIAGNOSIS — N401 Enlarged prostate with lower urinary tract symptoms: Secondary | ICD-10-CM | POA: Diagnosis not present

## 2021-06-06 DIAGNOSIS — E785 Hyperlipidemia, unspecified: Secondary | ICD-10-CM | POA: Diagnosis not present

## 2021-06-06 DIAGNOSIS — N3943 Post-void dribbling: Secondary | ICD-10-CM

## 2021-06-06 DIAGNOSIS — E119 Type 2 diabetes mellitus without complications: Secondary | ICD-10-CM

## 2021-06-06 DIAGNOSIS — D649 Anemia, unspecified: Secondary | ICD-10-CM | POA: Diagnosis not present

## 2021-06-06 DIAGNOSIS — Z0000 Encounter for general adult medical examination without abnormal findings: Secondary | ICD-10-CM

## 2021-06-06 LAB — CBC WITH DIFFERENTIAL/PLATELET
Basophils Absolute: 0 10*3/uL (ref 0.0–0.1)
Basophils Relative: 0.6 % (ref 0.0–3.0)
Eosinophils Absolute: 0.1 10*3/uL (ref 0.0–0.7)
Eosinophils Relative: 1.8 % (ref 0.0–5.0)
HCT: 38.2 % — ABNORMAL LOW (ref 39.0–52.0)
Hemoglobin: 12.7 g/dL — ABNORMAL LOW (ref 13.0–17.0)
Lymphocytes Relative: 27.8 % (ref 12.0–46.0)
Lymphs Abs: 1.9 10*3/uL (ref 0.7–4.0)
MCHC: 33.4 g/dL (ref 30.0–36.0)
MCV: 82.3 fl (ref 78.0–100.0)
Monocytes Absolute: 0.7 10*3/uL (ref 0.1–1.0)
Monocytes Relative: 10.5 % (ref 3.0–12.0)
Neutro Abs: 4.1 10*3/uL (ref 1.4–7.7)
Neutrophils Relative %: 59.3 % (ref 43.0–77.0)
Platelets: 247 10*3/uL (ref 150.0–400.0)
RBC: 4.64 Mil/uL (ref 4.22–5.81)
RDW: 15.4 % (ref 11.5–15.5)
WBC: 6.9 10*3/uL (ref 4.0–10.5)

## 2021-06-06 LAB — COMPREHENSIVE METABOLIC PANEL
ALT: 17 U/L (ref 0–53)
AST: 16 U/L (ref 0–37)
Albumin: 4.5 g/dL (ref 3.5–5.2)
Alkaline Phosphatase: 58 U/L (ref 39–117)
BUN: 12 mg/dL (ref 6–23)
CO2: 26 mEq/L (ref 19–32)
Calcium: 9.4 mg/dL (ref 8.4–10.5)
Chloride: 106 mEq/L (ref 96–112)
Creatinine, Ser: 1.15 mg/dL (ref 0.40–1.50)
GFR: 64.03 mL/min (ref >60.00)
Glucose, Bld: 101 mg/dL — ABNORMAL HIGH (ref 70–99)
Potassium: 4 mEq/L (ref 3.5–5.1)
Sodium: 141 mEq/L (ref 135–145)
Total Bilirubin: 0.8 mg/dL (ref 0.2–1.2)
Total Protein: 6.4 g/dL (ref 6.0–8.3)

## 2021-06-06 LAB — MICROALBUMIN / CREATININE URINE RATIO
Creatinine,U: 99.3 mg/dL
Microalb Creat Ratio: 0.7 mg/g (ref 0.0–30.0)
Microalb, Ur: <0.7 mg/dL (ref 0.0–1.9)

## 2021-06-06 LAB — LIPID PANEL
Cholesterol: 115 mg/dL (ref 0–200)
HDL: 33.3 mg/dL — ABNORMAL LOW (ref >39.00)
LDL Cholesterol: 59 mg/dL (ref 0–99)
NonHDL: 81.89
Total CHOL/HDL Ratio: 3
Triglycerides: 114 mg/dL (ref 0.0–149.0)
VLDL: 22.8 mg/dL (ref 0.0–40.0)

## 2021-06-06 LAB — PSA: PSA: 1.53 ng/mL (ref 0.10–4.00)

## 2021-06-06 LAB — HEMOGLOBIN A1C: Hgb A1c MFr Bld: 6 % (ref 4.6–6.5)

## 2021-06-06 NOTE — Patient Instructions (Signed)
Alec Maldonado , ?Thank you for taking time to come for your Medicare Wellness Visit. I appreciate your ongoing commitment to your health goals. Please review the following plan we discussed and let me know if I can assist you in the future.  ? ?Screening recommendations/referrals: ?Colonoscopy: Done 06/28/2016 Repeat in 10 years ? ?Recommended yearly ophthalmology/optometry visit for glaucoma screening and checkup ?Recommended yearly dental visit for hygiene and checkup ? ?Vaccinations: ?Influenza vaccine: Done 02/01/2021 Repeat annually ? ?Pneumococcal vaccine: Done 02/23/2015 and 02/24/2016 ?Tdap vaccine: Done 02/04/2015 Repeat in 10 years ? ?Shingles vaccine: Discussed.   ?Covid-19: Done 12/05/2019, 05/14/2019 and 04/20/2019. ? ?Advanced directives: Please bring a copy of your health care power of attorney and living will to the office to be added to your chart at your convenience. ? ? ?Conditions/risks identified: Aim for 30 minutes of exercise or brisk walking, 6-8 glasses of water, and 5 servings of fruits and vegetables each day. ?KEEP UP THE GOOD WORK!! ? ?Next appointment: Follow up in one year for your annual wellness visit. 2024. ? ?Preventive Care 72 Years and Older, Male ? ?Preventive care refers to lifestyle choices and visits with your health care provider that can promote health and wellness. ?What does preventive care include? ?A yearly physical exam. This is also called an annual well check. ?Dental exams once or twice a year. ?Routine eye exams. Ask your health care provider how often you should have your eyes checked. ?Personal lifestyle choices, including: ?Daily care of your teeth and gums. ?Regular physical activity. ?Eating a healthy diet. ?Avoiding tobacco and drug use. ?Limiting alcohol use. ?Practicing safe sex. ?Taking low doses of aspirin every day. ?Taking vitamin and mineral supplements as recommended by your health care provider. ?What happens during an annual well check? ?The services and  screenings done by your health care provider during your annual well check will depend on your age, overall health, lifestyle risk factors, and family history of disease. ?Counseling  ?Your health care provider may ask you questions about your: ?Alcohol use. ?Tobacco use. ?Drug use. ?Emotional well-being. ?Home and relationship well-being. ?Sexual activity. ?Eating habits. ?History of falls. ?Memory and ability to understand (cognition). ?Work and work Statistician. ?Screening  ?You may have the following tests or measurements: ?Height, weight, and BMI. ?Blood pressure. ?Lipid and cholesterol levels. These may be checked every 5 years, or more frequently if you are over 32 years old. ?Skin check. ?Lung cancer screening. You may have this screening every year starting at age 22 if you have a 30-pack-year history of smoking and currently smoke or have quit within the past 15 years. ?Fecal occult blood test (FOBT) of the stool. You may have this test every year starting at age 51. ?Flexible sigmoidoscopy or colonoscopy. You may have a sigmoidoscopy every 5 years or a colonoscopy every 10 years starting at age 38. ?Prostate cancer screening. Recommendations will vary depending on your family history and other risks. ?Hepatitis C blood test. ?Hepatitis B blood test. ?Sexually transmitted disease (STD) testing. ?Diabetes screening. This is done by checking your blood sugar (glucose) after you have not eaten for a while (fasting). You may have this done every 1-3 years. ?Abdominal aortic aneurysm (AAA) screening. You may need this if you are a current or former smoker. ?Osteoporosis. You may be screened starting at age 51 if you are at high risk. ?Talk with your health care provider about your test results, treatment options, and if necessary, the need for more tests. ?Vaccines  ?Your health  care provider may recommend certain vaccines, such as: ?Influenza vaccine. This is recommended every year. ?Tetanus, diphtheria, and  acellular pertussis (Tdap, Td) vaccine. You may need a Td booster every 10 years. ?Zoster vaccine. You may need this after age 77. ?Pneumococcal 13-valent conjugate (PCV13) vaccine. One dose is recommended after age 5. ?Pneumococcal polysaccharide (PPSV23) vaccine. One dose is recommended after age 13. ?Talk to your health care provider about which screenings and vaccines you need and how often you need them. ?This information is not intended to replace advice given to you by your health care provider. Make sure you discuss any questions you have with your health care provider. ?Document Released: 03/19/2015 Document Revised: 11/10/2015 Document Reviewed: 12/22/2014 ?Elsevier Interactive Patient Education ? 2017 [place]. ? ?Fall Prevention in the Home ?Falls can cause injuries. They can happen to people of all ages. There are many things you can do to make your home safe and to help prevent falls. ?What can I do on the outside of my home? ?Regularly fix the edges of walkways and driveways and fix any cracks. ?Remove anything that might make you trip as you walk through a door, such as a raised step or threshold. ?Trim any bushes or trees on the path to your home. ?Use bright outdoor lighting. ?Clear any walking paths of anything that might make someone trip, such as rocks or tools. ?Regularly check to see if handrails are loose or broken. Make sure that both sides of any steps have handrails. ?Any raised decks and porches should have guardrails on the edges. ?Have any leaves, snow, or ice cleared regularly. ?Use sand or salt on walking paths during winter. ?Clean up any spills in your garage right away. This includes oil or grease spills. ?What can I do in the bathroom? ?Use night lights. ?Install grab bars by the toilet and in the tub and shower. Do not use towel bars as grab bars. ?Use non-skid mats or decals in the tub or shower. ?If you need to sit down in the shower, use a plastic, non-slip stool. ?Keep  the floor dry. Clean up any water that spills on the floor as soon as it happens. ?Remove soap buildup in the tub or shower regularly. ?Attach bath mats securely with double-sided non-slip rug tape. ?Do not have throw rugs and other things on the floor that can make you trip. ?What can I do in the bedroom? ?Use night lights. ?Make sure that you have a light by your bed that is easy to reach. ?Do not use any sheets or blankets that are too big for your bed. They should not hang down onto the floor. ?Have a firm chair that has side arms. You can use this for support while you get dressed. ?Do not have throw rugs and other things on the floor that can make you trip. ?What can I do in the kitchen? ?Clean up any spills right away. ?Avoid walking on wet floors. ?Keep items that you use a lot in easy-to-reach places. ?If you need to reach something above you, use a strong step stool that has a grab bar. ?Keep electrical cords out of the way. ?Do not use floor polish or wax that makes floors slippery. If you must use wax, use non-skid floor wax. ?Do not have throw rugs and other things on the floor that can make you trip. ?What can I do with my stairs? ?Do not leave any items on the stairs. ?Make sure that there are handrails on  both sides of the stairs and use them. Fix handrails that are broken or loose. Make sure that handrails are as long as the stairways. ?Check any carpeting to make sure that it is firmly attached to the stairs. Fix any carpet that is loose or worn. ?Avoid having throw rugs at the top or bottom of the stairs. If you do have throw rugs, attach them to the floor with carpet tape. ?Make sure that you have a light switch at the top of the stairs and the bottom of the stairs. If you do not have them, ask someone to add them for you. ?What else can I do to help prevent falls? ?Wear shoes that: ?Do not have high heels. ?Have rubber bottoms. ?Are comfortable and fit you well. ?Are closed at the toe. Do not  wear sandals. ?If you use a stepladder: ?Make sure that it is fully opened. Do not climb a closed stepladder. ?Make sure that both sides of the stepladder are locked into place. ?Ask someone to hold it for yo

## 2021-06-06 NOTE — Progress Notes (Signed)
? ?Subjective:  ? Alec Maldonado is a 71 y.o. male who presents for Medicare Annual/Subsequent preventive examination. ?Virtual Visit via Telephone Note ? ?I connected with  Alec Maldonado on 06/06/21 at  2:00 PM EDT by telephone and verified that I am speaking with the correct person using two identifiers. ? ?Location: ?Patient:HOME ?Provider: [facility] ?Persons participating in the virtual visit: patient/Nurse Health Advisor ?  ?I discussed the limitations, risks, security and privacy concerns of performing an evaluation and management service by telephone and the availability of in person appointments. The patient expressed understanding and agreed to proceed. ? ?Interactive audio and video telecommunications were attempted between this nurse and patient, however failed, due to patient having technical difficulties OR patient did not have access to video capability.  We continued and completed visit with audio only. ? ?Some vital signs may be absent or patient reported.  ? ?Chriss Driver, LPN ? ?Review of Systems    ? ?Cardiac Risk Factors include: advanced age (>20mn, >>86women);dyslipidemia;male gender;sedentary lifestyle;obesity (BMI >30kg/m2) ? ?   ?Objective:  ?  ?Today's Vitals  ? 06/06/21 1358  ?Weight: 242 lb (109.8 kg)  ?Height: '5\' 10"'$  (1.778 m)  ? ?Body mass index is 34.72 kg/m?. ? ? ?  06/06/2021  ?  2:05 PM 06/25/2020  ?  1:52 PM 05/14/2020  ?  1:08 PM 06/16/2019  ? 12:15 PM 05/10/2018  ?  2:32 PM 06/20/2017  ? 12:31 PM 06/14/2016  ?  8:58 AM  ?Advanced Directives  ?Does Patient Have a Medical Advance Directive? Yes Yes Yes No Yes Yes Yes  ?Type of AParamedicof AGranjenoLiving will HSharpsburgLiving will HCannonLiving will  HCoaltonLiving will HReinbeckLiving will HPoint of RocksLiving will  ?Copy of HCallaoin Chart? Yes - validated most recent copy scanned in chart  (See row information)  Yes - validated most recent copy scanned in chart (See row information)  No - copy requested No - copy requested   ?Would patient like information on creating a medical advance directive?    No - Patient declined     ? ? ?Current Medications (verified) ?Outpatient Encounter Medications as of 06/06/2021  ?Medication Sig  ? acetaminophen (TYLENOL) 500 MG tablet Take 500 mg by mouth every 6 (six) hours as needed.  ? aspirin 81 MG tablet Take 1 tablet (81 mg total) by mouth 2 (two) times daily with a meal.  ? carvedilol (COREG) 6.25 MG tablet Take 1 tablet (6.25 mg total) by mouth 2 (two) times daily with a meal.  ? ezetimibe (ZETIA) 10 MG tablet TAKE 1 TABLET(10 MG) BY MOUTH DAILY (Patient taking differently: Take 10 mg by mouth daily.)  ? finasteride (PROSCAR) 5 MG tablet TAKE 1 TABLET(5 MG) BY MOUTH DAILY  ? FLUZONE HIGH-DOSE QUADRIVALENT 0.7 ML SUSY   ? Omega-3 Fatty Acids (FISH OIL) 1200 MG CAPS Take 1,200 mg by mouth daily.  ? simvastatin (ZOCOR) 80 MG tablet TAKE 1 TABLET(80 MG) BY MOUTH DAILY  ? tamsulosin (FLOMAX) 0.4 MG CAPS capsule Take 2 capsules (0.8 mg total) by mouth at bedtime.  ? ?No facility-administered encounter medications on file as of 06/06/2021.  ? ? ?Allergies (verified) ?Patient has no known allergies.  ? ?History: ?Past Medical History:  ?Diagnosis Date  ? Allergy   ? Aortic insufficiency   ? Mild, echo, January, 2014  ? BCC (basal cell carcinoma), back  2017  ? s/p excisional biospy by derm  ? BPH (benign prostatic hypertrophy)   ? Central retinal artery occlusion of left eye 06/16/2019  ? S/p ER eval 06/2019 Saw neurology Leonie Man) - left eye vision loss due to central retinal artery occlusion in April 2021, MRI scan showing silent cerebral embolic infarcts of cryptogenic etiology  ? Chest discomfort   ? nuclear 06/24/09, suggestion prior small inferior MI with moderate peri-infarvt ischemia versus variable diaphragmatic attenuation  ? Colon polyps   ? colonoscopy 07/02/06,  repeat in 10 years  ? Coronary artery disease   ? Dyslipidemia   ? Dysrhythmia   ? supraventricular tachycardia, paplitations  ? Ejection fraction   ? EF 55%, echo, October, 2010  //   EF 45-50%,  catheterization, 2011  ? Heart murmur   ? History 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 06/16/2019  ? Brain MRI 06/2019: Several punctate acute infarcts likely secondary to emboli from central source. TEE 06/2019 IMPRESSION: 1. Left ventricular ejection fraction, by estimation, is 50 to 55%. The left ventricle has low normal function. The left ventricle has no regional wall motion abnormalities. There is severe asymmetric left ventricular hypertrophy 2. Right ventricular systolic function is normal.  ? Hyperlipidemia   ? Migraines   ? rare  ? Mitral regurgitation   ? Mitral valve repair 2001  ? Neurofibroma of foot 04/2017  ? s/p removal by derm  ? Palpitations 07/2009  ?  helped with carvedilol   ? Pleurisy   ? Postural dizziness   ? mild, no orthostatic changes by blood pressure check  ? Pre-diabetes   ? Pre-syncope   ? January, 2014  ? Right ventricular dysfunction   ? Mild/moderate right ventricular dysfunction by echo, January, 2014, no tricuspid regurgitation, therefore right heart pressure could not be estimated.  ? Shortness of breath   ? Status post mitral valve repair   ? 2001,  //  echo, October, 2010, good function of the repaired mitral valve  ? Stroke [facility]) 2021  ? lt eye   ? SVT (supraventricular tachycardia) ([place]) 03/11/2013  ? Isolated episodes - last 03/2013 after flu treated with adenosine cardioversion in ER.   ? Thyroid disease   ? ?Past Surgical History:  ?Procedure Laterality Date  ? aneurysmal dilatation    ? proximal LAD cath 2001, reoeat catheter 2004, no significant change  ? aneurysmal dilatiation  07/15/09  ? no change, normal right heart filling pressures and LVEDP- EF 45-50%  ? APPENDECTOMY  1970  ? BUBBLE STUDY  06/18/2019  ? Procedure: BUBBLE STUDY;   Surgeon: Sueanne Margarita, MD;  Location: [place];  Service: Cardiovascular;;  ? CARDIAC CATHETERIZATION  2010  ? mild global LV dysfunction EF 45-50%, no significant CAD obstruction, aneurysmal dilatation LAD  ? COLONOSCOPY  2008  ? COLONOSCOPY  06/2016  ? diverticulosis, rpt 10 yrs Ardis Hughs)  ? CORONARY ANGIOPLASTY  09/1999  ? severe MR o/w normal limits  ? CORONARY ANGIOPLASTY  12/19/02  ? pos. CAD, mild LV dysfunction- med treatment  ? EYE SURGERY Left   ? HERNIA REPAIR  01/11/05  ? left, Dr. Hassell Done  ? I & D EXTREMITY Right 02/04/2015  ? Procedure: IRRIGATION AND DEBRIDEMENT RIGHT INDEX FINGER OPEN FRACTURE AND REPAIR DIGITAL NERVE AND ARTERY;  Surgeon: Leanora Cover, MD;  Location: [place];  Service: Orthopedics;  Laterality: Right;  ? LOOP RECORDER INSERTION N/A 06/18/2019  ? Procedure: LOOP RECORDER INSERTION;  Surgeon: Caryl Comes,  Revonda Standard, MD;  Location: [facility] [facility];  Service: Cardiovascular;  Laterality: N/A;  ? MITRAL VALVE REPAIR  01/22/00  ? good mitral valve function- echo 12/2008- EF 55% / nuclear 06/2009- EF 36%,? reliable? / EF 45-50%, cath rated 02/2010  ? ORIF ELBOW FRACTURE    ? left  ? POLYPECTOMY    ? TEE WITHOUT CARDIOVERSION N/A 06/18/2019  ? Procedure: TRANSESOPHAGEAL ECHOCARDIOGRAM (TEE);  Surgeon: Sueanne Margarita, MD;  [facility];  Service: Cardiovascular;  Laterality: N/A;  ? THYROID SURGERY    ? THYROIDECTOMY    ? left, due to goiter 1994  ? TOTAL KNEE ARTHROPLASTY Right 06/29/2020  ? Procedure: RIGHT TOTAL KNEE ARTHROPLASTY;  Surgeon: Garald Balding, MD;  Location: [facility];  Service: Orthopedics;  Laterality: Right;  ? ?Family History  ?Problem Relation Age of Onset  ? Alzheimer's disease Mother   ? Heart disease Mother   ?     CHF  ? Arthritis Father   ? Heart disease Father   ?     leaking mitral valve  ? Benign prostatic hyperplasia Father   ? Heart disease Brother   ?     murmur  ? Hypertension Neg Hx   ? Diabetes Neg Hx   ? Depression Neg Hx   ? Alcohol abuse Neg Hx   ?  Drug abuse Neg Hx   ? Stroke Neg Hx   ? Cancer Neg Hx   ? Colon cancer Neg Hx   ? Esophageal cancer Neg Hx   ? Rectal cancer Neg Hx   ? Stomach cancer Neg Hx   ? ?Social History  ? ?Socioeconomic History  ? Marital st

## 2021-06-13 ENCOUNTER — Ambulatory Visit (INDEPENDENT_AMBULATORY_CARE_PROVIDER_SITE_OTHER): Payer: Medicare Other | Admitting: Family Medicine

## 2021-06-13 ENCOUNTER — Ambulatory Visit (INDEPENDENT_AMBULATORY_CARE_PROVIDER_SITE_OTHER): Payer: Medicare Other

## 2021-06-13 ENCOUNTER — Encounter: Payer: Self-pay | Admitting: Family Medicine

## 2021-06-13 VITALS — BP 134/78 | HR 55 | Temp 97.4°F | Ht 70.0 in | Wt 239.0 lb

## 2021-06-13 DIAGNOSIS — Z8673 Personal history of transient ischemic attack (TIA), and cerebral infarction without residual deficits: Secondary | ICD-10-CM | POA: Diagnosis not present

## 2021-06-13 DIAGNOSIS — E669 Obesity, unspecified: Secondary | ICD-10-CM

## 2021-06-13 DIAGNOSIS — I63449 Cerebral infarction due to embolism of unspecified cerebellar artery: Secondary | ICD-10-CM | POA: Diagnosis not present

## 2021-06-13 DIAGNOSIS — Z0000 Encounter for general adult medical examination without abnormal findings: Secondary | ICD-10-CM

## 2021-06-13 DIAGNOSIS — Z96651 Presence of right artificial knee joint: Secondary | ICD-10-CM | POA: Diagnosis not present

## 2021-06-13 DIAGNOSIS — H6192 Disorder of left external ear, unspecified: Secondary | ICD-10-CM | POA: Insufficient documentation

## 2021-06-13 DIAGNOSIS — R7303 Prediabetes: Secondary | ICD-10-CM | POA: Diagnosis not present

## 2021-06-13 DIAGNOSIS — M170 Bilateral primary osteoarthritis of knee: Secondary | ICD-10-CM

## 2021-06-13 DIAGNOSIS — N3943 Post-void dribbling: Secondary | ICD-10-CM

## 2021-06-13 DIAGNOSIS — N401 Enlarged prostate with lower urinary tract symptoms: Secondary | ICD-10-CM

## 2021-06-13 DIAGNOSIS — H3412 Central retinal artery occlusion, left eye: Secondary | ICD-10-CM

## 2021-06-13 DIAGNOSIS — E785 Hyperlipidemia, unspecified: Secondary | ICD-10-CM | POA: Diagnosis not present

## 2021-06-13 DIAGNOSIS — Z9889 Other specified postprocedural states: Secondary | ICD-10-CM | POA: Diagnosis not present

## 2021-06-13 MED ORDER — CARVEDILOL 6.25 MG PO TABS: 6.2500 mg | ORAL_TABLET | Freq: Two times a day (BID) | ORAL | 3 refills | Status: DC

## 2021-06-13 MED ORDER — EZETIMIBE 10 MG PO TABS: ORAL_TABLET | ORAL | 3 refills | Status: DC

## 2021-06-13 MED ORDER — FINASTERIDE 5 MG PO TABS: 5.0000 mg | ORAL_TABLET | Freq: Every day | ORAL | 3 refills | Status: DC

## 2021-06-13 MED ORDER — ASPIRIN 81 MG PO TBEC: 81.0000 mg | DELAYED_RELEASE_TABLET | Freq: Every day | ORAL | 12 refills | Status: DC

## 2021-06-13 MED ORDER — SIMVASTATIN 80 MG PO TABS: 80.0000 mg | ORAL_TABLET | Freq: Every day | ORAL | 3 refills | Status: DC

## 2021-06-13 MED ORDER — TAMSULOSIN HCL 0.4 MG PO CAPS: 0.8000 mg | ORAL_CAPSULE | Freq: Every day | ORAL | 3 refills | Status: DC

## 2021-06-13 NOTE — Patient Instructions (Addendum)
Let me know if you'd like second option for right knee.  ?Good to see you today ?Return in 6 months for lab visit only (prostate).  ?Return as needed or in 1 year for next physical.  ?We will refer you to dermatology to check ear  ? ?Health Maintenance After Age 72 ?After age 53, you are at a higher risk for certain long-term diseases and infections as well as injuries from falls. Falls are a major cause of broken bones and head injuries in people who are older than age 41. Getting regular preventive care can help to keep you healthy and well. Preventive care includes getting regular testing and making lifestyle changes as recommended by your health care provider. Talk with your health care provider about: ?Which screenings and tests you should have. A screening is a test that checks for a disease when you have no symptoms. ?A diet and exercise plan that is right for you. ?What should I know about screenings and tests to prevent falls? ?Screening and testing are the best ways to find a health problem early. Early diagnosis and treatment give you the best chance of managing medical conditions that are common after age 38. Certain conditions and lifestyle choices may make you more likely to have a fall. Your health care provider may recommend: ?Regular vision checks. Poor vision and conditions such as cataracts can make you more likely to have a fall. If you wear glasses, make sure to get your prescription updated if your vision changes. ?Medicine review. Work with your health care provider to regularly review all of the medicines you are taking, including over-the-counter medicines. Ask your health care provider about any side effects that may make you more likely to have a fall. Tell your health care provider if any medicines that you take make you feel dizzy or sleepy. ?Strength and balance checks. Your health care provider may recommend certain tests to check your strength and balance while standing, walking, or  changing positions. ?Foot health exam. Foot pain and numbness, as well as not wearing proper footwear, can make you more likely to have a fall. ?Screenings, including: ?Osteoporosis screening. Osteoporosis is a condition that causes the bones to get weaker and break more easily. ?Blood pressure screening. Blood pressure changes and medicines to control blood pressure can make you feel dizzy. ?Depression screening. You may be more likely to have a fall if you have a fear of falling, feel depressed, or feel unable to do activities that you used to do. ?Alcohol use screening. Using too much alcohol can affect your balance and may make you more likely to have a fall. ?Follow these instructions at home: ?Lifestyle ?Do not drink alcohol if: ?Your health care provider tells you not to drink. ?If you drink alcohol: ?Limit how much you have to: ?0-1 drink a day for women. ?0-2 drinks a day for men. ?Know how much alcohol is in your drink. In the [place] one drink equals one 12 oz bottle of beer (355 mL), one 5 oz glass of wine (148 mL), or one 1? oz glass of hard liquor (44 mL). ?Do not use any products that contain nicotine or tobacco. These products include cigarettes, chewing tobacco, and vaping devices, such as e-cigarettes. If you need help quitting, ask your health care provider. ?Activity ? ?Follow a regular exercise program to stay fit. This will help you maintain your balance. Ask your health care provider what types of exercise are appropriate for you. ?If you need a  cane or walker, use it as recommended by your health care provider. ?Wear supportive shoes that have nonskid soles. ?Safety ? ?Remove any tripping hazards, such as rugs, cords, and clutter. ?Install safety equipment such as grab bars in bathrooms and safety rails on stairs. ?Keep rooms and walkways well-lit. ?General instructions ?Talk with your health care provider about your risks for falling. Tell your health care provider if: ?You fall. Be sure to  tell your health care provider about all falls, even ones that seem minor. ?You feel dizzy, tiredness (fatigue), or off-balance. ?Take over-the-counter and prescription medicines only as told by your health care provider. These include supplements. ?Eat a healthy diet and maintain a healthy weight. A healthy diet includes low-fat dairy products, low-fat (lean) meats, and fiber from whole grains, beans, and lots of fruits and vegetables. ?Stay current with your vaccines. ?Schedule regular health, dental, and eye exams. ?Summary ?Having a healthy lifestyle and getting preventive care can help to protect your health and wellness after age 54. ?Screening and testing are the best way to find a health problem early and help you avoid having a fall. Early diagnosis and treatment give you the best chance for managing medical conditions that are more common for people who are older than age 15. ?Falls are a major cause of broken bones and head injuries in people who are older than age 16. Take precautions to prevent a fall at home. ?Work with your health care provider to learn what changes you can make to improve your health and wellness and to prevent falls. ?This information is not intended to replace advice given to you by your health care provider. Make sure you discuss any questions you have with your health care provider. ?Document Revised: 07/12/2020 Document Reviewed: 07/12/2020 ?Elsevier Patient Education ? [place]. ? ?

## 2021-06-13 NOTE — Assessment & Plan Note (Signed)
Poorly healing wound x 6+ months to left external ear - refer to derm for further evaluation. Last saw Dr Ronnald Ramp. ?

## 2021-06-13 NOTE — Assessment & Plan Note (Signed)
Cryptogenic. Continue aspirin, statin.  ?

## 2021-06-13 NOTE — Assessment & Plan Note (Signed)
S/p R knee replacement with ongoing difficulty ?

## 2021-06-13 NOTE — Assessment & Plan Note (Signed)
Chronic ,stable on high dose simvastatin, tolerated well. Also takes 1 fish oil daily.  ?The ASCVD Risk score (Arnett DK, et al., 2019) failed to calculate for the following reasons: ?  The patient has a prior MI or stroke diagnosis  ?

## 2021-06-13 NOTE — Progress Notes (Addendum)
? ? Patient ID: Alec Maldonado, male    DOB: November 11, 1950, 71 y.o.   MRN: [id number] ? ?This visit was conducted in person. ? ?BP 134/78   Pulse (!) 55   Temp (!) 97.4 ?F (36.3 ?C) (Temporal)   Ht '5\' 10"'$  (1.778 m)   Wt 239 lb (108.4 kg)   SpO2 96%   BMI 34.29 kg/m?   ? ?CC: CPE ?Subjective:  ? ?HPI: ?Alec Maldonado is a 71 y.o. male presenting on 06/13/2021 for Annual Exam [facility] prt 2. ) ? ? ?Saw health advisor last week for medicare wellness visit. Note reviewed.   ? ?No results found.  ?Flowsheet Row Clinical Support from 06/06/2021 in [place] [facility]  ?PHQ-2 Total Score 0  ? ?  ?  ? ?  06/06/2021  ?  2:05 PM 05/14/2020  ?  1:09 PM 07/24/2019  ?  3:43 PM 05/13/2019  ?  9:36 AM 05/10/2018  ?  2:31 PM  ?Fall Risk   ?Falls in the past year? 0 0 0 0 0  ?Number falls in past yr: 0 0     ?Injury with Fall? 0 0     ?Risk for fall due to : No Fall Risks Medication side effect     ?Follow up Falls prevention discussed Falls evaluation completed;Falls prevention discussed     ? ?R knee replacement ([place]) - ongoing difficulty with this - pain with transitions, swelling of knee, numbness to toes and to ball of R foot. No lower back pain or shooting pain down leg.  ? ?S/p L central retinal artery CVA, cryptogenic as stroke workup unrevealing 06/2019, saw neurology (not recently) and cardiology. S/p LINQ loop recorder showing very brief afib 01-Aug-2020 - AC not started.  ? ?Poorly healing lesion to left ear present for 6 months.  ? ?Preventative: ?COLONOSCOPY 06/2016 diverticulosis, rpt 10 yrs Ardis Hughs) ?Prostate screen yearly. Nocturia x2-3, post void dribbling.  ?Lung cancer screening - not eligible ?Flu yearly ?[place] 04/2019, 05/2019, 12/2019 ?Tdap 2015 again 02/2015 ?Pneumovax 2017, [id number] 2016 ?Shingrix - 04/2021, pending 2nd ?Advanced directive discussion - scanned in chart 03/2017. Wife Alec Maldonado would be HCPOA, then son Marjory Lies and daughter Tilda Burrow. Would want HCPOA to make end of life decisions.  ?Seat  belt use discussed ?Sunscreen use discussed. S/p BCC removed from back 2018 - Dr Ronnald Ramp.  ?Non smoker  ?Alcohol - none  ?Dentist q6 mo ?Eye exam yearly - sees retinologist - lost vision in left eye. ?Bowel - no constipation ?Bladder - ongoing for 3 years, random postvoid dribbling, incomplete emptying - this improved after increasing flomax. No stress incontinence symptoms. He does have some urge incontinence symptoms. No key in door phenomenon, some foot on floor urgency. Saw urologist 2021 [facility]).  ?  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   ? ?Relevant past medical, surgical, family and social history reviewed and updated as indicated. Interim medical history since our last visit reviewed. ?Allergies and medications reviewed and updated. ?Outpatient Medications Prior to Visit  ?Medication Sig Dispense Refill  ? acetaminophen (TYLENOL) 500 MG tablet Take 500 mg by mouth every 6 (six) hours as needed.    ? Omega-3 Fatty Acids (FISH OIL) 1200 MG CAPS Take 1,200 mg by mouth daily.    ? aspirin 81 MG tablet Take 1 tablet (  81 mg total) by mouth 2 (two) times daily with a meal. 60 tablet 0  ? carvedilol (COREG) 6.25 MG tablet Take 1 tablet (6.25 mg total) by mouth 2 (two) times daily with a meal. 180 tablet 3  ? ezetimibe (ZETIA) 10 MG tablet TAKE 1 TABLET(10 MG) BY MOUTH DAILY (Patient taking differently: Take 10 mg by mouth daily.) 90 tablet 3  ? finasteride (PROSCAR) 5 MG tablet TAKE 1 TABLET(5 MG) BY MOUTH DAILY 90 tablet 3  ? simvastatin (ZOCOR) 80 MG tablet TAKE 1 TABLET(80 MG) BY MOUTH DAILY 90 tablet 3  ? tamsulosin (FLOMAX) 0.4 MG CAPS capsule Take 2 capsules (0.8 mg total) by mouth at bedtime. 180 capsule 0  ? FLUZONE HIGH-DOSE QUADRIVALENT 0.7 ML SUSY     ? ?No facility-administered medications prior to visit.  ?   ? ?Per HPI unless specifically indicated in ROS section below ?Review of Systems  ?Constitutional:  Negative for activity change, appetite change, chills, fatigue, fever and unexpected weight change.  ?HENT:  Negative for hearing loss.   ?Eyes:  Negative for visual disturbance.  ?Respiratory:  Negative for cough, chest tightness, shortness of breath and wheezing.   ?Cardiovascular:  Negative for chest pain, palpitations and leg swelling.  ?Gastrointestinal:  Negative for abdominal distention, abdominal pain, blood in stool, constipation, diarrhea, nausea and vomiting.  ?Genitourinary:  Negative for difficulty urinating and hematuria.  ?Musculoskeletal:  Positive for arthralgias. Negative for myalgias and neck pain.  ?     R knee swells  ?Skin:  Negative for rash.  ?Neurological:  Negative for dizziness, seizures, syncope and headaches.  ?Hematological:  Negative for adenopathy. Does not bruise/bleed easily.  ?Psychiatric/Behavioral:  Negative for dysphoric mood. The patient is not nervous/anxious.   ? ?Objective:  ?BP 134/78   Pulse (!) 55   Temp (!) 97.4 ?F (36.3 ?C) (Temporal)   Ht '5\' 10"'$  (1.778 m)   Wt 239 lb (108.4 kg)   SpO2 96%   BMI 34.29 kg/m?   ?Wt Readings from Last 3 Encounters:  ?06/13/21 239 lb (108.4 kg)  ?06/06/21 242 lb (109.8 kg)  ?12/28/20 242 lb 3.2 oz (109.9 kg)  ?  ?  ?Physical Exam ?Vitals and nursing note reviewed.  ?Constitutional:   ?   General: He is not in acute distress. ?   Appearance: Normal appearance. He is well-developed. He is not ill-appearing.  ?HENT:  ?   Head: Normocephalic and atraumatic.  ?   Right Ear: Hearing, tympanic membrane, ear canal and external ear normal.  ?   Left Ear: Hearing, tympanic membrane, ear canal and external ear normal.  ?   Ears:  ? ?Eyes:  ?   General: No scleral icterus. ?   Extraocular Movements: Extraocular movements intact.  ?   Conjunctiva/sclera: Conjunctivae normal.  ?   Pupils: Pupils are equal, round, and reactive to light.  ?Neck:  ?    Thyroid: No thyroid mass or thyromegaly.  ?   Vascular: No carotid bruit.  ?Cardiovascular:  ?   Rate and Rhythm: Normal rate and regular rhythm.  ?   Pulses: Normal pulses.     ?     Radial pulses are 2+ on the right side and 2+ on the left side.  ?   Heart sounds: Normal heart sounds. No murmur heard. ?Pulmonary:  ?   Effort: Pulmonary effort is normal. No respiratory distress.  ?   Breath sounds: Normal breath sounds. No wheezing, rhonchi or rales.  ?Abdominal:  ?  General: Bowel sounds are normal. There is no distension.  ?   Palpations: Abdomen is soft. There is no mass.  ?   Tenderness: There is no abdominal tenderness. There is no guarding or rebound.  ?   Hernia: No hernia is present.  ?Musculoskeletal:     ?   General: Tenderness present.  ?   Cervical back: Normal range of motion and neck supple.  ?   Right lower leg: No edema.  ?   Left lower leg: No edema.  ?   Comments:  ?L knee with some crepitus with int/ext rotation ?R knee limited ROM in flexion, FROM extension, discomfort to palpation of lateral joint line, neg drawer test.  ?No pain midline spine ?Neg SLR bilaterally. ?No pain with int/ext rotation at hip.  ?Lymphadenopathy:  ?   Cervical: No cervical adenopathy.  ?Skin: ?   General: Skin is warm and dry.  ?   Findings: No rash.  ?   Comments: Poorly healing wound to left pinna at upper antihelix  ?Neurological:  ?   General: No focal deficit present.  ?   Mental Status: He is alert and oriented to person, place, and time.  ?   Comments:  ?Recall 3/3 ?Calculation 5/5 DLROW  ?Psychiatric:     ?   Mood and Affect: Mood normal.     ?   Behavior: Behavior normal.     ?   Thought Content: Thought content normal.     ?   Judgment: Judgment normal.  ? ?   ?Results for orders placed or performed in visit on 06/06/21  ?CBC with Differential/Platelet  ?Result Value Ref Range  ? WBC 6.9 4.0 - 10.5 K/uL  ? RBC 4.64 4.22 - 5.81 Mil/uL  ? Hemoglobin 12.7 (L) 13.0 - 17.0 g/dL  ? HCT 38.2 (L) 39.0 - 52.0 %  ?  MCV 82.3 78.0 - 100.0 fl  ? MCHC 33.4 30.0 - 36.0 g/dL  ? RDW 15.4 11.5 - 15.5 %  ? Platelets 247.0 150.0 - 400.0 K/uL  ? Neutrophils Relative % 59.3 43.0 - 77.0 %  ? Lymphocytes Relative 27.8 12.0 - 46.0 %  ? [place]

## 2021-06-13 NOTE — Assessment & Plan Note (Signed)
Notes ongoing difficulty with pain/swelling of right knee that persists since R knee replacement 07/2020. Saw ortho with reassuring evaluation.  ?Offered referral for 2nd opinion - he declined ?

## 2021-06-13 NOTE — Assessment & Plan Note (Addendum)
Stable symptoms on finasteride '5mg'$  and flomax 0.'8mg'$  nightly.  ?PSA trending up (PSA of 3 corrected for finasteride) - recommend return in 6 months for labs only.  ?

## 2021-06-13 NOTE — Assessment & Plan Note (Signed)
Preventative protocols reviewed and updated unless pt declined. Discussed healthy diet and lifestyle.  

## 2021-06-13 NOTE — Assessment & Plan Note (Signed)
Encourage healthy diet and lifestyle to affect sustainable weight loss.  

## 2021-06-13 NOTE — Assessment & Plan Note (Signed)
A1c stable at 6%.  ?

## 2021-06-15 LAB — CUP PACEART REMOTE DEVICE CHECK
Date Time Interrogation Session: 20230412072826
Implantable Pulse Generator Implant Date: 20210414

## 2021-06-23 ENCOUNTER — Encounter: Payer: Self-pay | Admitting: *Deleted

## 2021-06-27 ENCOUNTER — Ambulatory Visit (INDEPENDENT_AMBULATORY_CARE_PROVIDER_SITE_OTHER): Payer: Medicare Other

## 2021-06-27 ENCOUNTER — Encounter: Payer: Self-pay | Admitting: Emergency Medicine

## 2021-06-27 ENCOUNTER — Ambulatory Visit
Admission: EM | Admit: 2021-06-27 | Discharge: 2021-06-27 | Disposition: A | Discharge status: Home / Self Care | Payer: Medicare Other | Attending: Family Medicine | Admitting: Family Medicine

## 2021-06-27 ENCOUNTER — Telehealth: Payer: Self-pay | Admitting: Family Medicine

## 2021-06-27 DIAGNOSIS — J069 Acute upper respiratory infection, unspecified: Secondary | ICD-10-CM | POA: Diagnosis not present

## 2021-06-27 DIAGNOSIS — J9811 Atelectasis: Secondary | ICD-10-CM | POA: Diagnosis not present

## 2021-06-27 DIAGNOSIS — R059 Cough, unspecified: Secondary | ICD-10-CM | POA: Diagnosis not present

## 2021-06-27 DIAGNOSIS — R062 Wheezing: Secondary | ICD-10-CM

## 2021-06-27 MED ORDER — PREDNISONE 20 MG PO TABS: 40.0000 mg | ORAL_TABLET | Freq: Every day | ORAL | 0 refills | Status: DC

## 2021-06-27 MED ORDER — HYDROCOD POLI-CHLORPHE POLI ER 10-8 MG/5ML PO SUER: 5.0000 mL | Freq: Every evening | ORAL | 0 refills | Status: DC | PRN

## 2021-06-27 NOTE — Telephone Encounter (Signed)
[facility] Night - Client ?Nonclinical Telephone Record  ?AccessNurse? ?Client [facility] Night - Client ?Client Site [place] ?Provider Ria Bush - MD ?Contact Type Call ?Who Is Calling Patient / Member / Family / Caregiver ?Caller Name Aashrith Eves ?Caller Phone Number [phone] ?Patient Name Alec Maldonado ?Patient DOB 10-13-1950 ?Call Type Message Only Information Provided ?Reason for Call Request to Schedule Office Appointment ?Initial Comment Caller states he has been sick for 3 days and needs to be seen. He has a severe cough. ?Patient request to speak to RN No ?Additional Comment Caller declined triage. Would like to be seen today. Office hours provided and advised to ?follow up with the office. ?Disp. Time Disposition Final User ?06/27/2021 8:02:32 AM General Information Provided Yes Gaspar Skeeters ?Call Closed By: Gaspar Skeeters ?Transaction Date/Time: 06/27/2021 8:00:06 AM (ET ?

## 2021-06-27 NOTE — ED Triage Notes (Addendum)
Pt here with cough, congestion and sore throat x 3 days. States he prefers tussonex for cough medicine.  ?

## 2021-06-27 NOTE — Discharge Instructions (Signed)
Be aware, your cough medication may cause drowsiness. Please do not drive, operate heavy machinery or make important decisions while on this medication, it may cloud your judgement. ? ?

## 2021-06-27 NOTE — Telephone Encounter (Signed)
Pt called asking to be seen today. Stated all his symptoms and would like to be speak with the nurse about getting seen.  ?

## 2021-06-27 NOTE — Telephone Encounter (Signed)
Chart review tab pt has already been seen today at [facility]; sending note to Dr Darnell Level and Lattie Haw CMA. ?

## 2021-06-29 NOTE — ED Provider Notes (Signed)
?[facility] ? ? ?[id number] ?06/27/21 Arrival Time: 8786 ? ?ASSESSMENT & PLAN: ? ?1. Viral URI with cough   ?2. Wheezing   ? ?I have personally viewed the imaging studies ordered this visit. ?CXR without acute resp changes. ? ?No resp distress. ?Discussed typical duration of viral illnesses. ?OTC symptom care as needed. ? ?Discharge Medication List as of 06/27/2021  9:48 AM  ?  ? ?START taking these medications  ? Details  ?chlorpheniramine-HYDROcodone (TUSSIONEX PENNKINETIC ER) 10-8 MG/5ML Take 5 mLs by mouth at bedtime as needed for cough., Starting Mon 06/27/2021, Normal  ?  ?predniSONE (DELTASONE) 20 MG tablet Take 2 tablets (40 mg total) by mouth daily., Starting Mon 06/27/2021, Normal  ?  ?  ? ? ? Follow-up Information   ? ? Ria Bush, MD.   ?Specialty: Family Medicine ?Why: If worsening or failing to improve as anticipated. ?Contact information: ?[address] ?[place] [address] ?[phone] ? ? ?  ?  ? ?  ?  ? ?  ? ? ?Reviewed expectations re: course of current medical issues. Questions answered. ?Outlined signs and symptoms indicating need for more acute intervention. ?Understanding verbalized. ?After Visit Summary given. ? ? ?SUBJECTIVE: ?History from: Patient. ?Alec Maldonado is a 71 y.o. male. Reports: cough, chest congestion, ST; abrupt onset; x 3 days. Cough affecting sleep. No SOB/CP. Feels he may be wheezing. Denies: fever. Normal PO intake without n/v/d. ? ?OBJECTIVE: ? ?Vitals:  ? 06/27/21 0913  ?BP: 133/75  ?Pulse: 82  ?Resp: 18  ?Temp: 98.9 ?F (37.2 ?C)  ?TempSrc: Oral  ?SpO2: 95%  ?  ?General appearance: alert; no distress; fatigued ?Eyes: PERRLA; EOMI; conjunctiva normal ?HENT: [place]; AT; with nasal congestion ?Neck: supple  ?Lungs: speaks full sentences without difficulty; unlabored; deep and persistent cough ?Extremities: no edema ?Skin: warm and dry ?Neurologic: normal gait ?Psychological: alert and cooperative; normal mood and affect ? ? ?Imaging: ?DG Chest 2  View ? ?Result Date: 06/27/2021 ?CLINICAL DATA:  Cough, wheezing x3 days. EXAM: CHEST - 2 VIEW COMPARISON:  Jul 20, 2020 chest radiograph FINDINGS: Enlarged cardiac silhouette. Loop recorder device overlies the anterior left chest. Low lung volumes with dependent atelectasis. No pleural effusion. No pneumothorax. Prior median sternotomy. IMPRESSION: Enlarged cardiac silhouette and low lung volumes with dependent atelectasis. Electronically Signed   By: Dahlia Bailiff M.D.   On: 06/27/2021 09:29  ? ? ?No Known Allergies ? ?Past Medical History:  ?Diagnosis Date  ? Allergy   ? Aortic insufficiency   ? Mild, echo, January, 2014  ? BCC (basal cell carcinoma), back 2017  ? s/p excisional biospy by derm  ? BPH (benign prostatic hypertrophy)   ? Central retinal artery occlusion of left eye 06/16/2019  ? S/p ER eval 06/2019 Saw neurology Leonie Man) - left eye vision loss due to central retinal artery occlusion in April 2021, MRI scan showing silent cerebral embolic infarcts of cryptogenic etiology  ? Chest discomfort   ? nuclear 06/24/09, suggestion prior small inferior MI with moderate peri-infarvt ischemia versus variable diaphragmatic attenuation  ? Colon polyps   ? colonoscopy 07/02/06, repeat in 10 years  ? Coronary artery disease   ? Dyslipidemia   ? Dysplastic nevus of trunk 08/20/2012  ? Dysrhythmia   ? supraventricular tachycardia, paplitations  ? Ejection fraction   ? EF 55%, echo, October, 2010  //   EF 45-50%,  catheterization, 2011  ? Heart murmur   ? History of cerebrovascular accident (CVA) due to embolic occlusion  of cerebellar artery 06/16/2019  ? Brain MRI 06/2019: Several punctate acute infarcts likely secondary to emboli from central source. TEE 06/2019 IMPRESSION: 1. Left ventricular ejection fraction, by estimation, is 50 to 55%. The left ventricle has low normal function. The left ventricle has no regional wall motion abnormalities. There is severe asymmetric left ventricular hypertrophy 2. Right ventricular  systolic function is normal.  ? Hyperlipidemia   ? Migraines   ? rare  ? Mitral regurgitation   ? Mitral valve repair 2001  ? Neurofibroma of foot 04/2017  ? s/p removal by derm  ? Palpitations 2009/08/15  ?  helped with carvedilol   ? Pleurisy   ? Postural dizziness   ? mild, no orthostatic changes by blood pressure check  ? Pre-diabetes   ? Pre-syncope   ? January, 2014  ? Right ventricular dysfunction   ? Mild/moderate right ventricular dysfunction by echo, January, 2014, no tricuspid regurgitation, therefore right heart pressure could not be estimated.  ? Shortness of breath   ? Status post mitral valve repair   ? 2001,  //  echo, October, 2010, good function of the repaired mitral valve  ? Stroke [facility]) 2021  ? lt eye   ? SVT (supraventricular tachycardia) ([place]) 03/11/2013  ? Isolated episodes - last 03/2013 after flu treated with adenosine cardioversion in ER.   ? Thyroid disease   ? ?Social History  ? ?Socioeconomic History  ? Marital status: Married  ?  Spouse name: Not on file  ? Number of children: 2  ? Years of education: Not on file  ? Highest education level: Not on file  ?Occupational History  ? Occupation: Artist  ?Tobacco Use  ? Smoking status: Never  ? Smokeless tobacco: Never  ?Vaping Use  ? Vaping Use: Never used  ?Substance and Sexual Activity  ? Alcohol use: No  ? Drug use: No  ? Sexual activity: Yes  ?Other Topics Concern  ? Not on file  ?Social History Narrative  ? Widowed, wife deceased colon cancer 1996/08/15, remarried 2001, lives with remarried wife  ? Occupation: retired, worked for [employer], therapeutic [employer]  ? Activity: walking 30-2mn 4-5d/wk  ? Diet: good water, fruits/vegetables daily  ? ?Social Determinants of Health  ? ?Financial Resource Strain: Low Risk   ? Difficulty of Paying Living Expenses: Not hard at all  ?Food Insecurity: No Food Insecurity  ? Worried About RCharity fundraiserin the Last Year: Never true  ? Ran Out of Food in the Last Year: Never true  ?Transportation  Needs: No Transportation Needs  ? Lack of Transportation (Medical): No  ? Lack of Transportation (Non-Medical): No  ?Physical Activity: Insufficiently Active  ? Days of Exercise per Week: 3 days  ? Minutes of Exercise per Session: 30 min  ?Stress: Not on file  ?Social Connections: Socially Integrated  ? Frequency of Communication with Friends and Family: More than three times a week  ? Frequency of Social Gatherings with Friends and Family: More than three times a week  ? Attends Religious Services: More than 4 times per year  ? Active Member of Clubs or Organizations: Yes  ? Attends CArchivistMeetings: More than 4 times per year  ? Marital Status: Married  ?Intimate Partner Violence: Not At Risk  ? Fear of Current or Ex-Partner: No  ? Emotionally Abused: No  ? Physically Abused: No  ? Sexually Abused: No  ? ?Family History  ?Problem Relation Age of Onset  ?  Alzheimer's disease Mother   ? Heart disease Mother   ?     CHF  ? Arthritis Father   ? Heart disease Father   ?     leaking mitral valve  ? Benign prostatic hyperplasia Father   ? Heart disease Brother   ?     murmur  ? Hypertension Neg Hx   ? Diabetes Neg Hx   ? Depression Neg Hx   ? Alcohol abuse Neg Hx   ? Drug abuse Neg Hx   ? Stroke Neg Hx   ? Cancer Neg Hx   ? Colon cancer Neg Hx   ? Esophageal cancer Neg Hx   ? Rectal cancer Neg Hx   ? Stomach cancer Neg Hx   ? ?Past Surgical History:  ?Procedure Laterality Date  ? aneurysmal dilatation    ? proximal LAD cath 2001, reoeat catheter 2004, no significant change  ? aneurysmal dilatiation  07/15/09  ? no change, normal right heart filling pressures and LVEDP- EF 45-50%  ? APPENDECTOMY  1970  ? BUBBLE STUDY  06/18/2019  ? Procedure: BUBBLE STUDY;  Surgeon: Sueanne Margarita, MD;  Location: [place];  Service: Cardiovascular;;  ? CARDIAC CATHETERIZATION  2010  ? mild global LV dysfunction EF 45-50%, no significant CAD obstruction, aneurysmal dilatation LAD  ? COLONOSCOPY  2008  ? COLONOSCOPY  06/2016   ? diverticulosis, rpt 10 yrs Ardis Hughs)  ? CORONARY ANGIOPLASTY  09/1999  ? severe MR o/w normal limits  ? CORONARY ANGIOPLASTY  12/19/02  ? pos. CAD, mild LV dysfunction- med treatment  ? EYE SURGERY Left   ? HERNIA

## 2021-06-30 ENCOUNTER — Ambulatory Visit: Payer: Medicare Other | Admitting: Nurse Practitioner

## 2021-06-30 ENCOUNTER — Telehealth: Payer: Self-pay | Admitting: Family Medicine

## 2021-06-30 ENCOUNTER — Encounter: Payer: Self-pay | Admitting: Nurse Practitioner

## 2021-06-30 VITALS — BP 128/56 | HR 60 | Temp 97.5°F | Resp 16 | Ht 70.0 in | Wt 244.4 lb

## 2021-06-30 DIAGNOSIS — J069 Acute upper respiratory infection, unspecified: Secondary | ICD-10-CM | POA: Diagnosis not present

## 2021-06-30 DIAGNOSIS — J029 Acute pharyngitis, unspecified: Secondary | ICD-10-CM | POA: Insufficient documentation

## 2021-06-30 LAB — POCT RAPID STREP A (OFFICE): Rapid Strep A Screen: NEGATIVE

## 2021-06-30 NOTE — Telephone Encounter (Signed)
Agreed I do not feel he needs a larger dose ?

## 2021-06-30 NOTE — Telephone Encounter (Signed)
Patient states he went by the pharmacy as discussed during his OV to check on cough syrup and getting more of this, he was told the only way to get more right now if the RX gets sent in with a bigger dose. Patient states at this time he will keep taking the dose he was prescribed by urgent care and if he needs anything else he will call us back. Nothing further needed at this time ?

## 2021-06-30 NOTE — Telephone Encounter (Signed)
Saw Matt in f/u today. Appreciate him seeing pt.  ?

## 2021-06-30 NOTE — Assessment & Plan Note (Addendum)
Strep test negative in office. ?

## 2021-06-30 NOTE — Telephone Encounter (Signed)
Pt called and needs to speak with the nurse about his cough medication. He was seen today by Karl Ito. Pt did not say what the name of the medication was.  ? ?Callback Number: [phone] ?

## 2021-06-30 NOTE — Patient Instructions (Addendum)
Continue taking the medications that you were prescribed from the urgent care ?Follow up with me on Monday with a phone call and let me know how you are doing ?I will reach out to our referral coordinator about the dermatology referral to Dr. Lavonna Monarch ?

## 2021-06-30 NOTE — Assessment & Plan Note (Signed)
Patient was treated at urgent care for viral upper respiratory infection.  Was given symptomatic treatment including prednisone and Tussionex.  Patient been taking medication with improvement.  I do think to continue on course and defer antibiotic use at this time.  Patient will reach out to me on Monday if is not improving we will consider antibiotic use at that point.  Follow-up if no improvement.  Patient did want a refill on the Tussionex cough syrup.  I did check in and see PDMP and called patient's pharmacy of choice an urgent care doctor had written it for 18 days.  Informed patient that I am unable to renew prescription as it should last him longer than it has.  Patient knowledge understood.  Did offer to send in [facility] but patient politely declined. ?

## 2021-06-30 NOTE — Progress Notes (Signed)
[facility] Summary Report / Loop Recorder 

## 2021-06-30 NOTE — Progress Notes (Signed)
? ?Acute Office Visit ? ?Subjective:  ? ?  ?Patient ID: Alec Maldonado, male    DOB: February 04, 1951, 71 y.o.   MRN: [id number] ? ?Chief Complaint  ?Patient presents with  ? Cough  ?  Sx started on 06/24/21-felt feverish in the beginning, chest congestion, stuffy nose, post nasal drip, sore throat, coughing up clear phlegm, right ear pain. No headache. Covid test negative at home on 06/26/21. Has been taking Prednisone and tussionex from urgent care visit on 06/27/21. Chest xray was done  ? ? ? ?Patient is in today for cough ? ?Symptoms started on 06/24/2021.  ?At home covid test was negative on 06/26/2021 ?Patient was seen on 06/27/2021 at urgent care where they did a chest xray and gave him tussionex and prednisone ? ?Since the urgent care he has been feeling some better. Will finish the prednisone tomorrow. States that the cough medicatoin is helpful. States that he coughs more at night ? ? ? ?Review of Systems  ?Constitutional:  Positive for malaise/fatigue. Negative for chills and fever.  ?HENT:  Positive for congestion, ear pain (right intermittment pan) and sore throat. Negative for ear discharge and sinus pain.   ?Respiratory:  Positive for cough (clear) and shortness of breath (with talking and doe).   ?Gastrointestinal:  Negative for diarrhea, nausea and vomiting.  ?Musculoskeletal:  Negative for joint pain and myalgias.  ?Neurological:  Negative for headaches (at the beginning of th illness).  ? ? ?   ?Objective:  ?  ?BP (!) 128/56   Pulse 60   Temp (!) 97.5 ?F (36.4 ?C)   Resp 16   Ht '5\' 10"'$  (1.778 m)   Wt 244 lb 6 oz (110.8 kg)   SpO2 97%   BMI 35.06 kg/m?  ? ? ?Physical Exam ?Vitals and nursing note reviewed.  ?Constitutional:   ?   Appearance: Normal appearance.  ?HENT:  ?   Nose:  ?   Right Sinus: No maxillary sinus tenderness or frontal sinus tenderness.  ?   Left Sinus: No maxillary sinus tenderness or frontal sinus tenderness.  ?   Mouth/Throat:  ?   Mouth: Mucous membranes are moist.  ?   Pharynx:  Oropharynx is clear.  ?Cardiovascular:  ?   Rate and Rhythm: Normal rate and regular rhythm.  ?   Heart sounds: Normal heart sounds.  ?Pulmonary:  ?   Effort: Pulmonary effort is normal.  ?   Breath sounds: Normal breath sounds.  ?Neurological:  ?   Mental Status: He is alert.  ? ? ?Results for orders placed or performed in visit on 06/30/21  ?Rapid Strep A  ?Result Value Ref Range  ? Rapid Strep A Screen Negative Negative  ? ? ? ?   ?Assessment & Plan:  ? ?Problem List Items Addressed This Visit   ? ?  ? Respiratory  ? Viral upper respiratory tract infection  ?  Patient was treated at urgent care for viral upper respiratory infection.  Was given symptomatic treatment including prednisone and Tussionex.  Patient been taking medication with improvement.  I do think to continue on course and defer antibiotic use at this time.  Patient will reach out to me on Monday if is not improving we will consider antibiotic use at that point.  Follow-up if no improvement.  Patient did want a refill on the Tussionex cough syrup.  I did check in and see PDMP and called patient's pharmacy of choice an urgent care doctor had written it for  18 days.  Informed patient that I am unable to renew prescription as it should last him longer than it has.  Patient knowledge understood.  Did offer to send in [facility] but patient politely declined. ? ?  ?  ?  ? Other  ? Sore throat - Primary  ?  Strep test negative in office. ? ?  ?  ? Relevant Orders  ? Rapid Strep A (Completed)  ? ? ?No orders of the defined types were placed in this encounter. ? ? ?Return if symptoms worsen or fail to improve. ? ?Romilda Garret, NP ? ? ?

## 2021-07-04 ENCOUNTER — Telehealth: Payer: Self-pay | Admitting: Family Medicine

## 2021-07-04 DIAGNOSIS — J069 Acute upper respiratory infection, unspecified: Secondary | ICD-10-CM

## 2021-07-04 MED ORDER — AMOXICILLIN-POT CLAVULANATE 875-125 MG PO TABS: 1.0000 | ORAL_TABLET | Freq: Two times a day (BID) | ORAL | 0 refills | Status: AC

## 2021-07-04 NOTE — Telephone Encounter (Signed)
I called pts wife and she said I could speak with pt; pt said when I called his # he was on phone speaking with Anastasiya CMA and she has the needed information. I apologized for bothering him and sending the note to [facility] CMA. ?

## 2021-07-04 NOTE — Telephone Encounter (Signed)
Pt is calling Cable back from Friday, says he has an infection and would like to talk with the nurse for an update on how he is doing.   ? ?Callback Number: [phone] ?

## 2021-07-04 NOTE — Telephone Encounter (Signed)
Since no improvement will send in Augmentin (antibiotic)  for patient to take ?

## 2021-07-04 NOTE — Telephone Encounter (Signed)
Patient advised.

## 2021-07-04 NOTE — Telephone Encounter (Signed)
Spoke with patient. Patient states he finished Prednisone Friday, felt better Saturday, then yesterday got worse again felt feverish, runny nose, cough, blowing out yellow mucus now, sore throat, headache. Still using the cough syrup-he states 5 ml did not do anything for the cough to calm things down, he tried 7.5 ml and had to go to 10 ml to finally calm it down, uses it at night to try and rest. ?

## 2021-07-11 ENCOUNTER — Telehealth: Payer: Self-pay

## 2021-07-11 NOTE — Telephone Encounter (Signed)
Spoke with patient informed him that his loop recorder had identified a possible AF episodes which could be the reason for his stroke informed patient that Dr. Caryl Comes was out of the office for two weeks offered patient AF clinic apt patient stated he would rather wait for Dr. Caryl Comes to get back informed patient that this could potentially put him at risk for another stroke if he delays is care patient stated that he didn't think AF was the cause of his stroke and he wanted to wait for Dr. Caryl Comes.  ?

## 2021-07-18 ENCOUNTER — Ambulatory Visit (INDEPENDENT_AMBULATORY_CARE_PROVIDER_SITE_OTHER): Payer: Medicare Other

## 2021-07-18 DIAGNOSIS — I63449 Cerebral infarction due to embolism of unspecified cerebellar artery: Secondary | ICD-10-CM

## 2021-07-19 LAB — CUP PACEART REMOTE DEVICE CHECK
Date Time Interrogation Session: 20230511075557
Implantable Pulse Generator Implant Date: 20210414

## 2021-07-25 ENCOUNTER — Encounter: Payer: Self-pay | Admitting: *Deleted

## 2021-07-26 NOTE — Telephone Encounter (Signed)
July 13th is the Dermatology date

## 2021-07-29 NOTE — Telephone Encounter (Signed)
Opened in error

## 2021-08-01 IMAGING — XA Imaging study
1 series · 1 of 1 positions shown · non-contrast
Comparison: none

CLINICAL DATA: Lumbosacral spondylosis without myelopathy.
Significant relief after the previous injection, without side effect
or complication. Partial recurrence of symptoms. The patient wishes
to repeat.

[Series 1: ortho standard · 1 of 1 slices shown]
[im 1/1]
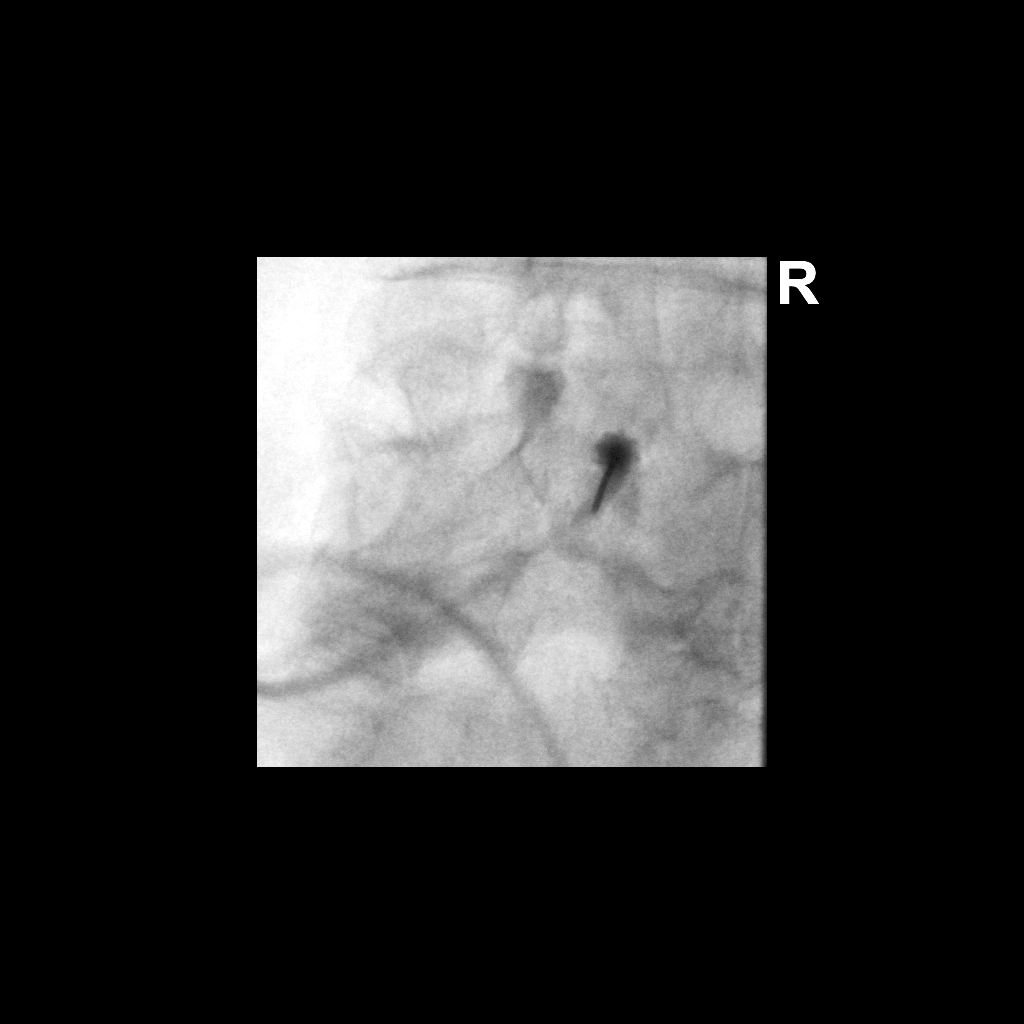

[1 of 1 positions shown; findings below may reference images not displayed]

EXAM:
LUMBAR EPIDURAL INJECTION:

DIAGNOSTIC EPIDURAL INJECTION:

THERAPEUTIC EPIDURAL INJECTION:

PROCEDURE:
The procedure, risks, benefits, and alternatives were explained to
the patient. Questions regarding the procedure were encouraged and
answered. The patient understands and consents to the procedure.

An interlaminar approach was performed on right at L5-S1. The
overlying skin was cleansed and anesthetized. A 20 gauge epidural
needle was advanced using loss-of-resistance technique.

Injection of Isovue-M 200 shows a good epidural pattern with spread
above and below the level of needle placement, primarily on the
right no vascular opacification is seen.

120mg of Depo-Medrol mixed with 2ml lidocaine 1% were instilled. The
procedure was well-tolerated, and the patient was discharged thirty
minutes following the injection in good condition.

FLUOROSCOPY TIME:  17 seconds; 18 uTym0 DAP

COMPLICATIONS:
None immediate
IMPRESSION: Technically successful epidural injection on the right at L5-S1.

## 2021-08-03 ENCOUNTER — Telehealth: Payer: Self-pay

## 2021-08-03 NOTE — Telephone Encounter (Signed)
Patient is calling in again, upset that these have not been sent in.

## 2021-08-03 NOTE — Telephone Encounter (Signed)
MEDICATION: tamsulosin (FLOMAX) 0.4 MG CAPS capsule//simvastatin (ZOCOR) 80 MG tablet// ezetimibe (ZETIA) 10 MG tablet// carvedilol (COREG) 6.25 MG tablet  PHARMACY: [id number] - [place], [place] - [address]  Comments: Patient states [facility] has been waiting on a response from [place], and patient is completely out of all medications.   **Let patient know to contact pharmacy at the end of the day to make sure medication is ready. **  ** Please notify patient to allow 48-72 hours to process**  **Encourage patient to contact the pharmacy for refills or they can request refills through [facility]**

## 2021-08-03 NOTE — Telephone Encounter (Addendum)
Faxed refill request came in from [facility] for Zetia and Carvedilol .

## 2021-08-03 NOTE — Telephone Encounter (Signed)
Called patient and was advised that he had talked with [facility] and they told him that they sent a refill request to our office Friday for his medications. Patient was advised that we did receive a faxed refill request from [facility] for Zetia and Carvedilol. Patient stated that he will take his last pill tonight. Patient was advised that refills will sent in before the day is up.  Chart shows all medications were sent in to the pharmacy at his physical 06/13/21 with refills. Called and spoke to [facility] pharmacist at [facility] and was advised that they have the scripts on file for the patient. Jinny Blossom stated that she does not know why the system did not show the new script unless patient was putting in an old rx number. Vilinda Boehringer to please go ahead and fill the scripts for the patient.  Called patient and advised him that [facility] does have his scripts on file and will be filling them from him. Patient stated that he does not know why they told him that they were waiting to hear back from his doctor. Advised patient that he can discuss that with the pharmacist but they are getting the prescriptions ready for him to pick up. Patient thanked me for my help with this issue.

## 2021-08-05 ENCOUNTER — Other Ambulatory Visit: Payer: Self-pay | Admitting: Orthopaedic Surgery

## 2021-08-09 NOTE — Progress Notes (Signed)
[facility] Summary Report / Loop Recorder 

## 2021-08-11 ENCOUNTER — Other Ambulatory Visit: Payer: Self-pay

## 2021-08-11 MED ORDER — BUTALBITAL-APAP-CAFFEINE 50-325-40 MG PO TABS: 1.0000 | ORAL_TABLET | Freq: Three times a day (TID) | ORAL | 0 refills | Status: AC | PRN

## 2021-08-11 NOTE — Progress Notes (Signed)
Previously prescribed PRN migraine/headache. Last refilled 2019.  Will refill x1.

## 2021-08-15 ENCOUNTER — Telehealth: Payer: Self-pay | Admitting: Internal Medicine

## 2021-08-15 NOTE — Telephone Encounter (Signed)
STAT if HR is under 50 or over 120 (normal HR is 60-100 beats per minute)  What is your heart rate? 37  Do you have a log of your heart rate readings (document readings)? No   Do you have any other symptoms? No

## 2021-08-15 NOTE — Telephone Encounter (Signed)
The patient is reporting that for the last few nights during the night when he wakes up he uses the pulse ox and checks his HR.  He is finding his HR as low as 37.  During the day he does not check it.  During the night it does come up to 70s with activity.  He feels it takes a bit of moving around for this to happen tho.  He denies lightheadedness, dizziness or fatigue.  No other concerns.  We discussed normal resting HRs can be slower especially if he'd just been asleep and questioning accuracy of just one reading as a moment in time and not a sustained HR if it is irregular.   He is aware I will route to device and to Dr. Caryl Comes and his nurse for review and recommendations. He is on Coreg 6.25 mg BID.

## 2021-08-15 NOTE — Telephone Encounter (Signed)
Spoke with patient informed him that his loop recorder was implanted for cryptogenic stroke and looking for atrial fibrillation therefore brady and pause detection were not turned on informed patient that if he starts having dizziness or syncopal episode to call office and would bring patient in to turn these parameters on in his loop recorder patient voiced understanding

## 2021-08-22 ENCOUNTER — Ambulatory Visit (INDEPENDENT_AMBULATORY_CARE_PROVIDER_SITE_OTHER): Payer: Medicare Other

## 2021-08-22 DIAGNOSIS — I63449 Cerebral infarction due to embolism of unspecified cerebellar artery: Secondary | ICD-10-CM

## 2021-08-25 LAB — CUP PACEART REMOTE DEVICE CHECK
Date Time Interrogation Session: 20230621150319
Implantable Pulse Generator Implant Date: 20210414

## 2021-09-01 NOTE — Telephone Encounter (Signed)
Brady programmed on at 40 bpm with 12 beat duration. Also pause alert programmed on at 4 seconds. Consulted with Del from [facility] for clinic programming.

## 2021-09-13 NOTE — Progress Notes (Signed)
[facility] Summary Report / Loop Recorder 

## 2021-09-15 DIAGNOSIS — C44519 Basal cell carcinoma of skin of other part of trunk: Secondary | ICD-10-CM | POA: Diagnosis not present

## 2021-09-15 DIAGNOSIS — Z85828 Personal history of other malignant neoplasm of skin: Secondary | ICD-10-CM | POA: Diagnosis not present

## 2021-09-15 DIAGNOSIS — D225 Melanocytic nevi of trunk: Secondary | ICD-10-CM | POA: Diagnosis not present

## 2021-09-15 DIAGNOSIS — C44219 Basal cell carcinoma of skin of left ear and external auricular canal: Secondary | ICD-10-CM | POA: Diagnosis not present

## 2021-09-15 DIAGNOSIS — L821 Other seborrheic keratosis: Secondary | ICD-10-CM | POA: Diagnosis not present

## 2021-09-21 ENCOUNTER — Encounter: Payer: Self-pay | Admitting: Family Medicine

## 2021-09-26 ENCOUNTER — Ambulatory Visit (INDEPENDENT_AMBULATORY_CARE_PROVIDER_SITE_OTHER): Payer: Medicare Other

## 2021-09-26 DIAGNOSIS — I63449 Cerebral infarction due to embolism of unspecified cerebellar artery: Secondary | ICD-10-CM | POA: Diagnosis not present

## 2021-09-27 LAB — CUP PACEART REMOTE DEVICE CHECK
Date Time Interrogation Session: 20230715230928
Implantable Pulse Generator Implant Date: 20210414

## 2021-09-30 ENCOUNTER — Telehealth: Payer: Self-pay | Admitting: *Deleted

## 2021-09-30 NOTE — Telephone Encounter (Signed)
Ortho bundle 1 year call completed. ?

## 2021-10-16 IMAGING — CR DG CHEST 2V
1 series · 2 of 2 positions shown · non-contrast
Comparison: 10/25/2020

CLINICAL DATA: Chest pain and shortness of breath.

EXAM:
CHEST - 2 VIEW

[Series 1: dg chest 2 view · 0.14mm/px · 2 of 2 slices shown]
[im 1/2]
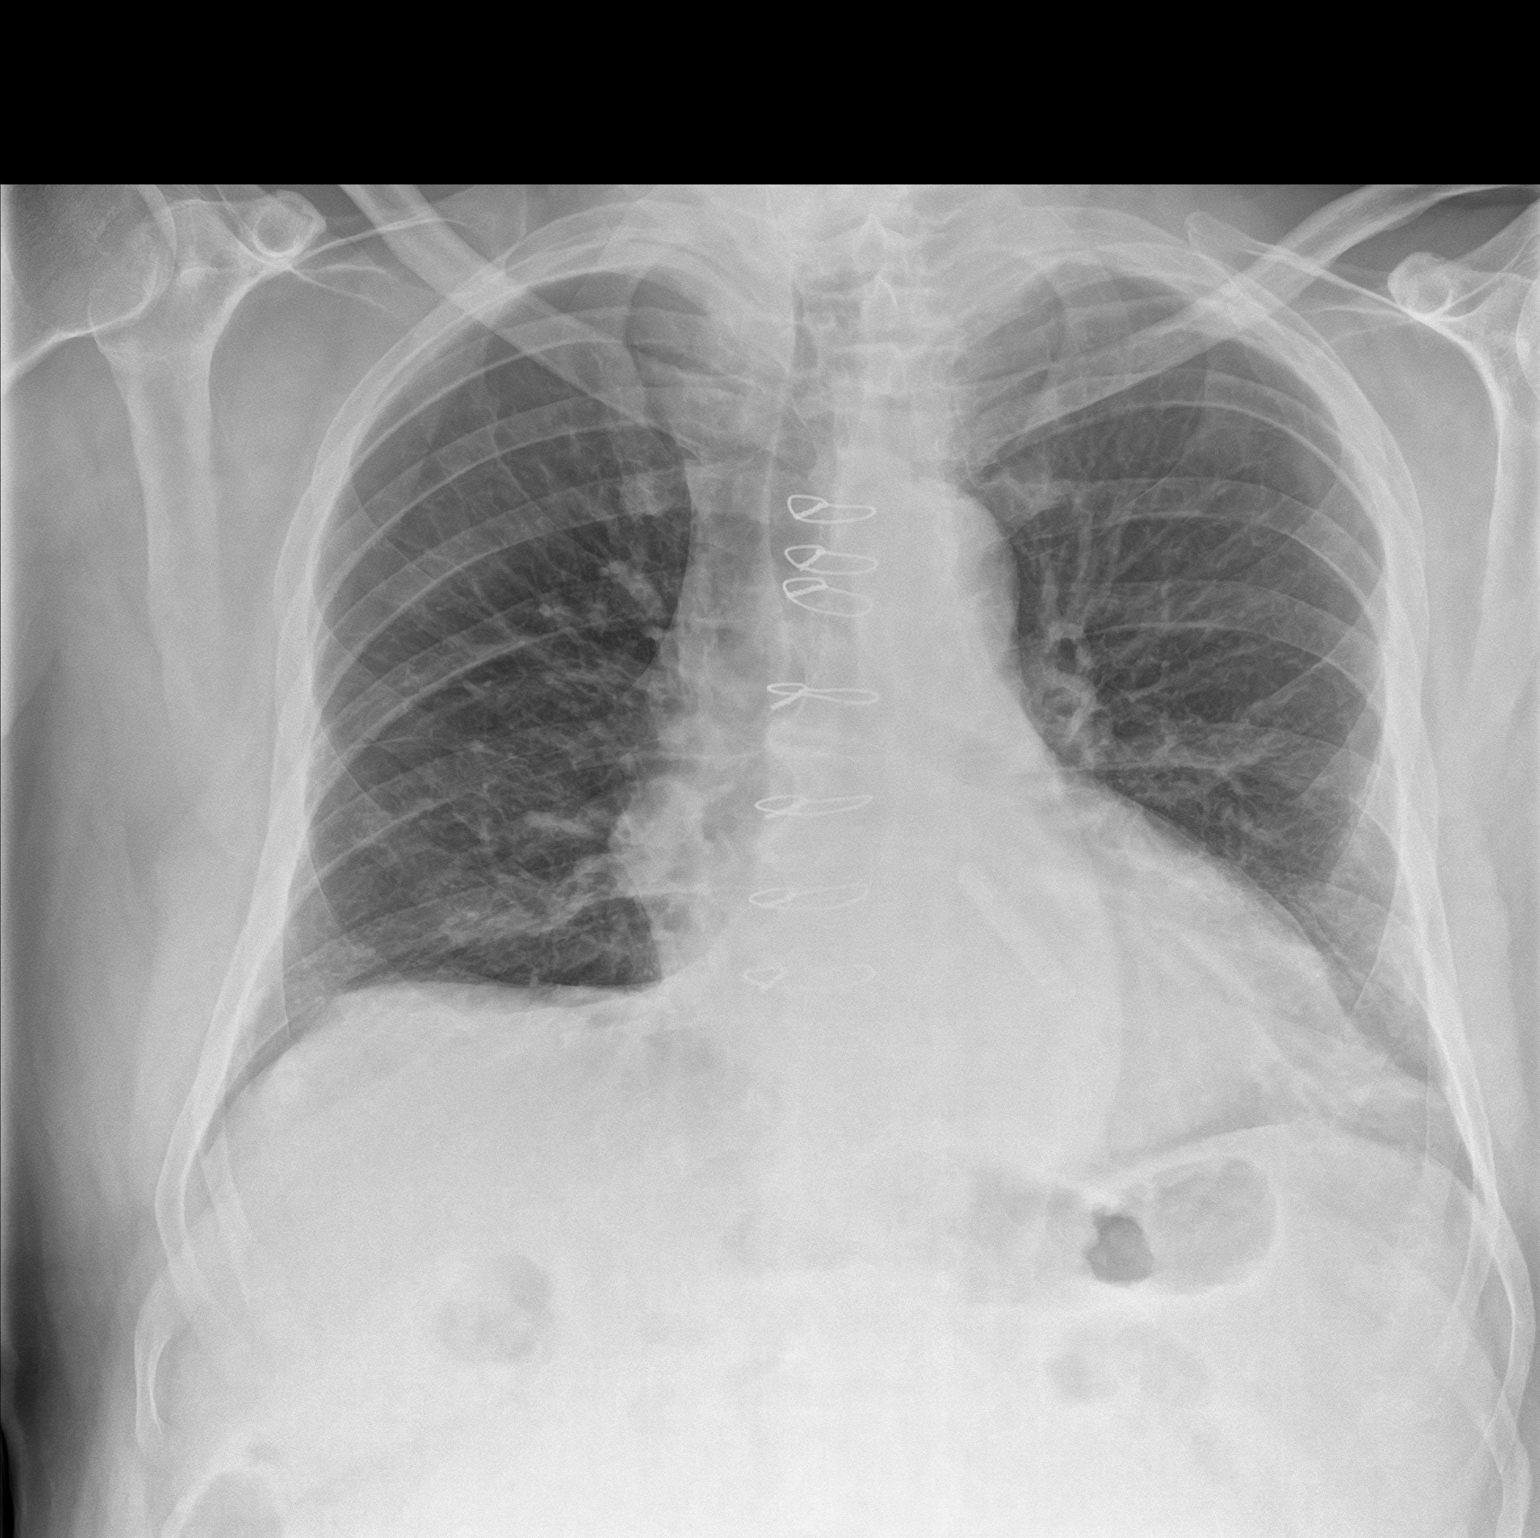
[im 2/2]
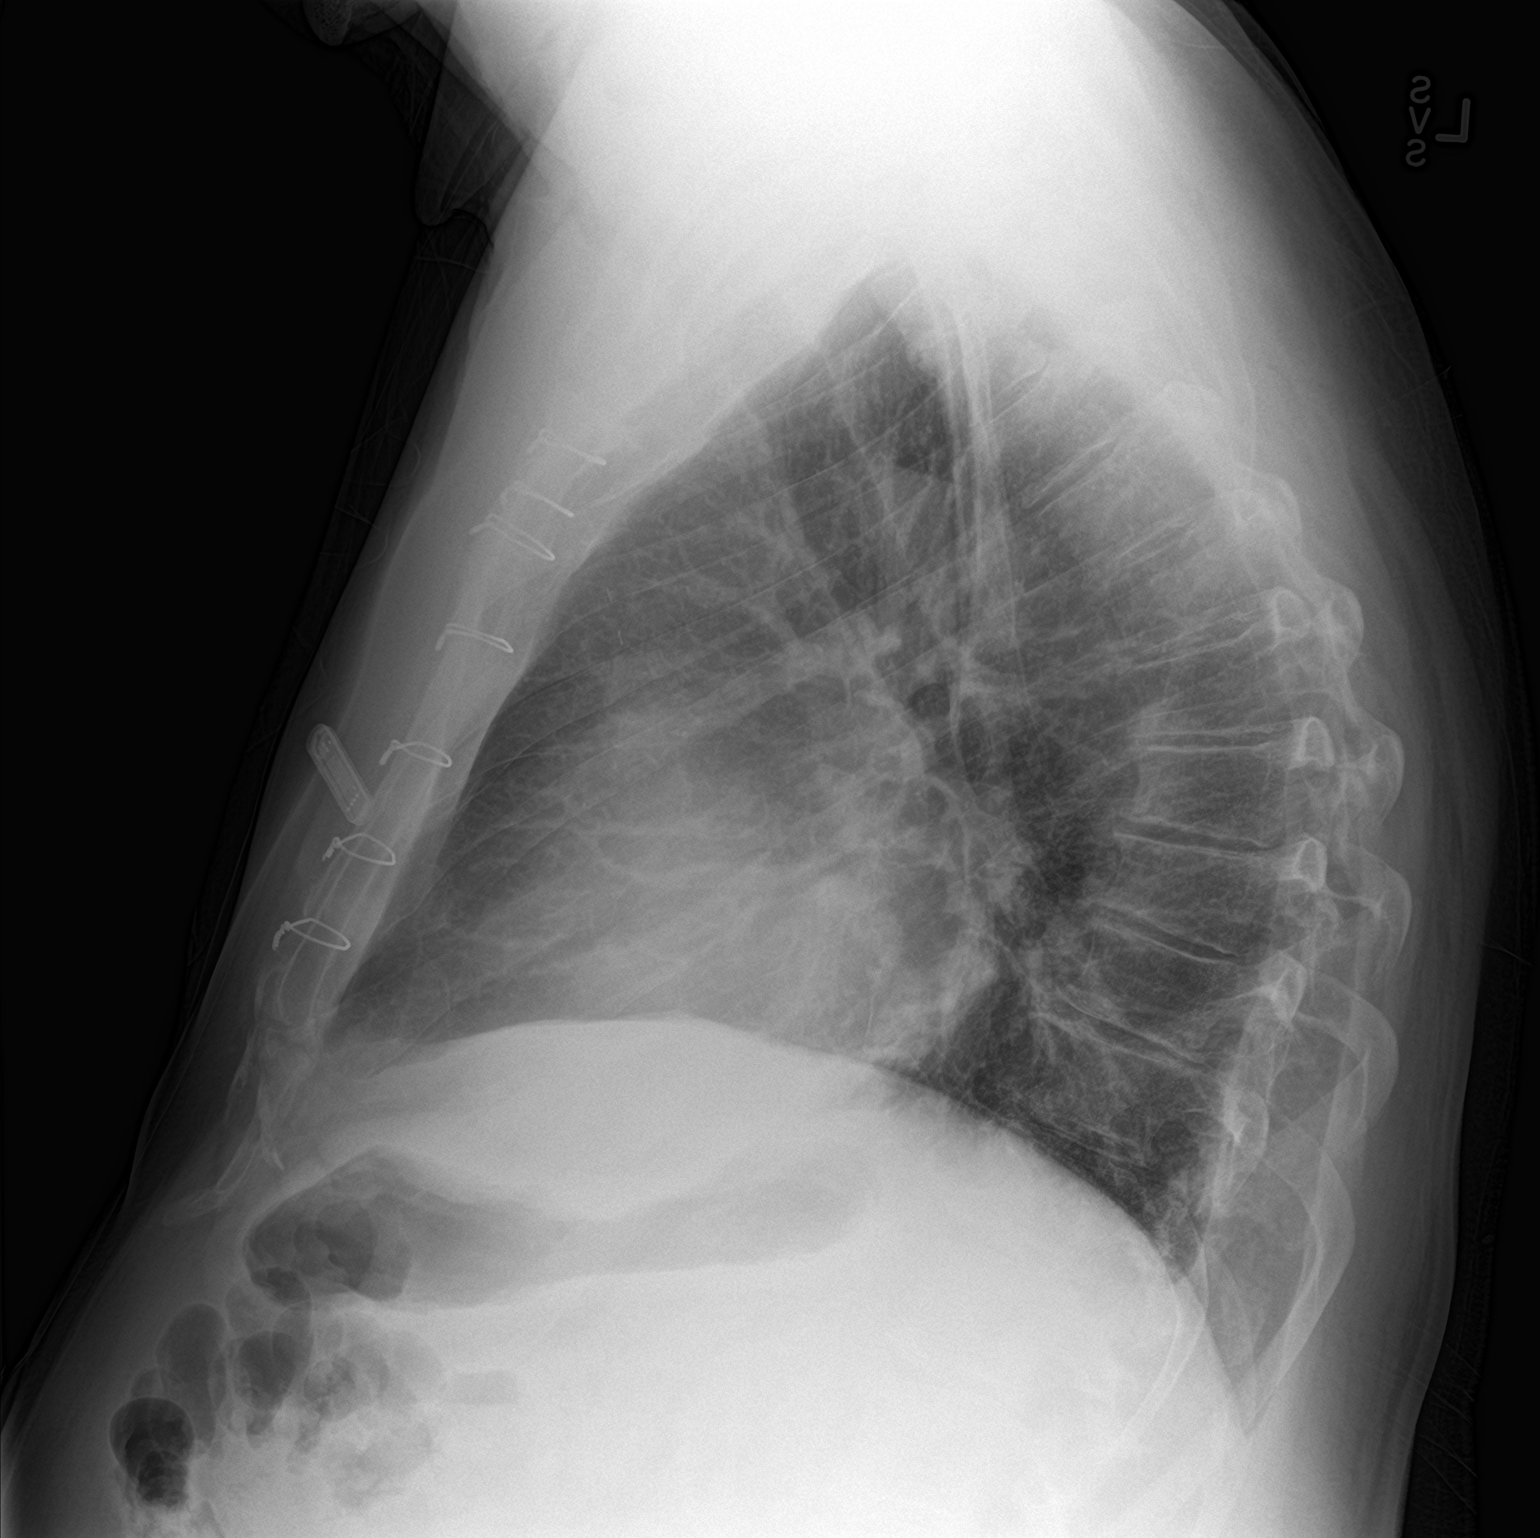

[2 of 2 positions shown; findings below may reference images not displayed]

FINDINGS: Again noted is mild elevation of the right hemidiaphragm. Cardiac
loop recorder is present in the anterior chest soft tissues. Heart
size is slightly prominent and stable. Patient has median sternotomy
wires. No focal airspace disease or lung consolidation. No large
pleural effusions. No acute bone abnormality.
IMPRESSION: No acute cardiopulmonary disease.

## 2021-10-20 DIAGNOSIS — C44219 Basal cell carcinoma of skin of left ear and external auricular canal: Secondary | ICD-10-CM | POA: Diagnosis not present

## 2021-10-25 NOTE — Telephone Encounter (Signed)
Noted  Referral updated 

## 2021-10-31 ENCOUNTER — Ambulatory Visit (INDEPENDENT_AMBULATORY_CARE_PROVIDER_SITE_OTHER): Payer: Medicare Other

## 2021-10-31 DIAGNOSIS — I63449 Cerebral infarction due to embolism of unspecified cerebellar artery: Secondary | ICD-10-CM

## 2021-10-31 NOTE — Progress Notes (Signed)
[facility] Summary Report / Loop Recorder 

## 2021-11-01 LAB — CUP PACEART REMOTE DEVICE CHECK
Date Time Interrogation Session: 20230829080646
Implantable Pulse Generator Implant Date: 20210414

## 2021-11-10 ENCOUNTER — Telehealth: Payer: Self-pay

## 2021-11-10 NOTE — Patient Outreach (Signed)
  Care Coordination   11/10/2021 Name: Alec Maldonado MRN: [id number] DOB: September 17, 1950   Care Coordination Outreach Attempts:  An unsuccessful telephone outreach was attempted today to offer the patient information about available care coordination services as a benefit of their health plan.   Follow Up Plan:  Additional outreach attempts will be made to offer the patient care coordination information and services.   Encounter Outcome:  No Answer  Care Coordination Interventions Activated:  No   Care Coordination Interventions:  No, not indicated    Enzo Montgomery, RN,BSN,CCM [facility] Telephonic Care Management Coordinator Direct Phone: [phone] Toll Free: [phone] Fax: [phone]

## 2021-11-15 ENCOUNTER — Telehealth: Payer: Self-pay

## 2021-11-15 NOTE — Patient Outreach (Signed)
  Care Coordination   11/15/2021 Name: Alec Maldonado MRN: [id number] DOB: June 20, 1950   Care Coordination Outreach Attempts:  A second unsuccessful outreach was attempted today to offer the patient with information about available care coordination services as a benefit of their health plan.     Follow Up Plan:  Additional outreach attempts will be made to offer the patient care coordination information and services.   Encounter Outcome:  No Answer  Care Coordination Interventions Activated:  No   Care Coordination Interventions:  No, not indicated    Enzo Montgomery, RN,BSN,CCM [facility] Telephonic Care Management Coordinator Direct Phone: [phone] Toll Free: [phone] Fax: [phone]

## 2021-11-21 ENCOUNTER — Telehealth: Payer: Self-pay

## 2021-11-21 NOTE — Patient Outreach (Signed)
  Care Coordination   Initial Visit Note   11/21/2021 Name: Alec Maldonado MRN: [id number] DOB: 1950/05/01  Alec Maldonado is a 71 y.o. year old male who sees Ria Bush, MD for primary care. I spoke with  [name] by phone today.  What matters to the patients health and wellness today?  Denies any current health needs or concerns. RN CM attempted to complete assessment questions -assessing for med adherence, affordability and any barriers but patient reported-"that's all you need to know" and then hung up the phone.     Goals Addressed   None     SDOH assessments and interventions completed:  No     Care Coordination Interventions Activated:  No  Care Coordination Interventions:  No, not indicated   Follow up plan: No further intervention required.   Encounter Outcome:  Pt. Visit Completed   Enzo Montgomery, RN,BSN,CCM [facility] Telephonic Care Management Coordinator Direct Phone: [phone] Toll Free: [phone] Fax: [phone]

## 2021-11-24 NOTE — Progress Notes (Signed)
[facility] Summary Report / Loop Recorder 

## 2021-12-05 ENCOUNTER — Ambulatory Visit (INDEPENDENT_AMBULATORY_CARE_PROVIDER_SITE_OTHER): Payer: Medicare Other

## 2021-12-05 DIAGNOSIS — I63449 Cerebral infarction due to embolism of unspecified cerebellar artery: Secondary | ICD-10-CM | POA: Diagnosis not present

## 2021-12-06 LAB — CUP PACEART REMOTE DEVICE CHECK
Date Time Interrogation Session: 20231003111506
Implantable Pulse Generator Implant Date: 20210414

## 2021-12-13 ENCOUNTER — Other Ambulatory Visit (INDEPENDENT_AMBULATORY_CARE_PROVIDER_SITE_OTHER): Payer: Medicare Other

## 2021-12-13 DIAGNOSIS — N401 Enlarged prostate with lower urinary tract symptoms: Secondary | ICD-10-CM

## 2021-12-13 DIAGNOSIS — N3943 Post-void dribbling: Secondary | ICD-10-CM | POA: Diagnosis not present

## 2021-12-13 LAB — PSA: PSA: 0.72 ng/mL (ref 0.10–4.00)

## 2021-12-21 NOTE — Progress Notes (Signed)
[facility] Summary Report / Loop Recorder 

## 2022-01-09 ENCOUNTER — Ambulatory Visit (INDEPENDENT_AMBULATORY_CARE_PROVIDER_SITE_OTHER): Payer: Medicare Other

## 2022-01-09 DIAGNOSIS — I63449 Cerebral infarction due to embolism of unspecified cerebellar artery: Secondary | ICD-10-CM

## 2022-01-10 LAB — CUP PACEART REMOTE DEVICE CHECK
Date Time Interrogation Session: 20231103230443
Implantable Pulse Generator Implant Date: 20210414

## 2022-01-30 ENCOUNTER — Telehealth: Payer: Self-pay

## 2022-01-30 NOTE — Telephone Encounter (Signed)
CV Remote Alert Received.  ILR alert report received. Battery status OK. Normal device function. No new symptom, brady, or pause episodes. No new AF episodes. AF burden is 0% of the time.  There were 2 tachycardia events detected, these were 2 consecuative events that were approximately 7 and 60 minutes in duration and had median heart rates of 200 bpm.   Monthly summary reports and ROV/PRN.   Patient reports he was riding his lawn mower and felt palpitations begin. States he took an extra dose Coreg 6.25 mg, then after 15 minutes felt improvement. Reports of chest discomfort during this time for several minutes. Denies strenuous activity during time frame of symptoms or prior. Patient advised of ED precautions if chest pain occurs again. Voiced understanding. Advised I will forward to Dr. Caryl Comes for review and we will call with any changes. Voiced understanding.

## 2022-02-01 NOTE — Telephone Encounter (Signed)
Alec Maldonado, happy Thanksgiving and 2 other things.  1-this patient I saw once in the hospital for cryptogenic stroke, noted he had a history of SVT, his loop recorder shows SVT again.  You want to see him to discuss medical therapy versus an ablation in which case one of the other guys would see him, would you like me to have that conversation with him?  2-that friend of mine April hall heads back to [place] in early January.  The office was able to give her an appointment in early to mid January: ((.  Everybody is busy.  I do not know if you will be able to see her perhaps in December or not.  Let me know what your thoughts are about that thanks.

## 2022-02-13 ENCOUNTER — Ambulatory Visit (INDEPENDENT_AMBULATORY_CARE_PROVIDER_SITE_OTHER): Payer: Medicare Other

## 2022-02-13 DIAGNOSIS — I63449 Cerebral infarction due to embolism of unspecified cerebellar artery: Secondary | ICD-10-CM

## 2022-02-13 NOTE — Telephone Encounter (Signed)
Per Dr Caryl Comes pt will need appointment with Jonni Sanger or [facility] for follow up.

## 2022-02-14 LAB — CUP PACEART REMOTE DEVICE CHECK
Date Time Interrogation Session: 20231210230703
Implantable Pulse Generator Implant Date: 20210414

## 2022-02-14 NOTE — Progress Notes (Signed)
[facility] Summary Report / Loop Recorder 

## 2022-03-06 NOTE — Progress Notes (Signed)
Cardiology Office Note Date:  03/06/2022  Patient ID:  Alec Maldonado, Alec Maldonado [birth year], MRN [id number] PCP:  Ria Bush, MD  Cardiologist:  Dr. Harrell Gave Electrophysiologist: Dr. Caryl Comes    Chief Complaint: f/u on arrhythmia   History of Present Illness: Alec Maldonado is a 72 y.o. male with history of VHD s/p MV repair (2001), HLD, SVT (converted with adenosine and vagal maneuver), migraines, stroke, DM, RBBB  Atrial fibrillation detected 07/30/20 loop interrogation Cryptogenic stroke: -followed by Dr. Caryl Comes. Per comments, very brief afib, longest event 14 minutes. Given brevity, anticoagulation not started  He saw Dr. Harrell Gave 12/28/20, pt noted a decrease in resting HRs from 60's-70's > 50's-60's, some mild CP, not like that of his SVT. Noted PACs, PVCs, planned to monitor via his loop with no symptoms.  November ILR noted 2 episodes of SVT and Dr. Caryl Comes recommended discussion with the pt management options.  TODAY He reports his SVT goes back about 6 years 3x he sought ER visit, once it stopped spontaneously after arrival, once treated successfully with adenosine and another success with vagal.  He is certainly aware of it, feels like his heart is going to pound right out of his chest feels a bit heavy when in it as well.  The episode that happened in Nov he felt it, after a few minutes took a extra coreg, laid on the floor, put his feet up/beared down (like they showed him at the ER once) and it stopped.  He is doing very well, this the first episode in years (the first since device implant (2021). No CP, palpitations otherwise No SOB, DOE    Device information MDT LINQ II implanted 06/18/19, cryptogenic stroke   Past Medical History:  Diagnosis Date   Allergy    Aortic insufficiency    Mild, echo, January, 2014   Basal cell carcinoma (BCC) of multiple sites 2023   R lateral upper back, l ear antihelix (Dr Ronnald Ramp)   [facility] (basal cell carcinoma), back 2017    s/p excisional biospy by derm   BPH (benign prostatic hypertrophy)    Central retinal artery occlusion of left eye 06/16/2019   S/p ER eval 06/2019 Saw neurology Leonie Man) - left eye vision loss due to central retinal artery occlusion in April 2021, MRI scan showing silent cerebral embolic infarcts of cryptogenic etiology   Chest discomfort    nuclear 06/24/09, suggestion prior small inferior MI with moderate peri-infarvt ischemia versus variable diaphragmatic attenuation   Colon polyps    colonoscopy 07/02/06, repeat in 10 years   Coronary artery disease    Dyslipidemia    Dysplastic nevus of trunk 08/20/2012   Dysrhythmia    supraventricular tachycardia, paplitations   Ejection fraction    EF 55%, echo, October, 2010  //   EF 45-50%,  catheterization, 2011   Heart murmur    History 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 TEE 06/2019 IMPRESSION: 1. Left ventricular ejection fraction, by estimation, is 50 to 55%. The left ventricle has low normal function. The left ventricle has no regional wall motion abnormalities. There is severe asymmetric left ventricular hypertrophy 2. Right ventricular systolic function is normal.   Hyperlipidemia    Migraines    rare   Mitral regurgitation    Mitral valve repair 2001   Neurofibroma of foot 04/2017   s/p removal by derm  Palpitations 07/2009    helped with carvedilol    Pleurisy    Postural dizziness    mild, no orthostatic changes by blood pressure check   Pre-diabetes    Pre-syncope    January, 2014   Right ventricular dysfunction    Mild/moderate right ventricular dysfunction by echo, January, 2014, no tricuspid regurgitation, therefore right heart pressure could not be estimated.   Shortness of breath    Status post mitral valve repair    2001,  //  echo, October, 2010, good function of the  repaired mitral valve   Stroke [facility]) 2021   lt eye    SVT (supraventricular tachycardia) ([place]) 03/11/2013   Isolated episodes - last 03/2013 after flu treated with adenosine cardioversion in ER.    Thyroid disease     Past Surgical History:  Procedure Laterality Date   aneurysmal dilatation     proximal LAD cath 2001, reoeat catheter 2004, no significant change   aneurysmal dilatiation  07/15/09   no change, normal right heart filling pressures and LVEDP- EF 45-50%   APPENDECTOMY  1970   BUBBLE STUDY  06/18/2019   Procedure: BUBBLE STUDY;  Surgeon: Sueanne Margarita, MD;  Location: [facility] ENDOSCOPY;  Service: Cardiovascular;;   CARDIAC CATHETERIZATION  2010   mild global LV dysfunction EF 45-50%, no significant CAD obstruction, aneurysmal dilatation LAD   COLONOSCOPY  2008   COLONOSCOPY  06/2016   diverticulosis, rpt 10 yrs Ardis Hughs)   CORONARY ANGIOPLASTY  09/1999   severe MR o/w normal limits   CORONARY ANGIOPLASTY  12/19/02   pos. CAD, mild LV dysfunction- med treatment   EYE SURGERY Left    HERNIA REPAIR  01/11/05   left, Dr. Hassell Done   I & D EXTREMITY Right 02/04/2015   Procedure: IRRIGATION AND DEBRIDEMENT RIGHT INDEX FINGER OPEN FRACTURE AND REPAIR DIGITAL NERVE AND ARTERY;  Surgeon: Leanora Cover, MD;  Location: [facility];  Service: Orthopedics;  Laterality: Right;   LOOP RECORDER INSERTION N/A 06/18/2019   Procedure: LOOP RECORDER INSERTION;  Surgeon: Deboraha Sprang, MD;  Location: [facility];  Service: Cardiovascular;  Laterality: N/A;   MITRAL VALVE REPAIR  01/22/00   good mitral valve function- echo 12/2008- EF 55% / nuclear 06/2009- EF 36%,? reliable? / EF 45-50%, cath rated 02/2010   ORIF ELBOW FRACTURE     left   POLYPECTOMY     TEE WITHOUT CARDIOVERSION N/A 06/18/2019   Procedure: TRANSESOPHAGEAL ECHOCARDIOGRAM (TEE);  Surgeon: Sueanne Margarita, MD;  Location: [facility] ENDOSCOPY;  Service: Cardiovascular;  Laterality: N/A;   THYROID SURGERY     THYROIDECTOMY     left, due to  goiter [place] Right 06/29/2020   Procedure: RIGHT TOTAL KNEE ARTHROPLASTY;  Surgeon: Garald Balding, MD;  Location: [facility];  Service: Orthopedics;  Laterality: Right;    Current Outpatient Medications  Medication Sig Dispense Refill   acetaminophen (TYLENOL) 500 MG tablet Take 500 mg by mouth every 6 (six) hours as needed.     aspirin 81 MG EC tablet Take 1 tablet (81 mg total) by mouth daily. Swallow whole. 30 tablet 12   butalbital-acetaminophen-caffeine (FIORICET) 50-325-40 MG tablet Take 1 tablet by mouth 3 (three) times daily as needed for migraine. 20 tablet 0   carvedilol (COREG) 6.25 MG tablet Take 1 tablet (6.25 mg total) by mouth 2 (two) times daily with a meal. 180 tablet 3   chlorpheniramine-HYDROcodone (TUSSIONEX PENNKINETIC ER) 10-8 MG/5ML Take 5 mLs by  mouth at bedtime as needed for cough. 90 mL 0   ezetimibe (ZETIA) 10 MG tablet TAKE 1 TABLET(10 MG) BY MOUTH DAILY 90 tablet 3   finasteride (PROSCAR) 5 MG tablet Take 1 tablet (5 mg total) by mouth daily. 90 tablet 3   Omega-3 Fatty Acids (FISH OIL) 1200 MG CAPS Take 1,200 mg by mouth daily.     predniSONE (DELTASONE) 20 MG tablet Take 2 tablets (40 mg total) by mouth daily. 10 tablet 0   simvastatin (ZOCOR) 80 MG tablet Take 1 tablet (80 mg total) by mouth daily at 6 PM. 90 tablet 3   tamsulosin (FLOMAX) 0.4 MG CAPS capsule Take 2 capsules (0.8 mg total) by mouth at bedtime. 180 capsule 3   No current facility-administered medications for this visit.    Allergies:   Patient has no known allergies.   Social History:  The patient  reports that he has never smoked. He has never used smokeless tobacco. He reports that he does not drink alcohol and does not use drugs.   Family History:  The patient's family history includes Alzheimer's disease in his mother; Arthritis in his father; Benign prostatic hyperplasia in his father; Heart disease in his brother, father, and mother.  ROS:  Please see the  history of present illness.    All other systems are reviewed and otherwise negative.   PHYSICAL EXAM:  VS:  There were no vitals taken for this visit. BMI: There is no height or weight on file to calculate BMI. Well nourished, well developed, in no acute distress HEENT: normocephalic, atraumatic Neck: no JVD, carotid bruits or masses Cardiac:  RRR; no significant murmurs, no rubs, or gallops Lungs:  CTA b/l, no wheezing, rhonchi or rales Abd: soft, nontender MS: no deformity or atrophy Ext: no edema Skin: warm and dry, no rash Neuro:  No gross deficits appreciated Psych: euthymic mood, full affect  ILR site is stable, no tethering or discomfort   EKG:  Done today and reviewed by myself shows  SR, 2 PVCs, RBBB (old), 73bpm  Device interrogation done today and reviewed by myself:  Battery is good, R waves are 0.19 2 tachy episodes are SVT with some PVCs, fast towards 200bpm Several episodes labeled AFib All available EGMs are reviewed Many are significant artifact and unable to establish underlying ryhythm. Others are SR with PVCs, PACs    Cardiac MRI 08/11/2019: FINDINGS: The patient is s/p sternotomy. Limited images of the lung fields showed no gross abnormalities.   Normal left ventricular size with normal wall thickness. LV EF 54%, mild mid inferolateral wall hypokinesis with septal bounce consistent with prior cardiac surgery noted. Mildly dilated right ventricle with mildly decreased systolic function, EF 66%. Mild biatrial enlargement. Lipomatous interatrial septal hypertrophy. Trileaflet aortic valve with mild regurgitation, no significant stenosis. The patient is s/p mitral valve repair, trivial mitral regurgitation noted.   On delayed enhancement imaging, there was no myocardial late gadolinium enhancement (LGE).   Measurements:   LVEDV 171 mL  LVSV 92 mL   LVEF 54%   RVEDV 190 mL  RVSV 70 mL   RVEF 37%   IMPRESSION: 1. Normal LV size and wall  thickness, no evidence for hypertrophic cardiomyopathy. EF 54%, mild mid inferolateral hypokinesis with septal bounce consistent with prior surgery.   2. Mildly dilated RV with mildly decreased systolic function, EF 29%.   3.  S/p mitral valve repair with minimal MR noted.   4. No myocardial LGE, so no definitive evidence  for prior MI, myocarditis, or infiltrative disease.   06/18/19: TEE 1. Left ventricular ejection fraction, by estimation, is 50 to 55%. The  left ventricle has low normal function. The left ventricle has no regional  wall motion abnormalities.   2. Right ventricular systolic function is normal. The right ventricular  size is normal.   3. Left atrial size was severely dilated. No left atrial/left atrial  appendage thrombus was detected.   4. S/P MV repair. The MV is degenerative with thickened and calcified  leaflets. There is some restricted leaflet motion but no mitral stenosis  with mean MVG of 51mHg. There is mild mitral regurgitation.   5. The aortic valve is tricuspid. Aortic valve regurgitation is mild.  Mild aortic valve sclerosis is present, with no evidence of aortic valve  stenosis.   6. The inferior vena cava is normal in size with greater than 50%  respiratory variability, suggesting right atrial pressure of 3 mmHg.   7. The interatrial septum appears to be lipomatous. No atrial level shunt  detected by color flow Doppler. Agitated saline contrast was given  intravenously to evaluate for intracardiac shunting. There is no evidence  of a patent foramen ovale. There is no   evidence of an atrial septal defect.   Conclusion(s)/Recommendation(s): No source of embolism noted.  FINDINGS    06/16/19: TTE IMPRESSIONS  1. Left ventricular ejection fraction, by estimation, is 50 to 55%. The  left ventricle has low normal function. The left ventricle has no regional  wall motion abnormalities. The left ventricular internal cavity size was  mildly dilated.  There is severe  asymmetric left ventricular hypertrophy. Left ventricular diastolic  parameters are consistent with Grade I diastolic dysfunction (impaired  relaxation).   2. Right ventricular systolic function is normal. The right ventricular  size is normal. There is normal pulmonary artery systolic pressure.   3. Left atrial size was severely dilated. (measured 5.3cm)  4. The mitral valve is degenerative. Trivial mitral valve regurgitation.  No evidence of mitral stenosis.   5. The aortic valve is tricuspid. Aortic valve regurgitation is trivial.  No aortic stenosis is present.   6. Aortic dilatation noted. There is mild dilatation of the ascending  aorta.   7. The inferior vena cava is normal in size with greater than 50%  respiratory variability, suggesting right atrial pressure of 3 mmHg.     Recent Labs: 06/06/2021: ALT 17; BUN 12; Creatinine, Ser 1.15; Hemoglobin 12.7; Platelets 247.0; Potassium 4.0; Sodium 141  06/06/2021: Cholesterol 115; HDL 33.30; LDL Cholesterol 59; Total CHOL/HDL Ratio 3; Triglycerides 114.0; VLDL 22.8   CrCl cannot be calculated (Patient's most recent lab result is older than the maximum 21 days allowed.).   Wt Readings from Last 3 Encounters:  06/30/21 244 lb 6 oz (110.8 kg)  06/13/21 239 lb (108.4 kg)  06/06/21 242 lb (109.8 kg)     Other studies reviewed: Additional studies/records reviewed today include: summarized above  ASSESSMENT AND PLAN:  ILR Intact function  SVT He is quite happy with his burden and strategy for management (vagal maneuver +/- and extra coreg) Would not want to start additional/more meds for an infrequent SVT He is not at all interested in ablation/procedural strategy    Paroxysmal AFib CHA2DS2Vasc is 5, not on a/c, reportedly a single event of short duration, planned to monitor via his loop No clear/ytue Afib noted today Will continue to defer monitoring/decisions with Dr. LCristopher Estimable  VHD w/MVR Functioning well on  last  echo  LVH No LVH, LGE or infiltrative disease on MRI  6. PVCs PVC counter/detection turned on to establish burden Intermittent today with me   Disposition: F/u with EP in clinic in a year, sooner if needed.  C/w dr. Harrell Gave as scheduled/recommended by her.   Current medicines are reviewed at length with the patient today.  The patient did not have any concerns regarding medicines.  Venetia Night, PA-C 03/06/2022 8:31 AM     [address] [place] [address] [phone] (office)  [phone] (fax)

## 2022-03-10 ENCOUNTER — Ambulatory Visit: Payer: Medicare Other | Attending: Physician Assistant | Admitting: Physician Assistant

## 2022-03-10 ENCOUNTER — Encounter: Payer: Self-pay | Admitting: Physician Assistant

## 2022-03-10 VITALS — BP 118/72 | HR 73 | Ht 70.0 in | Wt 241.8 lb

## 2022-03-10 DIAGNOSIS — Z4509 Encounter for adjustment and management of other cardiac device: Secondary | ICD-10-CM

## 2022-03-10 DIAGNOSIS — I493 Ventricular premature depolarization: Secondary | ICD-10-CM

## 2022-03-10 DIAGNOSIS — I480 Paroxysmal atrial fibrillation: Secondary | ICD-10-CM | POA: Diagnosis not present

## 2022-03-10 DIAGNOSIS — I471 Supraventricular tachycardia, unspecified: Secondary | ICD-10-CM

## 2022-03-10 LAB — CUP PACEART INCLINIC DEVICE CHECK
Date Time Interrogation Session: 20240105104702
Implantable Pulse Generator Implant Date: 20210414

## 2022-03-10 NOTE — Patient Instructions (Signed)
Medication Instructions:   Your physician recommends that you continue on your current medications as directed. Please refer to the Current Medication list given to you today.  *If you need a refill on your cardiac medications before your next appointment, please call your pharmacy*   Lab Work: [facility]   If you have labs (blood work) drawn today and your tests are completely normal, you will receive your results only by: [facility] (if you have MyChart) OR A paper copy in the mail If you have any lab test that is abnormal or we need to change your treatment, we will call you to review the results.   Testing/Procedures: NONE ORDERED  TODAY     Follow-Up: At [facility], you and your health needs are our priority.  As part of our continuing mission to provide you with exceptional heart care, we have created designated Provider Care Teams.  These Care Teams include your primary Cardiologist (physician) and Advanced Practice Providers (APPs -  Physician Assistants and Nurse Practitioners) who all work together to provide you with the care you need, when you need it.  We recommend signing up for the patient portal called "MyChart".  Sign up information is provided on this After Visit Summary.  MyChart is used to connect with patients for Virtual Visits (Telemedicine).  Patients are able to view lab/test results, encounter notes, upcoming appointments, etc.  Non-urgent messages can be sent to your provider as well.   To learn more about what you can do with MyChart, go to [email or web address].    Your next appointment:   1 year(s)  The format for your next appointment:   In Person  Provider:   You may see Dr. Benjamine Mola  or one of the following Advanced Practice Providers on your designated Care Team:   Tommye Standard, [place] Legrand Como "Jonni Sanger" Chalmers Cater, [place]  Other Instructions   Important Information About [place]

## 2022-03-15 DIAGNOSIS — L820 Inflamed seborrheic keratosis: Secondary | ICD-10-CM | POA: Diagnosis not present

## 2022-03-15 DIAGNOSIS — D485 Neoplasm of uncertain behavior of skin: Secondary | ICD-10-CM | POA: Diagnosis not present

## 2022-03-20 ENCOUNTER — Ambulatory Visit (INDEPENDENT_AMBULATORY_CARE_PROVIDER_SITE_OTHER): Payer: Medicare Other

## 2022-03-20 DIAGNOSIS — I63449 Cerebral infarction due to embolism of unspecified cerebellar artery: Secondary | ICD-10-CM

## 2022-03-22 LAB — CUP PACEART REMOTE DEVICE CHECK
Date Time Interrogation Session: 20240112230619
Implantable Pulse Generator Implant Date: 20210414

## 2022-03-23 NOTE — Progress Notes (Signed)
[facility] Summary Report / Loop Recorder

## 2022-04-14 DIAGNOSIS — Z9842 Cataract extraction status, left eye: Secondary | ICD-10-CM | POA: Diagnosis not present

## 2022-04-14 DIAGNOSIS — H52223 Regular astigmatism, bilateral: Secondary | ICD-10-CM | POA: Diagnosis not present

## 2022-04-14 DIAGNOSIS — Z9841 Cataract extraction status, right eye: Secondary | ICD-10-CM | POA: Diagnosis not present

## 2022-04-24 ENCOUNTER — Ambulatory Visit (INDEPENDENT_AMBULATORY_CARE_PROVIDER_SITE_OTHER): Payer: Medicare Other

## 2022-04-24 DIAGNOSIS — I63449 Cerebral infarction due to embolism of unspecified cerebellar artery: Secondary | ICD-10-CM

## 2022-04-24 LAB — CUP PACEART REMOTE DEVICE CHECK
Date Time Interrogation Session: 20240218230335
Implantable Pulse Generator Implant Date: 20210414

## 2022-05-08 NOTE — Progress Notes (Signed)
[facility] Summary Report / Loop Recorder 

## 2022-05-27 LAB — CUP PACEART REMOTE DEVICE CHECK
Date Time Interrogation Session: 20240322230704
Implantable Pulse Generator Implant Date: 20210414

## 2022-05-29 ENCOUNTER — Ambulatory Visit (INDEPENDENT_AMBULATORY_CARE_PROVIDER_SITE_OTHER): Payer: Medicare Other

## 2022-05-29 DIAGNOSIS — I63449 Cerebral infarction due to embolism of unspecified cerebellar artery: Secondary | ICD-10-CM

## 2022-06-07 NOTE — Progress Notes (Signed)
[facility] Summary Report / Loop Recorder 

## 2022-06-08 ENCOUNTER — Ambulatory Visit (INDEPENDENT_AMBULATORY_CARE_PROVIDER_SITE_OTHER): Payer: Medicare Other

## 2022-06-08 VITALS — Ht 71.0 in | Wt 250.0 lb

## 2022-06-08 DIAGNOSIS — Z0000 Encounter for general adult medical examination without abnormal findings: Secondary | ICD-10-CM | POA: Diagnosis not present

## 2022-06-08 NOTE — Progress Notes (Signed)
I connected with  Alec Maldonado on 06/08/22 by a audio enabled telemedicine application and verified that I am speaking with the correct person using two identifiers.  Patient Location: Home  Provider Location: Home Office  I discussed the limitations of evaluation and management by telemedicine. The patient expressed understanding and agreed to proceed.  Subjective:   Alec Maldonado is a 72 y.o. male who presents for Medicare Annual/Subsequent preventive examination.  Review of Systems      Cardiac Risk Factors include: advanced age (>67men, >48 women);male gender;sedentary lifestyle;dyslipidemia     Objective:    Today's Vitals   06/08/22 1026  Weight: 250 lb (113.4 kg)  Height: 5\' 11"  (1.803 m)   Body mass index is 34.87 kg/m.     06/08/2022   10:36 AM 06/06/2021    2:05 PM 06/25/2020    1:52 PM 05/14/2020    1:08 PM 06/16/2019   12:15 PM 05/10/2018    2:32 PM 06/20/2017   12:31 PM  Advanced Directives  Does Patient Have a Medical Advance Directive? Yes Yes Yes Yes No Yes Yes  Type of Paramedic of [place];Living will [place];Living will [place];Living will [place];Living will  [place];Living will [place];Living will  Does patient want to make changes to medical advance directive? No - Patient declined        Copy of [place] in Chart? Yes - validated most recent copy scanned in chart (See row information) Yes - validated most recent copy scanned in chart (See row information)  Yes - validated most recent copy scanned in chart (See row information)  No - copy requested No - copy requested  Would patient like information on creating a medical advance directive?     No - Patient declined      Current Medications (verified) Outpatient Encounter Medications as of 06/08/2022  Medication Sig   acetaminophen (TYLENOL) 500 MG tablet Take 500  mg by mouth every 6 (six) hours as needed.   aspirin 81 MG EC tablet Take 1 tablet (81 mg total) by mouth daily. Swallow whole.   butalbital-acetaminophen-caffeine (FIORICET) 50-325-40 MG tablet Take 1 tablet by mouth 3 (three) times daily as needed for migraine.   carvedilol (COREG) 6.25 MG tablet Take 1 tablet (6.25 mg total) by mouth 2 (two) times daily with a meal.   ezetimibe (ZETIA) 10 MG tablet TAKE 1 TABLET(10 MG) BY MOUTH DAILY   finasteride (PROSCAR) 5 MG tablet Take 1 tablet (5 mg total) by mouth daily.   Omega-3 Fatty Acids (FISH OIL) 1200 MG CAPS Take 1,200 mg by mouth daily.   simvastatin (ZOCOR) 80 MG tablet Take 1 tablet (80 mg total) by mouth daily at 6 PM.   tamsulosin (FLOMAX) 0.4 MG CAPS capsule Take 2 capsules (0.8 mg total) by mouth at bedtime.   chlorpheniramine-HYDROcodone (TUSSIONEX PENNKINETIC ER) 10-8 MG/5ML Take 5 mLs by mouth at bedtime as needed for cough. (Patient not taking: Reported on 06/08/2022)   predniSONE (DELTASONE) 20 MG tablet Take 2 tablets (40 mg total) by mouth daily. (Patient not taking: Reported on 06/08/2022)   No facility-administered encounter medications on file as of 06/08/2022.    Allergies (verified) Patient has no known allergies.   History: Past Medical History:  Diagnosis Date   Allergy    Aortic insufficiency    Mild, echo, January, 2014   Basal cell carcinoma (BCC) of multiple  sites 2023   R lateral upper back, l ear antihelix (Dr Ronnald Ramp)   [facility] (basal cell carcinoma), back 2017   s/p excisional biospy by derm   BPH (benign prostatic hypertrophy)    Central retinal artery occlusion of left eye 06/16/2019   S/p ER eval 06/2019 Saw neurology Leonie Man) - left eye vision loss due to central retinal artery occlusion in April 2021, MRI scan showing silent cerebral embolic infarcts of cryptogenic etiology   Chest discomfort    nuclear 06/24/09, suggestion prior small inferior MI with moderate peri-infarvt ischemia versus variable diaphragmatic  attenuation   Colon polyps    colonoscopy 07/02/06, repeat in 10 years   Coronary artery disease    Dyslipidemia    Dysplastic nevus of trunk 08/20/2012   Dysrhythmia    supraventricular tachycardia, paplitations   Ejection fraction    EF 55%, echo, October, 2010  //   EF 45-50%,  catheterization, 2011   Heart murmur    History 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 TEE 06/2019 IMPRESSION: 1. Left ventricular ejection fraction, by estimation, is 50 to 55%. The left ventricle has low normal function. The left ventricle has no regional wall motion abnormalities. There is severe asymmetric left ventricular hypertrophy 2. Right ventricular systolic function is normal.   Hyperlipidemia    Migraines    rare   Mitral regurgitation    Mitral valve repair 2001   Neurofibroma of foot 04/2017   s/p removal by derm   Palpitations 07/2009    helped with carvedilol    Pleurisy    Postural dizziness    mild, no orthostatic changes by blood pressure check   Pre-diabetes    Pre-syncope    January, 2014   Right ventricular dysfunction    Mild/moderate right ventricular dysfunction by echo, January, 2014, no tricuspid regurgitation, therefore right heart pressure could not be estimated.   Shortness of breath    Status post mitral valve repair    2001,  //  echo, October, 2010, good function of the repaired mitral valve   Stroke 2021   lt eye    SVT (supraventricular tachycardia) 03/11/2013   Isolated episodes - last 03/2013 after flu treated with adenosine cardioversion in ER.    Thyroid disease    Past Surgical History:  Procedure Laterality Date   aneurysmal dilatation     proximal LAD cath 2001, reoeat catheter 2004, no significant change   aneurysmal dilatiation  07/15/09   no change, normal right heart filling pressures and LVEDP- EF 45-50%    APPENDECTOMY  1970   BUBBLE STUDY  06/18/2019   Procedure: BUBBLE STUDY;  Surgeon: Sueanne Margarita, MD;  Location: [facility] ENDOSCOPY;  Service: Cardiovascular;;   CARDIAC CATHETERIZATION  2010   mild global LV dysfunction EF 45-50%, no significant CAD obstruction, aneurysmal dilatation LAD   COLONOSCOPY  2008   COLONOSCOPY  06/2016   diverticulosis, rpt 10 yrs Ardis Hughs)   CORONARY ANGIOPLASTY  09/1999   severe MR o/w normal limits   CORONARY ANGIOPLASTY  12/19/02   pos. CAD, mild LV dysfunction- med treatment   EYE SURGERY Left    HERNIA REPAIR  01/11/05   left, Dr. Hassell Done   I & D EXTREMITY Right 02/04/2015   Procedure: IRRIGATION AND DEBRIDEMENT RIGHT INDEX FINGER OPEN FRACTURE AND REPAIR DIGITAL NERVE AND ARTERY;  Surgeon: Leanora Cover, MD;  Location: [place];  Service: Orthopedics;  Laterality: Right;   LOOP RECORDER INSERTION N/A 06/18/2019   Procedure: LOOP RECORDER INSERTION;  Surgeon: Deboraha Sprang, MD;  Location: [facility];  Service: Cardiovascular;  Laterality: N/A;   MITRAL VALVE REPAIR  01/22/00   good mitral valve function- echo 12/2008- EF 55% / nuclear 06/2009- EF 36%,? reliable? / EF 45-50%, cath rated 02/2010   ORIF ELBOW FRACTURE     left   POLYPECTOMY     TEE WITHOUT CARDIOVERSION N/A 06/18/2019   Procedure: TRANSESOPHAGEAL ECHOCARDIOGRAM (TEE);  Surgeon: Sueanne Margarita, MD;  Location: [facility] ENDOSCOPY;  Service: Cardiovascular;  Laterality: N/A;   THYROID SURGERY     THYROIDECTOMY     left, due to goiter [place] Right 06/29/2020   Procedure: RIGHT TOTAL KNEE ARTHROPLASTY;  Surgeon: Garald Balding, MD;  Location: [facility];  Service: Orthopedics;  Laterality: Right;   Family History  Problem Relation Age of Onset   Alzheimer's disease Mother    Heart disease Mother        CHF   Arthritis Father    Heart disease Father        leaking mitral valve   Benign prostatic hyperplasia Father    Heart disease Brother        murmur   Hypertension  Neg Hx    Diabetes Neg Hx    Depression Neg Hx    Alcohol abuse Neg Hx    Drug abuse Neg Hx    Stroke Neg Hx    Cancer Neg Hx    Colon cancer Neg Hx    Esophageal cancer Neg Hx    Rectal cancer Neg Hx    Stomach cancer Neg Hx    Social History   Socioeconomic History   Marital status: Married    Spouse name: Not on file   Number of children: 2   Years of education: Not on file   Highest education level: Not on file  Occupational History   Occupation: Artist  Tobacco Use   Smoking status: Never   Smokeless tobacco: Never  Vaping Use   Vaping Use: Never used  Substance and Sexual Activity   Alcohol use: No   Drug use: No   Sexual activity: Yes  Other Topics Concern   Not on file  Social History Narrative   Widowed, wife deceased colon cancer 1996-08-06, remarried 2001, lives with remarried wife   Occupation: retired, worked for coffee Engineer, site   Activity: walking 30-76min 4-5d/wk   Diet: good water, fruits/vegetables daily   Social Determinants of Health   Financial Resource Strain: Low Risk  (06/08/2022)   Overall Financial Resource Strain (CARDIA)    Difficulty of Paying Living Expenses: Not hard at all  Food Insecurity: No Food Insecurity (06/08/2022)   Hunger Vital Sign    Worried About Running Out of Food in the Last Year: Never true    Ran Out of Food in the Last Year: Never true  Transportation Needs: No Transportation Needs (06/08/2022)   PRAPARE - Hydrologist (Medical): No    Lack of Transportation (Non-Medical): No  Physical Activity: Inactive (06/08/2022)   Exercise Vital Sign    Days of Exercise per Week: 0 days    Minutes of Exercise per Session: 0 min  Stress: No Stress Concern Present (06/08/2022)   [place]  of Stress : Not at all  Social Connections: Socially Integrated (06/08/2022)   Social Connection and Isolation Panel [NHANES]     Frequency of Communication with Friends and Family: More than three times a week    Frequency of Social Gatherings with Friends and Family: More than three times a week    Attends Religious Services: More than 4 times per year    Active Member of [employer] or Organizations: Yes    Attends Music therapist: More than 4 times per year    Marital Status: Married    Tobacco Counseling Counseling given: Not Answered   Clinical Intake:  Pre-visit preparation completed: Yes  Pain : No/denies pain     Nutritional Risks: None Diabetes: No  How often do you need to have someone help you when you read instructions, pamphlets, or other written materials from your doctor or pharmacy?: 1 - Never  Diabetic? no  Interpreter Needed?: No  Information entered by :: C.Ray Glacken LPN   Activities of Daily Living    06/08/2022   10:36 AM  In your present state of health, do you have any difficulty performing the following activities:  Hearing? 0  Vision? 0  Difficulty concentrating or making decisions? 0  Walking or climbing stairs? 0  Dressing or bathing? 0  Doing errands, shopping? 0  Preparing Food and eating ? N  Using the Toilet? N  In the past six months, have you accidently leaked urine? Y  Comment occasional  Do you have problems with loss of bowel control? N  Managing your Medications? N  Managing your Finances? N  Housekeeping or managing your Housekeeping? N    Patient Care Team: Ria Bush, MD as PCP - General (Family Medicine) Buford Dresser, MD as PCP - Cardiology (Cardiology)  Indicate any recent Medical Services you may have received from other than [facility] providers in the past year (date may be approximate).     Assessment:   This is a routine wellness examination for Alec Maldonado.  Hearing/Vision screen Hearing Screening - Comments:: No aid Vision Screening - Comments:: Readers - [place]  Dietary issues and exercise activities  discussed: Current Exercise Habits: The patient does not participate in regular exercise at present   Goals Addressed             This Visit's Progress    Patient Stated       To join gym.       Depression Screen    06/08/2022   10:35 AM 06/06/2021    2:01 PM 05/14/2020    1:09 PM 05/13/2019    9:36 AM 05/10/2018    2:31 PM 03/13/2017    9:33 AM 02/24/2016   11:45 AM  PHQ 2/9 Scores  PHQ - 2 Score 0 0 0 0 0 0 0  PHQ- 9 Score   0  0      Fall Risk    06/08/2022   10:31 AM 06/06/2021    2:05 PM 05/14/2020    1:09 PM 07/24/2019    3:43 PM 05/13/2019    9:36 AM  Fall Risk   Falls in the past year? 0 0 0 0 0  Number falls in past yr: 0 0 0    Injury with Fall? 0 0 0    Risk for fall due to : No Fall Risks No Fall Risks Medication side effect    Follow up Falls prevention discussed;Falls evaluation completed Falls prevention discussed Falls evaluation completed;Falls prevention discussed  FALL RISK PREVENTION PERTAINING TO THE HOME:  Any stairs in or around the home? Yes  If so, are there any without handrails? Yes  Home free of loose throw rugs in walkways, pet beds, electrical cords, etc? Yes  Adequate lighting in your home to reduce risk of falls? Yes   ASSISTIVE DEVICES UTILIZED TO PREVENT FALLS:  Life alert? No  Use of a cane, walker or w/c? No  Grab bars in the bathroom? Yes  Shower chair or bench in shower? Yes  Elevated toilet seat or a handicapped toilet? Yes    Cognitive Function:    05/14/2020    1:13 PM 05/10/2018    2:32 PM 02/24/2016   11:59 AM  MMSE - Mini Mental State Exam  Not completed: Refused    Orientation to time  5 5  Orientation to Place  5 5  Registration  3 3  Attention/ Calculation  0 0  Recall  3 3  Language- name 2 objects  0 0  Language- repeat  1 1  Language- follow 3 step command  3 3  Language- read & follow direction  0 0  Write a sentence  0 0  Copy design  0 0  Total score  20 20        06/08/2022   10:37 AM 06/06/2021     2:07 PM  6CIT Screen  What Year? 0 points 0 points  What month? 0 points 0 points  What time? 0 points 0 points  Count back from 20 0 points 0 points  Months in reverse 0 points 0 points  Repeat phrase 0 points 2 points  Total Score 0 points 2 points    Immunizations Immunization History  Administered Date(s) Administered   Influenza, High Dose Seasonal PF 02/19/2017, 12/21/2017, 12/22/2020   Influenza, Quadrivalent, Recombinant, Inj, Pf 11/30/2018   Influenza,inj,Quad PF,6+ Mos 02/24/2016   Influenza-Unspecified 12/16/2019   PFIZER(Purple Top)SARS-COV-2 Vaccination 04/20/2019, 05/14/2019, 12/05/2019   Pneumococcal Conjugate-13 02/23/2015   Pneumococcal Polysaccharide-23 02/24/2016   Td 02/25/2003   Tdap 08/28/2013, 02/04/2015   Zoster Recombinat (Shingrix) 04/26/2021    TDAP status: Up to date  Flu Vaccine status: Up to date  Pneumococcal vaccine status: Up to date  Covid-19 vaccine status: Declined, Education has been provided regarding the importance of this vaccine but patient still declined. Advised may receive this vaccine at local pharmacy or Health Dept.or vaccine clinic. Aware to provide a copy of the vaccination record if obtained from local pharmacy or Health Dept. Verbalized acceptance and understanding.  Qualifies for Shingles Vaccine? Yes   Zostavax completed No   Shingrix Completed?: Yes per pt  Screening Tests Health Maintenance  Topic Date Due   FOOT EXAM  Never done   OPHTHALMOLOGY EXAM  Never done   Zoster Vaccines- Shingrix (2 of 2) 06/21/2021   COVID-19 Vaccine (4 - 2023-24 season) 11/04/2021   HEMOGLOBIN A1C  12/06/2021   Diabetic kidney evaluation - eGFR measurement  06/07/2022   Diabetic kidney evaluation - Urine ACR  06/07/2022   INFLUENZA VACCINE  10/05/2022   Medicare Annual Wellness (AWV)  06/08/2023   DTaP/Tdap/Td (4 - Td or Tdap) 02/03/2025   COLONOSCOPY (Pts 45-80yrs Insurance coverage will need to be confirmed)  06/29/2026    Pneumonia Vaccine 71+ Years old  Completed   Hepatitis C Screening  Completed   HPV VACCINES  Aged Out    Health Maintenance  Health Maintenance Due  Topic Date Due   FOOT EXAM  Never done   OPHTHALMOLOGY EXAM  Never done   Zoster Vaccines- Shingrix (2 of 2) 06/21/2021   COVID-19 Vaccine (4 - 2023-24 season) 11/04/2021   HEMOGLOBIN A1C  12/06/2021   Diabetic kidney evaluation - eGFR measurement  06/07/2022   Diabetic kidney evaluation - Urine ACR  06/07/2022    Colorectal cancer screening: Type of screening: Colonoscopy. Completed 06/28/16. Repeat every 10 years  Lung Cancer Screening: (Low Dose CT Chest recommended if Age 65-80 years, 30 pack-year currently smoking OR have quit w/in 15years.) does not qualify.   Lung Cancer Screening Referral: no  Additional Screening:  Hepatitis C Screening: does qualify; Completed 02/16/15  Vision Screening: Recommended annual ophthalmology exams for early detection of glaucoma and other disorders of the eye. Is the patient up to date with their annual eye exam?  Yes  Who is the provider or what is the name of the office in which the patient attends annual eye exams? [facility] If pt is not established with a provider, would they like to be referred to a provider to establish care? No .   Dental Screening: Recommended annual dental exams for proper oral hygiene  Community Resource Referral / Chronic Care Management: CRR required this visit?  No   CCM required this visit?  No      Plan:     I have personally reviewed and noted the following in the patient's chart:   Medical and social history Use of alcohol, tobacco or illicit drugs  Current medications and supplements including opioid prescriptions. Patient is not currently taking opioid prescriptions. Functional ability and status Nutritional status Physical activity Advanced directives List of other physicians Hospitalizations, surgeries, and ER visits in  previous 12 months Vitals Screenings to include cognitive, depression, and falls Referrals and appointments  In addition, I have reviewed and discussed with patient certain preventive protocols, quality metrics, and best practice recommendations. A written personalized care plan for preventive services as well as general preventive health recommendations were provided to patient.     Lebron Conners, LPN   [id number]   Nurse Notes: none

## 2022-06-08 NOTE — Patient Instructions (Signed)
Alec Maldonado , Thank you for taking time to come for your Medicare Wellness Visit. I appreciate your ongoing commitment to your health goals. Please review the following plan we discussed and let me know if I can assist you in the future.   These are the goals we discussed:  Goals      Increase physical activity     Starting 05/10/2018 I will continue to walk 1-2 miles once weekly and to chop wood for 2 hours every other day.      Patient Stated     05/14/2020, I will maintain and continue medication as prescribed.      Patient Stated     To join gym.        This is a list of the screening recommended for you and due dates:  Health Maintenance  Topic Date Due   Complete foot exam   Never done   Eye exam for diabetics  Never done   Zoster (Shingles) Vaccine (2 of 2) 06/21/2021   COVID-19 Vaccine (4 - 2023-24 season) 11/04/2021   Hemoglobin A1C  12/06/2021   Yearly kidney function blood test for diabetes  06/07/2022   Yearly kidney health urinalysis for diabetes  06/07/2022   Flu Shot  10/05/2022   Medicare Annual Wellness Visit  06/08/2023   DTaP/Tdap/Td vaccine (4 - Td or Tdap) 02/03/2025   Colon Cancer Screening  06/29/2026   Pneumonia Vaccine  Completed   Hepatitis C Screening: USPSTF Recommendation to screen - Ages 18-73 yo.  Completed   HPV Vaccine  Aged Out    Advanced directives: Copy on file in the chart.  Conditions/risks identified: Aim for 30 minutes of exercise or brisk walking, 6-8 glasses of water, and 5 servings of fruits and vegetables each day.   Next appointment: Follow up in one year for your annual wellness visit. 06/11/23 @ 2:30 telephone visit.  Preventive Care 73 Years and Older, Male  Preventive care refers to lifestyle choices and visits with your health care provider that can promote health and wellness. What does preventive care include? A yearly physical exam. This is also called an annual well check. Dental exams once or twice a year. Routine eye  exams. Ask your health care provider how often you should have your eyes checked. Personal lifestyle choices, including: Daily care of your teeth and gums. Regular physical activity. Eating a healthy diet. Avoiding tobacco and drug use. Limiting alcohol use. Practicing safe sex. Taking low doses of aspirin every day. Taking vitamin and mineral supplements as recommended by your health care provider. What happens during an annual well check? The services and screenings done by your health care provider during your annual well check will depend on your age, overall health, lifestyle risk factors, and family history of disease. Counseling  Your health care provider may ask you questions about your: Alcohol use. Tobacco use. Drug use. Emotional well-being. Home and relationship well-being. Sexual activity. Eating habits. History of falls. Memory and ability to understand (cognition). Work and work Statistician. Screening  You may have the following tests or measurements: Height, weight, and BMI. Blood pressure. Lipid and cholesterol levels. These may be checked every 5 years, or more frequently if you are over 24 years old. Skin check. Lung cancer screening. You may have this screening every year starting at age 5 if you have a 30-pack-year history of smoking and currently smoke or have quit within the past 15 years. Fecal occult blood test (FOBT) of the stool. You  may have this test every year starting at age 45. Flexible sigmoidoscopy or colonoscopy. You may have a sigmoidoscopy every 5 years or a colonoscopy every 10 years starting at age 89. Prostate cancer screening. Recommendations will vary depending on your family history and other risks. Hepatitis C blood test. Hepatitis B blood test. Sexually transmitted disease (STD) testing. Diabetes screening. This is done by checking your blood sugar (glucose) after you have not eaten for a while (fasting). You may have this done every  1-3 years. Abdominal aortic aneurysm (AAA) screening. You may need this if you are a current or former smoker. Osteoporosis. You may be screened starting at age 52 if you are at high risk. Talk with your health care provider about your test results, treatment options, and if necessary, the need for more tests. Vaccines  Your health care provider may recommend certain vaccines, such as: Influenza vaccine. This is recommended every year. Tetanus, diphtheria, and acellular pertussis (Tdap, Td) vaccine. You may need a Td booster every 10 years. Zoster vaccine. You may need this after age 73. Pneumococcal 13-valent conjugate (PCV13) vaccine. One dose is recommended after age 73. Pneumococcal polysaccharide (PPSV23) vaccine. One dose is recommended after age 73. Talk to your health care provider about which screenings and vaccines you need and how often you need them. This information is not intended to replace advice given to you by your health care provider. Make sure you discuss any questions you have with your health care provider. Document Released: 03/19/2015 Document Revised: 11/10/2015 Document Reviewed: 12/22/2014 Elsevier Interactive Patient Education  2017 [place] Prevention in the Home Falls can cause injuries. They can happen to people of all ages. There are many things you can do to make your home safe and to help prevent falls. What can I do on the outside of my home? Regularly fix the edges of walkways and driveways and fix any cracks. Remove anything that might make you trip as you walk through a door, such as a raised step or threshold. Trim any bushes or trees on the path to your home. Use bright outdoor lighting. Clear any walking paths of anything that might make someone trip, such as rocks or tools. Regularly check to see if handrails are loose or broken. Make sure that both sides of any steps have handrails. Any raised decks and porches should have guardrails on  the edges. Have any leaves, snow, or ice cleared regularly. Use sand or salt on walking paths during winter. Clean up any spills in your garage right away. This includes oil or grease spills. What can I do in the bathroom? Use night lights. Install grab bars by the toilet and in the tub and shower. Do not use towel bars as grab bars. Use non-skid mats or decals in the tub or shower. If you need to sit down in the shower, use a plastic, non-slip stool. Keep the floor dry. Clean up any water that spills on the floor as soon as it happens. Remove soap buildup in the tub or shower regularly. Attach bath mats securely with double-sided non-slip rug tape. Do not have throw rugs and other things on the floor that can make you trip. What can I do in the bedroom? Use night lights. Make sure that you have a light by your bed that is easy to reach. Do not use any sheets or blankets that are too big for your bed. They should not hang down onto the floor. Have a  firm chair that has side arms. You can use this for support while you get dressed. Do not have throw rugs and other things on the floor that can make you trip. What can I do in the kitchen? Clean up any spills right away. Avoid walking on wet floors. Keep items that you use a lot in easy-to-reach places. If you need to reach something above you, use a strong step stool that has a grab bar. Keep electrical cords out of the way. Do not use floor polish or wax that makes floors slippery. If you must use wax, use non-skid floor wax. Do not have throw rugs and other things on the floor that can make you trip. What can I do with my stairs? Do not leave any items on the stairs. Make sure that there are handrails on both sides of the stairs and use them. Fix handrails that are broken or loose. Make sure that handrails are as long as the stairways. Check any carpeting to make sure that it is firmly attached to the stairs. Fix any carpet that is loose  or worn. Avoid having throw rugs at the top or bottom of the stairs. If you do have throw rugs, attach them to the floor with carpet tape. Make sure that you have a light switch at the top of the stairs and the bottom of the stairs. If you do not have them, ask someone to add them for you. What else can I do to help prevent falls? Wear shoes that: Do not have high heels. Have rubber bottoms. Are comfortable and fit you well. Are closed at the toe. Do not wear sandals. If you use a stepladder: Make sure that it is fully opened. Do not climb a closed stepladder. Make sure that both sides of the stepladder are locked into place. Ask someone to hold it for you, if possible. Clearly mark and make sure that you can see: Any grab bars or handrails. First and last steps. Where the edge of each step is. Use tools that help you move around (mobility aids) if they are needed. These include: Canes. Walkers. Scooters. Crutches. Turn on the lights when you go into a dark area. Replace any light bulbs as soon as they burn out. Set up your furniture so you have a clear path. Avoid moving your furniture around. If any of your floors are uneven, fix them. If there are any pets around you, be aware of where they are. Review your medicines with your doctor. Some medicines can make you feel dizzy. This can increase your chance of falling. Ask your doctor what other things that you can do to help prevent falls. This information is not intended to replace advice given to you by your health care provider. Make sure you discuss any questions you have with your health care provider. Document Released: 12/17/2008 Document Revised: 07/29/2015 Document Reviewed: 03/27/2014 Elsevier Interactive Patient Education  2017 [employer].

## 2022-06-19 ENCOUNTER — Ambulatory Visit (INDEPENDENT_AMBULATORY_CARE_PROVIDER_SITE_OTHER): Payer: Medicare Other | Admitting: Family Medicine

## 2022-06-19 ENCOUNTER — Encounter: Payer: Self-pay | Admitting: Family Medicine

## 2022-06-19 VITALS — BP 132/78 | HR 55 | Temp 97.3°F | Ht 69.75 in | Wt 239.4 lb

## 2022-06-19 DIAGNOSIS — L84 Corns and callosities: Secondary | ICD-10-CM | POA: Diagnosis not present

## 2022-06-19 DIAGNOSIS — Z8673 Personal history of transient ischemic attack (TIA), and cerebral infarction without residual deficits: Secondary | ICD-10-CM

## 2022-06-19 DIAGNOSIS — E669 Obesity, unspecified: Secondary | ICD-10-CM

## 2022-06-19 DIAGNOSIS — Z0000 Encounter for general adult medical examination without abnormal findings: Secondary | ICD-10-CM | POA: Diagnosis not present

## 2022-06-19 DIAGNOSIS — N3943 Post-void dribbling: Secondary | ICD-10-CM | POA: Diagnosis not present

## 2022-06-19 DIAGNOSIS — H3412 Central retinal artery occlusion, left eye: Secondary | ICD-10-CM

## 2022-06-19 DIAGNOSIS — R7303 Prediabetes: Secondary | ICD-10-CM

## 2022-06-19 DIAGNOSIS — E785 Hyperlipidemia, unspecified: Secondary | ICD-10-CM

## 2022-06-19 DIAGNOSIS — N401 Enlarged prostate with lower urinary tract symptoms: Secondary | ICD-10-CM | POA: Diagnosis not present

## 2022-06-19 DIAGNOSIS — Z7189 Other specified counseling: Secondary | ICD-10-CM | POA: Diagnosis not present

## 2022-06-19 LAB — CBC WITH DIFFERENTIAL/PLATELET
Basophils Absolute: 0 10*3/uL (ref 0.0–0.1)
Basophils Relative: 0.6 % (ref 0.0–3.0)
Eosinophils Absolute: 0.1 10*3/uL (ref 0.0–0.7)
Eosinophils Relative: 1.9 % (ref 0.0–5.0)
HCT: 39.4 % (ref 39.0–52.0)
Hemoglobin: 13.4 g/dL (ref 13.0–17.0)
Lymphocytes Relative: 24.8 % (ref 12.0–46.0)
Lymphs Abs: 1.8 10*3/uL (ref 0.7–4.0)
MCHC: 33.9 g/dL (ref 30.0–36.0)
MCV: 83.1 fl (ref 78.0–100.0)
Monocytes Absolute: 0.7 10*3/uL (ref 0.1–1.0)
Monocytes Relative: 9.1 % (ref 3.0–12.0)
Neutro Abs: 4.7 10*3/uL (ref 1.4–7.7)
Neutrophils Relative %: 63.6 % (ref 43.0–77.0)
Platelets: 267 10*3/uL (ref 150.0–400.0)
RBC: 4.74 Mil/uL (ref 4.22–5.81)
RDW: 15 % (ref 11.5–15.5)
WBC: 7.4 10*3/uL (ref 4.0–10.5)

## 2022-06-19 LAB — COMPREHENSIVE METABOLIC PANEL
ALT: 18 U/L (ref 0–53)
AST: 16 U/L (ref 0–37)
Albumin: 4.5 g/dL (ref 3.5–5.2)
Alkaline Phosphatase: 61 U/L (ref 39–117)
BUN: 14 mg/dL (ref 6–23)
CO2: 26 mEq/L (ref 19–32)
Calcium: 9.1 mg/dL (ref 8.4–10.5)
Chloride: 105 mEq/L (ref 96–112)
Creatinine, Ser: 1.08 mg/dL (ref 0.40–1.50)
GFR: 68.54 mL/min (ref >60.00)
Glucose, Bld: 101 mg/dL — ABNORMAL HIGH (ref 70–99)
Potassium: 4.1 mEq/L (ref 3.5–5.1)
Sodium: 141 mEq/L (ref 135–145)
Total Bilirubin: 0.8 mg/dL (ref 0.2–1.2)
Total Protein: 6.7 g/dL (ref 6.0–8.3)

## 2022-06-19 LAB — POC URINALSYSI DIPSTICK (AUTOMATED)
Bilirubin, UA: NEGATIVE
Blood, UA: NEGATIVE
Glucose, UA: NEGATIVE
Ketones, UA: NEGATIVE
Leukocytes, UA: NEGATIVE
Nitrite, UA: NEGATIVE
Protein, UA: NEGATIVE
Spec Grav, UA: 1.02 (ref 1.010–1.025)
Urobilinogen, UA: 0.2 E.U./dL
pH, UA: 5.5 (ref 5.0–8.0)

## 2022-06-19 LAB — LIPID PANEL
Cholesterol: 111 mg/dL (ref 0–200)
HDL: 36 mg/dL — ABNORMAL LOW (ref >39.00)
LDL Cholesterol: 56 mg/dL (ref 0–99)
NonHDL: 74.57
Total CHOL/HDL Ratio: 3
Triglycerides: 93 mg/dL (ref 0.0–149.0)
VLDL: 18.6 mg/dL (ref 0.0–40.0)

## 2022-06-19 LAB — PSA: PSA: 0.78 ng/mL (ref 0.10–4.00)

## 2022-06-19 LAB — HEMOGLOBIN A1C: Hgb A1c MFr Bld: 5.9 % (ref 4.6–6.5)

## 2022-06-19 MED ORDER — FINASTERIDE 5 MG PO TABS: 5.0000 mg | ORAL_TABLET | Freq: Every day | ORAL | 4 refills | Status: DC

## 2022-06-19 MED ORDER — EZETIMIBE 10 MG PO TABS: ORAL_TABLET | ORAL | 4 refills | Status: DC

## 2022-06-19 MED ORDER — TAMSULOSIN HCL 0.4 MG PO CAPS: 0.8000 mg | ORAL_CAPSULE | Freq: Every day | ORAL | 4 refills | Status: DC

## 2022-06-19 MED ORDER — SIMVASTATIN 80 MG PO TABS: 80.0000 mg | ORAL_TABLET | Freq: Every day | ORAL | 4 refills | Status: DC

## 2022-06-19 MED ORDER — CARVEDILOL 6.25 MG PO TABS: 6.2500 mg | ORAL_TABLET | Freq: Two times a day (BID) | ORAL | 4 refills | Status: DC

## 2022-06-19 NOTE — Assessment & Plan Note (Signed)
Chronic, on high dose simvastatin. Continue. The ASCVD Risk score (Arnett DK, et al., 2019) failed to calculate for the following reasons:   The patient has a prior MI or stroke diagnosis

## 2022-06-19 NOTE — Patient Instructions (Addendum)
Pass by lab for blood work today.  Urinalysis today  Next time you pass by pharmacy, get date of 2nd shingles shot to update your chart.  For urination, consider return to urology to discuss further treatment.  Left sole corn/wart pared down today. Use callus pad to tender area. If not better, let me know for podiatry referral.

## 2022-06-19 NOTE — Assessment & Plan Note (Signed)
Encourage healthy diet and lifestyle choices to affect sustainable weight loss.  

## 2022-06-19 NOTE — Assessment & Plan Note (Addendum)
Continue finasteride and flomax 0.8mg  daily. Update PSA.

## 2022-06-19 NOTE — Progress Notes (Signed)
Ph: [phone]       Fax: [phone]   Patient ID: Alec Maldonado, male    DOB: 08/31/1950, 72 y.o.   MRN: [id number]  This visit was conducted in person.  BP 132/78   Pulse (!) 55   Temp (!) 97.3 F (36.3 C) (Temporal)   Ht 5' 9.75" (1.772 m)   Wt 239 lb 6 oz (108.6 kg)   SpO2 95%   BMI 34.59 kg/m    CC: CPE Subjective:   HPI: Alec Maldonado is a 72 y.o. male presenting on 06/19/2022 for Annual Exam (MCR prt 2 [AWV- 06/08/22].)   Saw health advisor 2 wks ago for medicare wellness visit. Note reviewed.   No results found.  Flowsheet Row Clinical Support from 06/08/2022 in [facility] [place]  PHQ-2 Total Score 0          06/08/2022   10:31 AM 06/06/2021    2:05 PM 05/14/2020    1:09 PM 07/24/2019    3:43 PM 05/13/2019    9:36 AM  Fall Risk   Falls in the past year? 0 0 0 0 0  Number falls in past yr: 0 0 0    Injury with Fall? 0 0 0    Risk for fall due to : No Fall Risks No Fall Risks Medication side effect    Follow up Falls prevention discussed;Falls evaluation completed Falls prevention discussed Falls evaluation completed;Falls prevention discussed     R knee replacement ([facility]) - ongoing difficulty with this - pain with transitions, swelling of knee, numbness to toes and to ball of R foot. No lower back pain or shooting pain down leg.   L lateral sole tender with walking "like walking on a nail". He dropped piece of wood to the foot, pain started 1 month afterwards. This started around 03/2022.    S/p L central retinal artery CVA, cryptogenic as stroke workup unrevealing 06/2019, saw neurology (not recently) and cardiology. S/p LINQ loop recorder showing very brief afib 08-12-2020 - AC not started.   Preventative: COLONOSCOPY 06/2016 diverticulosis, rpt 10 yrs Christella Hartigan) Prostate screen - PSA yearly. Nocturia x2-3, post void dribbling.  Lung cancer screening - not eligible Flu yearly COVID vaccine Pfizer 04/2019, 05/2019, 12/2019 Tdap 2015  again 02/2015 Pneumovax 2017, prevnar-13 2016 RSV 12/2021 Shingrix - 04/2021, s/p 2nd as well Advanced directive discussion - scanned in chart 03/2017. Wife Eber Jones would be HCPOA, then son Clifton Custard and daughter Jennette Kettle. Would want HCPOA to make end of life decisions.  Seat belt use discussed Sunscreen use discussed. S/p BCC removed from back 2018 and ear 2023 - Dr Yetta Barre.  Non smoker  Alcohol - none  Dentist q6 mo  Eye exam yearly - sees retinologist - lost near complete vision in left eye. Bowel - no constipation Bladder - ongoing for 3 years, random postvoid dribbling, predominant trouble incomplete emptying - this improved after increasing flomax. Saw urologist 2021 [facility]).   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 Interim medical history since our last visit reviewed. Allergies and medications reviewed and updated. Outpatient Medications Prior to Visit  Medication Sig Dispense Refill   acetaminophen (TYLENOL) 500 MG tablet Take 500 mg by mouth every 6 (six)  hours as needed.     aspirin 81 MG EC tablet Take 1 tablet (81 mg total) by mouth daily. Swallow whole. 30 tablet 12   butalbital-acetaminophen-caffeine (FIORICET) 50-325-40 MG tablet Take 1 tablet by mouth 3 (three) times daily as needed for migraine. 20 tablet 0   Omega-3 Fatty Acids (FISH OIL) 1200 MG CAPS Take 1,200 mg by mouth daily.     carvedilol (COREG) 6.25 MG tablet Take 1 tablet (6.25 mg total) by mouth 2 (two) times daily with a meal. 180 tablet 3   ezetimibe (ZETIA) 10 MG tablet TAKE 1 TABLET(10 MG) BY MOUTH DAILY 90 tablet 3   finasteride (PROSCAR) 5 MG tablet Take 1 tablet (5 mg total) by mouth daily. 90 tablet 3   simvastatin (ZOCOR) 80  MG tablet Take 1 tablet (80 mg total) by mouth daily at 6 PM. 90 tablet 3   tamsulosin (FLOMAX) 0.4 MG CAPS capsule Take 2 capsules (0.8 mg total) by mouth at bedtime. 180 capsule 3   chlorpheniramine-HYDROcodone (TUSSIONEX PENNKINETIC ER) 10-8 MG/5ML Take 5 mLs by mouth at bedtime as needed for cough. (Patient not taking: Reported on 06/08/2022) 90 mL 0   predniSONE (DELTASONE) 20 MG tablet Take 2 tablets (40 mg total) by mouth daily. (Patient not taking: Reported on 06/08/2022) 10 tablet 0   No facility-administered medications prior to visit.     Per HPI unless specifically indicated in ROS section below Review of Systems  Constitutional:  Negative for activity change, appetite change, chills, fatigue, fever and unexpected weight change.  HENT:  Negative for hearing loss.   Eyes:  Negative for visual disturbance.  Respiratory:  Negative for cough, chest tightness, shortness of breath and wheezing.   Cardiovascular:  Negative for chest pain, palpitations and leg swelling.  Gastrointestinal:  Negative for abdominal distention, abdominal pain, blood in stool, constipation, diarrhea, nausea and vomiting.  Genitourinary:  Negative for difficulty urinating and hematuria.  Musculoskeletal:  Negative for arthralgias, myalgias and neck pain.  Skin:  Negative for rash.  Neurological:  Negative for dizziness, seizures, syncope and headaches.  Hematological:  Negative for adenopathy. Does not bruise/bleed easily.  Psychiatric/Behavioral:  Negative for dysphoric mood. The patient is not nervous/anxious.     Objective:  BP 132/78   Pulse (!) 55   Temp (!) 97.3 F (36.3 C) (Temporal)   Ht 5' 9.75" (1.772 m)   Wt 239 lb 6 oz (108.6 kg)   SpO2 95%   BMI 34.59 kg/m   Wt Readings from Last 3 Encounters:  06/19/22 239 lb 6 oz (108.6 kg)  06/08/22 250 lb (113.4 kg)  03/10/22 241 lb 12.8 oz (109.7 kg)      Physical Exam Vitals and nursing note reviewed.  Constitutional:      General: He is not in  acute distress.    Appearance: Normal appearance. He is well-developed. He is not ill-appearing.  HENT:     Head: Normocephalic and atraumatic.     Right Ear: Hearing, tympanic membrane, ear canal and external ear normal.     Left Ear: Hearing, tympanic membrane, ear canal and external ear normal.  Eyes:     General: No scleral icterus.    Extraocular Movements: Extraocular movements intact.     Conjunctiva/sclera: Conjunctivae normal.     Pupils: Pupils are equal, round, and reactive to light.  Neck:     Thyroid: No thyroid mass or thyromegaly.  Cardiovascular:     Rate and Rhythm: Normal rate and regular rhythm.  Pulses: Normal pulses.          Radial pulses are 2+ on the right side and 2+ on the left side.     Heart sounds: No murmur heard.    Comments: Split S2 Pulmonary:     Effort: Pulmonary effort is normal. No respiratory distress.     Breath sounds: Normal breath sounds. No wheezing, rhonchi or rales.  Abdominal:     General: Bowel sounds are normal. There is no distension.     Palpations: Abdomen is soft. There is no mass.     Tenderness: There is no abdominal tenderness. There is no guarding or rebound.     Hernia: No hernia is present.  Musculoskeletal:        General: Normal range of motion.     Cervical back: Normal range of motion and neck supple.     Right lower leg: No edema.     Left lower leg: No edema.  Lymphadenopathy:     Cervical: No cervical adenopathy.  Skin:    General: Skin is warm and dry.     Findings: No rash.     Comments: Thickened tender hyperkeratotic skin to left lateral sole without erythema or drainage   Neurological:     General: No focal deficit present.     Mental Status: He is alert and oriented to person, place, and time.  Psychiatric:        Mood and Affect: Mood normal.        Behavior: Behavior normal.        Thought Content: Thought content normal.        Judgment: Judgment normal.       Diagnosis: clavus - Location:  left lateral sole of foot Procedure: Paring of lesion Informed consent:  Discussed the risks (infection, pain, bleeding, bruising, and recurrence of the lesion) and the benefits of the procedure, as well as the alternatives.  Informed consent was obtained. Anesthesia: none Alcohol pad used to clean area. A sharp blade was used to remove hyperkeratotic portion of the lesion. The patient tolerated the procedure well.   Assessment & Plan:   Problem List Items Addressed This Visit     Health maintenance examination - Primary (Chronic)    Preventative protocols reviewed and updated unless pt declined. Discussed healthy diet and lifestyle.       Relevant Orders   POCT Urinalysis Dipstick (Automated)   Advanced care planning/counseling discussion (Chronic)    Previously discussed.       Dyslipidemia    Chronic, on high dose simvastatin. Continue. The ASCVD Risk score (Arnett DK, et al., 2019) failed to calculate for the following reasons:   The patient has a prior MI or stroke diagnosis       Relevant Medications   ezetimibe (ZETIA) 10 MG tablet   simvastatin (ZOCOR) 80 MG tablet   Other Relevant Orders   Lipid panel   Comprehensive metabolic panel   Benign prostatic hyperplasia with lower urinary tract symptoms    Continue finasteride and flomax 0.8mg  daily. Update PSA.       Relevant Medications   finasteride (PROSCAR) 5 MG tablet   tamsulosin (FLOMAX) 0.4 MG CAPS capsule   Other Relevant Orders   PSA   CBC with Differential/Platelet   Central retinal artery occlusion of left eye    Cryptogenic stroke. Residual L eye vision loss.       Relevant Medications   carvedilol (COREG) 6.25 MG tablet   ezetimibe (ZETIA)  10 MG tablet   simvastatin (ZOCOR) 80 MG tablet   History 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    Continue aspirin, statin.       Obesity, Class I, BMI 30-34.9    Encourage healthy diet and lifestyle choices to  affect sustainable weight loss.       Prediabetes    Update A1c.       Relevant Orders   Hemoglobin A1c   Corns and callus    Corn vs plantar wart.  S/p paring of left foot without pinpoint bleed. No black core noted.  Pt tolerated well.  Discussed callus pad use.  To let me know if desires podiatry referral.         Meds ordered this encounter  Medications   carvedilol (COREG) 6.25 MG tablet    Sig: Take 1 tablet (6.25 mg total) by mouth 2 (two) times daily with a meal.    Dispense:  180 tablet    Refill:  4   ezetimibe (ZETIA) 10 MG tablet    Sig: TAKE 1 TABLET(10 MG) BY MOUTH DAILY    Dispense:  90 tablet    Refill:  4   finasteride (PROSCAR) 5 MG tablet    Sig: Take 1 tablet (5 mg total) by mouth daily.    Dispense:  90 tablet    Refill:  4   simvastatin (ZOCOR) 80 MG tablet    Sig: Take 1 tablet (80 mg total) by mouth daily at 6 PM.    Dispense:  90 tablet    Refill:  4   tamsulosin (FLOMAX) 0.4 MG CAPS capsule    Sig: Take 2 capsules (0.8 mg total) by mouth at bedtime.    Dispense:  180 capsule    Refill:  4    Orders Placed This Encounter  Procedures   Lipid panel   Comprehensive metabolic panel   Hemoglobin A1c   PSA   CBC with Differential/Platelet   POCT Urinalysis Dipstick (Automated)    Patient Instructions  Pass by lab for blood work today.  Urinalysis today  Next time you pass by pharmacy, get date of 2nd shingles shot to update your chart.  For urination, consider return to urology to discuss further treatment.  Left sole corn/wart pared down today. Use callus pad to tender area. If not better, let me know for podiatry referral.   Follow up plan: Return in about 1 year (around 06/19/2023) for annual exam, prior fasting for blood work.  Eustaquio Boyden, MD

## 2022-06-19 NOTE — Assessment & Plan Note (Signed)
Cryptogenic stroke. Residual L eye vision loss.

## 2022-06-19 NOTE — Assessment & Plan Note (Signed)
Previously discussed.

## 2022-06-19 NOTE — Assessment & Plan Note (Addendum)
Corn vs plantar wart.  S/p paring of left foot without pinpoint bleed. No black core noted.  Pt tolerated well.  Discussed callus pad use.  To let me know if desires podiatry referral.

## 2022-06-19 NOTE — Assessment & Plan Note (Signed)
Preventative protocols reviewed and updated unless pt declined. Discussed healthy diet and lifestyle.  

## 2022-06-19 NOTE — Assessment & Plan Note (Signed)
Update A1c ?

## 2022-06-19 NOTE — Assessment & Plan Note (Signed)
Continue aspirin, statin.  

## 2022-06-23 ENCOUNTER — Ambulatory Visit (INDEPENDENT_AMBULATORY_CARE_PROVIDER_SITE_OTHER): Payer: Medicare Other | Admitting: Family Medicine

## 2022-06-23 ENCOUNTER — Encounter: Payer: Self-pay | Admitting: Family Medicine

## 2022-06-23 VITALS — BP 158/84 | HR 80 | Temp 98.3°F | Ht 69.75 in | Wt 241.0 lb

## 2022-06-23 DIAGNOSIS — Z9889 Other specified postprocedural states: Secondary | ICD-10-CM

## 2022-06-23 DIAGNOSIS — R059 Cough, unspecified: Secondary | ICD-10-CM

## 2022-06-23 DIAGNOSIS — J029 Acute pharyngitis, unspecified: Secondary | ICD-10-CM

## 2022-06-23 DIAGNOSIS — J22 Unspecified acute lower respiratory infection: Secondary | ICD-10-CM | POA: Diagnosis not present

## 2022-06-23 LAB — POCT RAPID STREP A (OFFICE): Rapid Strep A Screen: NEGATIVE

## 2022-06-23 LAB — POC COVID19 BINAXNOW: SARS Coronavirus 2 Ag: NEGATIVE

## 2022-06-23 LAB — POCT INFLUENZA A/B
Influenza A, POC: NEGATIVE
Influenza B, POC: NEGATIVE

## 2022-06-23 MED ORDER — AZITHROMYCIN 250 MG PO TABS: ORAL_TABLET | ORAL | 0 refills | Status: AC

## 2022-06-23 MED ORDER — PREDNISONE 20 MG PO TABS
ORAL_TABLET | ORAL | 0 refills | Status: DC
Start: 2022-06-23 — End: 2023-02-23

## 2022-06-23 MED ORDER — HYDROCOD POLI-CHLORPHE POLI ER 10-8 MG/5ML PO SUER: 5.0000 mL | Freq: Two times a day (BID) | ORAL | 0 refills | Status: DC | PRN

## 2022-06-23 NOTE — Assessment & Plan Note (Signed)
Exam consistent with acute bronchitis and possible sinusitis.  Given degree of cough, and comorbidities, did recommend aggressive treatment with zpack to cover atypical infection. Offered CXR - pt declined Rx prednisone taper as well as tussionex cough syrup.  Offered return for spirometry given h/o recurrent bronchitis this time of year. Denies h/o asthma, allergic rhinitis, COPD or significant smoke exposure.

## 2022-06-23 NOTE — Progress Notes (Signed)
Ph: [phone]       Fax: [phone]   Patient ID: Alec Maldonado, male    DOB: 27-Dec-1950, 72 y.o.   MRN: [id number]  This visit was conducted in person.  BP (!) 158/84   Pulse 80   Temp 98.3 F (36.8 C) (Temporal)   Ht 5' 9.75" (1.772 m)   Wt 241 lb (109.3 kg)   SpO2 96%   BMI 34.83 kg/m   BP Readings from Last 3 Encounters:  06/23/22 (!) 158/84  06/19/22 132/78  03/10/22 118/72   CC: cough Subjective:   HPI: Alec Maldonado is a 72 y.o. male presenting on 06/23/2022 for Cough (C/o fever- max 100.3, cough, ST and SOB. Sxs started 06/19/22.)   4d h/o cough with dyspnea. Fever/chills.  Tmax 100.3, productive cough of clear mucous, dyspnea, HA. Post-tussive emesis, sweating.   No ear or tooth pain, abd pain, diarrhea, nausea.   No sick contacts at home.  No h/o asthma.  No h/o seasonal allergies.  Non smoker.      Relevant past medical, surgical, family and social history reviewed and updated as indicated. Interim medical history since our last visit reviewed. Allergies and medications reviewed and updated. Outpatient Medications Prior to Visit  Medication Sig Dispense Refill   acetaminophen (TYLENOL) 500 MG tablet Take 500 mg by mouth every 6 (six) hours as needed.     aspirin 81 MG EC tablet Take 1 tablet (81 mg total) by mouth daily. Swallow whole. 30 tablet 12   butalbital-acetaminophen-caffeine (FIORICET) 50-325-40 MG tablet Take 1 tablet by mouth 3 (three) times daily as needed for migraine. 20 tablet 0   carvedilol (COREG) 6.25 MG tablet Take 1 tablet (6.25 mg total) by mouth 2 (two) times daily with a meal. 180 tablet 4   ezetimibe (ZETIA) 10 MG tablet TAKE 1 TABLET(10 MG) BY MOUTH DAILY 90 tablet 4   finasteride (PROSCAR) 5 MG tablet Take 1 tablet (5 mg total) by mouth daily. 90 tablet 4   Omega-3 Fatty Acids (FISH OIL) 1200 MG CAPS Take 1,200 mg by mouth daily.     simvastatin (ZOCOR) 80 MG tablet Take 1 tablet (80 mg total) by mouth daily at 6 PM. 90 tablet  4   tamsulosin (FLOMAX) 0.4 MG CAPS capsule Take 2 capsules (0.8 mg total) by mouth at bedtime. 180 capsule 4   No facility-administered medications prior to visit.     Per HPI unless specifically indicated in ROS section below Review of Systems  Objective:  BP (!) 158/84   Pulse 80   Temp 98.3 F (36.8 C) (Temporal)   Ht 5' 9.75" (1.772 m)   Wt 241 lb (109.3 kg)   SpO2 96%   BMI 34.83 kg/m   Wt Readings from Last 3 Encounters:  06/23/22 241 lb (109.3 kg)  06/19/22 239 lb 6 oz (108.6 kg)  06/08/22 250 lb (113.4 kg)      Physical Exam Vitals and nursing note reviewed.  Constitutional:      Appearance: Normal appearance. He is not ill-appearing.  HENT:     Head: Normocephalic and atraumatic.     Right Ear: Hearing, tympanic membrane, ear canal and external ear normal. There is no impacted cerumen.     Left Ear: Hearing, tympanic membrane, ear canal and external ear normal. There is no impacted cerumen.     Nose: Congestion present. No mucosal edema.     Right Turbinates: Not enlarged or swollen.  Left Turbinates: Not enlarged or swollen.     Right Sinus: No maxillary sinus tenderness or frontal sinus tenderness.     Left Sinus: No maxillary sinus tenderness or frontal sinus tenderness.     Mouth/Throat:     Mouth: Mucous membranes are moist.     Pharynx: Oropharynx is clear. No oropharyngeal exudate or posterior oropharyngeal erythema.  Eyes:     Extraocular Movements: Extraocular movements intact.     Conjunctiva/sclera: Conjunctivae normal.     Pupils: Pupils are equal, round, and reactive to light.  Cardiovascular:     Rate and Rhythm: Normal rate and regular rhythm.     Pulses: Normal pulses.     Heart sounds: Normal heart sounds. No murmur heard. Pulmonary:     Effort: Pulmonary effort is normal. No respiratory distress.     Breath sounds: No wheezing, rhonchi or rales.     Comments: Lungs overall clear but harsh persistent cough present with coughing  fits Musculoskeletal:     Cervical back: Normal range of motion and neck supple. No rigidity.     Right lower leg: No edema.     Left lower leg: No edema.  Lymphadenopathy:     Cervical: No cervical adenopathy.  Skin:    General: Skin is warm and dry.     Findings: No rash.  Neurological:     Mental Status: He is alert.  Psychiatric:        Mood and Affect: Mood normal.        Behavior: Behavior normal.       Results for orders placed or performed in visit on 06/23/22  POCT Influenza A/B  Result Value Ref Range   Influenza A, POC Negative Negative   Influenza B, POC Negative Negative  POCT rapid strep A  Result Value Ref Range   Rapid Strep A Screen Negative Negative  POC COVID-19 BinaxNow  Result Value Ref Range   SARS Coronavirus 2 Ag Negative Negative    Assessment & Plan:   Problem List Items Addressed This Visit     Status post mitral valve repair   Acute respiratory infection - Primary    Exam consistent with acute bronchitis and possible sinusitis.  Given degree of cough, and comorbidities, did recommend aggressive treatment with zpack to cover atypical infection. Offered CXR - pt declined Rx prednisone taper as well as tussionex cough syrup.  Offered return for spirometry given h/o recurrent bronchitis this time of year. Denies h/o asthma, allergic rhinitis, COPD or significant smoke exposure.       Relevant Medications   azithromycin (ZITHROMAX) 250 MG tablet   Other Relevant Orders   POCT Influenza A/B (Completed)   POCT rapid strep A (Completed)   POC COVID-19 BinaxNow (Completed)   Other Visit Diagnoses     Sore throat       Relevant Orders   POCT rapid strep A (Completed)   Cough, unspecified type       Relevant Orders   POC COVID-19 BinaxNow (Completed)        Meds ordered this encounter  Medications   azithromycin (ZITHROMAX) 250 MG tablet    Sig: Take 2 tablets on day 1, then 1 tablet daily on days 2 through 5    Dispense:  6 tablet     Refill:  0   DISCONTD: chlorpheniramine-HYDROcodone (TUSSIONEX) 10-8 MG/5ML    Sig: Take 5 mLs by mouth every 12 (twelve) hours as needed for cough.    Dispense:  120 mL    Refill:  0   predniSONE (DELTASONE) 20 MG tablet    Sig: Take two tablets daily for 4 days followed by one tablet daily for 4 days    Dispense:  12 tablet    Refill:  0   chlorpheniramine-HYDROcodone (TUSSIONEX) 10-8 MG/5ML    Sig: Take 5 mLs by mouth every 12 (twelve) hours as needed for cough.    Dispense:  120 mL    Refill:  0    Orders Placed This Encounter  Procedures   POCT Influenza A/B   POCT rapid strep A   POC COVID-19 BinaxNow    Order Specific Question:   Previously tested for COVID-19    Answer:   No    Order Specific Question:   Resident in a congregate (group) care setting    Answer:   No    Order Specific Question:   Employed in healthcare setting    Answer:   No    Patient Instructions  Treat for bronchitis and possible sinusitis with tussionex cough syrup, azithromycin antibiotic, and prednisone course.  Get plenty of rest. Let us know if fever >101, worsening productive or worsening shortness of breath for further evaluation.  Consider returning for breathing test once fully better.   Follow up plan: Return if symptoms worsen or fail to improve.  Eustaquio Boyden, MD

## 2022-06-23 NOTE — Patient Instructions (Addendum)
Treat for bronchitis and possible sinusitis with tussionex cough syrup, azithromycin antibiotic, and prednisone course.  Get plenty of rest. Let us know if fever >101, worsening productive or worsening shortness of breath for further evaluation.  Consider returning for breathing test once fully better.

## 2022-06-29 LAB — CUP PACEART REMOTE DEVICE CHECK
Date Time Interrogation Session: 20240424230140
Implantable Pulse Generator Implant Date: 20210414

## 2022-07-03 ENCOUNTER — Ambulatory Visit (INDEPENDENT_AMBULATORY_CARE_PROVIDER_SITE_OTHER): Payer: Medicare Other

## 2022-07-03 DIAGNOSIS — I63449 Cerebral infarction due to embolism of unspecified cerebellar artery: Secondary | ICD-10-CM

## 2022-07-10 NOTE — Progress Notes (Signed)
[facility] Summary Report / Loop Recorder 

## 2022-07-20 ENCOUNTER — Telehealth: Payer: Self-pay | Admitting: Family Medicine

## 2022-07-20 MED ORDER — HYDROCOD POLI-CHLORPHE POLI ER 10-8 MG/5ML PO SUER: 5.0000 mL | Freq: Two times a day (BID) | ORAL | 0 refills | Status: DC | PRN

## 2022-07-20 MED ORDER — ALBUTEROL SULFATE HFA 108 (90 BASE) MCG/ACT IN AERS: 2.0000 | INHALATION_SPRAY | Freq: Four times a day (QID) | RESPIRATORY_TRACT | 1 refills | Status: DC | PRN

## 2022-07-20 NOTE — Addendum Note (Signed)
Addended by: Eustaquio Boyden on: 07/20/2022 10:36 AM   Modules accepted: Orders

## 2022-07-20 NOTE — Telephone Encounter (Signed)
Spoke with pt about cough. States cough is not prod and denies any fever. Says he cough is just hanging on and he has maybe a little bit of wheezing. Pt agrees to refill of cough syrup and trying inhaler. Asks rxs be sent to [facility]. Also, I encouraged pt to call for OV if not any better after this round of meds. Pt verbalizes understanding.

## 2022-07-20 NOTE — Telephone Encounter (Signed)
Patient called in and stated that he was seen a few weeks ago for a cough. He stated that he is still experiencing a cough and was wanting to know if he could send something else in for him. He stated that he is leaving out of the [place] on Monday. Please advise. Thank you!

## 2022-07-20 NOTE — Telephone Encounter (Signed)
Seen almost 1 month ago.  Is cough productive, any fever, or just lingering nagging cough after prior infection?  Will refill tussinex cough syrup.  If wheezing/short of breath, would offer inhaler

## 2022-07-20 NOTE — Telephone Encounter (Signed)
Tussionex and albuterol inhaler sent to pharmacy.

## 2022-07-20 NOTE — Addendum Note (Signed)
Addended by: Eustaquio Boyden on: 07/20/2022 12:18 PM   Modules accepted: Orders

## 2022-08-04 NOTE — Progress Notes (Signed)
[facility] Summary Report / Loop Recorder 

## 2022-08-07 ENCOUNTER — Ambulatory Visit (INDEPENDENT_AMBULATORY_CARE_PROVIDER_SITE_OTHER): Payer: Medicare Other

## 2022-08-07 DIAGNOSIS — I63449 Cerebral infarction due to embolism of unspecified cerebellar artery: Secondary | ICD-10-CM | POA: Diagnosis not present

## 2022-08-07 LAB — CUP PACEART REMOTE DEVICE CHECK
Date Time Interrogation Session: 20240602230107
Implantable Pulse Generator Implant Date: 20210414

## 2022-08-29 NOTE — Progress Notes (Signed)
[facility] Summary Report / Loop Recorder 

## 2022-09-11 ENCOUNTER — Ambulatory Visit (INDEPENDENT_AMBULATORY_CARE_PROVIDER_SITE_OTHER): Payer: Medicare Other

## 2022-09-11 DIAGNOSIS — I63449 Cerebral infarction due to embolism of unspecified cerebellar artery: Secondary | ICD-10-CM | POA: Diagnosis not present

## 2022-09-11 LAB — CUP PACEART REMOTE DEVICE CHECK
Date Time Interrogation Session: 20240705230044
Implantable Pulse Generator Implant Date: 20210414

## 2022-09-20 DIAGNOSIS — D485 Neoplasm of uncertain behavior of skin: Secondary | ICD-10-CM | POA: Diagnosis not present

## 2022-09-20 DIAGNOSIS — D225 Melanocytic nevi of trunk: Secondary | ICD-10-CM | POA: Diagnosis not present

## 2022-09-20 DIAGNOSIS — L570 Actinic keratosis: Secondary | ICD-10-CM | POA: Diagnosis not present

## 2022-09-20 DIAGNOSIS — C44319 Basal cell carcinoma of skin of other parts of face: Secondary | ICD-10-CM | POA: Diagnosis not present

## 2022-09-20 DIAGNOSIS — Z85828 Personal history of other malignant neoplasm of skin: Secondary | ICD-10-CM | POA: Diagnosis not present

## 2022-09-20 DIAGNOSIS — L821 Other seborrheic keratosis: Secondary | ICD-10-CM | POA: Diagnosis not present

## 2022-09-28 NOTE — Progress Notes (Signed)
[facility] Summary Report / Loop Recorder 

## 2022-10-13 ENCOUNTER — Telehealth: Payer: Self-pay | Admitting: Family Medicine

## 2022-10-13 ENCOUNTER — Telehealth: Payer: Medicare Other | Admitting: Nurse Practitioner

## 2022-10-13 ENCOUNTER — Encounter: Payer: Self-pay | Admitting: Nurse Practitioner

## 2022-10-13 VITALS — Temp 99.7°F | Ht 69.75 in | Wt 240.0 lb

## 2022-10-13 DIAGNOSIS — U071 COVID-19: Secondary | ICD-10-CM

## 2022-10-13 DIAGNOSIS — R051 Acute cough: Secondary | ICD-10-CM

## 2022-10-13 HISTORY — DX: COVID-19: U07.1

## 2022-10-13 MED ORDER — NIRMATRELVIR/RITONAVIR (PAXLOVID)TABLET: 3.0000 | ORAL_TABLET | Freq: Two times a day (BID) | ORAL | 0 refills | Status: AC

## 2022-10-13 MED ORDER — HYDROCOD POLI-CHLORPHE POLI ER 10-8 MG/5ML PO SUER: 5.0000 mL | Freq: Two times a day (BID) | ORAL | 0 refills | Status: DC | PRN

## 2022-10-13 MED ORDER — NIRMATRELVIR/RITONAVIR (PAXLOVID)TABLET
3.0000 | ORAL_TABLET | Freq: Two times a day (BID) | ORAL | 0 refills | Status: DC
Start: 2022-10-13 — End: 2022-10-13

## 2022-10-13 NOTE — Progress Notes (Signed)
Ph: [phone] Fax: [phone]   Patient ID: Alec Maldonado, male    DOB: March 08, 1950, 72 y.o.   MRN: [id number]  Virtual visit completed through MyChart, a video enabled telemedicine application. Due to national recommendations of social distancing due to COVID-19, a virtual visit is felt to be most appropriate for this patient at this time. Reviewed limitations, risks, security and privacy concerns of performing a virtual visit and the availability of in person appointments. I also reviewed that there may be a patient responsible charge related to this service. The patient agreed to proceed.   Patient location: home Provider location: [place] at [facility], office Persons participating in this virtual visit: patient, provider   If any vitals were documented, they were collected by patient at home unless specified below.    Temp 99.7 F (37.6 C)   Ht 5' 9.75" (1.772 m)   Wt 240 lb (108.9 kg)   BMI 34.68 kg/m    CC: covid Subjective:   HPI: Alec Maldonado is a 72 y.o. male presenting on 10/13/2022 for Covid Positive (C/o fever, chills, runny nose, cough and ST. Sxs started 10/12/22. Pos home Covid test- 10/12/22. )  Symptoms started on/ 10/12/2022 Covid positive on 10/12/2022 Covid vaccines: 2 and a booster States that his wife has covid  Tylenol for the fever and headache     Relevant past medical, surgical, family and social history reviewed and updated as indicated. Interim medical history since our last visit reviewed. Allergies and medications reviewed and updated. Outpatient Medications Prior to Visit  Medication Sig Dispense Refill   acetaminophen (TYLENOL) 500 MG tablet Take 500 mg by mouth every 6 (six) hours as needed.     albuterol (VENTOLIN HFA) 108 (90 Base) MCG/ACT inhaler Inhale 2 puffs into the lungs every 6 (six) hours as needed for wheezing or shortness of breath. 8 g 1   aspirin 81 MG EC tablet Take 1 tablet (81 mg total) by mouth daily. Swallow whole. 30  tablet 12   butalbital-acetaminophen-caffeine (FIORICET) 50-325-40 MG tablet Take 1 tablet by mouth 3 (three) times daily as needed for migraine. 20 tablet 0   carvedilol (COREG) 6.25 MG tablet Take 1 tablet (6.25 mg total) by mouth 2 (two) times daily with a meal. 180 tablet 4   ezetimibe (ZETIA) 10 MG tablet TAKE 1 TABLET(10 MG) BY MOUTH DAILY 90 tablet 4   finasteride (PROSCAR) 5 MG tablet Take 1 tablet (5 mg total) by mouth daily. 90 tablet 4   Omega-3 Fatty Acids (FISH OIL) 1200 MG CAPS Take 1,200 mg by mouth daily.     predniSONE (DELTASONE) 20 MG tablet Take two tablets daily for 4 days followed by one tablet daily for 4 days 12 tablet 0   simvastatin (ZOCOR) 80 MG tablet Take 1 tablet (80 mg total) by mouth daily at 6 PM. 90 tablet 4   tamsulosin (FLOMAX) 0.4 MG CAPS capsule Take 2 capsules (0.8 mg total) by mouth at bedtime. 180 capsule 4   chlorpheniramine-HYDROcodone (TUSSIONEX) 10-8 MG/5ML Take 5 mLs by mouth every 12 (twelve) hours as needed for cough. 120 mL 0   No facility-administered medications prior to visit.     Per HPI unless specifically indicated in ROS section below Review of Systems  Constitutional:  Positive for fatigue and fever. Negative for appetite change and chills.  HENT:  Positive for ear pain and sore throat. Negative for ear discharge.   Respiratory:  Positive for cough. Negative for shortness  of breath.   Gastrointestinal:  Negative for abdominal pain, constipation, diarrhea and nausea.  Musculoskeletal:  Positive for myalgias.  Neurological:  Positive for headaches.   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  Given age comorbidities as discussed antiviral treatment will like to use Paxlovid.  Patient will hold statin for 2 weeks did review CDC guidelines in regards to self-isolation/quarantine.  Did review signs symptoms when to be seen urgent or emergently.  Orders: -     nirmatrelvir/ritonavir; Take 3 tablets by mouth 2 (two) times daily for 5 days. (Take nirmatrelvir 150 mg two tablets twice daily for 5 days and ritonavir 100 mg one tablet twice daily for 5 days) Patient GFR is 68 hold simvastatin for 2 weeks  Dispense: 30 tablet; Refill: 0  Acute cough Assessment & Plan: Tussionex prescription sent in.  Patient has uses medication quite frequently in his past.  Sedation precautions reviewed  Orders: -     Hydrocod Poli-Chlorphe Poli ER; Take 5 mLs by mouth every 12 (twelve) hours as needed for cough.  Dispense: 120 mL; Refill: 0     I discussed the assessment and treatment plan with the patient. The patient was provided an opportunity to ask questions and all were answered. The patient agreed with the plan and demonstrated an understanding of the instructions. The patient was advised to call back or seek an in-person evaluation if the symptoms worsen or if the condition fails to improve as anticipated.  Follow up plan: Return if symptoms worsen or fail to improve.  Audria Nine, NP

## 2022-10-13 NOTE — Telephone Encounter (Signed)
Medication sent in to [facility] 

## 2022-10-13 NOTE — Telephone Encounter (Signed)
Pt called stating [place] pharmacy doesn't have the meds, nirmatrelvir/ritonavir (PAXLOVID) 20 x 150 MG & 10 x 100MG  TABS [[id number]] in stock. Pt requested for rx to be sent to [id number] - [place],  - [address]? Call back # [phone]

## 2022-10-13 NOTE — Assessment & Plan Note (Signed)
Tussionex prescription sent in.  Patient has uses medication quite frequently in his past.  Sedation precautions reviewed

## 2022-10-13 NOTE — Assessment & Plan Note (Signed)
Positive home test.  Given age comorbidities as discussed antiviral treatment will like to use Paxlovid.  Patient will hold statin for 2 weeks did review CDC guidelines in regards to self-isolation/quarantine.  Did review signs symptoms when to be seen urgent or emergently.

## 2022-10-13 NOTE — Telephone Encounter (Signed)
Spoke with pt scheduling MyChart video visit with Matt at 10:00. Sxs started yesterday and pos home test yesterday.

## 2022-10-13 NOTE — Patient Instructions (Signed)
Nice to see you today I have sent in an antiviral medication and cough syrup. The cough syrup can make you sleepy. STOP taking the simvastatin for 2 weeks then start back there after

## 2022-10-13 NOTE — Telephone Encounter (Signed)
Patient called the office stating he has tested positive for covid yesterday. Patient wanted to know if Dr. Reece Maldonado could send in some paxolvid for him to take? He has cough, congestion, headache and fever. Please advise patient if needed at mobile number

## 2022-10-16 ENCOUNTER — Ambulatory Visit (INDEPENDENT_AMBULATORY_CARE_PROVIDER_SITE_OTHER): Payer: Medicare Other

## 2022-10-16 DIAGNOSIS — I63449 Cerebral infarction due to embolism of unspecified cerebellar artery: Secondary | ICD-10-CM

## 2022-10-31 NOTE — Progress Notes (Signed)
[facility] Summary Report / Loop Recorder 

## 2022-11-13 DIAGNOSIS — C44319 Basal cell carcinoma of skin of other parts of face: Secondary | ICD-10-CM | POA: Diagnosis not present

## 2022-11-20 ENCOUNTER — Ambulatory Visit (INDEPENDENT_AMBULATORY_CARE_PROVIDER_SITE_OTHER): Payer: Medicare Other

## 2022-11-20 DIAGNOSIS — I63449 Cerebral infarction due to embolism of unspecified cerebellar artery: Secondary | ICD-10-CM | POA: Diagnosis not present

## 2022-11-21 LAB — CUP PACEART REMOTE DEVICE CHECK
Date Time Interrogation Session: 20240913230807
Implantable Pulse Generator Implant Date: 20210414

## 2022-12-06 NOTE — Progress Notes (Signed)
[facility] Summary Report / Loop Recorder 

## 2022-12-25 ENCOUNTER — Ambulatory Visit: Payer: Medicare Other

## 2022-12-25 DIAGNOSIS — I63449 Cerebral infarction due to embolism of unspecified cerebellar artery: Secondary | ICD-10-CM

## 2022-12-26 LAB — CUP PACEART REMOTE DEVICE CHECK
Date Time Interrogation Session: 20241020230203
Implantable Pulse Generator Implant Date: 20210414

## 2023-01-11 NOTE — Progress Notes (Signed)
[facility] Summary Report / Loop Recorder 

## 2023-01-29 ENCOUNTER — Ambulatory Visit (INDEPENDENT_AMBULATORY_CARE_PROVIDER_SITE_OTHER): Payer: Medicare Other

## 2023-01-29 DIAGNOSIS — I63449 Cerebral infarction due to embolism of unspecified cerebellar artery: Secondary | ICD-10-CM

## 2023-01-29 LAB — CUP PACEART REMOTE DEVICE CHECK
Date Time Interrogation Session: 20241122230927
Implantable Pulse Generator Implant Date: 20210414

## 2023-02-19 ENCOUNTER — Telehealth: Payer: Self-pay

## 2023-02-19 NOTE — Telephone Encounter (Signed)
Alert received from [employer] for ILR alert for AF, duration , mean HR 130 EGM ST with PAC's vs AF, ASA only, hx of PSVT - route to triage for review.  Patient reports he took the covid vaccine last Friday and felt "awful" over the weekend. Pt reports today he is feeling better.   Advised patient I will forward to Dr. Graciela Husbands for review. Pt will call if any symptoms arise.

## 2023-02-23 ENCOUNTER — Ambulatory Visit: Payer: Medicare Other | Attending: Physician Assistant | Admitting: Physician Assistant

## 2023-02-23 ENCOUNTER — Encounter: Payer: Self-pay | Admitting: Physician Assistant

## 2023-02-23 ENCOUNTER — Telehealth: Payer: Self-pay | Admitting: Internal Medicine

## 2023-02-23 VITALS — BP 138/60 | HR 129 | Ht 70.5 in | Wt 247.2 lb

## 2023-02-23 DIAGNOSIS — Z4509 Encounter for adjustment and management of other cardiac device: Secondary | ICD-10-CM

## 2023-02-23 DIAGNOSIS — I471 Supraventricular tachycardia, unspecified: Secondary | ICD-10-CM | POA: Diagnosis not present

## 2023-02-23 DIAGNOSIS — I4892 Unspecified atrial flutter: Secondary | ICD-10-CM

## 2023-02-23 DIAGNOSIS — I480 Paroxysmal atrial fibrillation: Secondary | ICD-10-CM | POA: Diagnosis not present

## 2023-02-23 DIAGNOSIS — I493 Ventricular premature depolarization: Secondary | ICD-10-CM | POA: Diagnosis not present

## 2023-02-23 DIAGNOSIS — Z9889 Other specified postprocedural states: Secondary | ICD-10-CM | POA: Diagnosis not present

## 2023-02-23 MED ORDER — APIXABAN 5 MG PO TABS: 5.0000 mg | ORAL_TABLET | Freq: Two times a day (BID) | ORAL | Status: DC

## 2023-02-23 MED ORDER — APIXABAN 5 MG PO TABS: 5.0000 mg | ORAL_TABLET | Freq: Two times a day (BID) | ORAL | 2 refills | Status: DC

## 2023-02-23 MED ORDER — CARVEDILOL 12.5 MG PO TABS: 12.5000 mg | ORAL_TABLET | Freq: Two times a day (BID) | ORAL | 3 refills | Status: AC

## 2023-02-23 NOTE — Telephone Encounter (Signed)
Patient c/o Palpitations:  STAT if patient reporting lightheadedness, shortness of breath, or chest pain  How long have you had palpitations/irregular HR/ Afib? Are you having the symptoms now? Been in Afib since yesterday- heart rate been 125 since yesterday  Are you currently experiencing lightheadedness, SOB or CP? Very short of breath, not much chest pain, off and no  Do you have a history of afib (atrial fibrillation) or irregular heart rhythm?   Have you checked your BP or HR? (document readings if available):   Are you experiencing any other symptoms?

## 2023-02-23 NOTE — H&P (View-Only) (Signed)
Cardiology Office Note Date:  02/23/2023  Patient ID:  Alec Maldonado, Alec Maldonado 10-31-1950, MRN [id number] PCP:  Eustaquio Boyden, MD  Cardiologist:  Dr. Cristal Deer Electrophysiologist: Dr. Graciela Husbands    Chief Complaint:  palpitations/tachycardia   History of Present Illness: Alec Maldonado is a 72 y.o. male with history of VHD s/p MV repair (2001), HLD, SVT (converted with adenosine and vagal maneuver), migraines, stroke, DM, RBBB  Atrial fibrillation detected 07/30/20 loop interrogation Cryptogenic stroke: -followed by Dr. Graciela Husbands. Per comments, very brief afib, longest event 14 minutes. Given brevity, anticoagulation not started  He saw Dr. Cristal Deer 12/28/20, pt noted a decrease in resting HRs from 60's-70's > 50's-60's, some mild CP, not like that of his SVT. Noted PACs, PVCs, planned to monitor via his loop with no symptoms.  November ILR noted 2 episodes of SVT and Dr. Graciela Husbands recommended discussion with the pt management options.  I saw him 03/10/22,  He reports his SVT goes back about 6 years 3x he sought ER visit, once it stopped spontaneously after arrival, once treated successfully with adenosine and another success with vagal. He is certainly aware of it, feels like his heart is going to pound right out of his chest feels a bit heavy when in it as well. The episode that happened in Nov he felt it, after a few minutes took a extra coreg, laid on the floor, put his feet up/beared down (like they showed him at the ER once) and it stopped. He is doing very well, this the first episode in years (the first since device implant (2021). No CP, palpitations otherwise No SOB, DOE + SVTs,  Episode labeled AFib reviewed as well, artifact made difficult, others SR w/ectopy Unclear if any true AFib Pt did not want to pursue ablatio for SVT or make any med changes Vagal maneuver's and/or extra coreg dose working for him)  ILR alert for tachy/AF labeled episodes Pt reported having gotten the  COVID vaccine last week/Friday feeling fairly awful over the weekend though improved of late  TODAY He is accompanied by his wife He got the COVID and FLU vaccines on Friday, by the evening starte to feel terrible, as if he had the flu/illness, Monday started to wear off, but yesterday started to feel a little SOB Little "twinges" of pain in his chest here/there, fleeting, not exertional. No near syncope or syncope  He had tried a couple times extra coreg doses as well as vagal maneuvers, but neither worked   [employer] MDT LINQ II implanted 06/18/19, cryptogenic stroke   Past Medical History:  Diagnosis Date   Allergy    Aortic insufficiency    Mild, echo, January, 2014   Basal cell carcinoma (BCC) of multiple sites 2023   R lateral upper back, l ear antihelix (Dr Yetta Barre)   [facility] (basal cell carcinoma), back 2017   s/p excisional biospy by derm   BPH (benign prostatic hypertrophy)    Central retinal artery occlusion of left eye 06/16/2019   S/p ER eval 06/2019 Saw neurology Pearlean Brownie) - left eye vision loss due to central retinal artery occlusion in April 2021, MRI scan showing silent cerebral embolic infarcts of cryptogenic etiology   Chest discomfort    nuclear 06/24/09, suggestion prior small inferior MI with moderate peri-infarvt ischemia versus variable diaphragmatic attenuation   Colon polyps    colonoscopy 07/02/06, repeat in 10 years   Coronary artery disease    Dyslipidemia    Dysplastic nevus of trunk  08/20/2012   Dysrhythmia    supraventricular tachycardia, paplitations   Ejection fraction    EF 55%, echo, October, 2010  //   EF 45-50%,  catheterization, 2011   Heart murmur    History 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 TEE 06/2019 IMPRESSION: 1. Left ventricular ejection fraction, by estimation, is 50 to 55%. The  left ventricle has low normal function. The left ventricle has no regional wall motion abnormalities. There is severe asymmetric left ventricular hypertrophy 2. Right ventricular systolic function is normal.   Hyperlipidemia    Migraines    rare   Mitral regurgitation    Mitral valve repair 2001   Neurofibroma of foot 04/2017   s/p removal by derm   Palpitations 07/2009    helped with carvedilol    Pleurisy    Postural dizziness    mild, no orthostatic changes by blood pressure check   Pre-diabetes    Pre-syncope    January, 2014   Right ventricular dysfunction    Mild/moderate right ventricular dysfunction by echo, January, 2014, no tricuspid regurgitation, therefore right heart pressure could not be estimated.   Shortness of breath    Status post mitral valve repair    2001,  //  echo, October, 2010, good function of the repaired mitral valve   Stroke [facility]) 2021   lt eye    SVT (supraventricular tachycardia) 03/11/2013   Isolated episodes - last 03/2013 after flu treated with adenosine cardioversion in ER.    Thyroid disease     Past Surgical History:  Procedure Laterality Date   aneurysmal dilatation     proximal LAD cath 2001, reoeat catheter 2004, no significant change   aneurysmal dilatiation  07/15/09   no change, normal right heart filling pressures and LVEDP- EF 45-50%   APPENDECTOMY  1970   BUBBLE STUDY  06/18/2019   Procedure: BUBBLE STUDY;  Surgeon: Quintella Reichert, MD;  Location: [facility] ENDOSCOPY;  Service: Cardiovascular;;   CARDIAC CATHETERIZATION  2010   mild global LV dysfunction EF 45-50%, no significant CAD obstruction, aneurysmal dilatation LAD   COLONOSCOPY  2008   COLONOSCOPY  06/2016   diverticulosis, rpt 10 yrs Christella Hartigan)   CORONARY ANGIOPLASTY  09/1999   severe MR o/w normal limits   CORONARY ANGIOPLASTY  12/19/02   pos. CAD, mild LV dysfunction- med treatment   EYE SURGERY Left    HERNIA REPAIR  01/11/05   left, Dr. Daphine Deutscher   I & D EXTREMITY Right  02/04/2015   Procedure: IRRIGATION AND DEBRIDEMENT RIGHT INDEX FINGER OPEN FRACTURE AND REPAIR DIGITAL NERVE AND ARTERY;  Surgeon: Betha Loa, MD;  Location: [facility] OR;  Service: Orthopedics;  Laterality: Right;   LOOP RECORDER INSERTION N/A 06/18/2019   Procedure: LOOP RECORDER INSERTION;  Surgeon: Duke Salvia, MD;  Location: [facility] INVASIVE CV LAB;  Service: Cardiovascular;  Laterality: N/A;   MITRAL VALVE REPAIR  01/22/00   good mitral valve function- echo 12/2008- EF 55% / nuclear 06/2009- EF 36%,? reliable? / EF 45-50%, cath rated 02/2010   ORIF ELBOW FRACTURE     left   POLYPECTOMY     TEE WITHOUT CARDIOVERSION N/A 06/18/2019   Procedure: TRANSESOPHAGEAL ECHOCARDIOGRAM (TEE);  Surgeon: Quintella Reichert, MD;  Location: [facility] ENDOSCOPY;  Service: Cardiovascular;  Laterality: N/A;   THYROID SURGERY     THYROIDECTOMY     left,  due to goiter 1994   TOTAL KNEE ARTHROPLASTY Right 06/29/2020   Procedure: RIGHT TOTAL KNEE ARTHROPLASTY;  Surgeon: Valeria Batman, MD;  Location: [facility] ORS;  Service: Orthopedics;  Laterality: Right;    Current Outpatient Medications  Medication Sig Dispense Refill   acetaminophen (TYLENOL) 500 MG tablet Take 500 mg by mouth every 6 (six) hours as needed.     albuterol (VENTOLIN HFA) 108 (90 Base) MCG/ACT inhaler Inhale 2 puffs into the lungs every 6 (six) hours as needed for wheezing or shortness of breath. 8 g 1   aspirin 81 MG EC tablet Take 1 tablet (81 mg total) by mouth daily. Swallow whole. 30 tablet 12   butalbital-acetaminophen-caffeine (FIORICET) 50-325-40 MG tablet Take 1 tablet by mouth 3 (three) times daily as needed for migraine. 20 tablet 0   carvedilol (COREG) 6.25 MG tablet Take 1 tablet (6.25 mg total) by mouth 2 (two) times daily with a meal. 180 tablet 4   chlorpheniramine-HYDROcodone (TUSSIONEX) 10-8 MG/5ML Take 5 mLs by mouth every 12 (twelve) hours as needed for cough. 120 mL 0   ezetimibe (ZETIA) 10 MG tablet TAKE 1 TABLET(10 MG) BY MOUTH DAILY 90  tablet 4   finasteride (PROSCAR) 5 MG tablet Take 1 tablet (5 mg total) by mouth daily. 90 tablet 4   Omega-3 Fatty Acids (FISH OIL) 1200 MG CAPS Take 1,200 mg by mouth daily.     predniSONE (DELTASONE) 20 MG tablet Take two tablets daily for 4 days followed by one tablet daily for 4 days 12 tablet 0   simvastatin (ZOCOR) 80 MG tablet Take 1 tablet (80 mg total) by mouth daily at 6 PM. 90 tablet 4   tamsulosin (FLOMAX) 0.4 MG CAPS capsule Take 2 capsules (0.8 mg total) by mouth at bedtime. 180 capsule 4   No current facility-administered medications for this visit.    Allergies:   Patient has no known allergies.   Social History:  The patient  reports that he has never smoked. He has never used smokeless tobacco. He reports that he does not drink alcohol and does not use drugs.   Family History:  The patient's family history includes Alzheimer's disease in his mother; Arthritis in his father; Benign prostatic hyperplasia in his father; Heart disease in his brother, father, and mother.  ROS:  Please see the history of present illness.    All other systems are reviewed and otherwise negative.   PHYSICAL EXAM:  VS:  There were no vitals taken for this visit. BMI: There is no height or weight on file to calculate BMI. Well nourished, well developed, in no acute distress HEENT: normocephalic, atraumatic Neck: no JVD, carotid bruits or masses Cardiac: RRR, tachycardic; no significant murmurs, no rubs, or gallops Lungs: CTA b/l, no wheezing, rhonchi or rales Abd: soft, nontender MS: no deformity or atrophy Ext: trace edema Skin: warm and dry, no rash Neuro:  No gross deficits appreciated Psych: euthymic mood, full affect   ILR site is stable, no tethering or discomfort   EKG:  Done today and reviewed by myself shows  AFlutter 129bpm, RBBB  Device interrogation done today and reviewed by myself:  Battery is good, Has been steady 120's HR since about the 13th Appears an SVT (by  today's 12 lead, AFlutter)     Cardiac MRI 08/11/2019: FINDINGS: The patient is s/p sternotomy. Limited images of the lung fields showed no gross abnormalities.   Normal left ventricular size with normal wall thickness. LV EF 54%,  mild mid inferolateral wall hypokinesis with septal bounce consistent with prior cardiac surgery noted. Mildly dilated right ventricle with mildly decreased systolic function, EF 37%. Mild biatrial enlargement. Lipomatous interatrial septal hypertrophy. Trileaflet aortic valve with mild regurgitation, no significant stenosis. The patient is s/p mitral valve repair, trivial mitral regurgitation noted.   On delayed enhancement imaging, there was no myocardial late gadolinium enhancement (LGE).   Measurements:   LVEDV 171 mL  LVSV 92 mL   LVEF 54%   RVEDV 190 mL  RVSV 70 mL   RVEF 37%   IMPRESSION: 1. Normal LV size and wall thickness, no evidence for hypertrophic cardiomyopathy. EF 54%, mild mid inferolateral hypokinesis with septal bounce consistent with prior surgery.   2. Mildly dilated RV with mildly decreased systolic function, EF 37%.   3.  S/p mitral valve repair with minimal MR noted.   4. No myocardial LGE, so no definitive evidence for prior MI, myocarditis, or infiltrative disease.   06/18/19: TEE 1. Left ventricular ejection fraction, by estimation, is 50 to 55%. The  left ventricle has low normal function. The left ventricle has no regional  wall motion abnormalities.   2. Right ventricular systolic function is normal. The right ventricular  size is normal.   3. Left atrial size was severely dilated. No left atrial/left atrial  appendage thrombus was detected.   4. S/P MV repair. The MV is degenerative with thickened and calcified  leaflets. There is some restricted leaflet motion but no mitral stenosis  with mean MVG of . There is mild mitral regurgitation.   5. The aortic valve is tricuspid. Aortic valve  regurgitation is mild.  Mild aortic valve sclerosis is present, with no evidence of aortic valve  stenosis.   6. The inferior vena cava is normal in size with greater than 50%  respiratory variability, suggesting right atrial pressure of 3 mmHg.   7. The interatrial septum appears to be lipomatous. No atrial level shunt  detected by color flow Doppler. Agitated saline contrast was given  intravenously to evaluate for intracardiac shunting. There is no evidence  of a patent foramen ovale. There is no   evidence of an atrial septal defect.   Conclusion(s)/Recommendation(s): No source of embolism noted.  FINDINGS    06/16/19: TTE IMPRESSIONS  1. Left ventricular ejection fraction, by estimation, is 50 to 55%. The  left ventricle has low normal function. The left ventricle has no regional  wall motion abnormalities. The left ventricular internal cavity size was  mildly dilated. There is severe  asymmetric left ventricular hypertrophy. Left ventricular diastolic  parameters are consistent with Grade I diastolic dysfunction (impaired  relaxation).   2. Right ventricular systolic function is normal. The right ventricular  size is normal. There is normal pulmonary artery systolic pressure.   3. Left atrial size was severely dilated. (measured 5.3cm)  4. The mitral valve is degenerative. Trivial mitral valve regurgitation.  No evidence of mitral stenosis.   5. The aortic valve is tricuspid. Aortic valve regurgitation is trivial.  No aortic stenosis is present.   6. Aortic dilatation noted. There is mild dilatation of the ascending  aorta.   7. The inferior vena cava is normal in size with greater than 50%  respiratory variability, suggesting right atrial pressure of 3 mmHg.     Recent Labs: 06/19/2022: ALT 18; BUN 14; Creatinine, Ser 1.08; Hemoglobin 13.4; Platelets 267.0; Potassium 4.1; Sodium 141  06/19/2022: Cholesterol 111; HDL 36.00; LDL Cholesterol 56; Total CHOL/HDL Ratio 3;  Triglycerides 93.0; VLDL 18.6   CrCl cannot be calculated (Patient's most recent lab result is older than the maximum 21 days allowed.).   Wt Readings from Last 3 Encounters:  10/13/22 240 lb (108.9 kg)  06/23/22 241 lb (109.3 kg)  06/19/22 239 lb 6 oz (108.6 kg)     Other studies reviewed: Additional studies/records reviewed today include: summarized above  ASSESSMENT AND PLAN:  ILR As aobe  SVT He is quite happy with his burden and strategy for management (vagal maneuver +/- and extra coreg) Has not wanted to start additional/more meds for an infrequent SVT Has not wanted to pursue ablation/procedural strategy    Paroxysmal AFib CHA2DS2Vasc is 5 Seen remotely by ILR and brief/SCAF not felt to warrant OAC He has been tachycardia for a week Today is AFlutter  Discussed today stroke risk associated with AFib/flutter (his wife has AFib on [facility] and has had 2 ablations) they both understand the rational for a/c He is reluctant, mostly 2/2 cost burden it seem He does not want to go to the ER, feels "OK"  In review with DOD Start OAC, increase his coreg given his BP is so good plan to revisit his rhythm Monday, if remains in AFlutter plan TEE/DCCV  I have sent a message to the device clinic to pull a carelink transmission Monday  He is agreeable to starting Eliquis, denies any bleeding/heme history, no recent procedures/surgeries Start 5mg  BID Eliquis Increase his coreg to 12.5mg  BID   VHD w/MVR Functioning well on last echo  LVH No LVH, LGE or infiltrative disease on MRI  6. PVCs Not noted today  1.1% burden by his monitor   Disposition:  will plan 2-3 week follow up, pending rhythm check Monday and management plans. Discussed with the patient/wide ER precautions   Current medicines are reviewed at length with the patient today.  The patient did not have any concerns regarding medicines.  Norma Fredrickson, PA-C 02/23/2023 12:27 PM     [facility] [address] [place] [address] [phone] (office)  [phone] (fax)

## 2023-02-23 NOTE — Patient Instructions (Signed)
Medication Instructions:   START TAKING : ELIQUIS 5 MG TWICE A DAY   START TAKING :  CARVEDILOL(COREG) 12.5 MG TWICE A DAY   *If you need a refill on your cardiac medications before your next appointment, please call your pharmacy*   Lab Work:  NONE ORDERED  TODAY    If you have labs (blood work) drawn today and your tests are completely normal, you will receive your results only by: MyChart Message (if you have MyChart) OR A paper copy in the mail If you have any lab test that is abnormal or we need to change your treatment, we will call you to review the results.   Testing/Procedures: NONE ORDERED  TODAY     Follow-Up: At [facility], you and your health needs are our priority.  As part of our continuing mission to provide you with exceptional heart care, we have created designated Provider Care Teams.  These Care Teams include your primary Cardiologist (physician) and Advanced Practice Providers (APPs -  Physician Assistants and Nurse Practitioners) who all work together to provide you with the care you need, when you need it.  We recommend signing up for the patient portal called "MyChart".  Sign up information is provided on this After Visit Summary.  MyChart is used to connect with patients for Virtual Visits (Telemedicine).  Patients are able to view lab/test results, encounter notes, upcoming appointments, etc.  Non-urgent messages can be sent to your provider as well.   To learn more about what you can do with MyChart, go to [email or web address].    Your next appointment:   2 -3  week(s) ( CONTACT  CASSIE HALL/ ANGELINE HAMMER FOR EP SCHEDULING ISSUES )   Provider:   Canary Brim, NP, Doreatha Martin, PA-C, or Francis Dowse, PA-C    Other Instructions

## 2023-02-23 NOTE — Progress Notes (Addendum)
Cardiology Office Note Date:  02/23/2023  Patient ID:  Alec Maldonado, Alec Maldonado 10-31-1950, MRN [id number] PCP:  Eustaquio Boyden, MD  Cardiologist:  Dr. Cristal Deer Electrophysiologist: Dr. Graciela Husbands    Chief Complaint:  palpitations/tachycardia   History of Present Illness: Alec Maldonado is a 72 y.o. male with history of VHD s/p MV repair (2001), HLD, SVT (converted with adenosine and vagal maneuver), migraines, stroke, DM, RBBB  Atrial fibrillation detected 07/30/20 loop interrogation Cryptogenic stroke: -followed by Dr. Graciela Husbands. Per comments, very brief afib, longest event 14 minutes. Given brevity, anticoagulation not started  He saw Dr. Cristal Deer 12/28/20, pt noted a decrease in resting HRs from 60's-70's > 50's-60's, some mild CP, not like that of his SVT. Noted PACs, PVCs, planned to monitor via his loop with no symptoms.  November ILR noted 2 episodes of SVT and Dr. Graciela Husbands recommended discussion with the pt management options.  I saw him 03/10/22,  He reports his SVT goes back about 6 years 3x he sought ER visit, once it stopped spontaneously after arrival, once treated successfully with adenosine and another success with vagal. He is certainly aware of it, feels like his heart is going to pound right out of his chest feels a bit heavy when in it as well. The episode that happened in Nov he felt it, after a few minutes took a extra coreg, laid on the floor, put his feet up/beared down (like they showed him at the ER once) and it stopped. He is doing very well, this the first episode in years (the first since device implant (2021). No CP, palpitations otherwise No SOB, DOE + SVTs,  Episode labeled AFib reviewed as well, artifact made difficult, others SR w/ectopy Unclear if any true AFib Pt did not want to pursue ablatio for SVT or make any med changes Vagal maneuver's and/or extra coreg dose working for him)  ILR alert for tachy/AF labeled episodes Pt reported having gotten the  COVID vaccine last week/Friday feeling fairly awful over the weekend though improved of late  TODAY He is accompanied by his wife He got the COVID and FLU vaccines on Friday, by the evening starte to feel terrible, as if he had the flu/illness, Monday started to wear off, but yesterday started to feel a little SOB Little "twinges" of pain in his chest here/there, fleeting, not exertional. No near syncope or syncope  He had tried a couple times extra coreg doses as well as vagal maneuvers, but neither worked   [employer] MDT LINQ II implanted 06/18/19, cryptogenic stroke   Past Medical History:  Diagnosis Date   Allergy    Aortic insufficiency    Mild, echo, January, 2014   Basal cell carcinoma (BCC) of multiple sites 2023   R lateral upper back, l ear antihelix (Dr Yetta Barre)   [facility] (basal cell carcinoma), back 2017   s/p excisional biospy by derm   BPH (benign prostatic hypertrophy)    Central retinal artery occlusion of left eye 06/16/2019   S/p ER eval 06/2019 Saw neurology Pearlean Brownie) - left eye vision loss due to central retinal artery occlusion in April 2021, MRI scan showing silent cerebral embolic infarcts of cryptogenic etiology   Chest discomfort    nuclear 06/24/09, suggestion prior small inferior MI with moderate peri-infarvt ischemia versus variable diaphragmatic attenuation   Colon polyps    colonoscopy 07/02/06, repeat in 10 years   Coronary artery disease    Dyslipidemia    Dysplastic nevus of trunk  08/20/2012   Dysrhythmia    supraventricular tachycardia, paplitations   Ejection fraction    EF 55%, echo, October, 2010  //   EF 45-50%,  catheterization, 2011   Heart murmur    History 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 TEE 06/2019 IMPRESSION: 1. Left ventricular ejection fraction, by estimation, is 50 to 55%. The  left ventricle has low normal function. The left ventricle has no regional wall motion abnormalities. There is severe asymmetric left ventricular hypertrophy 2. Right ventricular systolic function is normal.   Hyperlipidemia    Migraines    rare   Mitral regurgitation    Mitral valve repair 2001   Neurofibroma of foot 04/2017   s/p removal by derm   Palpitations 07/2009    helped with carvedilol    Pleurisy    Postural dizziness    mild, no orthostatic changes by blood pressure check   Pre-diabetes    Pre-syncope    January, 2014   Right ventricular dysfunction    Mild/moderate right ventricular dysfunction by echo, January, 2014, no tricuspid regurgitation, therefore right heart pressure could not be estimated.   Shortness of breath    Status post mitral valve repair    2001,  //  echo, October, 2010, good function of the repaired mitral valve   Stroke [facility]) 2021   lt eye    SVT (supraventricular tachycardia) 03/11/2013   Isolated episodes - last 03/2013 after flu treated with adenosine cardioversion in ER.    Thyroid disease     Past Surgical History:  Procedure Laterality Date   aneurysmal dilatation     proximal LAD cath 2001, reoeat catheter 2004, no significant change   aneurysmal dilatiation  07/15/09   no change, normal right heart filling pressures and LVEDP- EF 45-50%   APPENDECTOMY  1970   BUBBLE STUDY  06/18/2019   Procedure: BUBBLE STUDY;  Surgeon: Quintella Reichert, MD;  Location: [facility] ENDOSCOPY;  Service: Cardiovascular;;   CARDIAC CATHETERIZATION  2010   mild global LV dysfunction EF 45-50%, no significant CAD obstruction, aneurysmal dilatation LAD   COLONOSCOPY  2008   COLONOSCOPY  06/2016   diverticulosis, rpt 10 yrs Christella Hartigan)   CORONARY ANGIOPLASTY  09/1999   severe MR o/w normal limits   CORONARY ANGIOPLASTY  12/19/02   pos. CAD, mild LV dysfunction- med treatment   EYE SURGERY Left    HERNIA REPAIR  01/11/05   left, Dr. Daphine Deutscher   I & D EXTREMITY Right  02/04/2015   Procedure: IRRIGATION AND DEBRIDEMENT RIGHT INDEX FINGER OPEN FRACTURE AND REPAIR DIGITAL NERVE AND ARTERY;  Surgeon: Betha Loa, MD;  Location: [facility] OR;  Service: Orthopedics;  Laterality: Right;   LOOP RECORDER INSERTION N/A 06/18/2019   Procedure: LOOP RECORDER INSERTION;  Surgeon: Duke Salvia, MD;  Location: [facility] INVASIVE CV LAB;  Service: Cardiovascular;  Laterality: N/A;   MITRAL VALVE REPAIR  01/22/00   good mitral valve function- echo 12/2008- EF 55% / nuclear 06/2009- EF 36%,? reliable? / EF 45-50%, cath rated 02/2010   ORIF ELBOW FRACTURE     left   POLYPECTOMY     TEE WITHOUT CARDIOVERSION N/A 06/18/2019   Procedure: TRANSESOPHAGEAL ECHOCARDIOGRAM (TEE);  Surgeon: Quintella Reichert, MD;  Location: [facility] ENDOSCOPY;  Service: Cardiovascular;  Laterality: N/A;   THYROID SURGERY     THYROIDECTOMY     left,  due to goiter 1994   TOTAL KNEE ARTHROPLASTY Right 06/29/2020   Procedure: RIGHT TOTAL KNEE ARTHROPLASTY;  Surgeon: Valeria Batman, MD;  Location: [facility] ORS;  Service: Orthopedics;  Laterality: Right;    Current Outpatient Medications  Medication Sig Dispense Refill   acetaminophen (TYLENOL) 500 MG tablet Take 500 mg by mouth every 6 (six) hours as needed.     albuterol (VENTOLIN HFA) 108 (90 Base) MCG/ACT inhaler Inhale 2 puffs into the lungs every 6 (six) hours as needed for wheezing or shortness of breath. 8 g 1   aspirin 81 MG EC tablet Take 1 tablet (81 mg total) by mouth daily. Swallow whole. 30 tablet 12   butalbital-acetaminophen-caffeine (FIORICET) 50-325-40 MG tablet Take 1 tablet by mouth 3 (three) times daily as needed for migraine. 20 tablet 0   carvedilol (COREG) 6.25 MG tablet Take 1 tablet (6.25 mg total) by mouth 2 (two) times daily with a meal. 180 tablet 4   chlorpheniramine-HYDROcodone (TUSSIONEX) 10-8 MG/5ML Take 5 mLs by mouth every 12 (twelve) hours as needed for cough. 120 mL 0   ezetimibe (ZETIA) 10 MG tablet TAKE 1 TABLET(10 MG) BY MOUTH DAILY 90  tablet 4   finasteride (PROSCAR) 5 MG tablet Take 1 tablet (5 mg total) by mouth daily. 90 tablet 4   Omega-3 Fatty Acids (FISH OIL) 1200 MG CAPS Take 1,200 mg by mouth daily.     predniSONE (DELTASONE) 20 MG tablet Take two tablets daily for 4 days followed by one tablet daily for 4 days 12 tablet 0   simvastatin (ZOCOR) 80 MG tablet Take 1 tablet (80 mg total) by mouth daily at 6 PM. 90 tablet 4   tamsulosin (FLOMAX) 0.4 MG CAPS capsule Take 2 capsules (0.8 mg total) by mouth at bedtime. 180 capsule 4   No current facility-administered medications for this visit.    Allergies:   Patient has no known allergies.   Social History:  The patient  reports that he has never smoked. He has never used smokeless tobacco. He reports that he does not drink alcohol and does not use drugs.   Family History:  The patient's family history includes Alzheimer's disease in his mother; Arthritis in his father; Benign prostatic hyperplasia in his father; Heart disease in his brother, father, and mother.  ROS:  Please see the history of present illness.    All other systems are reviewed and otherwise negative.   PHYSICAL EXAM:  VS:  There were no vitals taken for this visit. BMI: There is no height or weight on file to calculate BMI. Well nourished, well developed, in no acute distress HEENT: normocephalic, atraumatic Neck: no JVD, carotid bruits or masses Cardiac: RRR, tachycardic; no significant murmurs, no rubs, or gallops Lungs: CTA b/l, no wheezing, rhonchi or rales Abd: soft, nontender MS: no deformity or atrophy Ext: trace edema Skin: warm and dry, no rash Neuro:  No gross deficits appreciated Psych: euthymic mood, full affect   ILR site is stable, no tethering or discomfort   EKG:  Done today and reviewed by myself shows  AFlutter 129bpm, RBBB  Device interrogation done today and reviewed by myself:  Battery is good, Has been steady 120's HR since about the 13th Appears an SVT (by  today's 12 lead, AFlutter)     Cardiac MRI 08/11/2019: FINDINGS: The patient is s/p sternotomy. Limited images of the lung fields showed no gross abnormalities.   Normal left ventricular size with normal wall thickness. LV EF 54%,  mild mid inferolateral wall hypokinesis with septal bounce consistent with prior cardiac surgery noted. Mildly dilated right ventricle with mildly decreased systolic function, EF 37%. Mild biatrial enlargement. Lipomatous interatrial septal hypertrophy. Trileaflet aortic valve with mild regurgitation, no significant stenosis. The patient is s/p mitral valve repair, trivial mitral regurgitation noted.   On delayed enhancement imaging, there was no myocardial late gadolinium enhancement (LGE).   Measurements:   LVEDV 171 mL  LVSV 92 mL   LVEF 54%   RVEDV 190 mL  RVSV 70 mL   RVEF 37%   IMPRESSION: 1. Normal LV size and wall thickness, no evidence for hypertrophic cardiomyopathy. EF 54%, mild mid inferolateral hypokinesis with septal bounce consistent with prior surgery.   2. Mildly dilated RV with mildly decreased systolic function, EF 37%.   3.  S/p mitral valve repair with minimal MR noted.   4. No myocardial LGE, so no definitive evidence for prior MI, myocarditis, or infiltrative disease.   06/18/19: TEE 1. Left ventricular ejection fraction, by estimation, is 50 to 55%. The  left ventricle has low normal function. The left ventricle has no regional  wall motion abnormalities.   2. Right ventricular systolic function is normal. The right ventricular  size is normal.   3. Left atrial size was severely dilated. No left atrial/left atrial  appendage thrombus was detected.   4. S/P MV repair. The MV is degenerative with thickened and calcified  leaflets. There is some restricted leaflet motion but no mitral stenosis  with mean MVG of . There is mild mitral regurgitation.   5. The aortic valve is tricuspid. Aortic valve  regurgitation is mild.  Mild aortic valve sclerosis is present, with no evidence of aortic valve  stenosis.   6. The inferior vena cava is normal in size with greater than 50%  respiratory variability, suggesting right atrial pressure of 3 mmHg.   7. The interatrial septum appears to be lipomatous. No atrial level shunt  detected by color flow Doppler. Agitated saline contrast was given  intravenously to evaluate for intracardiac shunting. There is no evidence  of a patent foramen ovale. There is no   evidence of an atrial septal defect.   Conclusion(s)/Recommendation(s): No source of embolism noted.  FINDINGS    06/16/19: TTE IMPRESSIONS  1. Left ventricular ejection fraction, by estimation, is 50 to 55%. The  left ventricle has low normal function. The left ventricle has no regional  wall motion abnormalities. The left ventricular internal cavity size was  mildly dilated. There is severe  asymmetric left ventricular hypertrophy. Left ventricular diastolic  parameters are consistent with Grade I diastolic dysfunction (impaired  relaxation).   2. Right ventricular systolic function is normal. The right ventricular  size is normal. There is normal pulmonary artery systolic pressure.   3. Left atrial size was severely dilated. (measured 5.3cm)  4. The mitral valve is degenerative. Trivial mitral valve regurgitation.  No evidence of mitral stenosis.   5. The aortic valve is tricuspid. Aortic valve regurgitation is trivial.  No aortic stenosis is present.   6. Aortic dilatation noted. There is mild dilatation of the ascending  aorta.   7. The inferior vena cava is normal in size with greater than 50%  respiratory variability, suggesting right atrial pressure of 3 mmHg.     Recent Labs: 06/19/2022: ALT 18; BUN 14; Creatinine, Ser 1.08; Hemoglobin 13.4; Platelets 267.0; Potassium 4.1; Sodium 141  06/19/2022: Cholesterol 111; HDL 36.00; LDL Cholesterol 56; Total CHOL/HDL Ratio 3;  Triglycerides 93.0; VLDL 18.6   CrCl cannot be calculated (Patient's most recent lab result is older than the maximum 21 days allowed.).   Wt Readings from Last 3 Encounters:  10/13/22 240 lb (108.9 kg)  06/23/22 241 lb (109.3 kg)  06/19/22 239 lb 6 oz (108.6 kg)     Other studies reviewed: Additional studies/records reviewed today include: summarized above  ASSESSMENT AND PLAN:  ILR As aobe  SVT He is quite happy with his burden and strategy for management (vagal maneuver +/- and extra coreg) Has not wanted to start additional/more meds for an infrequent SVT Has not wanted to pursue ablation/procedural strategy    Paroxysmal AFib CHA2DS2Vasc is 5 Seen remotely by ILR and brief/SCAF not felt to warrant OAC He has been tachycardia for a week Today is AFlutter  Discussed today stroke risk associated with AFib/flutter (his wife has AFib on [facility] and has had 2 ablations) they both understand the rational for a/c He is reluctant, mostly 2/2 cost burden it seem He does not want to go to the ER, feels "OK"  In review with DOD Start OAC, increase his coreg given his BP is so good plan to revisit his rhythm Monday, if remains in AFlutter plan TEE/DCCV  I have sent a message to the device clinic to pull a carelink transmission Monday  He is agreeable to starting Eliquis, denies any bleeding/heme history, no recent procedures/surgeries Start 5mg  BID Eliquis Increase his coreg to 12.5mg  BID   VHD w/MVR Functioning well on last echo  LVH No LVH, LGE or infiltrative disease on MRI  6. PVCs Not noted today  1.1% burden by his monitor   Disposition:  will plan 2-3 week follow up, pending rhythm check Monday and management plans. Discussed with the patient/wide ER precautions   Current medicines are reviewed at length with the patient today.  The patient did not have any concerns regarding medicines.  Norma Fredrickson, PA-C 02/23/2023 12:27 PM     [facility] [address] [place] [address] [phone] (office)  [phone] (fax)

## 2023-02-23 NOTE — Telephone Encounter (Signed)
This is a Dr. Graciela Husbands pt.  Pt is calling in with complaints of being in afib since Monday, followed by his flu/covid vaccine he had last Friday.    He states after the flu/covid vaccine, he developed a cold last weekend and noted that's when he went into afib.  He states he was notified by our office on Monday that he was back in afib, as detected by his ILR.   Pt states over the last 2 days his afib has made him super symptomatic, with chest discomfort, palpitations, dizziness, sob, and doe.  He states he can't get his HR under 120, with last HR reading this morning at 125 bpm.  Pt states he can usually get himself out of afib, but has been unsuccessful in achieving this.   Pt would like to be seen for symptomatic afib.   Scheduled the pt to come into the office today to see Francis Dowse PA-C at 3:10 pm.   Pt advised to arrive 15 mins prior to this appt.   ED precautions provided to the pt if symptoms were to worsen between now and his appt this afternoon with Renee.   Pt is aware that I will share this plan with Dr. Graciela Husbands and Mindi Junker RN.   Pt verbalized understanding and agrees with this plan.  Will send this note to Francis Dowse PA-C and covering CMA, to make them aware of pt scheduled to see them today at 3:10 pm.

## 2023-02-26 ENCOUNTER — Telehealth: Payer: Self-pay | Admitting: *Deleted

## 2023-02-26 ENCOUNTER — Encounter: Payer: Self-pay | Admitting: *Deleted

## 2023-02-26 ENCOUNTER — Telehealth: Payer: Self-pay | Admitting: Physician Assistant

## 2023-02-26 NOTE — Telephone Encounter (Signed)
Called patient ILR reviewed this morning HR remains elevated in a 2:1 AFlutter He denies CP, no dizziness, near syncope or syncope. SOB is the same as it has been for a week Denies any escalation in symptoms Reports compliance with his Eliquis, started Friday PM, has not missed any doses since, taking BID Recommend we proceed with TEE/DCCV Discussed both procedures, potential risks/complications He is agree to proceed Plan for tomorrow  Informed Consent   Shared Decision Making/Informed Consent   The risks [stroke, cardiac arrhythmias rarely resulting in the need for a temporary or permanent pacemaker, skin irritation or burns, esophageal damage, perforation (1:10,000 risk), bleeding, pharyngeal hematoma as well as other potential complications associated with conscious sedation including aspiration, arrhythmia, respiratory failure and death], benefits (treatment guidance, restoration of normal sinus rhythm, diagnostic support) and alternatives of a transesophageal echocardiogram guided cardioversion were discussed in detail with Mr. Koe and he is willing to proceed.      Francis Dowse, PA-C

## 2023-02-26 NOTE — Progress Notes (Signed)
Spoke to patient about arrival time at 51am to [place], meds to take, NPO after MN  and having a responsible person after procedure.

## 2023-02-26 NOTE — Progress Notes (Signed)
[facility] Summary Report / Loop Recorder 

## 2023-02-26 NOTE — Telephone Encounter (Signed)
Spoke with patient aware recommendations for VT( DCCV/TEE). Patient scheduled 12-24 @7 :30 am with Dr Bjorn Pippin.

## 2023-02-27 ENCOUNTER — Ambulatory Visit (HOSPITAL_COMMUNITY)
Admission: RE | Admit: 2023-02-27 | Discharge: 2023-02-27 | Disposition: A | Discharge status: Home / Self Care | Payer: Medicare Other | Attending: Cardiology | Admitting: Cardiology

## 2023-02-27 ENCOUNTER — Other Ambulatory Visit: Payer: Self-pay

## 2023-02-27 ENCOUNTER — Encounter (HOSPITAL_COMMUNITY): Payer: Self-pay | Admitting: Cardiology

## 2023-02-27 ENCOUNTER — Ambulatory Visit (HOSPITAL_BASED_OUTPATIENT_CLINIC_OR_DEPARTMENT_OTHER): Payer: Medicare Other | Admitting: Anesthesiology

## 2023-02-27 ENCOUNTER — Encounter (HOSPITAL_COMMUNITY)
Admission: RE | Disposition: A | Discharge status: Home / Self Care | Payer: Self-pay | Source: Home / Self Care | Attending: Cardiology

## 2023-02-27 ENCOUNTER — Ambulatory Visit (HOSPITAL_COMMUNITY): Payer: Medicare Other | Admitting: Anesthesiology

## 2023-02-27 ENCOUNTER — Ambulatory Visit (HOSPITAL_BASED_OUTPATIENT_CLINIC_OR_DEPARTMENT_OTHER)
Admission: RE | Admit: 2023-02-27 | Discharge: 2023-02-27 | Disposition: A | Discharge status: Home / Self Care | Payer: Medicare Other | Source: Home / Self Care | Attending: Cardiology | Admitting: Cardiology

## 2023-02-27 DIAGNOSIS — Z79899 Other long term (current) drug therapy: Secondary | ICD-10-CM | POA: Insufficient documentation

## 2023-02-27 DIAGNOSIS — E119 Type 2 diabetes mellitus without complications: Secondary | ICD-10-CM | POA: Insufficient documentation

## 2023-02-27 DIAGNOSIS — E785 Hyperlipidemia, unspecified: Secondary | ICD-10-CM | POA: Diagnosis not present

## 2023-02-27 DIAGNOSIS — I4892 Unspecified atrial flutter: Secondary | ICD-10-CM

## 2023-02-27 DIAGNOSIS — I480 Paroxysmal atrial fibrillation: Secondary | ICD-10-CM | POA: Insufficient documentation

## 2023-02-27 DIAGNOSIS — I2510 Atherosclerotic heart disease of native coronary artery without angina pectoris: Secondary | ICD-10-CM

## 2023-02-27 DIAGNOSIS — Z7901 Long term (current) use of anticoagulants: Secondary | ICD-10-CM | POA: Insufficient documentation

## 2023-02-27 DIAGNOSIS — I471 Supraventricular tachycardia, unspecified: Secondary | ICD-10-CM | POA: Insufficient documentation

## 2023-02-27 DIAGNOSIS — Z95818 Presence of other cardiac implants and grafts: Secondary | ICD-10-CM | POA: Insufficient documentation

## 2023-02-27 DIAGNOSIS — J449 Chronic obstructive pulmonary disease, unspecified: Secondary | ICD-10-CM

## 2023-02-27 DIAGNOSIS — Z8673 Personal history of transient ischemic attack (TIA), and cerebral infarction without residual deficits: Secondary | ICD-10-CM | POA: Diagnosis not present

## 2023-02-27 HISTORY — PX: CARDIOVERSION: EP1203

## 2023-02-27 HISTORY — PX: TRANSESOPHAGEAL ECHOCARDIOGRAM (CATH LAB): EP1270

## 2023-02-27 LAB — POCT I-STAT, CHEM 8
BUN: 13 mg/dL (ref 8–23)
Calcium, Ion: 1.2 mmol/L (ref 1.15–1.40)
Chloride: 105 mmol/L (ref 98–111)
Creatinine, Ser: 1.2 mg/dL (ref 0.61–1.24)
Glucose, Bld: 107 mg/dL — ABNORMAL HIGH (ref 70–99)
HCT: 36 % — ABNORMAL LOW (ref 39.0–52.0)
Hemoglobin: 12.2 g/dL — ABNORMAL LOW (ref 13.0–17.0)
Potassium: 4.4 mmol/L (ref 3.5–5.1)
Sodium: 142 mmol/L (ref 135–145)
TCO2: 25 mmol/L (ref 22–32)

## 2023-02-27 LAB — ECHO TEE

## 2023-02-27 SURGERY — TRANSESOPHAGEAL ECHOCARDIOGRAM (TEE) (CATHLAB)
Anesthesia: Monitor Anesthesia Care

## 2023-02-27 MED ORDER — PROPOFOL 10 MG/ML IV BOLUS
INTRAVENOUS | Status: DC | PRN
  Administered 2023-02-27: 30 mg via INTRAVENOUS

## 2023-02-27 MED ORDER — SODIUM CHLORIDE 0.9 % IV SOLN
INTRAVENOUS | Status: DC
  Administered 2023-02-27: 20 mL/h via INTRAVENOUS

## 2023-02-27 MED ORDER — PROPOFOL 500 MG/50ML IV EMUL
INTRAVENOUS | Status: DC | PRN
  Administered 2023-02-27: 50 ug/kg/min via INTRAVENOUS

## 2023-02-27 MED ORDER — PHENYLEPHRINE 80 MCG/ML (10ML) SYRINGE FOR IV PUSH (FOR BLOOD PRESSURE SUPPORT)
PREFILLED_SYRINGE | INTRAVENOUS | Status: DC | PRN
  Administered 2023-02-27: 160 ug via INTRAVENOUS
  Administered 2023-02-27: 80 ug via INTRAVENOUS
  Administered 2023-02-27 (×2): 160 ug via INTRAVENOUS

## 2023-02-27 MED ORDER — LIDOCAINE 2% (20 MG/ML) 5 ML SYRINGE
INTRAMUSCULAR | Status: DC | PRN
  Administered 2023-02-27: 100 mg via INTRAVENOUS

## 2023-02-27 SURGICAL SUPPLY — 1 items: PAD DEFIB RADIO PHYSIO CONN (PAD) ×1 IMPLANT

## 2023-02-27 NOTE — Transfer of Care (Signed)
Immediate Anesthesia Transfer of Care Note  Patient: Alec Maldonado  Procedure(s) Performed: TRANSESOPHAGEAL ECHOCARDIOGRAM CARDIOVERSION  Patient Location: Cath Lab holding   Anesthesia Type:MAC  Level of Consciousness: awake, alert , and oriented  Airway & Oxygen Therapy: Patient Spontanous Breathing and Patient connected to nasal cannula oxygen  Post-op Assessment: Report given to RN and Post -op Vital signs reviewed and stable  Post vital signs: Reviewed and stable  Last Vitals:  Vitals Value Taken Time  BP 90/70 02/27/23 0802  Temp 36.9 C 02/27/23 0801  Pulse 64 02/27/23 0803  Resp 26 02/27/23 0803  SpO2 96 % 02/27/23 0803  Vitals shown include unfiled device data.  Last Pain:  Vitals:   02/27/23 0801  TempSrc: Temporal  PainSc: Asleep         Complications: No notable events documented.

## 2023-02-27 NOTE — Progress Notes (Signed)
Dr. Bjorn Pippin at bedside talking w/patient.

## 2023-02-27 NOTE — CV Procedure (Signed)
   TRANSESOPHAGEAL ECHOCARDIOGRAM GUIDED DIRECT CURRENT CARDIOVERSION  NAME:  Alec Maldonado   MRN: [id number] DOB:  01/05/1951   ADMIT DATE: 02/27/2023  INDICATIONS: Symptomatic atrial fibrillation  PROCEDURE:   Informed consent was obtained prior to the procedure. The risks, benefits and alternatives for the procedure were discussed and the patient comprehended these risks.  Risks include, but are not limited to, cough, sore throat, vomiting, nausea, somnolence, esophageal and stomach trauma or perforation, bleeding, low blood pressure, aspiration, pneumonia, infection, trauma to the teeth and death.    After a procedural time-out, the oropharynx was anesthetized and the patient was sedated by the anesthesia service. The transesophageal probe was inserted in the esophagus and stomach without difficulty and multiple views were obtained. Anesthesia was monitored by Allyn Kenner, CRNA.   COMPLICATIONS:    Complications: No complications Patient tolerated procedure well.  FINDINGS:  No LAA thrombus.  EF 40-45%   CARDIOVERSION:     Indications:  Symptomatic Atrial Fibrillation  Procedure Details:  Once the TEE was complete, the patient had the defibrillator pads placed in the anterior and posterior position. Once an appropriate level of sedation was confirmed, the patient was cardioverted x 1 with 200J of biphasic synchronized energy.  The patient converted to NSR.  There were no apparent complications.  The patient had normal neuro status and respiratory status post procedure with vitals stable as recorded elsewhere.  Adequate airway was maintained throughout and vital signs monitored per protocol.  Epifanio Lesches MD [facility]  [address] [place], [place] [address] [phone]   12:08 PM

## 2023-02-27 NOTE — Anesthesia Preprocedure Evaluation (Signed)
Anesthesia Evaluation  Patient identified by MRN, date of birth, ID band Patient awake    Reviewed: Allergy & Precautions, NPO status , Patient's Chart, lab work & pertinent test results  History of Anesthesia Complications Negative for: history of anesthetic complications  Airway Mallampati: III  TM Distance: >3 FB Neck ROM: Full    Dental  (+) Teeth Intact, Dental Advisory Given   Pulmonary shortness of breath, neg sleep apnea, neg COPD, neg recent URI   breath sounds clear to auscultation       Cardiovascular (-) angina + CAD  + dysrhythmias Atrial Fibrillation + Valvular Problems/Murmurs  Rhythm:Regular Rate:Tachycardia    1. Left ventricular ejection fraction, by estimation, is 50 to 55%. The  left ventricle has low normal function. The left ventricle has no regional  wall motion abnormalities.   2. Right ventricular systolic function is normal. The right ventricular  size is normal.   3. Left atrial size was severely dilated. No left atrial/left atrial  appendage thrombus was detected.   4. S/P MV repair. The MV is degenerative with thickened and calcified  leaflets. There is some restricted leaflet motion but no mitral stenosis  with mean MVG of . There is mild mitral regurgitation.   5. The aortic valve is tricuspid. Aortic valve regurgitation is mild.  Mild aortic valve sclerosis is present, with no evidence of aortic valve  stenosis.   6. The inferior vena cava is normal in size with greater than 50%  respiratory variability, suggesting right atrial pressure of 3 mmHg.   7. The interatrial septum appears to be lipomatous. No atrial level shunt  detected by color flow Doppler. Agitated saline contrast was given  intravenously to evaluate for intracardiac shunting. There is no evidence  of a patent foramen ovale. There is no   evidence of an atrial septal defect.     Neuro/Psych  Headaches Left eye stroke   Neuromuscular disease CVA  negative psych ROS   GI/Hepatic negative GI ROS, Neg liver ROS,,,  Endo/Other  negative endocrine ROS    Renal/GU negative Renal ROS     Musculoskeletal  (+) Arthritis ,    Abdominal   Peds  Hematology  (+) Blood dyscrasia Lab Results      Component                Value               Date                      WBC                      7.4                 06/19/2022                HGB                      12.2 (L)            02/27/2023                HCT                      36.0 (L)            02/27/2023                MCV  83.1                06/19/2022                PLT                      267.0               06/19/2022            eliquis   Anesthesia Other Findings   Reproductive/Obstetrics                              Anesthesia Physical Anesthesia Plan  ASA: 3  Anesthesia Plan: MAC and General   Post-op Pain Management: Minimal or no pain anticipated   Induction: Intravenous  PONV Risk Score and Plan: 2 and Propofol infusion and Treatment may vary due to age or medical condition  Airway Management Planned: Nasal Cannula, Natural Airway and Simple Face Mask  Additional Equipment: None  Intra-op Plan:   Post-operative Plan:   Informed Consent: I have reviewed the patients History and Physical, chart, labs and discussed the procedure including the risks, benefits and alternatives for the proposed anesthesia with the patient or authorized representative who has indicated his/her understanding and acceptance.     Dental advisory given  Plan Discussed with: CRNA  Anesthesia Plan Comments:          Anesthesia Quick Evaluation

## 2023-02-27 NOTE — Interval H&P Note (Signed)
History and Physical Interval Note:  02/27/2023 8:30 AM  Alec Maldonado  has presented today for surgery, with the diagnosis of aflutter.  The various methods of treatment have been discussed with the patient and family. After consideration of risks, benefits and other options for treatment, the patient has consented to  Procedure(s): TRANSESOPHAGEAL ECHOCARDIOGRAM (N/A) CARDIOVERSION (N/A) as a surgical intervention.  The patient's history has been reviewed, patient examined, no change in status, stable for surgery.  I have reviewed the patient's chart and labs.  Questions were answered to the patient's satisfaction.     Alec Maldonado

## 2023-03-02 ENCOUNTER — Telehealth: Payer: Medicare Other | Admitting: General Practice

## 2023-03-02 ENCOUNTER — Telehealth (INDEPENDENT_AMBULATORY_CARE_PROVIDER_SITE_OTHER): Payer: Medicare Other | Admitting: Primary Care

## 2023-03-02 ENCOUNTER — Encounter: Payer: Self-pay | Admitting: Primary Care

## 2023-03-02 DIAGNOSIS — R051 Acute cough: Secondary | ICD-10-CM | POA: Diagnosis not present

## 2023-03-02 MED ORDER — HYDROCOD POLI-CHLORPHE POLI ER 10-8 MG/5ML PO SUER
5.0000 mL | Freq: Two times a day (BID) | ORAL | 0 refills | Status: DC | PRN
Start: 2023-03-02 — End: 2023-04-20

## 2023-03-02 MED ORDER — PREDNISONE 20 MG PO TABS: ORAL_TABLET | ORAL | 0 refills | Status: DC

## 2023-03-02 NOTE — Telephone Encounter (Signed)
Seen by RU now on anticoagulation and scheduled for cardioversion  Thanks SK

## 2023-03-02 NOTE — Patient Instructions (Signed)
Start prednisone 20 mg tablets. Take 2 tablets by mouth once daily in the morning for 5 days.  You may take the cough suppressant every 12hours as needed for cough and rest. Caution this medication contains codeine which may cause drowsiness.   Start omeprazole 20 mg every evening with dinner for your recurrent cough.   It was a pleasure meeting you!

## 2023-03-02 NOTE — Assessment & Plan Note (Addendum)
Based on HPI and presentation, low suspicion for infectious cause. Exam is limited given virtual visit. He refused to come to the office.  Question if he has silent reflux, discussed this today. Also consider CHF. Reviewed chest xray from 2023 which does show enlarged cardiac silhouette. Recommend BNP.    Regardless, will trial prednisone burst and Tussionex as this has historically helped. Discussed that this treatment is likely temporary, and that he should try omeprazole 20 mg every evening with dinner.  Start prednisone 20 mg tablets. Take 2 tablets by mouth once daily in the morning for 5 days. Start Tussionex every 12 hours as needed for cough and rest. Caution this medication contains codeine which may cause drowsiness.  Start omeprazole 20 mg every evening with dinner.  Follow up with PCP if no improvement.

## 2023-03-02 NOTE — Progress Notes (Signed)
Patient ID: Alec Maldonado, male    DOB: [birth year], 72 y.o.   MRN: [id number]  Virtual visit completed through [employer], a video enabled telemedicine application. Due to national recommendations of social distancing due to COVID-19, a virtual visit is felt to be most appropriate for this patient at this time. Reviewed limitations, risks, security and privacy concerns of performing a virtual visit and the availability of in person appointments. I also reviewed that there may be a patient responsible charge related to this service. The patient agreed to proceed.   Patient location: home Provider location: [place] [facility], office Persons participating in this virtual visit: patient, provider   If any vitals were documented, they were collected by patient at home unless specified below.    There were no vitals taken for this visit.   CC: Acute Cough Subjective:   HPI: Alec Maldonado is a 72 y.o. male patient of Dr. Sharen Hones with a history of mitral regurgitation, aortic insufficiency, URI, Covid-19 infection, prediabetes, CVA, hyperlipidemia presenting on 03/02/2023 for Cough (X2 weeks cough, worse at night, coughing up clear phlegm/Denies wheezing, SOB, fever)  Symptom onset two weeks ago with cough. His cough is productive with clear sputum. His cough is worse at night.   He denies shortness of breath, fevers, chills, body aches, esophageal burning, belching, rhinorrhea, fatigue. He's feeling well overall. He's been taking Dayquil without improvement in cough.   He has not taken a home Covid-19 test, but he took a Covid-19 injection on 02/16/23. He's been getting this recurrent cough several times annually, typically treated with prednisone and Tussionex which works temporarily. He is a non smoker.      Relevant past medical, surgical, family and social history reviewed and updated as indicated. Interim medical history since our last visit reviewed. Allergies and medications  reviewed and updated. Outpatient Medications Prior to Visit  Medication Sig Dispense Refill   acetaminophen (TYLENOL) 500 MG tablet Take 1,000 mg by mouth every 8 (eight) hours as needed for moderate pain (pain score 4-6).     apixaban (ELIQUIS) 5 MG TABS tablet Take 1 tablet (5 mg total) by mouth 2 (two) times daily.     aspirin 81 MG EC tablet Take 1 tablet (81 mg total) by mouth daily. Swallow whole. 30 tablet 12   butalbital-acetaminophen-caffeine (FIORICET) 50-325-40 MG tablet Take 1 tablet by mouth 3 (three) times daily as needed for migraine. 20 tablet 0   carvedilol (COREG) 12.5 MG tablet Take 1 tablet (12.5 mg total) by mouth 2 (two) times daily with a meal. 180 tablet 3   ezetimibe (ZETIA) 10 MG tablet TAKE 1 TABLET(10 MG) BY MOUTH DAILY 90 tablet 4   finasteride (PROSCAR) 5 MG tablet Take 1 tablet (5 mg total) by mouth daily. 90 tablet 4   Omega-3 Fatty Acids (FISH OIL) 1200 MG CAPS Take 1,200 mg by mouth daily.     simvastatin (ZOCOR) 80 MG tablet Take 1 tablet (80 mg total) by mouth daily at 6 PM. 90 tablet 4   tamsulosin (FLOMAX) 0.4 MG CAPS capsule Take 2 capsules (0.8 mg total) by mouth at bedtime. 180 capsule 4   chlorpheniramine-HYDROcodone (TUSSIONEX) 10-8 MG/5ML Take 5 mLs by mouth every 12 (twelve) hours as needed for cough. (Patient not taking: Reported on 03/02/2023)     No facility-administered medications prior to visit.     Per HPI unless specifically indicated in ROS section below Review of Systems  Constitutional:  Negative for chills, fatigue  and fever.  HENT:  Negative for congestion, postnasal drip, sinus pressure and sore throat.   Respiratory:  Positive for cough. Negative for shortness of breath.   Cardiovascular:  Negative for chest pain.  Neurological:  Negative for headaches.   Objective:  There were no vitals taken for this visit.  Wt Readings from Last 3 Encounters:  02/23/23 247 lb 3.2 oz (112.1 kg)  10/13/22 240 lb (108.9 kg)  06/23/22 241 lb  (109.3 kg)       Physical exam: General: Alert and oriented x 3, no distress, does not appear sickly  Pulmonary: Speaks in complete sentences without increased work of breathing, no cough during visit.  Psychiatric: Normal mood, thought content, and behavior.     Results for orders placed or performed during the hospital encounter of 02/27/23  I-STAT, chem 8   Collection Time: 02/27/23  6:44 AM  Result Value Ref Range   Sodium 142 135 - 145 mmol/L   Potassium 4.4 3.5 - 5.1 mmol/L   Chloride 105 98 - 111 mmol/L   BUN 13 8 - 23 mg/dL   Creatinine, Ser 2.53 0.61 - 1.24 mg/dL   Glucose, Bld 664 (H) 70 - 99 mg/dL   Calcium, Ion 4.03 1.15 - 1.40 mmol/L   TCO2 25 22 - 32 mmol/L   Hemoglobin 12.2 (L) 13.0 - 17.0 g/dL   HCT 47.4 (L) 25.9 - 56.3 %  ECHO TEE   Collection Time: 02/27/23  8:11 AM  Result Value Ref Range   Est EF 40 - 45%    Assessment & Plan:   Problem List Items Addressed This Visit       Other   Acute cough - Primary   Based on HPI and presentation, low suspicion for infectious cause. Exam is limited given virtual visit. He refused to come to the office.  Question if he has silent reflux, discussed this today. Also consider CHF. Reviewed chest xray from 2023 which does show enlarged cardiac silhouette. Recommend BNP.    Regardless, will trial prednisone burst and Tussionex as this has historically helped. Discussed that this treatment is likely temporary, and that he should try omeprazole 20 mg every evening with dinner.  Start prednisone 20 mg tablets. Take 2 tablets by mouth once daily in the morning for 5 days. Start Tussionex every 12 hours as needed for cough and rest. Caution this medication contains codeine which may cause drowsiness.  Start omeprazole 20 mg every evening with dinner.  Follow up with PCP if no improvement.       Relevant Medications   predniSONE (DELTASONE) 20 MG tablet   chlorpheniramine-HYDROcodone (TUSSIONEX) 10-8 MG/5ML      Meds ordered this encounter  Medications   predniSONE (DELTASONE) 20 MG tablet    Sig: Take 2 tablets by mouth once daily for 5 days.    Dispense:  10 tablet    Refill:  0    Supervising Provider:   BEDSOLE, AMY E [[id number]]   chlorpheniramine-HYDROcodone (TUSSIONEX) 10-8 MG/5ML    Sig: Take 5 mLs by mouth every 12 (twelve) hours as needed.    Dispense:  50 mL    Refill:  0    Supervising Provider:   BEDSOLE, AMY E [[id number]]   No orders of the defined types were placed in this encounter.   I discussed the assessment and treatment plan with the patient. The patient was provided an opportunity to ask questions and all were answered. The patient agreed with the plan  and demonstrated an understanding of the instructions. The patient was advised to call back or seek an in-person evaluation if the symptoms worsen or if the condition fails to improve as anticipated.  Follow up plan:  Start prednisone 20 mg tablets. Take 2 tablets by mouth once daily in the morning for 5 days.  You may take the cough suppressant every 12hours as needed for cough and rest. Caution this medication contains codeine which may cause drowsiness.   Start omeprazole 20 mg every evening with dinner for your recurrent cough.   It was a pleasure meeting you!   Doreene Nest, NP

## 2023-03-04 NOTE — Anesthesia Postprocedure Evaluation (Signed)
Anesthesia Post Note  Patient: Alec Maldonado  Procedure(s) Performed: TRANSESOPHAGEAL ECHOCARDIOGRAM CARDIOVERSION     Patient location during evaluation: Cath Lab Anesthesia Type: MAC Level of consciousness: awake and alert Pain management: pain level controlled Vital Signs Assessment: post-procedure vital signs reviewed and stable Respiratory status: spontaneous breathing, nonlabored ventilation and respiratory function stable Cardiovascular status: stable and blood pressure returned to baseline Postop Assessment: no apparent nausea or vomiting Anesthetic complications: no   There were no known notable events for this encounter.  Last Vitals:  Vitals:   02/27/23 0835 02/27/23 0837  BP: 92/64 92/64  Pulse: 67 69  Resp: (!) 21 20  Temp:  36.7 C  SpO2: 94% 95%    Last Pain:  Vitals:   02/27/23 0837  TempSrc: Temporal  PainSc: 0-No pain   Pain Goal:                   Alec Maldonado

## 2023-03-05 ENCOUNTER — Ambulatory Visit (INDEPENDENT_AMBULATORY_CARE_PROVIDER_SITE_OTHER): Payer: Medicare Other

## 2023-03-05 DIAGNOSIS — I63449 Cerebral infarction due to embolism of unspecified cerebellar artery: Secondary | ICD-10-CM | POA: Diagnosis not present

## 2023-03-05 LAB — CUP PACEART REMOTE DEVICE CHECK
Date Time Interrogation Session: 20241227230400
Implantable Pulse Generator Implant Date: 20210414

## 2023-04-03 ENCOUNTER — Telehealth: Payer: Self-pay | Admitting: Physician Assistant

## 2023-04-03 NOTE — Telephone Encounter (Signed)
Pt is requesting a callback regarding his most recent office visit. Please advise

## 2023-04-03 NOTE — Telephone Encounter (Signed)
Spoke with patient and he would like to know if follow up is really needed. He was supposed to follow up this month but feels he doesn't need it. He also want to know when he can stop elquis. Did inform patient he should schedule follow up but he wants to see what provider says.

## 2023-04-04 ENCOUNTER — Encounter: Payer: Self-pay | Admitting: Internal Medicine

## 2023-04-06 NOTE — Telephone Encounter (Signed)
Spoke with patient and he states he will think about it and call us back. I did inform patient the importance of follow up and to continue taking his Eliquis.  He asked about watch man because he saw that on TV. Informed he will need to discuss that with provider. Tried to set up appointment but he states he will call us back.

## 2023-04-09 ENCOUNTER — Ambulatory Visit: Payer: Medicare Other

## 2023-04-09 DIAGNOSIS — I63449 Cerebral infarction due to embolism of unspecified cerebellar artery: Secondary | ICD-10-CM | POA: Diagnosis not present

## 2023-04-10 LAB — CUP PACEART REMOTE DEVICE CHECK
Date Time Interrogation Session: 20250131230138
Implantable Pulse Generator Implant Date: 20210414

## 2023-04-19 ENCOUNTER — Ambulatory Visit: Payer: Self-pay | Admitting: Family Medicine

## 2023-04-19 NOTE — Telephone Encounter (Signed)
Noted

## 2023-04-19 NOTE — Telephone Encounter (Signed)
Copied from CRM [id number]. Topic: Clinical - Red Word Triage >> Apr 19, 2023  3:51 PM Sim Boast F wrote: Red Word that prompted transfer to Nurse Triage: Patient says he has a severe cough   Chief Complaint: cough x 2 months Symptoms: clear mucus Frequency: constant Pertinent Negatives: Patient denies fever, runny nose, chest pain Disposition: [] ED /[] Urgent Care (no appt availability in office) / [x] Appointment(In office/virtual)/ []  [facility]/ [] Home Care/ [] Refused Recommended Disposition /[] [facility]/ []  Follow-up with PCP Additional Notes: Pt reports cough x 2 months, seen in December for the same and prescribed prednisone and tuccinex. Pt sts the medication helped a little, but the cough still lingers. Appt scheduled for 0830 on 2/14  Reason for Disposition  Cough has been present for > 3 weeks  Answer Assessment - Initial Assessment Questions 1. ONSET: "When did the cough begin?"      Started 2 months  2. SEVERITY: "How bad is the cough today?"      Lingering  3. SPUTUM: "Describe the color of your sputum" (none, dry cough; clear, white, yellow, green)     Clear  4. HEMOPTYSIS: "Are you coughing up any blood?" If so ask: "How much?" (flecks, streaks, tablespoons, etc.)     No  5. DIFFICULTY BREATHING: "Are you having difficulty breathing?" If Yes, ask: "How bad is it?" (e.g., mild, moderate, severe)    - MILD: No SOB at rest, mild SOB with walking, speaks normally in sentences, can lie down, no retractions, pulse < 100.    - MODERATE: SOB at rest, SOB with minimal exertion and prefers to sit, cannot lie down flat, speaks in phrases, mild retractions, audible wheezing, pulse 100-120.    - SEVERE: Very SOB at rest, speaks in single words, struggling to breathe, sitting hunched forward, retractions, pulse > 120      No  6. FEVER: "Do you have a fever?" If Yes, ask: "What is your temperature, how was it measured, and when did it start?"     No  7.  CARDIAC HISTORY: "Do you have any history of heart disease?" (e.g., heart attack, congestive heart failure)      Atrial Fib  8. LUNG HISTORY: "Do you have any history of lung disease?"  (e.g., pulmonary embolus, asthma, emphysema)     No  9. PE RISK FACTORS: "Do you have a history of blood clots?" (or: recent major surgery, recent prolonged travel, bedridden)     No  10. OTHER SYMPTOMS: "Do you have any other symptoms?" (e.g., runny nose, wheezing, chest pain)       No  11. PREGNANCY: "Is there any chance you are pregnant?" "When was your last menstrual period?"       N/A  12. TRAVEL: "Have you traveled out of the country in the last month?" (e.g., travel history, exposures)       No; unsure of exposure  Protocols used: Cough - Acute Productive-A-AH

## 2023-04-20 ENCOUNTER — Encounter: Payer: Self-pay | Admitting: Family Medicine

## 2023-04-20 ENCOUNTER — Ambulatory Visit (INDEPENDENT_AMBULATORY_CARE_PROVIDER_SITE_OTHER): Payer: Medicare Other | Admitting: Family Medicine

## 2023-04-20 ENCOUNTER — Ambulatory Visit (INDEPENDENT_AMBULATORY_CARE_PROVIDER_SITE_OTHER)
Admission: RE | Admit: 2023-04-20 | Discharge: 2023-04-20 | Disposition: A | Discharge status: Home / Self Care | Payer: Medicare Other | Source: Ambulatory Visit | Attending: Family Medicine | Admitting: Family Medicine

## 2023-04-20 VITALS — BP 124/72 | HR 62 | Temp 98.0°F | Ht 70.5 in | Wt 242.4 lb

## 2023-04-20 DIAGNOSIS — R053 Chronic cough: Secondary | ICD-10-CM | POA: Insufficient documentation

## 2023-04-20 DIAGNOSIS — R918 Other nonspecific abnormal finding of lung field: Secondary | ICD-10-CM | POA: Diagnosis not present

## 2023-04-20 DIAGNOSIS — R052 Subacute cough: Secondary | ICD-10-CM | POA: Insufficient documentation

## 2023-04-20 MED ORDER — AMOXICILLIN-POT CLAVULANATE 875-125 MG PO TABS: 1.0000 | ORAL_TABLET | Freq: Two times a day (BID) | ORAL | 0 refills | Status: DC

## 2023-04-20 NOTE — Assessment & Plan Note (Addendum)
2 months of cough (unsure how it started) Worsening  Occational productive  Reviewed recent notes and plan from NP [place] In pt with a fib and right ventricular dysfunction  Exam overall reassuring- few rhonchi anteriorly  Per pt prednisone and omeprazole and tessalon did not help in past Tussionex helped but very sedating  Suspect post viral cough syndrome/cyclic cough Cxr ordered and notes mid lung base opacities (could be atelectasis but cannot r/o infiltrate)  Will treatment for CAP and follow up with pcp Will cover empirically with augmentin (hesitant to start macrolide in light of arrythmia and potential for QT changes)

## 2023-04-20 NOTE — Progress Notes (Signed)
 Subjective:    Patient ID: Alec Maldonado, male    DOB: 09-30-1950, 73 y.o.   MRN: [id number]  HPI  Wt Readings from Last 3 Encounters:  04/20/23 242 lb 6 oz (109.9 kg)  02/23/23 247 lb 3.2 oz (112.1 kg)  10/13/22 240 lb (108.9 kg)   34.29 kg/m  Vitals:   04/20/23 0820  BP: 124/72  Pulse: 62  Temp: 98 F (36.7 C)  SpO2: 96%   73 yo pt of Dr Reece Agar presents for cough for 2 months   Saw NP Clark in late dec for video visit  Prednisone Omeprazole Tussionex    It helped just a little bit  Tussionex sedating   Cough is both dry and productive -clear to yellow No wheezing  His shortness of breath may be baseline   No fever  No sore throat  No nasal symptoms   No n/v   No history of asthma or copd    Over the counter Day quil  Nyquil  Vics  Albuterol does not help   Tried the tessalon pearles -no help   CXR  DG Chest 2 View Result Date: 04/20/2023 CLINICAL DATA:  Subacute cough for 2 months. EXAM: CHEST - 2 VIEW COMPARISON:  X-ray 06/27/2021 and older FINDINGS: Sternal wires. Loop recorder. Stable cardiopericardial silhouette. Slight opacity at both lung bases. Atelectasis is favored over infiltrate but recommend follow-up. No pneumothorax or effusion. No edema. Degenerative changes of the spine on lateral view. IMPRESSION: Mild lung base opacities. Atelectasis is favored but recommend follow-up. Postop chest.  Loop recorder. Electronically Signed   By: Karen Kays M.D.   On: 04/20/2023 10:04   CUP PACEART REMOTE DEVICE CHECK Result Date: 04/10/2023 ILR summary report received. Battery status OK. Normal device function. No new symptom, tachy, brady, or pause episodes. 31 new AF episodes, overall controlled rates, burden 0.8%, Eliquis per PA report.  PVC's=7.6%.  Monthly summary reports and ROV/PRN LA, CV     Patient Active Problem List   Diagnosis Date Noted   Subacute cough 04/20/2023   Atrial flutter (HCC) 02/27/2023   COVID-19 10/13/2022   Corns and callus  06/19/2022   Prediabetes 06/13/2021   PVC (premature ventricular contraction) 12/29/2020   Status post total right knee replacement 07/13/2020   Pre-op evaluation 05/18/2020   Bilateral primary osteoarthritis of knee 02/11/2020   Posterior vitreous detachment, both eyes 01/13/2020   Perianal rash 10/19/2019   LVH (left ventricular hypertrophy) 08/10/2019   Central retinal artery occlusion of left eye 06/16/2019   History 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 06/16/2019   Obesity, Class I, BMI 30-34.9 06/16/2019   Sciatica, right side 05/06/2019   Acute cough 03/14/2016   Medicare annual wellness visit, subsequent 02/23/2015   Advanced care planning/counseling discussion 02/23/2015   Health maintenance examination 08/28/2013   Acute respiratory infection 05/12/2013   Paroxysmal SVT (supraventricular tachycardia) (HCC) 03/11/2013   Aortic insufficiency    Right ventricular dysfunction    RBBB    Status post mitral valve repair    Chest discomfort    Mitral regurgitation    Migraines    Dyslipidemia 12/13/2008   Benign prostatic hyperplasia with lower urinary tract symptoms 05/10/2007   Past Medical History:  Diagnosis Date   Allergy    Aortic insufficiency    Mild, echo, January, 2014   Basal cell carcinoma (BCC) of multiple sites 2023   R lateral upper back, l ear antihelix (Dr Yetta Barre)  BCC (basal cell carcinoma), back 2017   s/p excisional biospy by derm   BPH (benign prostatic hypertrophy)    Central retinal artery occlusion of left eye 06/16/2019   S/p ER eval 06/2019 Saw neurology Pearlean Brownie) - left eye vision loss due to central retinal artery occlusion in April 2021, MRI scan showing silent cerebral embolic infarcts of cryptogenic etiology   Chest discomfort    nuclear 06/24/09, suggestion prior small inferior MI with moderate peri-infarvt ischemia versus variable diaphragmatic attenuation   Colon polyps    colonoscopy 07/02/06,  repeat in 10 years   Coronary artery disease    Dyslipidemia    Dysplastic nevus of trunk 08/20/2012   Dysrhythmia    supraventricular tachycardia, paplitations   Ejection fraction    EF 55%, echo, October, 2010  //   EF 45-50%,  catheterization, 2011   Heart murmur    History 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 TEE 06/2019 IMPRESSION: 1. Left ventricular ejection fraction, by estimation, is 50 to 55%. The left ventricle has low normal function. The left ventricle has no regional wall motion abnormalities. There is severe asymmetric left ventricular hypertrophy 2. Right ventricular systolic function is normal.   Hyperlipidemia    Migraines    rare   Mitral regurgitation    Mitral valve repair 2001   Neurofibroma of foot 04/2017   s/p removal by derm   Palpitations 07/2009    helped with carvedilol    Pleurisy    Postural dizziness    mild, no orthostatic changes by blood pressure check   Pre-diabetes    Pre-syncope    January, 2014   Right ventricular dysfunction    Mild/moderate right ventricular dysfunction by echo, January, 2014, no tricuspid regurgitation, therefore right heart pressure could not be estimated.   Shortness of breath    Status post mitral valve repair    2001,  //  echo, October, 2010, good function of the repaired mitral valve   Stroke [facility]) 2021   lt eye    SVT (supraventricular tachycardia) (HCC) 03/11/2013   Isolated episodes - last 03/2013 after flu treated with adenosine cardioversion in ER.    Thyroid disease    Past Surgical History:  Procedure Laterality Date   aneurysmal dilatation     proximal LAD cath 2001, reoeat catheter 2004, no significant change   aneurysmal dilatiation  07/15/09   no change, normal right heart filling pressures and LVEDP- EF 45-50%   APPENDECTOMY  1970   BUBBLE STUDY   06/18/2019   Procedure: BUBBLE STUDY;  Surgeon: Quintella Reichert, MD;  Location: [facility] ENDOSCOPY;  Service: Cardiovascular;;   CARDIAC CATHETERIZATION  2010   mild global LV dysfunction EF 45-50%, no significant CAD obstruction, aneurysmal dilatation LAD   CARDIOVERSION N/A 02/27/2023   Procedure: CARDIOVERSION;  Surgeon: Little Ishikawa, MD;  Location: [facility] INVASIVE CV LAB;  Service: Cardiovascular;  Laterality: N/A;   COLONOSCOPY  2008   COLONOSCOPY  06/2016   diverticulosis, rpt 10 yrs Christella Hartigan)   CORONARY ANGIOPLASTY  09/1999   severe MR o/w normal limits   CORONARY ANGIOPLASTY  12/19/02   pos. CAD, mild LV dysfunction- med treatment   EYE SURGERY Left    HERNIA REPAIR  01/11/05   left, Dr. Daphine Deutscher   I & D EXTREMITY Right 02/04/2015   Procedure: IRRIGATION AND DEBRIDEMENT RIGHT INDEX FINGER  OPEN FRACTURE AND REPAIR DIGITAL NERVE AND ARTERY;  Surgeon: Betha Loa, MD;  Location: [facility] OR;  Service: Orthopedics;  Laterality: Right;   LOOP RECORDER INSERTION N/A 06/18/2019   Procedure: LOOP RECORDER INSERTION;  Surgeon: Duke Salvia, MD;  Location: [facility] INVASIVE CV LAB;  Service: Cardiovascular;  Laterality: N/A;   MITRAL VALVE REPAIR  01/22/00   good mitral valve function- echo 12/2008- EF 55% / nuclear 06/2009- EF 36%,? reliable? / EF 45-50%, cath rated 02/2010   ORIF ELBOW FRACTURE     left   POLYPECTOMY     TEE WITHOUT CARDIOVERSION N/A 06/18/2019   Procedure: TRANSESOPHAGEAL ECHOCARDIOGRAM (TEE);  Surgeon: Quintella Reichert, MD;  Location: [facility] ENDOSCOPY;  Service: Cardiovascular;  Laterality: N/A;   THYROID SURGERY     THYROIDECTOMY     left, due to goiter 1994   TOTAL KNEE ARTHROPLASTY Right 06/29/2020   Procedure: RIGHT TOTAL KNEE ARTHROPLASTY;  Surgeon: Valeria Batman, MD;  Location: [facility] ORS;  Service: Orthopedics;  Laterality: Right;   TRANSESOPHAGEAL ECHOCARDIOGRAM (CATH LAB) N/A 02/27/2023   Procedure: TRANSESOPHAGEAL ECHOCARDIOGRAM;  Surgeon: Little Ishikawa, MD;   Location: [facility] INVASIVE CV LAB;  Service: Cardiovascular;  Laterality: N/A;   Social History   Tobacco Use   Smoking status: Never   Smokeless tobacco: Never  Vaping Use   Vaping status: Never Used  Substance Use Topics   Alcohol use: No   Drug use: No   Family History  Problem Relation Age of Onset   Alzheimer's disease Mother    Heart disease Mother        CHF   Arthritis Father    Heart disease Father        leaking mitral valve   Benign prostatic hyperplasia Father    Heart disease Brother        murmur   Hypertension Neg Hx    Diabetes Neg Hx    Depression Neg Hx    Alcohol abuse Neg Hx    Drug abuse Neg Hx    Stroke Neg Hx    Cancer Neg Hx    Colon cancer Neg Hx    Esophageal cancer Neg Hx    Rectal cancer Neg Hx    Stomach cancer Neg Hx    No Known Allergies Current Outpatient Medications on File Prior to Visit  Medication Sig Dispense Refill   acetaminophen (TYLENOL) 500 MG tablet Take 1,000 mg by mouth every 8 (eight) hours as needed for moderate pain (pain score 4-6).     apixaban (ELIQUIS) 5 MG TABS tablet Take 1 tablet (5 mg total) by mouth 2 (two) times daily.     aspirin 81 MG EC tablet Take 1 tablet (81 mg total) by mouth daily. Swallow whole. 30 tablet 12   butalbital-acetaminophen-caffeine (FIORICET) 50-325-40 MG tablet Take 1 tablet by mouth 3 (three) times daily as needed for migraine. 20 tablet 0   carvedilol (COREG) 12.5 MG tablet Take 1 tablet (12.5 mg total) by mouth 2 (two) times daily with a meal. 180 tablet 3   ezetimibe (ZETIA) 10 MG tablet TAKE 1 TABLET(10 MG) BY MOUTH DAILY 90 tablet 4   finasteride (PROSCAR) 5 MG tablet Take 1 tablet (5 mg total) by mouth daily. 90 tablet 4   Omega-3 Fatty Acids (FISH OIL) 1200 MG CAPS Take 1,200 mg by mouth daily.     simvastatin (ZOCOR) 80 MG tablet Take 1 tablet (80 mg total) by mouth daily at 6 PM. 90 tablet 4  tamsulosin (FLOMAX) 0.4 MG CAPS capsule Take 2 capsules (0.8 mg total) by mouth at bedtime.  180 capsule 4   No current facility-administered medications on file prior to visit.    Review of Systems  Constitutional:  Negative for activity change, appetite change, fatigue, fever and unexpected weight change.  HENT:  Negative for congestion, rhinorrhea, sore throat and trouble swallowing.   Eyes:  Negative for pain, redness, itching and visual disturbance.  Respiratory:  Positive for cough. Negative for choking, chest tightness, shortness of breath, wheezing and stridor.   Cardiovascular:  Negative for chest pain and palpitations.  Gastrointestinal:  Negative for abdominal pain, blood in stool, constipation, diarrhea and nausea.  Endocrine: Negative for cold intolerance, heat intolerance, polydipsia and polyuria.  Genitourinary:  Negative for difficulty urinating, dysuria, frequency and urgency.  Musculoskeletal:  Negative for arthralgias, joint swelling and myalgias.  Skin:  Negative for pallor and rash.  Neurological:  Negative for dizziness, tremors, weakness, numbness and headaches.  Hematological:  Negative for adenopathy. Does not bruise/bleed easily.  Psychiatric/Behavioral:  Negative for decreased concentration and dysphoric mood. The patient is not nervous/anxious.        Objective:   Physical Exam Constitutional:      General: He is not in acute distress.    Appearance: Normal appearance. He is well-developed. He is not ill-appearing or diaphoretic.  HENT:     Head: Normocephalic and atraumatic.  Eyes:     Conjunctiva/sclera: Conjunctivae normal.     Pupils: Pupils are equal, round, and reactive to light.  Neck:     Thyroid: No thyromegaly.     Vascular: No carotid bruit or JVD.  Cardiovascular:     Rate and Rhythm: Normal rate and regular rhythm.     Heart sounds: Normal heart sounds.     No gallop.  Pulmonary:     Effort: Pulmonary effort is normal. No respiratory distress.     Breath sounds: No stridor. Rhonchi present. No wheezing or rales.     Comments:  Few scant rhonchi anteriorly  No wheeze No prolonged exp phase  Cough sounds dry and hacking   Abdominal:     General: There is no distension or abdominal bruit.     Palpations: Abdomen is soft.     Tenderness: There is no abdominal tenderness.     Comments: Protuberant abdomen  No tenderness  Musculoskeletal:     Cervical back: Normal range of motion and neck supple.     Right lower leg: No edema.     Left lower leg: No edema.  Lymphadenopathy:     Cervical: No cervical adenopathy.  Skin:    General: Skin is warm and dry.     Coloration: Skin is not pale.     Findings: No rash.  Neurological:     Mental Status: He is alert.     Coordination: Coordination normal.     Deep Tendon Reflexes: Reflexes are normal and symmetric. Reflexes normal.  Psychiatric:        Mood and Affect: Mood normal.           Assessment & Plan:   Problem List Items Addressed This Visit       Other   Subacute cough - Primary   2 months of cough (unsure how it started) Worsening  Occational productive  Reviewed recent notes and plan from NP Clark In pt with a fib and right ventricular dysfunction  Exam overall reassuring- few rhonchi anteriorly  Per pt prednisone  and omeprazole and tessalon did not help in past Tussionex helped but very sedating  Suspect post viral cough syndrome/cyclic cough Cxr ordered and notes mid lung base opacities (could be atelectasis but cannot r/o infiltrate)  Will treatment for CAP and follow up with pcp Will cover empirically with augmentin (hesitant to start macrolide in light of arrythmia and potential for QT changes)         Relevant Orders   DG Chest 2 View (Completed)

## 2023-04-20 NOTE — Patient Instructions (Addendum)
Drink lots of water /fluids   Chest xray now   You may have a post viral cough syndrome  Want to rule out pneumonia   Once we get a result I will make a plan    If symptoms suddenly worsen or if you have trouble breathing -let us know  (If severe let us know)

## 2023-04-30 ENCOUNTER — Ambulatory Visit (INDEPENDENT_AMBULATORY_CARE_PROVIDER_SITE_OTHER): Payer: Medicare Other | Admitting: Family Medicine

## 2023-04-30 ENCOUNTER — Encounter: Payer: Self-pay | Admitting: Family Medicine

## 2023-04-30 VITALS — BP 128/76 | HR 73 | Temp 97.9°F | Ht 70.5 in | Wt 247.0 lb

## 2023-04-30 DIAGNOSIS — I5022 Chronic systolic (congestive) heart failure: Secondary | ICD-10-CM | POA: Diagnosis not present

## 2023-04-30 DIAGNOSIS — N401 Enlarged prostate with lower urinary tract symptoms: Secondary | ICD-10-CM

## 2023-04-30 DIAGNOSIS — N3943 Post-void dribbling: Secondary | ICD-10-CM

## 2023-04-30 DIAGNOSIS — I4892 Unspecified atrial flutter: Secondary | ICD-10-CM | POA: Diagnosis not present

## 2023-04-30 DIAGNOSIS — I5020 Unspecified systolic (congestive) heart failure: Secondary | ICD-10-CM | POA: Insufficient documentation

## 2023-04-30 DIAGNOSIS — R052 Subacute cough: Secondary | ICD-10-CM | POA: Diagnosis not present

## 2023-04-30 MED ORDER — FUROSEMIDE 20 MG PO TABS: 10.0000 mg | ORAL_TABLET | Freq: Every day | ORAL | 3 refills | Status: DC | PRN

## 2023-04-30 NOTE — Patient Instructions (Addendum)
 Trial lasix 1/2-1 tablet as needed for leg swelling or shortness of breath/cough.  May continue nyquil as needed for cough at night.  Let us know if not continuing to improve - consider lung doctor evaluation for recurrent coughing episodes.  Good to see you today, return for physical in April

## 2023-04-30 NOTE — Assessment & Plan Note (Signed)
 Notes ongoing lower urinary tract symptoms despite flomax 0.8mg  daily and finasteride.  Discussed surgical correction option if interested.

## 2023-04-30 NOTE — Assessment & Plan Note (Signed)
 H/o PSVT, afib /aflutter s/p TEE with cardioversion now on eliquis, continues carvedilol.

## 2023-04-30 NOTE — Assessment & Plan Note (Addendum)
 Ongoing for 2 months, s/p treatment with omeprazole, prednisone, tussionex, and latest augmentin course.  CXR from last week showed possible opacities vs atx s/p augmentin treatment. Will recommend rpt CXR 1 month to ensure this has cleared.  No signs of ongoing bacterial infection, no need for further antibiotics at this time.  Suspect post-infectious cough that is slowly improving, ?RAD however albuterol rescue inhaler previously was not helpful. Discussed prednisone vs inhaled corticosteroid vs refill tussionex, he declines at this time. Trial lasix PRN in case component of CHF contributing to cough and orthopnea. He will update me with effect

## 2023-04-30 NOTE — Assessment & Plan Note (Addendum)
 Latest echo with 40-45% EF.  Trial lasix as per above. Discussed increased potassium intake when lasix taken.  Consider BNP next labwork.

## 2023-04-30 NOTE — Progress Notes (Signed)
 Ph: [phone] Fax: [phone]   Patient ID: Alec Maldonado, male    DOB: 08-Sep-1950, 73 y.o.   MRN: [id number]  This visit was conducted in person.  BP 128/76   Pulse 73   Temp 97.9 F (36.6 C) (Oral)   Ht 5' 10.5" (1.791 m)   Wt 247 lb (112 kg)   SpO2 95%   BMI 34.94 kg/m    CC: cough f/u visit  Subjective:   HPI: Alec Maldonado is a 73 y.o. male presenting on 04/30/2023 for Cough (Here for 1 wk cough f/u- seen by Dr Milinda Antis and finished abx. C/o ongoing cough barely improving. )   Ongoing cough since 02/2023 - initially seen virtually treated with Rx prednisone, omeprazole, tussionex with limited benefit.  He feels this started after latest COVID and flu shots.  Tussionex was very sedating.  Tessalon perls haven't helped.  Saw Dr Milinda Antis on 04/20/2023 with ongoing symptoms - slight opacity at bilateral lung bases - treated for possible CAP with augmentin course.  He feels symptoms are slowly improving but persistent cough worse as day progresses. Clear mucous, no significant wheezing. No fevers/chills, no significant head congestion. No GERD symptoms. No allergic rhinitis symptoms. Some orthopnea, sleeps on 2 pillows at night. No noted leg swelling. He requests trial of lasix.  He is taking nyquil to help him sleep.   No h/o asthma or COPD.  Non smoker. No sick contacts.   H/o cryptogenic stroke, afib detected on loop recorder 07/2020 started on eliquis. H/o SVT followed by cardiology clinic.  S/p TEE/CV 02/2023.   DG Chest 2 View CLINICAL DATA:  Subacute cough for 2 months.  EXAM: CHEST - 2 VIEW  COMPARISON:  X-ray 06/27/2021 and older  FINDINGS: Sternal wires. Loop recorder. Stable cardiopericardial silhouette. Slight opacity at both lung bases. Atelectasis is favored over infiltrate but recommend follow-up. No pneumothorax or effusion. No edema. Degenerative changes of the spine on lateral view.  IMPRESSION: Mild lung base opacities. Atelectasis is favored but  recommend follow-up.  Postop chest.  Loop recorder.  Electronically Signed   By: Karen Kays M.D.   On: 04/20/2023 10:04  H/o afib with RV dysfunction as per above.   H/o COVID 10/2022 treated with Paxlovid and hycodan cough syrup.   Previous respiratory infection was 06/2022 - treated at that time for bronchitis and sinusitis with zpack/prednisone taper, tussionex cough syrup, followed by albuterol inhaler. Didn't feel albuterol was too helpful.  Tends to get bronchitis in the spring.  No new mold or pet or known allergen exposure.      Relevant past medical, surgical, family and social history reviewed and updated as indicated. Interim medical history since our last visit reviewed. Allergies and medications reviewed and updated. Outpatient Medications Prior to Visit  Medication Sig Dispense Refill   acetaminophen (TYLENOL) 500 MG tablet Take 1,000 mg by mouth every 8 (eight) hours as needed for moderate pain (pain score 4-6).     apixaban (ELIQUIS) 5 MG TABS tablet Take 1 tablet (5 mg total) by mouth 2 (two) times daily.     aspirin 81 MG EC tablet Take 1 tablet (81 mg total) by mouth daily. Swallow whole. 30 tablet 12   butalbital-acetaminophen-caffeine (FIORICET) 50-325-40 MG tablet Take 1 tablet by mouth 3 (three) times daily as needed for migraine. 20 tablet 0   carvedilol (COREG) 12.5 MG tablet Take 1 tablet (12.5 mg total) by mouth 2 (two) times daily with a meal. 180 tablet  3   ezetimibe (ZETIA) 10 MG tablet TAKE 1 TABLET(10 MG) BY MOUTH DAILY 90 tablet 4   finasteride (PROSCAR) 5 MG tablet Take 1 tablet (5 mg total) by mouth daily. 90 tablet 4   Omega-3 Fatty Acids (FISH OIL) 1200 MG CAPS Take 1,200 mg by mouth daily.     simvastatin (ZOCOR) 80 MG tablet Take 1 tablet (80 mg total) by mouth daily at 6 PM. 90 tablet 4   tamsulosin (FLOMAX) 0.4 MG CAPS capsule Take 2 capsules (0.8 mg total) by mouth at bedtime. 180 capsule 4   amoxicillin-clavulanate (AUGMENTIN) 875-125 MG  tablet Take 1 tablet by mouth 2 (two) times daily. 14 tablet 0   No facility-administered medications prior to visit.     Per HPI unless specifically indicated in ROS section below Review of Systems  Objective:  BP 128/76   Pulse 73   Temp 97.9 F (36.6 C) (Oral)   Ht 5' 10.5" (1.791 m)   Wt 247 lb (112 kg)   SpO2 95%   BMI 34.94 kg/m   Wt Readings from Last 3 Encounters:  04/30/23 247 lb (112 kg)  04/20/23 242 lb 6 oz (109.9 kg)  02/23/23 247 lb 3.2 oz (112.1 kg)      Physical Exam Vitals and nursing note reviewed.  Constitutional:      Appearance: Normal appearance. He is not ill-appearing.  HENT:     Head: Normocephalic and atraumatic.     Right Ear: Hearing, tympanic membrane, ear canal and external ear normal. There is no impacted cerumen.     Left Ear: Hearing, tympanic membrane, ear canal and external ear normal. There is no impacted cerumen.     Nose:     Right Sinus: No maxillary sinus tenderness or frontal sinus tenderness.     Left Sinus: No maxillary sinus tenderness or frontal sinus tenderness.     Comments: Wearing mask    Mouth/Throat:     Mouth: Mucous membranes are moist.     Pharynx: Oropharynx is clear. No oropharyngeal exudate or posterior oropharyngeal erythema.  Eyes:     Extraocular Movements: Extraocular movements intact.     Conjunctiva/sclera: Conjunctivae normal.     Pupils: Pupils are equal, round, and reactive to light.  Cardiovascular:     Rate and Rhythm: Normal rate. Rhythm irregular.     Pulses: Normal pulses.     Heart sounds: Normal heart sounds. No murmur heard. Pulmonary:     Effort: Pulmonary effort is normal. No respiratory distress.     Breath sounds: Normal breath sounds. No wheezing, rhonchi or rales.     Comments: Lungs clear, persistent cough present Musculoskeletal:     Cervical back: Normal range of motion and neck supple. No rigidity.     Right lower leg: No edema.     Left lower leg: No edema.  Lymphadenopathy:      Cervical: No cervical adenopathy.  Skin:    General: Skin is warm and dry.     Findings: No rash.  Neurological:     Mental Status: He is alert.  Psychiatric:        Mood and Affect: Mood normal.        Behavior: Behavior normal.       Results for orders placed or performed in visit on 04/09/23  CUP PACEART REMOTE DEVICE CHECK   Collection Time: 04/06/23 11:01 PM  Result Value Ref Range   Date Time Interrogation Session [phone]    Pulse Generator  Manufacturer MERM    Pulse Gen Model [id number] II    Pulse Gen Serial Number [id number]    Clinic Name [facility]    Implantable Pulse Generator Type ICM/ILR    Implantable Pulse Generator Implant Date [id number]    TEE 02/2023: LVEF 40-45%, mildly reduced RV systolic function, mod dilated LA, repaired MV with trivial MR, trivial AR with dilated aortic root to 40mm  Assessment & Plan:   Problem List Items Addressed This Visit     Benign prostatic hyperplasia with lower urinary tract symptoms   Notes ongoing lower urinary tract symptoms despite flomax 0.8mg  daily and finasteride.  Discussed surgical correction option if interested.       Atrial flutter (HCC)   H/o PSVT, afib /aflutter s/p TEE with cardioversion now on eliquis, continues carvedilol.      Relevant Medications   furosemide (LASIX) 20 MG tablet   Subacute cough - Primary   Ongoing for 2 months, s/p treatment with omeprazole, prednisone, tussionex, and latest augmentin course.  CXR from last week showed possible opacities vs atx s/p augmentin treatment. Will recommend rpt CXR 1 month to ensure this has cleared.  No signs of ongoing bacterial infection, no need for further antibiotics at this time.  Suspect post-infectious cough that is slowly improving, ?RAD however albuterol rescue inhaler previously was not helpful. Discussed prednisone vs inhaled corticosteroid vs refill tussionex, he declines at this time. Trial lasix PRN in case component of CHF  contributing to cough and orthopnea. He will update me with effect      Relevant Orders   DG Chest 2 View   Heart failure with mildly reduced ejection fraction (HFmrEF) (HCC)   Latest echo with 40-45% EF.  Trial lasix as per above. Discussed increased potassium intake when lasix taken.  Consider BNP next labwork.       Relevant Medications   furosemide (LASIX) 20 MG tablet     Meds ordered this encounter  Medications   furosemide (LASIX) 20 MG tablet    Sig: Take 0.5-1 tablets (10-20 mg total) by mouth daily as needed for fluid or edema (shortness of breath).    Dispense:  30 tablet    Refill:  3    Orders Placed This Encounter  Procedures   DG Chest 2 View    Standing Status:   Future    Expiration Date:   04/29/2024    Reason for Exam (SYMPTOM  OR DIAGNOSIS REQUIRED):   f/u lung opacities    Preferred imaging location?:   [facility]    Patient Instructions  Trial lasix 1/2-1 tablet as needed for leg swelling or shortness of breath/cough.  May continue nyquil as needed for cough at night.  Let us know if not continuing to improve - consider lung doctor evaluation for recurrent coughing episodes.  Good to see you today, return for physical in April  Follow up plan: Return if symptoms worsen or fail to improve.  Eustaquio Boyden, MD

## 2023-05-07 ENCOUNTER — Encounter: Payer: Self-pay | Admitting: Internal Medicine

## 2023-05-14 ENCOUNTER — Ambulatory Visit (INDEPENDENT_AMBULATORY_CARE_PROVIDER_SITE_OTHER): Payer: Medicare Other

## 2023-05-14 ENCOUNTER — Ambulatory Visit (INDEPENDENT_AMBULATORY_CARE_PROVIDER_SITE_OTHER)
Admission: RE | Admit: 2023-05-14 | Discharge: 2023-05-14 | Disposition: A | Discharge status: Home / Self Care | Source: Ambulatory Visit | Attending: Family Medicine | Admitting: Family Medicine

## 2023-05-14 ENCOUNTER — Ambulatory Visit (INDEPENDENT_AMBULATORY_CARE_PROVIDER_SITE_OTHER): Admitting: Family Medicine

## 2023-05-14 ENCOUNTER — Encounter: Payer: Self-pay | Admitting: Family Medicine

## 2023-05-14 VITALS — BP 138/72 | HR 82 | Temp 97.8°F | Ht 70.5 in | Wt 245.0 lb

## 2023-05-14 DIAGNOSIS — I517 Cardiomegaly: Secondary | ICD-10-CM | POA: Diagnosis not present

## 2023-05-14 DIAGNOSIS — R053 Chronic cough: Secondary | ICD-10-CM | POA: Diagnosis not present

## 2023-05-14 DIAGNOSIS — I63449 Cerebral infarction due to embolism of unspecified cerebellar artery: Secondary | ICD-10-CM

## 2023-05-14 DIAGNOSIS — R918 Other nonspecific abnormal finding of lung field: Secondary | ICD-10-CM | POA: Diagnosis not present

## 2023-05-14 DIAGNOSIS — I5022 Chronic systolic (congestive) heart failure: Secondary | ICD-10-CM

## 2023-05-14 DIAGNOSIS — Z9889 Other specified postprocedural states: Secondary | ICD-10-CM

## 2023-05-14 DIAGNOSIS — I4892 Unspecified atrial flutter: Secondary | ICD-10-CM | POA: Diagnosis not present

## 2023-05-14 LAB — CUP PACEART REMOTE DEVICE CHECK
Date Time Interrogation Session: 20250309230124
Implantable Pulse Generator Implant Date: 20210414

## 2023-05-14 MED ORDER — DOXYCYCLINE HYCLATE 100 MG PO TABS
100.0000 mg | ORAL_TABLET | Freq: Two times a day (BID) | ORAL | 0 refills | Status: DC
Start: 2023-05-14 — End: 2023-06-25

## 2023-05-14 NOTE — Progress Notes (Signed)
 Ph: [phone] Fax: [phone]   Patient ID: Alec Maldonado, male    DOB: 1950/09/02, 73 y.o.   MRN: [id number]  This visit was conducted in person.  BP 138/72   Pulse 82   Temp 97.8 F (36.6 C) (Oral)   Ht 5' 10.5" (1.791 m)   Wt 245 lb (111.1 kg)   SpO2 96%   PF 420 L/min Comment: 1) 410, 2) 420, 3) 360  BMI 34.66 kg/m    PF = 420 (84% expected) Expected for age/height ~= 500  CC: f/u cough Subjective:   HPI: Alec Maldonado is a 73 y.o. male presenting on 05/14/2023 for Cough (C/o ongoing cough. Seen for recently- no better, no worse. )   See prior notes for details.  Ongoing cough since December 2024-has been seen virtually and in office a total of 4 times including today. Treated initially with prednisone, omeprazole, Tussionex with limited benefit, Tussionex was very sedating.  Tessalon Perles have not helped.  He feels this cough started after latest COVID and flu vaccines.  Treated for possible community-acquired pneumonia with an Augmentin course.   Symptoms are slow to improve. Ongoing cough, productive of clear mucus, some coughing fits associated, no wheezing.  No GERD symptoms.  No allergic rhinitis symptoms. No fevers/chills. Mild orthopnea, sleeps on 2 pillows at night chronically.  No leg swelling.  No personal smoking history, asthma history, no known COPD.   Previous respiratory infection was 06/2022 - treated at that time for bronchitis and sinusitis with zpack/prednisone taper, tussionex cough syrup, followed by albuterol inhaler. Didn't feel albuterol was beneficial.  Tends to get bronchitis in the spring.  No new mold or pet or known allergen exposure.  Remote exposure to ceramic dust - tiles.   Most recently seen 2 to 3 weeks ago, placed on as needed Lasix for possible CHF component to cough. Lasix didn't help cough or symptoms.   H/o cryptogenic stroke, afib detected on loop recorder 07/2020 started on eliquis. H/o SVT followed by cardiology clinic.   S/p TEE/CV 02/2023.  H/o afib with RV dysfunction  H/o COVID 10/2022 treated with Paxlovid and hycodan cough syrup.      Relevant past medical, surgical, family and social history reviewed and updated as indicated. Interim medical history since our last visit reviewed. Allergies and medications reviewed and updated. Outpatient Medications Prior to Visit  Medication Sig Dispense Refill   acetaminophen (TYLENOL) 500 MG tablet Take 1,000 mg by mouth every 8 (eight) hours as needed for moderate pain (pain score 4-6).     apixaban (ELIQUIS) 5 MG TABS tablet Take 1 tablet (5 mg total) by mouth 2 (two) times daily.     aspirin 81 MG EC tablet Take 1 tablet (81 mg total) by mouth daily. Swallow whole. 30 tablet 12   butalbital-acetaminophen-caffeine (FIORICET) 50-325-40 MG tablet Take 1 tablet by mouth 3 (three) times daily as needed for migraine. 20 tablet 0   carvedilol (COREG) 12.5 MG tablet Take 1 tablet (12.5 mg total) by mouth 2 (two) times daily with a meal. 180 tablet 3   ezetimibe (ZETIA) 10 MG tablet TAKE 1 TABLET(10 MG) BY MOUTH DAILY 90 tablet 4   finasteride (PROSCAR) 5 MG tablet Take 1 tablet (5 mg total) by mouth daily. 90 tablet 4   furosemide (LASIX) 20 MG tablet Take 0.5-1 tablets (10-20 mg total) by mouth daily as needed for fluid or edema (shortness of breath). 30 tablet 3   Omega-3 Fatty Acids (  FISH OIL) 1200 MG CAPS Take 1,200 mg by mouth daily.     simvastatin (ZOCOR) 80 MG tablet Take 1 tablet (80 mg total) by mouth daily at 6 PM. 90 tablet 4   tamsulosin (FLOMAX) 0.4 MG CAPS capsule Take 2 capsules (0.8 mg total) by mouth at bedtime. 180 capsule 4   No facility-administered medications prior to visit.     Per HPI unless specifically indicated in ROS section below Review of Systems  Objective:  BP 138/72   Pulse 82   Temp 97.8 F (36.6 C) (Oral)   Ht 5' 10.5" (1.791 m)   Wt 245 lb (111.1 kg)   SpO2 96%   PF 420 L/min Comment: 1) 410, 2) 420, 3) 360  BMI 34.66  kg/m   Wt Readings from Last 3 Encounters:  05/14/23 245 lb (111.1 kg)  04/30/23 247 lb (112 kg)  04/20/23 242 lb 6 oz (109.9 kg)      Physical Exam Vitals and nursing note reviewed.  Constitutional:      Appearance: Normal appearance. He is not ill-appearing.  HENT:     Head: Normocephalic and atraumatic.     Mouth/Throat:     Comments: Wearing mask Eyes:     Extraocular Movements: Extraocular movements intact.     Conjunctiva/sclera: Conjunctivae normal.     Pupils: Pupils are equal, round, and reactive to light.  Cardiovascular:     Rate and Rhythm: Normal rate and regular rhythm.     Pulses: Normal pulses.     Heart sounds: Normal heart sounds. No murmur heard. Pulmonary:     Effort: Pulmonary effort is normal. No respiratory distress.     Breath sounds: No wheezing, rhonchi or rales.     Comments: Slightly diminished breath sounds to LLL Musculoskeletal:     Cervical back: Normal range of motion and neck supple.     Right lower leg: No edema.     Left lower leg: No edema.  Skin:    General: Skin is warm and dry.     Findings: No rash.  Neurological:     Mental Status: He is alert.  Psychiatric:        Mood and Affect: Mood normal.        Behavior: Behavior normal.       Results for orders placed or performed in visit on 04/09/23  CUP PACEART REMOTE DEVICE CHECK   Collection Time: 04/06/23 11:01 PM  Result Value Ref Range   Date Time Interrogation Session [phone]    Pulse Generator Manufacturer MERM    Pulse Gen Model [id number] LINQ II    Pulse Gen Serial Number [id number]    Clinic Name [facility]    Implantable Pulse Generator Type ICM/ILR    Implantable Pulse Generator Implant Date [id number]    TEE 02/2023: LVEF 40-45%, mildly reduced RV systolic function, mod dilated LA, repaired MV with trivial MR, trivial AR with dilated aortic root to 40mm   DG Chest 2 View CLINICAL DATA:  Subacute cough for 2 months.  EXAM: CHEST - 2 VIEW  COMPARISON:   X-ray 06/27/2021 and older  FINDINGS: Sternal wires. Loop recorder. Stable cardiopericardial silhouette. Slight opacity at both lung bases. Atelectasis is favored over infiltrate but recommend follow-up. No pneumothorax or effusion. No edema. Degenerative changes of the spine on lateral view.  IMPRESSION: Mild lung base opacities. Atelectasis is favored but recommend follow-up.  Postop chest.  Loop recorder.  Electronically Signed   By: Karen Kays  M.D.   On: 04/20/2023 10:04  Rpt CXR today pending   Assessment & Plan:   Problem List Items Addressed This Visit     Status post mitral valve repair   Atrial flutter (HCC)   Chronic cough - Primary   Persistent despite treatments tried to date. PF today = 420 L/min (84% expected). Will Rx doxycycline course to cover prolonged atypical infection. Avoiding azithromycin in cardiac history. If persistent cough despite this, low threshold to refer to pulm to consider PFTs.       Heart failure with mildly reduced ejection fraction (HFmrEF) (HCC)     Meds ordered this encounter  Medications   doxycycline (VIBRA-TABS) 100 MG tablet    Sig: Take 1 tablet (100 mg total) by mouth 2 (two) times daily.    Dispense:  20 tablet    Refill:  0    No orders of the defined types were placed in this encounter.   Patient Instructions  Chest xray today  Peak flow overall ok (84% of expected).  Take doxyycline antibiotic to cover possible ongoing lung infection. If cough not fully resolved after this, let us know for referral to lung doctor.   Follow up plan: Return if symptoms worsen or fail to improve.  Eustaquio Boyden, MD

## 2023-05-14 NOTE — Assessment & Plan Note (Addendum)
 Persistent despite treatments tried to date. PF today = 420 L/min (84% expected). Will Rx doxycycline course to cover prolonged atypical infection. Avoiding azithromycin in cardiac history. If persistent cough despite this, low threshold to refer to pulm to consider PFTs.

## 2023-05-14 NOTE — Patient Instructions (Addendum)
 Chest xray today  Peak flow overall ok (84% of expected).  Take doxyycline antibiotic to cover possible ongoing lung infection. If cough not fully resolved after this, let us know for referral to lung doctor.

## 2023-05-16 NOTE — Addendum Note (Signed)
 Addended by: Geralyn Flash D on: 05/16/2023 03:26 PM   Modules accepted: Orders

## 2023-05-16 NOTE — Progress Notes (Signed)
 [facility] Summary Report / Loop Recorder

## 2023-05-22 ENCOUNTER — Telehealth: Payer: Self-pay | Admitting: Internal Medicine

## 2023-05-22 NOTE — Telephone Encounter (Signed)
 [facility] [employer] [facility] Fol[facility], can we get her scheduled for gen cards fu with either Dr. Cristal Deer, Luther Parody or [place]? Thank you

## 2023-05-22 NOTE — Telephone Encounter (Signed)
 Spoke with patient. He feels cough is slowly improving, has not finished doxycycline course yet. Offered pulm referral , he declines for now, will await finishing abx and let me know if not better for pulm referral.  He will also await cards eval. Lasix trial was not helpful last month.

## 2023-05-22 NOTE — Telephone Encounter (Signed)
  1. Has your device fired? no  2. Is you device beeping? No   3. Are you experiencing draining or swelling at device site? No   4. Are you calling to see if we received your device transmission? Yes pt feels he has been in Afib the last three days but has not heard anything from [place]  5. Have you passed out? no   Please route to Device Clinic Pool

## 2023-05-22 NOTE — Telephone Encounter (Signed)
 Device transmission:  (patient experiencing acute sx's concerned that he is back in AF). Presenting rhythm appears NSR 60-70's with ectopy.  HX of PAC's/PVC's.   PVC burden is 8.7%. (7% burden noted at December OV with EP).  No episodes of AF since January. Patient is on an OAC.   Patient complains of dull ache intermittent chest pain, SOB at rest and exertion, and cough persistent since December, denies any evidence of edema.  Recently treated for respiratory infection by Dr. Sharen Hones with doxycycline. Patient notices sx increase since Sunday prompting request to review ILR transmission.   PLAN:  Past due for follow up with gen cards: Dr. Jodelle Red, will forward to her team for HF follow up.    Patient also instructed to follow up with Dr. Sharen Hones for continued symptoms s/p abx treatment.  Mention need for possible f/u with pulmonary, will defer to PCP.

## 2023-05-30 ENCOUNTER — Encounter: Payer: Self-pay | Admitting: Family Medicine

## 2023-06-11 ENCOUNTER — Ambulatory Visit (INDEPENDENT_AMBULATORY_CARE_PROVIDER_SITE_OTHER): Payer: Medicare Other

## 2023-06-11 VITALS — BP 138/72 | Ht 70.5 in | Wt 240.0 lb

## 2023-06-11 DIAGNOSIS — Z0000 Encounter for general adult medical examination without abnormal findings: Secondary | ICD-10-CM

## 2023-06-11 NOTE — Patient Instructions (Addendum)
 Alec Maldonado

## 2023-06-11 NOTE — Progress Notes (Addendum)
 Because this visit was a virtual/telehealth visit,  certain criteria was not obtained, such a blood pressure, CBG if applicable, and timed get up and go. Any medications not marked as "taking" were not mentioned during the medication reconciliation part of the visit. Any vitals not documented were not able to be obtained due to this being a telehealth visit or patient was unable to self-report a recent blood pressure reading due to a lack of equipment at home via telehealth. Vitals that have been documented are verbally provided by the patient.   Subjective:   Alec Maldonado is a 73 y.o. who presents for a Medicare Wellness preventive visit.  Visit Complete: Virtual I connected with  Alec Maldonado on 06/11/23 by a audio enabled telemedicine application and verified that I am speaking with the correct person using two identifiers.  Patient Location: Home  Provider Location: Home Office  I discussed the limitations of evaluation and management by telemedicine. The patient expressed understanding and agreed to proceed.  Vital Signs: Because this visit was a virtual/telehealth visit, some criteria may be missing or patient reported. Any vitals not documented were not able to be obtained and vitals that have been documented are patient reported.  VideoDeclined- This patient declined Librarian, academic. Therefore the visit was completed with audio only.  Persons Participating in Visit: Patient.  AWV Questionnaire: Yes: Patient Medicare AWV questionnaire was completed by the patient on 06/07/2023; I have confirmed that all information answered by patient is correct and no changes since this date.  Cardiac Risk Factors include: advanced age (>19men, >29 women);hypertension;male gender;obesity (BMI >30kg/m2)     Objective:    Today's Vitals   06/11/23 1510  BP: 138/72  Weight: 240 lb (108.9 kg)  Height: 5' 10.5" (1.791 m)   Body mass index is 33.95 kg/m.      02/27/2023    6:51 AM 06/08/2022   10:36 AM 06/06/2021    2:05 PM 06/25/2020    1:52 PM 05/14/2020    1:08 PM 06/16/2019   12:15 PM 05/10/2018    2:32 PM  Advanced Directives  Does Patient Have a Medical Advance Directive? No;Yes Yes Yes Yes Yes No Yes  Type of Estate agent of [place];Living will Healthcare Power of [place];Living will Healthcare Power of [place];Living will Healthcare Power of [place];Living will Healthcare Power of [place];Living will  Healthcare Power of [place];Living will  Does patient want to make changes to medical advance directive?  No - Patient declined       Copy of Healthcare Power of Attorney in Chart?  Yes - validated most recent copy scanned in chart (See row information) Yes - validated most recent copy scanned in chart (See row information)  Yes - validated most recent copy scanned in chart (See row information)  No - copy requested  Would patient like information on creating a medical advance directive?      No - Patient declined     Current Medications (verified) Outpatient Encounter Medications as of 06/11/2023  Medication Sig   acetaminophen (TYLENOL) 500 MG tablet Take 1,000 mg by mouth every 8 (eight) hours as needed for moderate pain (pain score 4-6).   apixaban (ELIQUIS) 5 MG TABS tablet Take 1 tablet (5 mg total) by mouth 2 (two) times daily.   aspirin 81 MG EC tablet Take 1 tablet (81 mg total) by mouth daily. Swallow whole.   butalbital-acetaminophen-caffeine (FIORICET) 50-325-40 MG tablet Take 1 tablet by mouth 3 (three) times  daily as needed for migraine.   carvedilol (COREG) 12.5 MG tablet Take 1 tablet (12.5 mg total) by mouth 2 (two) times daily with a meal.   ezetimibe (ZETIA) 10 MG tablet TAKE 1 TABLET(10 MG) BY MOUTH DAILY   finasteride (PROSCAR) 5 MG tablet Take 1 tablet (5 mg total) by mouth daily.   furosemide (LASIX) 20 MG tablet Take 0.5-1 tablets (10-20 mg total) by mouth daily as needed for fluid or edema  (shortness of breath).   Omega-3 Fatty Acids (FISH OIL) 1200 MG CAPS Take 1,200 mg by mouth daily.   simvastatin (ZOCOR) 80 MG tablet Take 1 tablet (80 mg total) by mouth daily at 6 PM.   tamsulosin (FLOMAX) 0.4 MG CAPS capsule Take 2 capsules (0.8 mg total) by mouth at bedtime.   doxycycline (VIBRA-TABS) 100 MG tablet Take 1 tablet (100 mg total) by mouth 2 (two) times daily. (Patient not taking: Reported on 06/11/2023)   No facility-administered encounter medications on file as of 06/11/2023.    Allergies (verified) Patient has no known allergies.   History: Past Medical History:  Diagnosis Date   Allergy    Aortic insufficiency    Mild, echo, January, 2014   Basal cell carcinoma (BCC) of multiple sites 2023   R lateral upper back, l ear antihelix (Dr Yetta Barre)   [facility] (basal cell carcinoma), back 2017   s/p excisional biospy by derm   BPH (benign prostatic hypertrophy)    Central retinal artery occlusion of left eye 06/16/2019   S/p ER eval 06/2019 Saw neurology Pearlean Brownie) - left eye vision loss due to central retinal artery occlusion in April 2021, MRI scan showing silent cerebral embolic infarcts of cryptogenic etiology   Chest discomfort    nuclear 06/24/09, suggestion prior small inferior MI with moderate peri-infarvt ischemia versus variable diaphragmatic attenuation   Colon polyps    colonoscopy 07/02/06, repeat in 10 years   Coronary artery disease    Dyslipidemia    Dysplastic nevus of trunk 08/20/2012   Dysrhythmia    supraventricular tachycardia, paplitations   Ejection fraction    EF 55%, echo, October, 2010  //   EF 45-50%,  catheterization, 2011   Heart murmur    History 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 TEE 06/2019 IMPRESSION: 1. Left ventricular ejection fraction, by estimation, is 50 to 55%. The left ventricle has low  normal function. The left ventricle has no regional wall motion abnormalities. There is severe asymmetric left ventricular hypertrophy 2. Right ventricular systolic function is normal.   Hyperlipidemia    Migraines    rare   Mitral regurgitation    Mitral valve repair 2001   Neurofibroma of foot 04/2017   s/p removal by derm   Palpitations 07/2009    helped with carvedilol    Pleurisy    Postural dizziness    mild, no orthostatic changes by blood pressure check   Pre-diabetes    Pre-syncope    January, 2014   Right ventricular dysfunction    Mild/moderate right ventricular dysfunction by echo, January, 2014, no tricuspid regurgitation, therefore right heart pressure could not be estimated.   Shortness of breath    Status post mitral valve repair    2001,  //  echo, October, 2010, good function of the repaired mitral valve   Stroke (HCC) 2021   lt eye  SVT (supraventricular tachycardia) (HCC) 03/11/2013   Isolated episodes - last 03/2013 after flu treated with adenosine cardioversion in ER.    Thyroid disease    Past Surgical History:  Procedure Laterality Date   aneurysmal dilatation     proximal LAD cath 2001, reoeat catheter 2004, no significant change   aneurysmal dilatiation  07/15/09   no change, normal right heart filling pressures and LVEDP- EF 45-50%   APPENDECTOMY  1970   BUBBLE STUDY  06/18/2019   Procedure: BUBBLE STUDY;  Surgeon: Quintella Reichert, MD;  Location: [facility] ENDOSCOPY;  Service: Cardiovascular;;   CARDIAC CATHETERIZATION  2010   mild global LV dysfunction EF 45-50%, no significant CAD obstruction, aneurysmal dilatation LAD   CARDIOVERSION N/A 02/27/2023   Procedure: CARDIOVERSION;  Surgeon: Little Ishikawa, MD;  Location: [facility] INVASIVE CV LAB;  Service: Cardiovascular;  Laterality: N/A;   COLONOSCOPY  2008   COLONOSCOPY  06/2016   diverticulosis, rpt 10 yrs Christella Hartigan)   CORONARY ANGIOPLASTY  09/1999   severe MR o/w normal limits   CORONARY ANGIOPLASTY   12/19/02   pos. CAD, mild LV dysfunction- med treatment   EYE SURGERY Left    HERNIA REPAIR  01/11/05   left, Dr. Daphine Deutscher   I & D EXTREMITY Right 02/04/2015   Procedure: IRRIGATION AND DEBRIDEMENT RIGHT INDEX FINGER OPEN FRACTURE AND REPAIR DIGITAL NERVE AND ARTERY;  Surgeon: Betha Loa, MD;  Location: [facility] OR;  Service: Orthopedics;  Laterality: Right;   LOOP RECORDER INSERTION N/A 06/18/2019   Procedure: LOOP RECORDER INSERTION;  Surgeon: Duke Salvia, MD;  Location: [facility] INVASIVE CV LAB;  Service: Cardiovascular;  Laterality: N/A;   MITRAL VALVE REPAIR  01/22/00   good mitral valve function- echo 12/2008- EF 55% / nuclear 06/2009- EF 36%,? reliable? / EF 45-50%, cath rated 02/2010   ORIF ELBOW FRACTURE     left   POLYPECTOMY     TEE WITHOUT CARDIOVERSION N/A 06/18/2019   Procedure: TRANSESOPHAGEAL ECHOCARDIOGRAM (TEE);  Surgeon: Quintella Reichert, MD;  Location: [facility] ENDOSCOPY;  Service: Cardiovascular;  Laterality: N/A;   THYROID SURGERY     THYROIDECTOMY     left, due to goiter 1994   TOTAL KNEE ARTHROPLASTY Right 06/29/2020   Procedure: RIGHT TOTAL KNEE ARTHROPLASTY;  Surgeon: Valeria Batman, MD;  Location: [facility] ORS;  Service: Orthopedics;  Laterality: Right;   TRANSESOPHAGEAL ECHOCARDIOGRAM (CATH LAB) N/A 02/27/2023   Procedure: TRANSESOPHAGEAL ECHOCARDIOGRAM;  Surgeon: Little Ishikawa, MD;  Location: [facility] INVASIVE CV LAB;  Service: Cardiovascular;  Laterality: N/A;   Family History  Problem Relation Age of Onset   Alzheimer's disease Mother    Heart disease Mother        CHF   Arthritis Father    Heart disease Father        leaking mitral valve   Benign prostatic hyperplasia Father    Heart disease Brother        murmur   Hypertension Neg Hx    Diabetes Neg Hx    Depression Neg Hx    Alcohol abuse Neg Hx    Drug abuse Neg Hx    Stroke Neg Hx    Cancer Neg Hx    Colon cancer Neg Hx    Esophageal cancer Neg Hx    Rectal cancer Neg Hx    Stomach cancer Neg Hx     Social History   Socioeconomic History   Marital status: Married    Spouse name: Not on file  Number of children: 2   Years of education: Not on file   Highest education level: Associate degree: occupational, technical, or vocational program  Occupational History   Occupation: Printmaker  Tobacco Use   Smoking status: Never   Smokeless tobacco: Never  Vaping Use   Vaping status: Never Used  Substance and Sexual Activity   Alcohol use: No   Drug use: No   Sexual activity: Yes  Other Topics Concern   Not on file  Social History Narrative   Widowed, wife deceased colon cancer Jul 23, 1996, remarried 2001, lives with remarried wife   Occupation: retired, worked for coffee Engineer, petroleum   Activity: walking 30-81min 4-5d/wk   Diet: good water, fruits/vegetables daily   Social Drivers of Corporate investment banker Strain: Low Risk  (06/11/2023)   Overall Financial Resource Strain (CARDIA)    Difficulty of Paying Living Expenses: Not hard at all  Food Insecurity: No Food Insecurity (06/11/2023)   Hunger Vital Sign    Worried About Running Out of Food in the Last Year: Never true    Ran Out of Food in the Last Year: Never true  Transportation Needs: No Transportation Needs (06/11/2023)   PRAPARE - Administrator, Civil Service (Medical): No    Lack of Transportation (Non-Medical): No  Physical Activity: Inactive (06/11/2023)   Exercise Vital Sign    Days of Exercise per Week: 3 days    Minutes of Exercise per Session: 0 min  Stress: No Stress Concern Present (06/11/2023)   [employer] of Occupational Health - Occupational Stress Questionnaire    Feeling of Stress : Only a little  Social Connections: Socially Integrated (06/11/2023)   Social Connection and Isolation Panel [NHANES]    Frequency of Communication with Friends and Family: More than three times a week    Frequency of Social Gatherings with Friends and Family: More than three times a week     Attends Religious Services: More than 4 times per year    Active Member of [employer] or Organizations: Yes    Attends Engineer, structural: More than 4 times per year    Marital Status: Married    Tobacco Counseling Counseling given: Not Answered    Clinical Intake:  Pre-visit preparation completed: Yes  Pain : No/denies pain     BMI - recorded: 33.95 Nutritional Status: BMI > 30  Obese Nutritional Risks: None Diabetes: No  Lab Results  Component Value Date   HGBA1C 5.9 06/19/2022   HGBA1C 6.0 06/06/2021   HGBA1C 6.1 05/13/2020     How often do you need to have someone help you when you read instructions, pamphlets, or other written materials from your doctor or pharmacy?: 1 - Never  Interpreter Needed?: No  Information entered by :: Estell Harpin   Activities of Daily Living     06/07/2023    3:34 PM 02/27/2023    6:50 AM  In your present state of health, do you have any difficulty performing the following activities:  Hearing? 0 0  Vision? 0 0  Difficulty concentrating or making decisions? 0 0  Walking or climbing stairs? 0   Dressing or bathing? 0   Doing errands, shopping? 0   Preparing Food and eating ? N   Using the Toilet? N   In the past six months, have you accidently leaked urine? N   Do you have problems with loss of bowel control? N   Managing your Medications? N  Managing your Finances? N   Housekeeping or managing your Housekeeping? N     Patient Care Team: Eustaquio Boyden, MD as PCP - General (Family Medicine) Jodelle Red, MD as PCP - Cardiology (Cardiology)  Indicate any recent Medical Services you may have received from other than [facility] providers in the past year (date may be approximate).     Assessment:   This is a routine wellness examination for Alec Maldonado.  Hearing/Vision screen Hearing Screening - Comments:: Patient declined Vision Screening - Comments:: No vision issue   Goals Addressed              This Visit's Progress    Patient Stated   On track    05/14/2020, I will maintain and continue medication as prescribed.      Patient Stated       Patient would like to live to next day        Depression Screen     06/11/2023    4:06 PM 05/14/2023   12:11 PM 04/20/2023    8:21 AM 06/23/2022    2:45 PM 06/08/2022   10:35 AM 06/06/2021    2:01 PM 05/14/2020    1:09 PM  PHQ 2/9 Scores  PHQ - 2 Score 0 0 1 0 0 0 0  PHQ- 9 Score 0  2    0    Fall Risk     06/07/2023    3:34 PM 04/20/2023    8:21 AM 03/02/2023   11:22 AM 06/08/2022   10:31 AM 06/06/2021    2:05 PM  Fall Risk   Falls in the past year? 0 0 0 0 0  Number falls in past yr: 0 0 0 0 0  Injury with Fall? 0 0 0 0 0  Risk for fall due to : No Fall Risks No Fall Risks No Fall Risks No Fall Risks No Fall Risks  Follow up Falls evaluation completed Falls evaluation completed Falls evaluation completed Falls prevention discussed;Falls evaluation completed Falls prevention discussed    MEDICARE RISK AT HOME:  Medicare Risk at Home Any stairs in or around the home?: (Patient-Rptd) No If so, are there any without handrails?: (Patient-Rptd) Yes Home free of loose throw rugs in walkways, pet beds, electrical cords, etc?: (Patient-Rptd) Yes Adequate lighting in your home to reduce risk of falls?: (Patient-Rptd) Yes Life alert?: (Patient-Rptd) No Use of a cane, walker or w/c?: (Patient-Rptd) No Grab bars in the bathroom?: (Patient-Rptd) Yes Shower chair or bench in shower?: (Patient-Rptd) No Elevated toilet seat or a handicapped toilet?: (Patient-Rptd) Yes  TIMED UP AND GO:  Was the test performed?  No  Cognitive Function: 6CIT completed    05/14/2020    1:13 PM 05/10/2018    2:32 PM 02/24/2016   11:59 AM  MMSE - Mini Mental State Exam  Not completed: Refused    Orientation to time  5 5  Orientation to Place  5 5  Registration  3 3  Attention/ Calculation  0 0  Recall  3 3  Language- name 2 objects  0 0  Language- repeat  1  1  Language- follow 3 step command  3 3  Language- read & follow direction  0 0  Write a sentence  0 0  Copy design  0 0  Total score  20 20        06/11/2023    3:12 PM 06/08/2022   10:37 AM 06/06/2021    2:07 PM  6CIT Screen  What Year? 0 points 0 points 0 points  What month? 0 points 0 points 0 points  What time? 0 points 0 points 0 points  Count back from 20 0 points 0 points 0 points  Months in reverse 0 points 0 points 0 points  Repeat phrase 0 points 0 points 2 points  Total Score 0 points 0 points 2 points    Immunizations Immunization History  Administered Date(s) Administered   Fluad Quad(high Dose 65+) 01/02/2022, 02/16/2023   Influenza, High Dose Seasonal PF 02/19/2017, 12/21/2017, 12/22/2020   Influenza, Quadrivalent, Recombinant, Inj, Pf 11/30/2018   Influenza,inj,Quad PF,6+ Mos 02/24/2016   Influenza-Unspecified 12/16/2019   Moderna Covid-19 Fall Seasonal Vaccine 45yrs & older 02/16/2023   PFIZER(Purple Top)SARS-COV-2 Vaccination 04/20/2019, 05/14/2019, 12/05/2019   Pneumococcal Conjugate-13 02/23/2015   Pneumococcal Polysaccharide-23 02/24/2016   Respiratory Syncytial Virus Vaccine,Recomb Aduvanted(Arexvy) 01/02/2022   Td 02/25/2003   Tdap 08/28/2013, 02/04/2015   Zoster Recombinant(Shingrix) 04/26/2021    Screening Tests Health Maintenance  Topic Date Due   Zoster Vaccines- Shingrix (2 of 2) 06/21/2021   COVID-19 Vaccine (5 - 2024-25 season) 08/17/2023   INFLUENZA VACCINE  10/05/2023   Medicare Annual Wellness (AWV)  06/10/2024   DTaP/Tdap/Td (4 - Td or Tdap) 02/03/2025   Colonoscopy  06/29/2026   Pneumonia Vaccine 54+ Years old  Completed   Hepatitis C Screening  Completed   HPV VACCINES  Aged Out    Health Maintenance  Health Maintenance Due  Topic Date Due   Zoster Vaccines- Shingrix (2 of 2) 06/21/2021   Health Maintenance Items Addressed:patient declined vaccination  Additional Screening:  Vision Screening: Recommended annual  ophthalmology exams for early detection of glaucoma and other disorders of the eye.  Dental Screening: Recommended annual dental exams for proper oral hygiene  Community Resource Referral / Chronic Care Management: CRR required this visit?  No   CCM required this visit?  No     Plan:     I have personally reviewed and noted the following in the patient's chart:   Medical and social history Use of alcohol, tobacco or illicit drugs  Nutritional status Physical activity Advanced directives List of other physicians Hospitalizations, surgeries, and ER visits in previous 12 months Vitals Screenings to include cognitive, depression, and falls Referrals and appointments  In addition, I have reviewed and discussed with patient certain preventive protocols, quality metrics, and best practice recommendations. A written personalized care plan for preventive services as well as general preventive health recommendations were provided to patient.     Rudi Heap, [place]   06/11/2023   After Visit Summary: (MyChart) Due to this being a telephonic visit, the after visit summary with patients personalized plan was offered to patient via MyChart   Notes: Nothing significant to report at this time.

## 2023-06-18 ENCOUNTER — Encounter: Payer: Self-pay | Admitting: Internal Medicine

## 2023-06-18 ENCOUNTER — Ambulatory Visit: Payer: Medicare Other

## 2023-06-18 DIAGNOSIS — I63449 Cerebral infarction due to embolism of unspecified cerebellar artery: Secondary | ICD-10-CM

## 2023-06-18 LAB — CUP PACEART REMOTE DEVICE CHECK
Date Time Interrogation Session: 20250413230216
Implantable Pulse Generator Implant Date: 20210414

## 2023-06-25 ENCOUNTER — Encounter: Payer: Self-pay | Admitting: Family Medicine

## 2023-06-25 ENCOUNTER — Other Ambulatory Visit: Payer: Self-pay | Admitting: Family Medicine

## 2023-06-25 ENCOUNTER — Ambulatory Visit (INDEPENDENT_AMBULATORY_CARE_PROVIDER_SITE_OTHER): Payer: Medicare Other | Admitting: Family Medicine

## 2023-06-25 VITALS — BP 136/84 | HR 56 | Temp 98.0°F | Ht 69.5 in | Wt 246.5 lb

## 2023-06-25 DIAGNOSIS — H3412 Central retinal artery occlusion, left eye: Secondary | ICD-10-CM

## 2023-06-25 DIAGNOSIS — I4892 Unspecified atrial flutter: Secondary | ICD-10-CM | POA: Diagnosis not present

## 2023-06-25 DIAGNOSIS — E785 Hyperlipidemia, unspecified: Secondary | ICD-10-CM

## 2023-06-25 DIAGNOSIS — Z7189 Other specified counseling: Secondary | ICD-10-CM

## 2023-06-25 DIAGNOSIS — R053 Chronic cough: Secondary | ICD-10-CM

## 2023-06-25 DIAGNOSIS — G8929 Other chronic pain: Secondary | ICD-10-CM

## 2023-06-25 DIAGNOSIS — Z0000 Encounter for general adult medical examination without abnormal findings: Secondary | ICD-10-CM

## 2023-06-25 DIAGNOSIS — G629 Polyneuropathy, unspecified: Secondary | ICD-10-CM

## 2023-06-25 DIAGNOSIS — I5022 Chronic systolic (congestive) heart failure: Secondary | ICD-10-CM

## 2023-06-25 DIAGNOSIS — R7303 Prediabetes: Secondary | ICD-10-CM

## 2023-06-25 DIAGNOSIS — R1032 Left lower quadrant pain: Secondary | ICD-10-CM

## 2023-06-25 DIAGNOSIS — Z8673 Personal history of transient ischemic attack (TIA), and cerebral infarction without residual deficits: Secondary | ICD-10-CM | POA: Diagnosis not present

## 2023-06-25 DIAGNOSIS — N3943 Post-void dribbling: Secondary | ICD-10-CM

## 2023-06-25 DIAGNOSIS — Z9889 Other specified postprocedural states: Secondary | ICD-10-CM

## 2023-06-25 DIAGNOSIS — N401 Enlarged prostate with lower urinary tract symptoms: Secondary | ICD-10-CM

## 2023-06-25 LAB — COMPREHENSIVE METABOLIC PANEL WITH GFR
ALT: 18 U/L (ref 0–53)
AST: 17 U/L (ref 0–37)
Albumin: 4.6 g/dL (ref 3.5–5.2)
Alkaline Phosphatase: 55 U/L (ref 39–117)
BUN: 11 mg/dL (ref 6–23)
CO2: 29 meq/L (ref 19–32)
Calcium: 9 mg/dL (ref 8.4–10.5)
Chloride: 104 meq/L (ref 96–112)
Creatinine, Ser: 1.07 mg/dL (ref 0.40–1.50)
GFR: 68.82 mL/min (ref >60.00)
Glucose, Bld: 104 mg/dL — ABNORMAL HIGH (ref 70–99)
Potassium: 4 meq/L (ref 3.5–5.1)
Sodium: 142 meq/L (ref 135–145)
Total Bilirubin: 0.9 mg/dL (ref 0.2–1.2)
Total Protein: 6.7 g/dL (ref 6.0–8.3)

## 2023-06-25 LAB — CBC WITH DIFFERENTIAL/PLATELET
Basophils Absolute: 0 10*3/uL (ref 0.0–0.1)
Basophils Relative: 0.6 % (ref 0.0–3.0)
Eosinophils Absolute: 0.2 10*3/uL (ref 0.0–0.7)
Eosinophils Relative: 2.5 % (ref 0.0–5.0)
HCT: 38.3 % — ABNORMAL LOW (ref 39.0–52.0)
Hemoglobin: 12.7 g/dL — ABNORMAL LOW (ref 13.0–17.0)
Lymphocytes Relative: 26 % (ref 12.0–46.0)
Lymphs Abs: 1.8 10*3/uL (ref 0.7–4.0)
MCHC: 33.1 g/dL (ref 30.0–36.0)
MCV: 84.6 fl (ref 78.0–100.0)
Monocytes Absolute: 0.7 10*3/uL (ref 0.1–1.0)
Monocytes Relative: 9.5 % (ref 3.0–12.0)
Neutro Abs: 4.3 10*3/uL (ref 1.4–7.7)
Neutrophils Relative %: 61.4 % (ref 43.0–77.0)
Platelets: 252 10*3/uL (ref 150.0–400.0)
RBC: 4.52 Mil/uL (ref 4.22–5.81)
RDW: 15.5 % (ref 11.5–15.5)
WBC: 7 10*3/uL (ref 4.0–10.5)

## 2023-06-25 LAB — LIPID PANEL
Cholesterol: 120 mg/dL (ref 0–200)
HDL: 36 mg/dL — ABNORMAL LOW (ref >39.00)
LDL Cholesterol: 61 mg/dL (ref 0–99)
NonHDL: 83.65
Total CHOL/HDL Ratio: 3
Triglycerides: 114 mg/dL (ref 0.0–149.0)
VLDL: 22.8 mg/dL (ref 0.0–40.0)

## 2023-06-25 LAB — VITAMIN B12: Vitamin B-12: 261 pg/mL (ref 211–911)

## 2023-06-25 LAB — TSH: TSH: 2.43 u[IU]/mL (ref 0.35–5.50)

## 2023-06-25 LAB — PSA: PSA: 0.74 ng/mL (ref 0.10–4.00)

## 2023-06-25 LAB — HEMOGLOBIN A1C: Hgb A1c MFr Bld: 6.1 % (ref 4.6–6.5)

## 2023-06-25 MED ORDER — FINASTERIDE 5 MG PO TABS: 5.0000 mg | ORAL_TABLET | Freq: Every day | ORAL | 4 refills | Status: AC

## 2023-06-25 MED ORDER — EZETIMIBE 10 MG PO TABS: ORAL_TABLET | ORAL | 4 refills | Status: AC

## 2023-06-25 MED ORDER — FUROSEMIDE 20 MG PO TABS: 20.0000 mg | ORAL_TABLET | Freq: Every day | ORAL | 4 refills | Status: AC

## 2023-06-25 MED ORDER — TAMSULOSIN HCL 0.4 MG PO CAPS: 0.8000 mg | ORAL_CAPSULE | Freq: Every day | ORAL | 4 refills | Status: AC

## 2023-06-25 MED ORDER — SIMVASTATIN 80 MG PO TABS: 80.0000 mg | ORAL_TABLET | Freq: Every day | ORAL | 4 refills | Status: AC

## 2023-06-25 NOTE — Progress Notes (Signed)
 [facility] Summary Report / Loop Recorder

## 2023-06-25 NOTE — Progress Notes (Unsigned)
 Ph: [phone] Fax: [phone]   Patient ID: Alec Maldonado, male    DOB: 08/12/1950, 74 y.o.   MRN: [id number]  This visit was conducted in person.  BP 136/84   Pulse (!) 56   Temp 98 F (36.7 C) (Oral)   Ht 5' 9.5" (1.765 m)   Wt 246 lb 8 oz (111.8 kg)   SpO2 96%   BMI 35.88 kg/m    CC: CPE Subjective:   HPI: Alec Maldonado is a 73 y.o. male presenting on 06/25/2023 for Annual Exam (MCR prt 2 [AWV- 06/11/23].)   Saw health advisor 06/2023 for medicare wellness visit. Note reviewed.   No results found.  Flowsheet Row Office Visit from 06/25/2023 in [facility]  PHQ-2 Total Score 0          06/25/2023    9:15 AM 06/07/2023    3:34 PM 04/20/2023    8:21 AM 03/02/2023   11:22 AM 06/08/2022   10:31 AM  Fall Risk   Falls in the past year? 0 0 0 0 0  Number falls in past yr:  0 0 0 0  Injury with Fall?  0 0 0 0  Risk for fall due to :  No Fall Risks No Fall Risks No Fall Risks No Fall Risks  Follow up  Falls evaluation completed Falls evaluation completed Falls evaluation completed Falls prevention discussed;Falls evaluation completed    R knee replacement Aviva Lemmings) Sep 02, 2020   H/o cryptogenic stroke, afib detected on loop recorder 08/02/20 started on eliquis . H/o SVT followed by cardiology clinic.  S/p TEE/CV 02/2023.  H/o afib with RV dysfunction  Sees EP Dr Rodolfo Clan, pacemaker in place - no afib on latest check.   Chronic R foot neuropathy to toes as well as down shin for years - started after knee replacement. Hasn't tried medications for this. Describes different sensation - tingling warm feeling, no burning pain, not uncomfortable, not affecting sleep. No leg weakness.   L groin pain for the past 3-4 years. Most noticeable when sitting on edge of seat. Concerned it may be a hernia.   Chronic cough intermittently present throughout the years, with some RLL scarring noted on CXR. Lasix  has not helped symptoms.  To consider pulm referral if  ongoing symptoms for formal PFTs.  H/o COVID 10/2022 treated with Paxlovid  and hycodan cough syrup.   Preventative: COLONOSCOPY 06/2016 - diverticulosis, rpt 10 yrs [facility] Macho).  Prostate screen - PSA yearly. Nocturia x2-3, post void dribbling.  Lung cancer screening - not eligible Flu yearly COVID vaccine Pfizer 04/2019, 05/2019, 12/2019, 02/2023 Tdap 2015 again 02/2015  Pneumovax 09/03/15, prevnar-13 09/03/14  RSV 12/2021  Shingrix - 04/2021, s/p 2nd as well Advanced directive discussion - scanned in chart 03/2017. Wife Orelia Binet would be HCPOA, then son Thurston Flow and daughter Edsel Grace. Would want HCPOA to make end of life decisions.  Seat belt use discussed Sunscreen use discussed. S/p BCC removed from back Sep 02, 2016 and ear 02-Sep-2021 - Dr Rochelle Chu.  Non smoker  Alcohol - none  Dentist q6 mo  Eye exam yearly - sees retinologist - lost near complete vision in left eye. Bowel - no constipation  Bladder - postvoid dribbling, predominant trouble incomplete emptying - this improved with increased flomax  dose. Saw urologist [facility] 03-Sep-2019.    Widower, wife deceased from colon cancer 08-02-96, remarried 09-03-99, lives with second wife  Occupation: retired, prior Financial controller for [employer] Activity: no regular exercise Diet: good water ,  fruits/vegetables daily     Relevant past medical, surgical, family and social history reviewed and updated as indicated. Interim medical history since our last visit reviewed. Allergies and medications reviewed and updated. Outpatient Medications Prior to Visit  Medication Sig Dispense Refill   acetaminophen  (TYLENOL ) 500 MG tablet Take 1,000 mg by mouth every 8 (eight) hours as needed for moderate pain (pain score 4-6).     apixaban  (ELIQUIS ) 5 MG TABS tablet Take 1 tablet (5 mg total) by mouth 2 (two) times daily.     aspirin  81 MG EC tablet Take 1 tablet (81 mg total) by mouth daily. Swallow whole. 30 tablet 12   butalbital -acetaminophen -caffeine  (FIORICET) 50-325-40 MG tablet  Take 1 tablet by mouth 3 (three) times daily as needed for migraine. 20 tablet 0   carvedilol  (COREG ) 12.5 MG tablet Take 1 tablet (12.5 mg total) by mouth 2 (two) times daily with a meal. 180 tablet 3   Omega-3 Fatty Acids (FISH OIL) 1200 MG CAPS Take 1,200 mg by mouth daily.     ezetimibe  (ZETIA ) 10 MG tablet TAKE 1 TABLET(10 MG) BY MOUTH DAILY 90 tablet 4   finasteride  (PROSCAR ) 5 MG tablet Take 1 tablet (5 mg total) by mouth daily. 90 tablet 4   furosemide  (LASIX ) 20 MG tablet Take 0.5-1 tablets (10-20 mg total) by mouth daily as needed for fluid or edema (shortness of breath). 30 tablet 3   simvastatin  (ZOCOR ) 80 MG tablet Take 1 tablet (80 mg total) by mouth daily at 6 PM. 90 tablet 4   tamsulosin  (FLOMAX ) 0.4 MG CAPS capsule Take 2 capsules (0.8 mg total) by mouth at bedtime. 180 capsule 4   doxycycline  (VIBRA -TABS) 100 MG tablet Take 1 tablet (100 mg total) by mouth 2 (two) times daily. (Patient not taking: Reported on 06/11/2023) 20 tablet 0   No facility-administered medications prior to visit.     Per HPI unless specifically indicated in ROS section below Review of Systems  Constitutional:  Negative for activity change, appetite change, chills, fatigue, fever and unexpected weight change.  HENT:  Negative for hearing loss.   Eyes:  Negative for visual disturbance.  Respiratory:  Negative for cough, chest tightness, shortness of breath and wheezing.   Cardiovascular:  Positive for leg swelling (R>L). Negative for chest pain and palpitations.  Gastrointestinal:  Positive for blood in stool (with constipation/straining) and constipation (occ). Negative for abdominal distention, abdominal pain, diarrhea, nausea and vomiting.  Genitourinary:  Negative for difficulty urinating and hematuria.  Musculoskeletal:  Negative for arthralgias, myalgias and neck pain.  Skin:  Negative for rash.  Neurological:  Negative for dizziness, seizures, syncope and headaches.  Hematological:  Negative for  adenopathy. Does not bruise/bleed easily.  Psychiatric/Behavioral:  Negative for dysphoric mood. The patient is not nervous/anxious.     Objective:  BP 136/84   Pulse (!) 56   Temp 98 F (36.7 C) (Oral)   Ht 5' 9.5" (1.765 m)   Wt 246 lb 8 oz (111.8 kg)   SpO2 96%   BMI 35.88 kg/m   Wt Readings from Last 3 Encounters:  06/25/23 246 lb 8 oz (111.8 kg)  06/11/23 240 lb (108.9 kg)  05/14/23 245 lb (111.1 kg)      Physical Exam Vitals and nursing note reviewed.  Constitutional:      General: He is not in acute distress.    Appearance: Normal appearance. He is well-developed. He is not ill-appearing.  HENT:     Head: Normocephalic and  atraumatic.     Right Ear: Hearing, tympanic membrane, ear canal and external ear normal.     Left Ear: Hearing, tympanic membrane, ear canal and external ear normal.     Mouth/Throat:     Mouth: Mucous membranes are moist.     Pharynx: Oropharynx is clear. No oropharyngeal exudate or posterior oropharyngeal erythema.  Eyes:     General: No scleral icterus.    Extraocular Movements: Extraocular movements intact.     Conjunctiva/sclera: Conjunctivae normal.     Pupils: Pupils are equal, round, and reactive to light.  Neck:     Thyroid : No thyroid  mass or thyromegaly.     Vascular: No carotid bruit.  Cardiovascular:     Rate and Rhythm: Normal rate and regular rhythm.     Pulses: Normal pulses.          Radial pulses are 2+ on the right side and 2+ on the left side.     Heart sounds: Normal heart sounds. No murmur heard. Pulmonary:     Effort: Pulmonary effort is normal. No respiratory distress.     Breath sounds: Normal breath sounds. No wheezing, rhonchi or rales.  Abdominal:     General: Bowel sounds are normal. There is no distension.     Palpations: Abdomen is soft. There is no mass.     Tenderness: There is no abdominal tenderness. There is no guarding or rebound.     Hernia: No hernia is present.       Comments: Discomfort to  palpation at left inguinal region with bulging noted with coughing  Genitourinary:    Penis: Normal.      Testes: Normal.        Right: Mass or tenderness not present.        Left: Mass or tenderness not present.  Musculoskeletal:        General: Swelling present. Normal range of motion.     Cervical back: Normal range of motion and neck supple.     Right lower leg: Edema (tr) present.     Left lower leg: Edema (tr) present.     Comments:  2+ DP bilaterally Diminished sensation to monofilament testing R sole Light touch sensation intact  Neg SLR bilaterally. No pain with int/ext rotation at hip.  Lymphadenopathy:     Cervical: No cervical adenopathy.  Skin:    General: Skin is warm and dry.     Findings: No rash.  Neurological:     General: No focal deficit present.     Mental Status: He is alert and oriented to person, place, and time.  Psychiatric:        Mood and Affect: Mood normal.        Behavior: Behavior normal.        Thought Content: Thought content normal.        Judgment: Judgment normal.       Results for orders placed or performed in visit on 06/25/23  Lipid panel   Collection Time: 06/25/23  9:15 AM  Result Value Ref Range   Cholesterol 120 0 - 200 mg/dL   Triglycerides 829.5 0.0 - 149.0 mg/dL   HDL 62.13 (L) >08.65 mg/dL   VLDL 78.4 0.0 - 69.6 mg/dL   LDL Cholesterol 61 0 - 99 mg/dL   Total CHOL/HDL Ratio 3    NonHDL 83.65   Comprehensive metabolic panel with GFR   Collection Time: 06/25/23  9:15 AM  Result Value Ref Range   Sodium  142 135 - 145 mEq/L   Potassium 4.0 3.5 - 5.1 mEq/L   Chloride 104 96 - 112 mEq/L   CO2 29 19 - 32 mEq/L   Glucose, Bld 104 (H) 70 - 99 mg/dL   BUN 11 6 - 23 mg/dL   Creatinine, Ser 1.61 0.40 - 1.50 mg/dL   Total Bilirubin 0.9 0.2 - 1.2 mg/dL   Alkaline Phosphatase 55 39 - 117 U/L   AST 17 0 - 37 U/L   ALT 18 0 - 53 U/L   Total Protein 6.7 6.0 - 8.3 g/dL   Albumin  4.6 3.5 - 5.2 g/dL   GFR 09.60 >45.40 mL/min    Calcium  9.0 8.4 - 10.5 mg/dL  Hemoglobin J8J   Collection Time: 06/25/23  9:15 AM  Result Value Ref Range   Hgb A1c MFr Bld 6.1 4.6 - 6.5 %  PSA   Collection Time: 06/25/23  9:15 AM  Result Value Ref Range   PSA 0.74 0.10 - 4.00 ng/mL  CBC with Differential/Platelet   Collection Time: 06/25/23  9:15 AM  Result Value Ref Range   WBC 7.0 4.0 - 10.5 K/uL   RBC 4.52 4.22 - 5.81 Mil/uL   Hemoglobin 12.7 (L) 13.0 - 17.0 g/dL   HCT 19.1 (L) 47.8 - 29.5 %   MCV 84.6 78.0 - 100.0 fl   MCHC 33.1 30.0 - 36.0 g/dL   RDW 62.1 30.8 - 65.7 %   Platelets 252.0 150.0 - 400.0 K/uL   Neutrophils Relative % 61.4 43.0 - 77.0 %   Lymphocytes Relative 26.0 12.0 - 46.0 %   Monocytes Relative 9.5 3.0 - 12.0 %   Eosinophils Relative 2.5 0.0 - 5.0 %   Basophils Relative 0.6 0.0 - 3.0 %   Neutro Abs 4.3 1.4 - 7.7 K/uL   Lymphs Abs 1.8 0.7 - 4.0 K/uL   Monocytes Absolute 0.7 0.1 - 1.0 K/uL   Eosinophils Absolute 0.2 0.0 - 0.7 K/uL   Basophils Absolute 0.0 0.0 - 0.1 K/uL  TSH   Collection Time: 06/25/23  9:15 AM  Result Value Ref Range   TSH 2.43 0.35 - 5.50 uIU/mL  Vitamin B12   Collection Time: 06/25/23  9:15 AM  Result Value Ref Range   Vitamin B-12 261 211 - 911 pg/mL    Assessment & Plan:   Problem List Items Addressed This Visit     Health maintenance examination - Primary (Chronic)   Preventative protocols reviewed and updated unless pt declined. Discussed healthy diet and lifestyle.       Advanced care planning/counseling discussion (Chronic)   Previously discussed      Dyslipidemia   Chronic, stable on high dose simvastatin  + zetia . The ASCVD Risk score (Arnett DK, et al., 2019) failed to calculate for the following reasons:   Risk score cannot be calculated because patient has a medical history suggesting prior/existing ASCVD       Relevant Medications   ezetimibe  (ZETIA ) 10 MG tablet   simvastatin  (ZOCOR ) 80 MG tablet   Other Relevant Orders   Lipid panel (Completed)    Comprehensive metabolic panel with GFR (Completed)   Benign prostatic hyperplasia with lower urinary tract symptoms   Stable period on finasteride  and flomax .       Relevant Medications   finasteride  (PROSCAR ) 5 MG tablet   tamsulosin  (FLOMAX ) 0.4 MG CAPS capsule   Other Relevant Orders   PSA (Completed)   Status post mitral valve repair   Stable period s/p MVR  2001 - continues aspirin  and eliquis  daily.       Chronic pain of left groin   Present for the past 6+ months.  On exam possible small hernia to left inguinal region - reproducible with cough.  No pain with rotation of femoral head in hip joint.  Offered gen surgery eval vs CT scan - he opts to monitor at this time.       Central retinal artery occlusion of left eye   With resultant L vision loss. Cryptogenic, now on eliquis .       Relevant Medications   ezetimibe  (ZETIA ) 10 MG tablet   furosemide  (LASIX ) 20 MG tablet   simvastatin  (ZOCOR ) 80 MG tablet   History 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   Continues aspirin , statin, zetia  and eliquis .       Severe obesity (BMI 35.0-39.9) with comorbidity (HCC)   Obesity complicated by comorbidities of prediabetes, dyslipidemia stroke and CHF.      Prediabetes   Update A1c.       Relevant Orders   Hemoglobin A1c (Completed)   Atrial flutter (HCC)   Afib/aflutter after COVID vaccine this past year s/p cardioversion.  Continues carvedilol  and eliquis  through cardiology.       Relevant Medications   ezetimibe  (ZETIA ) 10 MG tablet   furosemide  (LASIX ) 20 MG tablet   simvastatin  (ZOCOR ) 80 MG tablet   Chronic cough   Currently without cough.  Discussed pulm eval if recurrent issue this year.       Heart failure with mildly reduced ejection fraction (HFmrEF) (HCC)   Regularly followed by cardiology.  Continues lasix  20mg  daily with benefit.       Relevant Medications   ezetimibe  (ZETIA ) 10 MG tablet   furosemide   (LASIX ) 20 MG tablet   simvastatin  (ZOCOR ) 80 MG tablet   Other Relevant Orders   CBC with Differential/Platelet (Completed)   Neuropathy   Describes R foot/shin neuropathy symptoms present since R knee replacement.  No neuropathic burning pain. Discussed medications likely not beneficial in this setting.  Further evaluate with labwork today (TSH, B12).  Offered RLE nerve conduction study for further evaluation - he declines at this time.       Relevant Orders   TSH (Completed)   Vitamin B12 (Completed)     Meds ordered this encounter  Medications   ezetimibe  (ZETIA ) 10 MG tablet    Sig: TAKE 1 TABLET(10 MG) BY MOUTH DAILY    Dispense:  90 tablet    Refill:  4   finasteride  (PROSCAR ) 5 MG tablet    Sig: Take 1 tablet (5 mg total) by mouth daily.    Dispense:  90 tablet    Refill:  4   furosemide  (LASIX ) 20 MG tablet    Sig: Take 1 tablet (20 mg total) by mouth daily.    Dispense:  90 tablet    Refill:  4   simvastatin  (ZOCOR ) 80 MG tablet    Sig: Take 1 tablet (80 mg total) by mouth daily at 6 PM.    Dispense:  90 tablet    Refill:  4   tamsulosin  (FLOMAX ) 0.4 MG CAPS capsule    Sig: Take 2 capsules (0.8 mg total) by mouth at bedtime.    Dispense:  180 capsule    Refill:  4    Orders Placed This Encounter  Procedures   Lipid panel   Comprehensive metabolic panel with GFR   Hemoglobin A1c  PSA   CBC with Differential/Platelet   TSH   Vitamin B12    Patient Instructions  Labs today including vitamin B12 and thyroid  for neuropathy. Next step would be nerve conduction study - let me know if interested in proceeding with this.  For left groin pain - possible hernia on that side - let me know if interested in surgical evaluation vs CT scan to confirm hernia.  Good to see you today  Return in 6 months for follow up visit   Follow up plan: Return in about 6 months (around 12/25/2023) for follow up visit.  Claire Crick, MD

## 2023-06-25 NOTE — Patient Instructions (Addendum)
 Labs today including vitamin B12 and thyroid  for neuropathy. Next step would be nerve conduction study - let me know if interested in proceeding with this.  For left groin pain - possible hernia on that side - let me know if interested in surgical evaluation vs CT scan to confirm hernia.  Good to see you today  Return in 6 months for follow up visit

## 2023-06-26 ENCOUNTER — Encounter: Payer: Self-pay | Admitting: Family Medicine

## 2023-06-26 ENCOUNTER — Other Ambulatory Visit: Payer: Self-pay | Admitting: Family Medicine

## 2023-06-26 DIAGNOSIS — G629 Polyneuropathy, unspecified: Secondary | ICD-10-CM | POA: Insufficient documentation

## 2023-06-26 DIAGNOSIS — E538 Deficiency of other specified B group vitamins: Secondary | ICD-10-CM | POA: Insufficient documentation

## 2023-06-26 MED ORDER — VITAMIN B-12 1000 MCG PO TABS
1000.0000 ug | ORAL_TABLET | Freq: Every day | ORAL | Status: AC
Start: 2023-06-26 — End: ?

## 2023-06-26 NOTE — Assessment & Plan Note (Signed)
 Present for the past 6+ months.  On exam possible small hernia to left inguinal region - reproducible with cough.  No pain with rotation of femoral head in hip joint.  Offered gen surgery eval vs CT scan - he opts to monitor at this time.

## 2023-06-26 NOTE — Assessment & Plan Note (Signed)
 Obesity complicated by comorbidities of prediabetes, dyslipidemia stroke and CHF.

## 2023-06-26 NOTE — Assessment & Plan Note (Signed)
 Stable period on finasteride  and flomax .

## 2023-06-26 NOTE — Assessment & Plan Note (Signed)
 Describes R foot/shin neuropathy symptoms present since R knee replacement.  No neuropathic burning pain. Discussed medications likely not beneficial in this setting.  Further evaluate with labwork today (TSH, B12).  Offered RLE nerve conduction study for further evaluation - he declines at this time.

## 2023-06-26 NOTE — Assessment & Plan Note (Addendum)
 Stable period s/p MVR 2001 - continues aspirin  and eliquis  daily.

## 2023-06-26 NOTE — Assessment & Plan Note (Signed)
 Currently without cough.  Discussed pulm eval if recurrent issue this year.

## 2023-06-26 NOTE — Assessment & Plan Note (Addendum)
 Previously discussed.

## 2023-06-26 NOTE — Assessment & Plan Note (Signed)
 Preventative protocols reviewed and updated unless pt declined. Discussed healthy diet and lifestyle.

## 2023-06-26 NOTE — Assessment & Plan Note (Signed)
 Continues aspirin , statin, zetia  and eliquis .

## 2023-06-26 NOTE — Assessment & Plan Note (Addendum)
 Afib/aflutter after COVID vaccine this past year s/p cardioversion.  Continues carvedilol  and eliquis  through cardiology.

## 2023-06-26 NOTE — Assessment & Plan Note (Addendum)
 Chronic, stable on high dose simvastatin  + zetia . The ASCVD Risk score (Arnett DK, et al., 2019) failed to calculate for the following reasons:   Risk score cannot be calculated because patient has a medical history suggesting prior/existing ASCVD

## 2023-06-26 NOTE — Assessment & Plan Note (Addendum)
 Update A1c ?

## 2023-06-26 NOTE — Assessment & Plan Note (Signed)
 Regularly followed by cardiology.  Continues lasix  20mg  daily with benefit.

## 2023-06-26 NOTE — Assessment & Plan Note (Addendum)
 With resultant L vision loss. Cryptogenic, now on eliquis .

## 2023-06-28 ENCOUNTER — Encounter: Payer: Self-pay | Admitting: Internal Medicine

## 2023-07-12 ENCOUNTER — Other Ambulatory Visit: Payer: Self-pay | Admitting: Family Medicine

## 2023-07-12 NOTE — Telephone Encounter (Signed)
 Dose increased to 12.5 mg BID while at [facility] (see 02/23/23 visit notes). New rx sent 02/23/23, #180/3 refills to [facility].  Request denied.

## 2023-07-23 ENCOUNTER — Ambulatory Visit (INDEPENDENT_AMBULATORY_CARE_PROVIDER_SITE_OTHER): Payer: Medicare Other

## 2023-07-23 DIAGNOSIS — I63449 Cerebral infarction due to embolism of unspecified cerebellar artery: Secondary | ICD-10-CM

## 2023-07-23 LAB — CUP PACEART REMOTE DEVICE CHECK
Date Time Interrogation Session: 20250518230908
Implantable Pulse Generator Implant Date: 20210414

## 2023-07-30 ENCOUNTER — Ambulatory Visit: Payer: Self-pay | Admitting: Cardiovascular Disease

## 2023-08-08 NOTE — Progress Notes (Signed)
 [facility] Summary Report / Loop Recorder

## 2023-08-23 ENCOUNTER — Ambulatory Visit: Payer: Self-pay | Admitting: Cardiovascular Disease

## 2023-08-23 ENCOUNTER — Ambulatory Visit (INDEPENDENT_AMBULATORY_CARE_PROVIDER_SITE_OTHER)

## 2023-08-23 DIAGNOSIS — I63449 Cerebral infarction due to embolism of unspecified cerebellar artery: Secondary | ICD-10-CM

## 2023-08-23 LAB — CUP PACEART REMOTE DEVICE CHECK
Date Time Interrogation Session: 20250618230843
Implantable Pulse Generator Implant Date: 20210414

## 2023-09-12 NOTE — Progress Notes (Signed)
 [facility] Summary Report / Loop Recorder

## 2023-09-18 ENCOUNTER — Ambulatory Visit (HOSPITAL_BASED_OUTPATIENT_CLINIC_OR_DEPARTMENT_OTHER): Admitting: Cardiology

## 2023-09-18 ENCOUNTER — Encounter (HOSPITAL_BASED_OUTPATIENT_CLINIC_OR_DEPARTMENT_OTHER): Payer: Self-pay | Admitting: Cardiology

## 2023-09-18 VITALS — BP 134/74 | HR 55 | Ht 70.0 in | Wt 243.0 lb

## 2023-09-18 DIAGNOSIS — Z7901 Long term (current) use of anticoagulants: Secondary | ICD-10-CM | POA: Diagnosis not present

## 2023-09-18 DIAGNOSIS — Z9889 Other specified postprocedural states: Secondary | ICD-10-CM

## 2023-09-18 DIAGNOSIS — I5189 Other ill-defined heart diseases: Secondary | ICD-10-CM

## 2023-09-18 DIAGNOSIS — I480 Paroxysmal atrial fibrillation: Secondary | ICD-10-CM | POA: Diagnosis not present

## 2023-09-18 DIAGNOSIS — D6869 Other thrombophilia: Secondary | ICD-10-CM | POA: Diagnosis not present

## 2023-09-18 NOTE — Progress Notes (Signed)
 Cardiology Office Note:  .   Date:  09/18/2023  ID:  Alec Maldonado, DOB September 18, 1950, MRN [id number] PCP: Rilla Baller, MD  [facility] Providers Cardiologist:  Shelda Bruckner, MD {  History of Present Illness: .   Alec Maldonado is a 73 y.o. male with a hx of CVA s/p ILR, paroxysmal atrial fibrillation, prior MV repair, hyperlipidemia, pSVT who is seen for follow-up. I initially met him 07/02/2019 as a new consult at the request of Rilla Baller, MD for the evaluation and management of left ventricular hypertrophy.   Today: Underwent TEE-CV for afib in 02/2023. Overall doing well. Had some shortness of breath when hiking at elevation in [place], but otherwise no issues. Has not felt any afib, none on recent monitor interrogation. No other shortness of breath episodes, no chest pain.  Not sure if he will need a future knee surgery, but wants to make sure his heart's ok if he needs it going forward.  ROS: Denies chest pain, shortness of breath at rest or with normal exertion. No PND, orthopnea, LE edema or unexpected weight gain. No syncope or palpitations. ROS otherwise negative except as noted.   Studies Reviewed: SABRA    EKG:       Physical Exam:   VS:  BP 134/74 (BP Location: Right Arm, Patient Position: Sitting, Cuff Size: Normal)   Pulse (!) 55   Ht 5' 10 (1.778 m)   Wt 243 lb (110.2 kg)   SpO2 95%   BMI 34.87 kg/m    Wt Readings from Last 3 Encounters:  09/18/23 243 lb (110.2 kg)  06/25/23 246 lb 8 oz (111.8 kg)  06/11/23 240 lb (108.9 kg)    GEN: Well nourished, well developed in no acute distress HEENT: Normal, moist mucous membranes NECK: No JVD CARDIAC: regular rhythm, normal S1 and S2, no rubs or gallops. No murmur. VASCULAR: Radial and DP pulses 2+ bilaterally. No carotid bruits RESPIRATORY:  Clear to auscultation without rales, wheezing or rhonchi  ABDOMEN: Soft, non-tender, non-distended MUSCULOSKELETAL:  Ambulates independently SKIN: Warm and  dry, no edema NEUROLOGIC:  Alert and oriented x 3. No focal neuro deficits noted. PSYCHIATRIC:  Normal affect    ASSESSMENT AND PLAN: .    Systolic dysfunction on echo while in afib Prior Left ventricular hypertrophy: -cMRI without scar or evidence of infiltrative disease -rechecking echo given mildly reduced EF on TEE, see below. Ruling out cardiomyopathy.   History of mitral valve repair -no recent unexplained symptoms  Paroxysmal SVT Atrial fibrillation, paroxysmal Cryptogenic stroke: -followed by Dr. Fernande. S/P TEE-CV 02/2023 -CHA2DS2/VAS Stroke Risk Points=5  -LINQ loop recorder in place  -on carvedilol  12.5 mg BID -on apixaban  5 mg BID -while in afib, TEE showed EF 40-45%. Repeat now that he has been in sinus   Type II diabetes Hyperlipidemia -managing diabetes with lifestyle -LDL goal <70, continue simvastatin  and ezetimibe . Last LDL 61 06/2023  CV risk counseling and prevention -recommend heart healthy/Mediterranean diet, with whole grains, fruits, vegetable, fish, lean meats, nuts, and olive oil. Limit salt. -recommend moderate walking, 3-5 times/week for 30-50 minutes each session. Aim for at least 150 minutes.week. Goal should be pace of 3 miles/hours, or walking 1.5 miles in 30 minutes -recommend avoidance of tobacco products. Avoid excess alcohol.  Dispo: 1 year or sooner as needed, if echo unremarkable  Signed, Shelda Bruckner, MD   Shelda Bruckner, MD, PhD, [facility]  [facility]  [place]  [facility] at [facility]  at [facility] [address] [place], [place] [address] [phone]

## 2023-09-18 NOTE — Patient Instructions (Signed)
 Medication Instructions:   Your physician recommends that you continue on your current medications as directed. Please refer to the Current Medication list given to you today.   *If you need a refill on your cardiac medications before your next appointment, please call your pharmacy*  Lab Work:  None ordered.  If you have labs (blood work) drawn today and your tests are completely normal, you will receive your results only by: MyChart Message (if you have MyChart) OR A paper copy in the mail If you have any lab test that is abnormal or we need to change your treatment, we will call you to review the results.  Testing/Procedures:  Your physician has requested that you have an echocardiogram. Echocardiography is a painless test that uses sound waves to create images of your heart. It provides your doctor with information about the size and shape of your heart and how well your heart's chambers and valves are working. This procedure takes approximately one hour. There are no restrictions for this procedure. Please do NOT wear cologne, perfume, aftershave or lotions (deodorant is allowed). Please arrive 15 minutes prior to your appointment time.  Follow-Up: At [facility], you and your health needs are our priority.  As part of our continuing mission to provide you with exceptional heart care, our providers are all part of one team.  This team includes your primary Cardiologist (physician) and Advanced Practice Providers or APPs (Physician Assistants and Nurse Practitioners) who all work together to provide you with the care you need, when you need it.  Your next appointment:   1 year(s)  Provider:   Shelda Bruckner, MD, Rosaline Bane, NP, or Reche Finder, NP    We recommend signing up for the patient portal called MyChart.  Sign up information is provided on this After Visit Summary.  MyChart is used to connect with patients for Virtual Visits (Telemedicine).   Patients are able to view lab/test results, encounter notes, upcoming appointments, etc.  Non-urgent messages can be sent to your provider as well.   To learn more about what you can do with MyChart, go to [email or web address].   Other Instructions  Your physician wants you to follow-up in: 1 year.  You will receive a reminder letter in the mail two months in advance. If you don't receive a letter, please call our office to schedule the follow-up appointment.

## 2023-09-24 ENCOUNTER — Ambulatory Visit

## 2023-09-24 DIAGNOSIS — I63449 Cerebral infarction due to embolism of unspecified cerebellar artery: Secondary | ICD-10-CM

## 2023-09-25 LAB — CUP PACEART REMOTE DEVICE CHECK
Date Time Interrogation Session: 20250720230454
Implantable Pulse Generator Implant Date: 20210414

## 2023-09-30 ENCOUNTER — Ambulatory Visit: Payer: Self-pay | Admitting: Cardiovascular Disease

## 2023-10-08 DIAGNOSIS — L905 Scar conditions and fibrosis of skin: Secondary | ICD-10-CM | POA: Diagnosis not present

## 2023-10-08 DIAGNOSIS — C44519 Basal cell carcinoma of skin of other part of trunk: Secondary | ICD-10-CM | POA: Diagnosis not present

## 2023-10-08 DIAGNOSIS — L821 Other seborrheic keratosis: Secondary | ICD-10-CM | POA: Diagnosis not present

## 2023-10-08 DIAGNOSIS — D2272 Melanocytic nevi of left lower limb, including hip: Secondary | ICD-10-CM | POA: Diagnosis not present

## 2023-10-08 DIAGNOSIS — D225 Melanocytic nevi of trunk: Secondary | ICD-10-CM | POA: Diagnosis not present

## 2023-10-08 DIAGNOSIS — Z85828 Personal history of other malignant neoplasm of skin: Secondary | ICD-10-CM | POA: Diagnosis not present

## 2023-10-08 DIAGNOSIS — D2271 Melanocytic nevi of right lower limb, including hip: Secondary | ICD-10-CM | POA: Diagnosis not present

## 2023-10-15 ENCOUNTER — Encounter: Payer: Self-pay | Admitting: Family Medicine

## 2023-10-22 NOTE — Progress Notes (Signed)
 [facility] Summary Report / Loop Recorder

## 2023-10-24 ENCOUNTER — Ambulatory Visit (HOSPITAL_COMMUNITY)
Admission: RE | Admit: 2023-10-24 | Discharge: 2023-10-24 | Disposition: A | Discharge status: Home / Self Care | Source: Ambulatory Visit | Attending: Cardiovascular Disease | Admitting: Cardiovascular Disease

## 2023-10-24 DIAGNOSIS — Z9889 Other specified postprocedural states: Secondary | ICD-10-CM | POA: Insufficient documentation

## 2023-10-24 DIAGNOSIS — I5189 Other ill-defined heart diseases: Secondary | ICD-10-CM

## 2023-10-24 DIAGNOSIS — I480 Paroxysmal atrial fibrillation: Secondary | ICD-10-CM

## 2023-10-24 LAB — ECHOCARDIOGRAM COMPLETE
Area-P 1/2: 1.32 cm2
Est EF: 50
MV VTI: 1.39 cm2
P 1/2 time: 813 ms
S' Lateral: 4.5 cm

## 2023-10-25 ENCOUNTER — Ambulatory Visit (INDEPENDENT_AMBULATORY_CARE_PROVIDER_SITE_OTHER)

## 2023-10-25 DIAGNOSIS — I63449 Cerebral infarction due to embolism of unspecified cerebellar artery: Secondary | ICD-10-CM | POA: Diagnosis not present

## 2023-10-25 LAB — CUP PACEART REMOTE DEVICE CHECK
Date Time Interrogation Session: 20250820230643
Implantable Pulse Generator Implant Date: 20210414

## 2023-10-29 ENCOUNTER — Ambulatory Visit: Payer: Self-pay | Admitting: Cardiovascular Disease

## 2023-11-19 ENCOUNTER — Ambulatory Visit (HOSPITAL_BASED_OUTPATIENT_CLINIC_OR_DEPARTMENT_OTHER): Payer: Self-pay | Admitting: Cardiology

## 2023-11-26 ENCOUNTER — Ambulatory Visit (INDEPENDENT_AMBULATORY_CARE_PROVIDER_SITE_OTHER)

## 2023-11-26 DIAGNOSIS — I63449 Cerebral infarction due to embolism of unspecified cerebellar artery: Secondary | ICD-10-CM | POA: Diagnosis not present

## 2023-11-26 LAB — CUP PACEART REMOTE DEVICE CHECK
Date Time Interrogation Session: 20250921230208
Implantable Pulse Generator Implant Date: 20210414

## 2023-11-27 NOTE — Progress Notes (Signed)
 Remote Loop Recorder Transmission

## 2023-11-28 NOTE — Progress Notes (Signed)
 Remote Loop Recorder Transmission

## 2023-12-06 ENCOUNTER — Other Ambulatory Visit: Payer: Self-pay

## 2023-12-06 MED ORDER — APIXABAN 5 MG PO TABS: 5.0000 mg | ORAL_TABLET | Freq: Two times a day (BID) | ORAL | 1 refills | Status: AC

## 2023-12-06 NOTE — Telephone Encounter (Signed)
 Prescription refill request for Eliquis  received. Indication:afib Last office visit:7/25 Scr:1.07  4/25 Age: 73 Weight:110.2  kg  Prescription refilled

## 2023-12-10 NOTE — Progress Notes (Signed)
 Remote Loop Recorder Transmission

## 2023-12-11 ENCOUNTER — Ambulatory Visit: Payer: Self-pay | Admitting: Cardiovascular Disease

## 2023-12-27 ENCOUNTER — Ambulatory Visit

## 2023-12-27 ENCOUNTER — Encounter

## 2023-12-27 DIAGNOSIS — I63449 Cerebral infarction due to embolism of unspecified cerebellar artery: Secondary | ICD-10-CM | POA: Diagnosis not present

## 2023-12-27 LAB — CUP PACEART REMOTE DEVICE CHECK
Date Time Interrogation Session: 20251022230431
Implantable Pulse Generator Implant Date: 20210414

## 2023-12-28 NOTE — Progress Notes (Signed)
 Remote Loop Recorder Transmission

## 2024-01-09 ENCOUNTER — Ambulatory Visit: Payer: Self-pay | Admitting: Cardiovascular Disease

## 2024-01-27 ENCOUNTER — Ambulatory Visit

## 2024-01-28 ENCOUNTER — Encounter

## 2024-01-29 ENCOUNTER — Ambulatory Visit

## 2024-01-29 DIAGNOSIS — I63449 Cerebral infarction due to embolism of unspecified cerebellar artery: Secondary | ICD-10-CM

## 2024-01-30 LAB — CUP PACEART REMOTE DEVICE CHECK
Date Time Interrogation Session: 20251124230303
Implantable Pulse Generator Implant Date: 20210414

## 2024-02-01 NOTE — Progress Notes (Signed)
 Remote Loop Recorder Transmission

## 2024-02-05 ENCOUNTER — Ambulatory Visit: Payer: Self-pay | Admitting: Cardiovascular Disease

## 2024-02-06 ENCOUNTER — Ambulatory Visit: Admitting: Orthopaedic Surgery

## 2024-02-06 ENCOUNTER — Other Ambulatory Visit

## 2024-02-06 ENCOUNTER — Encounter: Payer: Self-pay | Admitting: Orthopaedic Surgery

## 2024-02-06 VITALS — Ht 70.0 in | Wt 248.0 lb

## 2024-02-06 DIAGNOSIS — M25562 Pain in left knee: Secondary | ICD-10-CM

## 2024-02-06 DIAGNOSIS — G8929 Other chronic pain: Secondary | ICD-10-CM

## 2024-02-06 MED ORDER — METHYLPREDNISOLONE ACETATE 40 MG/ML IJ SUSP
40.0000 mg | INTRAMUSCULAR | Status: AC | PRN
  Administered 2024-02-06: 40 mg via INTRA_ARTICULAR

## 2024-02-06 MED ORDER — LIDOCAINE HCL 1 % IJ SOLN
3.0000 mL | INTRAMUSCULAR | Status: AC | PRN
  Administered 2024-02-06: 3 mL

## 2024-02-06 NOTE — Progress Notes (Signed)
 The patient is a 73 year old who I am seeing for left knee pain today.  He has a right knee replacement that was done in 2022 by Dr. Anderson.  He says that knee is always hurt him since surgery.  He also does have some low back pain and he did report shinsplints on the right lower leg and numbness and tingling in his right foot.  He is a diabetic but under excellent control.  His left knee is been hurting for some time now and it pops and cracks with some swelling.  Examination of his left knee today does show varus malalignment that is correctable.  There is no effusion today but there is definitely medial joint line tenderness and patellofemoral crepitation to the arc of motion of the knee.  His right knee is slightly swollen but has good range of motion and is ligamentously stable.  His hip exam on the right side is normal but he does have a positive straight leg raise.  2 views of the left knee show severe end-stage arthritis with bone-on-bone wear of the medial compartment and patellofemoral joint with varus malalignment and osteophytes in all 3 compartments.  Per his request we did place a steroid injection in his left knee today to get him through the holidays.  Will see him back after the first of the year to see how his left knee is doing and to talk further about knee replacement surgery.  At that visit we do need a standing AP and lateral of his lumbar spine given his sciatic type of symptoms and radicular symptoms going down the right leg.    Procedure Note  Patient: Alec Maldonado             Date of Birth: [birth year]           MRN: [id number]             Visit Date: 02/06/2024  Procedures: Visit Diagnoses:  1. Chronic pain of left knee     Large Joint Inj: L knee on 02/06/2024 10:48 AM Indications: diagnostic evaluation and pain Details: 22 G 1.5 in needle, superolateral approach  Arthrogram: No  Medications: 3 mL lidocaine  1 %; 40 mg methylPREDNISolone  acetate 40  MG/ML Outcome: tolerated well, no immediate complications Procedure, treatment alternatives, risks and benefits explained, specific risks discussed. Consent was given by the patient. Immediately prior to procedure a time out was called to verify the correct patient, procedure, equipment, support staff and site/side marked as required. Patient was prepped and draped in the usual sterile fashion.

## 2024-02-15 ENCOUNTER — Ambulatory Visit: Payer: Self-pay

## 2024-02-15 NOTE — Telephone Encounter (Signed)
° °  FYI Only or Action Required?: FYI only for provider: appointment scheduled on 02/19/2024.  Patient was last seen in primary care on 06/25/2023 by Rilla Baller, MD.  Called Nurse Triage reporting Foot Pain.  Symptoms began several months ago.  Interventions attempted: Nothing.  Symptoms are: gradually worsening.  Triage Disposition: See PCP When Office is Open (Within 3 Days)  Patient/caregiver understands and will follow disposition?: Yes  Copied from CRM #[id number]. Topic: Clinical - Red Word Triage >> Feb 15, 2024 12:23 PM Drema MATSU wrote: Red Word that prompted transfer to Nurse Triage: Patient has pain in his left foot. He states that it is kind of hard to walk on it.  Pt did not want to continue to hold.  ----------------------------------------------------------------------- From previous Reason for Contact - Scheduling: Patient/patient representative is calling to schedule an appointment. Refer to attachments for appointment information. Reason for Disposition  [1] MODERATE pain (e.g., interferes with normal activities, limping) AND [2] present > 3 days  Answer Assessment - Initial Assessment Questions 1. ONSET: When did the pain start?      3-4 months ago, worsened over the last month 2. LOCATION: Where is the pain located?      Left foot, outside of foot at the arch and toward the ball of little toe 3. PAIN: How bad is the pain?    (Scale 1-10; or mild, moderate, severe)     5/10 when walking.  Worse walking bare foot than with shyoes 4. WORK OR EXERCISE: Has there been any recent work or exercise that involved this part of the body?      denies 5. CAUSE: What do you think is causing the foot pain?     unsure 6. OTHER SYMPTOMS: Do you have any other symptoms? (e.g., leg pain, rash, fever, numbness)     denies  Protocols used: Foot Pain-A-AH

## 2024-02-15 NOTE — Telephone Encounter (Signed)
 Next Appt With Family Medicine Frost Solian, MD) 02/19/2024 at 10:30 AM

## 2024-02-15 NOTE — Telephone Encounter (Signed)
 Noted. Appreciate Dr Cleatus seeing this patient next week.

## 2024-02-17 NOTE — Telephone Encounter (Signed)
Noted.  Will see at OV.  

## 2024-02-19 ENCOUNTER — Ambulatory Visit: Admitting: Family Medicine

## 2024-02-19 ENCOUNTER — Ambulatory Visit (INDEPENDENT_AMBULATORY_CARE_PROVIDER_SITE_OTHER)
Admission: RE | Admit: 2024-02-19 | Discharge: 2024-02-19 | Disposition: A | Discharge status: Home / Self Care | Source: Ambulatory Visit | Attending: Family Medicine | Admitting: Family Medicine

## 2024-02-19 ENCOUNTER — Encounter: Payer: Self-pay | Admitting: Family Medicine

## 2024-02-19 VITALS — BP 120/64 | HR 58 | Temp 97.6°F | Ht 70.0 in | Wt 249.1 lb

## 2024-02-19 DIAGNOSIS — M79672 Pain in left foot: Secondary | ICD-10-CM

## 2024-02-19 NOTE — Progress Notes (Unsigned)
 L lateral plantar foot pain, proximal to the L 5th toe.  Pain isn't constant but mainly walking.  Gait altered.  No trauma.  No bruising, no swelling known.  No falls.  More pain barefoot.  Hasn't tried new inserts.   Pain along distal plantar 5th MT.  Very small callous locally, not ulcerated.  Normal sensation and DP pulse.  Skin well perfused. Foot not ttp o/w Toes not ttp.  Med and lat mal not ttp Achilles and plantar fascia not ttp  Normal tread wear pattern on shoes.   No fx seen on xray.  D/w pt.

## 2024-02-19 NOTE — Patient Instructions (Signed)
 Try full length soft arch support inserts and see if that helps.   If not, then let us  know.  Take care.  Glad to see you.

## 2024-02-20 DIAGNOSIS — M79672 Pain in left foot: Secondary | ICD-10-CM | POA: Insufficient documentation

## 2024-02-20 NOTE — Assessment & Plan Note (Signed)
 Plain films reviewed without acute findings.  Await overread. He could have distal metatarsalgia from relative lack of arch support. I asked him to try full length soft arch support inserts and see if that helps.   If not, then let us  know.  He agrees to plan.  This does not look typical for gout or any other acute process.  He does not have any bruising or inflammatory changes on exam.

## 2024-02-28 ENCOUNTER — Encounter

## 2024-02-29 ENCOUNTER — Ambulatory Visit

## 2024-02-29 DIAGNOSIS — I63449 Cerebral infarction due to embolism of unspecified cerebellar artery: Secondary | ICD-10-CM

## 2024-03-02 LAB — CUP PACEART REMOTE DEVICE CHECK
Date Time Interrogation Session: 20251225230309
Implantable Pulse Generator Implant Date: 20210414

## 2024-03-03 NOTE — Progress Notes (Signed)
 Remote Loop Recorder Transmission

## 2024-03-05 ENCOUNTER — Ambulatory Visit: Payer: Self-pay | Admitting: Cardiovascular Disease

## 2024-03-09 ENCOUNTER — Ambulatory Visit: Payer: Self-pay | Admitting: Family Medicine

## 2024-03-10 ENCOUNTER — Ambulatory Visit: Payer: Self-pay

## 2024-03-10 NOTE — Telephone Encounter (Signed)
 FYI Only or Action Required?: FYI only for provider: appointment scheduled on 03/17/2024.  Patient was last seen in primary care on 02/19/2024 by Cleatus Arlyss RAMAN, MD.  Called Nurse Triage reporting Foot Pain.  Symptoms began several months ago.  Interventions attempted: OTC medications: Tylneol.  Symptoms are: gradually worsening.  Triage Disposition: See PCP Within 2 Weeks  Patient/caregiver understands and will follow disposition?: Yes      Copied from CRM [id number]. Topic: Clinical - Red Word Triage >> Mar 10, 2024 11:42 AM Larissa RAMAN wrote: Red Word that prompted transfer to Nurse Triage: Lt foot pain- worsening Reason for Disposition  Foot pain is a chronic symptom (recurrent or ongoing AND present > 4 weeks)  Answer Assessment - Initial Assessment Questions Pt was last seen for this foot pain by Dr. Cleatus on 12/16. Pt was told if the pain continues to see Dr. Rilla for next appointment. This RN scheduled pt an appointment with Dr. Rilla on 1/12. This RN educated pt on new-worsening symptoms and when to call back. Pt verbalized understanding and agrees to plan.    Left foot pain Some swelling or knot on the side of it Onset: summertime, worsening Pain 4-6/10 depending on how much he walks on it Denies fever  Protocols used: Foot Pain-A-AH

## 2024-03-10 NOTE — Telephone Encounter (Signed)
 Noted. Would offer podiatry referral if he'd like otherwise he can keep appt next week.

## 2024-03-11 NOTE — Telephone Encounter (Signed)
 Pt declined refferal at this time and stated he would rather keep his appt and talk with Dr. KANDICE on the 12th

## 2024-03-17 ENCOUNTER — Ambulatory Visit (INDEPENDENT_AMBULATORY_CARE_PROVIDER_SITE_OTHER): Admitting: Family Medicine

## 2024-03-17 ENCOUNTER — Other Ambulatory Visit (HOSPITAL_COMMUNITY): Payer: Self-pay

## 2024-03-17 ENCOUNTER — Encounter: Payer: Self-pay | Admitting: Family Medicine

## 2024-03-17 VITALS — BP 124/80 | HR 54 | Temp 98.1°F | Wt 253.4 lb

## 2024-03-17 DIAGNOSIS — I5020 Unspecified systolic (congestive) heart failure: Secondary | ICD-10-CM | POA: Diagnosis not present

## 2024-03-17 DIAGNOSIS — L84 Corns and callosities: Secondary | ICD-10-CM | POA: Diagnosis not present

## 2024-03-17 DIAGNOSIS — M79672 Pain in left foot: Secondary | ICD-10-CM

## 2024-03-17 MED ORDER — WEGOVY 0.25 MG/0.5ML ~~LOC~~ SOAJ: 0.2500 mg | SUBCUTANEOUS | 0 refills | Status: AC

## 2024-03-17 MED ORDER — WEGOVY 0.5 MG/0.5ML ~~LOC~~ SOAJ: 0.5000 mg | SUBCUTANEOUS | 0 refills | Status: AC

## 2024-03-17 NOTE — Assessment & Plan Note (Signed)
 H/o this, latest EF 50%.  Sees cardiology, on daily lasix .

## 2024-03-17 NOTE — Assessment & Plan Note (Signed)
 Obesity complicated by prediabetes, dyslipidemia, stroke history and CHF history  Patient is interested in GLP1RA. Reviewed mechanism of action of medication as well as side effects and adverse events to watch for including nausea, diarrhea, constipation, pancreatitis. No fmhx medullary thyroid  cancer or MEN2. Discussed titration schedule for medication. Will start wegovy  0.25mg  weekly with titration to 0.5mg  weekly if tolerated. Discussed weekly injection wegovy  through [facility] cash pay manufacturer pharmacy if insurance does not approve, discussed oral wegovy  pill option (still unavailable through our EMR). Discussed need for regular visits for weight management to monitor medication effect and tolerance and weight loss, rec return 1 month after starting medication.

## 2024-03-17 NOTE — Progress Notes (Signed)
 " Ph: [phone] Fax: [phone]   Patient ID: Alec Maldonado, male    DOB: Apr 10, 1950, 74 y.o.   MRN: [id number]  This visit was conducted in person.  BP 124/80 (BP Location: Right Arm, Patient Position: Sitting, Cuff Size: Large)   Pulse (!) 54   Temp 98.1 F (36.7 C) (Oral)   Wt 253 lb 6.4 oz (114.9 kg)   SpO2 96%   BMI 36.36 kg/m    CC: chronic left foot pain /swelling  Subjective:   HPI: Alec Maldonado is a 74 y.o. male presenting on 03/17/2024 for Acute Visit (Chronic Left foot pain/Some swelling or knot on the side of it/Onset: summertime, worsening last 5 weeks/Pain 4 only when he walks on it, worsening the longer he walks/)   6+ months of left foot pain and swelling, acutely worse over the past 5 weeks  Tried full length Dr Heriberto arch support insoles without significant benefit.   He's tried callus /corn pad without much benefit.   Xray last month as per below: LEFT FOOT - COMPLETE 3+ VIEW  COMPARISON:  None Available. FINDINGS: No fracture or malalignment. Mild degenerative change at the first MTP joint. Mild soft tissue swelling at the fifth MTP joint. IMPRESSION: No acute osseous abnormality. Mild soft tissue swelling at the fifth MTP joint. Electronically Signed   By: Luke Bun M.D.   On: 02/28/2024 23:42  Obesity - he is interested in trial of weight loss medication. No known diabetes or sleep apnea but he does have h/o CHF with EF increasing from 40 to 50% on latest echo 10/2023.      Relevant past medical, surgical, family and social history reviewed and updated as indicated. Interim medical history since our last visit reviewed. Allergies and medications reviewed and updated. Outpatient Medications Prior to Visit  Medication Sig Dispense Refill   acetaminophen  (TYLENOL ) 500 MG tablet Take 1,000 mg by mouth every 8 (eight) hours as needed for moderate pain (pain score 4-6).     apixaban  (ELIQUIS ) 5 MG TABS tablet Take 1 tablet (5 mg total) by  mouth 2 (two) times daily. 180 tablet 1   Biotin 5000 MCG TABS Take 5,000 mcg by mouth daily.     butalbital -acetaminophen -caffeine  (FIORICET) 50-325-40 MG tablet Take 1 tablet by mouth 3 (three) times daily as needed for migraine. 20 tablet 0   carvedilol  (COREG ) 12.5 MG tablet Take 1 tablet (12.5 mg total) by mouth 2 (two) times daily with a meal. 180 tablet 3   cyanocobalamin  (VITAMIN B12) 1000 MCG tablet Take 1 tablet (1,000 mcg total) by mouth daily.     ezetimibe  (ZETIA ) 10 MG tablet TAKE 1 TABLET(10 MG) BY MOUTH DAILY 90 tablet 4   finasteride  (PROSCAR ) 5 MG tablet Take 1 tablet (5 mg total) by mouth daily. 90 tablet 4   furosemide  (LASIX ) 20 MG tablet Take 1 tablet (20 mg total) by mouth daily. 90 tablet 4   Omega-3 Fatty Acids (FISH OIL) 1200 MG CAPS Take 1,200 mg by mouth daily.     simvastatin  (ZOCOR ) 80 MG tablet Take 1 tablet (80 mg total) by mouth daily at 6 PM. 90 tablet 4   tamsulosin  (FLOMAX ) 0.4 MG CAPS capsule Take 2 capsules (0.8 mg total) by mouth at bedtime. 180 capsule 4   No facility-administered medications prior to visit.     Per HPI unless specifically indicated in ROS section below Review of Systems  Objective:  BP 124/80 (BP Location: Right Arm, Patient  Position: Sitting, Cuff Size: Large)   Pulse (!) 54   Temp 98.1 F (36.7 C) (Oral)   Wt 253 lb 6.4 oz (114.9 kg)   SpO2 96%   BMI 36.36 kg/m   Wt Readings from Last 3 Encounters:  03/17/24 253 lb 6.4 oz (114.9 kg)  02/19/24 249 lb 2 oz (113 kg)  02/06/24 248 lb (112.5 kg)      Physical Exam Vitals and nursing note reviewed.  Constitutional:      Appearance: Normal appearance. He is not ill-appearing.  Musculoskeletal:        General: Tenderness present.     Right lower leg: No edema.     Left lower leg: No edema.  Skin:    General: Skin is warm and dry.     Findings: Lesion present. No rash.     Comments: Point tender to left lateral distal sole lateral to 5th MTPJ with evident hyperkeratotic  lesion with dark base   Neurological:     Mental Status: He is alert.    Diagnosis: clavus, r/o plantar wart - Location: left foot lateral sole Procedure: Paring of lesion Informed consent:  Discussed the risks (permanent scarring, light or dark discoloration, infection, pain, bleeding, bruising, redness, blister formation, and recurrence of the lesion) and the benefits of the procedure, as well as the alternatives.  Informed consent was obtained and scanned into chart. Anesthesia: none The area was prepared and draped in a standard fashion. A sharp blade was used to remove the hyperkeratotic portion of the lesion. The patient tolerated the procedure well.     Results for orders placed or performed in visit on 02/29/24  CUP PACEART REMOTE DEVICE CHECK   Collection Time: 02/28/24 11:03 PM  Result Value Ref Range   Date Time Interrogation Session [id number]    Pulse Generator Manufacturer [employer]    Pulse Gen Model [id number] GARR HEATH    Pulse Gen Serial Number [id number]    Clinic Name [facility]    Implantable Pulse Generator Type ICM/ILR    Implantable Pulse Generator Implant Date [id number]     Assessment & Plan:   Problem List Items Addressed This Visit     Severe obesity (BMI 35.0-39.9) with comorbidity (HCC)   Obesity complicated by prediabetes, dyslipidemia, stroke history and CHF history  Patient is interested in [facility]. Reviewed mechanism of action of medication as well as side effects and adverse events to watch for including nausea, diarrhea, constipation, pancreatitis. No fmhx medullary thyroid  cancer or MEN2. Discussed titration schedule for medication. Will start wegovy  0.25mg  weekly with titration to 0.5mg  weekly if tolerated. Discussed weekly injection wegovy  through [facility] cash pay manufacturer pharmacy if insurance does not approve, discussed oral wegovy  pill option (still unavailable through our EMR). Discussed need for regular visits for weight management to  monitor medication effect and tolerance and weight loss, rec return 1 month after starting medication.        Relevant Medications   semaglutide -weight management (WEGOVY ) 0.25 MG/0.5ML SOAJ SQ injection   semaglutide -weight management (WEGOVY ) 0.5 MG/0.5ML SOAJ SQ injection (Start on 04/14/2024)   Corns and callus - Primary   Paring of hyperkeratotic skin performed, pt tolerated well with significant relief after procedure.  Discussed home care with callus/corn pad, discussed podiatry referral if recurrent issue.       Heart failure with recovered ejection fraction (HFrecEF) (HCC)   H/o this, latest EF 50%.  Sees cardiology, on daily lasix .       Left foot  pain   Foot pain due to corn vs plantar wart to left lateral sole lateral to 5th MTJP  Recent foot xray reviewed - reassuring.         Meds ordered this encounter  Medications   semaglutide -weight management (WEGOVY ) 0.25 MG/0.5ML SOAJ SQ injection    Sig: Inject 0.25 mg into the skin once a week.    Dispense:  2 mL    Refill:  0   semaglutide -weight management (WEGOVY ) 0.5 MG/0.5ML SOAJ SQ injection    Sig: Inject 0.5 mg into the skin once a week.    Dispense:  2 mL    Refill:  0    No orders of the defined types were placed in this encounter.   Patient Instructions  Corn vs plantar wart - area pared down today Use callus/corn pad from here on May use epsom salt soaks/ pumice stone  Let us  know if recurrent for podiatrist evaluation.   Price out Wegovy  weight loss medicine sent to local pharmacy Other options including using pharmacy through [employer]) $350-400/month or oral wegovy  pill $150/mon (still unavailable).   Follow up plan: Return if symptoms worsen or fail to improve.  Anton Blas, MD   "

## 2024-03-17 NOTE — Assessment & Plan Note (Addendum)
 Foot pain due to corn vs plantar wart to left lateral sole lateral to 5th MTJP  Recent foot xray reviewed - reassuring.

## 2024-03-17 NOTE — Patient Instructions (Signed)
 Corn vs plantar wart - area pared down today Use callus/corn pad from here on May use epsom salt soaks/ pumice stone  Let us  know if recurrent for podiatrist evaluation.   Price out Wegovy  weight loss medicine sent to local pharmacy Other options including using pharmacy through [facility]) $350-400/month or oral wegovy  pill $150/mon (still unavailable).

## 2024-03-17 NOTE — Assessment & Plan Note (Signed)
 Paring of hyperkeratotic skin performed, pt tolerated well with significant relief after procedure.  Discussed home care with callus/corn pad, discussed podiatry referral if recurrent issue.

## 2024-03-18 ENCOUNTER — Other Ambulatory Visit (HOSPITAL_COMMUNITY): Payer: Self-pay

## 2024-03-18 ENCOUNTER — Telehealth: Payer: Self-pay

## 2024-03-18 NOTE — Telephone Encounter (Signed)
 Pharmacy Patient Advocate Encounter   Received notification from Pt Calls Messages that prior authorization for Wegovy  0.25mg /0.49ml is required/requested.   Insurance verification completed.   The patient is insured through [facility].   Per test claim: PA required; PA submitted to above mentioned insurance via Latent Key/confirmation #/EOC [id number] Status is pending

## 2024-03-18 NOTE — Telephone Encounter (Signed)
 Pharmacy Patient Advocate Encounter  Received notification from [employer] that Prior Authorization for Wegovy  0.25mg /0.54ml has been APPROVED from 03/18/24 to 03/05/25   PA #/Case ID/Reference #: [id number]

## 2024-03-19 ENCOUNTER — Other Ambulatory Visit (INDEPENDENT_AMBULATORY_CARE_PROVIDER_SITE_OTHER)

## 2024-03-19 ENCOUNTER — Ambulatory Visit: Admitting: Orthopaedic Surgery

## 2024-03-19 ENCOUNTER — Encounter: Payer: Self-pay | Admitting: Orthopaedic Surgery

## 2024-03-19 DIAGNOSIS — M5441 Lumbago with sciatica, right side: Secondary | ICD-10-CM | POA: Diagnosis not present

## 2024-03-19 DIAGNOSIS — G8929 Other chronic pain: Secondary | ICD-10-CM | POA: Diagnosis not present

## 2024-03-19 DIAGNOSIS — M1712 Unilateral primary osteoarthritis, left knee: Secondary | ICD-10-CM | POA: Diagnosis not present

## 2024-03-19 DIAGNOSIS — M25562 Pain in left knee: Secondary | ICD-10-CM

## 2024-03-19 NOTE — Telephone Encounter (Signed)
 Mychart message sent to patient.

## 2024-03-19 NOTE — Progress Notes (Signed)
 The patient is well-known to us .  He has debilitating anterior arthritis of his left knee and we did see him back in December and an injection only helped temporize his pain.  He does have a history of a right knee replacement.  He is 74 years old.  The right knee has done well.  That was done several years ago by Dr. Anderson.  We are seeing the patient today to discuss knee replacement surgery as well as to see what is going on with his lumbar spine.  He reports right sided radicular symptoms with numbness and tingling in his toes that is stayed persistent.  He said years ago back in 2019 he did have epidural steroid injection of the right side.  He sees his primary care physician regularly and he is stable otherwise.  He does take Eliquis  which he can come off for surgery.  I did review his past medical history and medications within epic.  On exam his left knee is varus malalignment with bone-on-bone wear of the medial compartment and patellofemoral joint with pain throughout the arc of motion of the knee.  He said the steroid injection did not help enough and he is ready to proceed with knee replacement surgery.  Examination his right side shows some weakness with dorsiflexion of his right foot and toes on the right side with numbness and tingling.  X-rays of the left knee from his last visit show bone-on-bone wear of the medial compartment and patellofemoral joint with osteophytes in all 3 compartments and varus malalignment.  Plain films today of the lumbar spine show degenerative changes between L5-S1 and L4 and L5.  There is stenosis at those levels as well.  This point a MRI of his lumbar spine is warranted to rule out nerve compression and to assess the degree of stenosis to see if there is any intervention such as an epidural steroid that will help.  We will work on scheduling for knee replacement surgery as well.  He knows that he will need to be off Eliquis  for 3 full days prior to that surgery.   Having had this before he is fully aware of the risk and benefits of surgery and what to expect from an intraoperative and postoperative standpoint.  He has now tried and failed all forms of conservative treatment for his severe bone-on-bone arthritis of his left knee.  We will be in touch with scheduling him for a MRI of his lumbar spine and for his left knee replacement.

## 2024-03-19 NOTE — Addendum Note (Signed)
 Addended by: Megahn Killings L on: 03/19/2024 09:26 AM   Modules accepted: Orders

## 2024-03-31 ENCOUNTER — Ambulatory Visit: Attending: Cardiovascular Disease

## 2024-03-31 DIAGNOSIS — I63449 Cerebral infarction due to embolism of unspecified cerebellar artery: Secondary | ICD-10-CM

## 2024-03-31 LAB — CUP PACEART REMOTE DEVICE CHECK
Date Time Interrogation Session: 20260125230234
Implantable Pulse Generator Implant Date: 20210414

## 2024-04-01 ENCOUNTER — Ambulatory Visit: Payer: Self-pay | Admitting: Cardiovascular Disease

## 2024-04-01 ENCOUNTER — Other Ambulatory Visit

## 2024-04-06 ENCOUNTER — Other Ambulatory Visit

## 2024-04-08 NOTE — Progress Notes (Signed)
 Remote Loop Recorder Transmission

## 2024-04-09 ENCOUNTER — Inpatient Hospital Stay
Admission: RE | Admit: 2024-04-09 | Discharge: 2024-04-09 | Attending: Orthopaedic Surgery | Admitting: Orthopaedic Surgery

## 2024-04-09 DIAGNOSIS — G8929 Other chronic pain: Secondary | ICD-10-CM

## 2024-04-22 ENCOUNTER — Encounter: Admission: RE | Payer: Self-pay

## 2024-04-22 ENCOUNTER — Ambulatory Visit (HOSPITAL_COMMUNITY): Admission: RE | Admit: 2024-04-22 | Admitting: Orthopaedic Surgery

## 2024-05-01 ENCOUNTER — Ambulatory Visit

## 2024-05-05 ENCOUNTER — Encounter: Admitting: Orthopaedic Surgery

## 2024-06-01 ENCOUNTER — Ambulatory Visit

## 2024-06-11 ENCOUNTER — Ambulatory Visit

## 2024-06-18 ENCOUNTER — Other Ambulatory Visit

## 2024-06-25 ENCOUNTER — Encounter: Admitting: Family Medicine

## 2024-07-02 ENCOUNTER — Ambulatory Visit

## 2024-08-02 ENCOUNTER — Ambulatory Visit

## 2024-09-02 ENCOUNTER — Ambulatory Visit

## 2024-10-03 ENCOUNTER — Ambulatory Visit

## 2024-11-03 ENCOUNTER — Ambulatory Visit

## 2024-12-04 ENCOUNTER — Ambulatory Visit

## 2025-01-04 ENCOUNTER — Ambulatory Visit

## 2025-02-04 ENCOUNTER — Ambulatory Visit

## 2025-03-07 ENCOUNTER — Ambulatory Visit
